# Patient Record
Sex: Male | Born: 1940
Health system: Southern US, Community
[De-identification: ages and names within clinical notes are randomized; demographics above are authoritative.]

## PROBLEM LIST (undated history)

## (undated) DIAGNOSIS — W19XXXA Unspecified fall, initial encounter: Secondary | ICD-10-CM

## (undated) DIAGNOSIS — S065X9A Traumatic subdural hemorrhage with loss of consciousness of unspecified duration, initial encounter: Secondary | ICD-10-CM

## (undated) DIAGNOSIS — I10 Essential (primary) hypertension: Secondary | ICD-10-CM

## (undated) DIAGNOSIS — I4729 Other ventricular tachycardia: Secondary | ICD-10-CM

## (undated) DIAGNOSIS — I5043 Acute on chronic combined systolic (congestive) and diastolic (congestive) heart failure: Secondary | ICD-10-CM

## (undated) DIAGNOSIS — B957 Other staphylococcus as the cause of diseases classified elsewhere: Secondary | ICD-10-CM

## (undated) DIAGNOSIS — U071 COVID-19: Secondary | ICD-10-CM

## (undated) DIAGNOSIS — E7800 Pure hypercholesterolemia, unspecified: Secondary | ICD-10-CM

## (undated) DIAGNOSIS — R7881 Bacteremia: Secondary | ICD-10-CM

## (undated) DIAGNOSIS — I472 Ventricular tachycardia: Secondary | ICD-10-CM

## (undated) DIAGNOSIS — N281 Cyst of kidney, acquired: Secondary | ICD-10-CM

## (undated) DIAGNOSIS — E119 Type 2 diabetes mellitus without complications: Secondary | ICD-10-CM

## (undated) DIAGNOSIS — G932 Benign intracranial hypertension: Secondary | ICD-10-CM

## (undated) DIAGNOSIS — I1311 Hypertensive heart and chronic kidney disease without heart failure, with stage 5 chronic kidney disease, or end stage renal disease: Secondary | ICD-10-CM

## (undated) HISTORY — DX: Other staphylococcus as the cause of diseases classified elsewhere: B95.7

## (undated) HISTORY — DX: Acute on chronic combined systolic (congestive) and diastolic (congestive) heart failure: I50.43

## (undated) HISTORY — DX: Chronic kidney disease, stage 5: I13.11

## (undated) HISTORY — DX: Other ventricular tachycardia: I47.29

## (undated) HISTORY — DX: Bacteremia: R78.81

## (undated) HISTORY — DX: Unspecified fall, initial encounter: W19.XXXA

## (undated) HISTORY — DX: COVID-19: U07.1

## (undated) HISTORY — DX: Ventricular tachycardia: I47.2

---

## 1998-05-02 ENCOUNTER — Emergency Department (HOSPITAL_COMMUNITY): Admission: EM | Admit: 1998-05-02 | Discharge: 1998-05-02 | Payer: Self-pay | Admitting: Emergency Medicine

## 2004-08-26 ENCOUNTER — Encounter: Admission: RE | Admit: 2004-08-26 | Discharge: 2004-08-26 | Payer: Self-pay | Admitting: Internal Medicine

## 2005-10-06 ENCOUNTER — Emergency Department (HOSPITAL_COMMUNITY): Admission: EM | Admit: 2005-10-06 | Discharge: 2005-10-06 | Payer: Self-pay | Admitting: Emergency Medicine

## 2006-09-27 ENCOUNTER — Emergency Department (HOSPITAL_COMMUNITY): Admission: EM | Admit: 2006-09-27 | Discharge: 2006-09-28 | Payer: Self-pay | Admitting: Emergency Medicine

## 2007-03-20 ENCOUNTER — Ambulatory Visit: Payer: Self-pay | Admitting: Cardiovascular Disease

## 2007-03-20 ENCOUNTER — Inpatient Hospital Stay (HOSPITAL_COMMUNITY): Admission: EM | Admit: 2007-03-20 | Discharge: 2007-03-22 | Payer: Self-pay | Admitting: Emergency Medicine

## 2007-03-21 ENCOUNTER — Ambulatory Visit: Payer: Self-pay | Admitting: Cardiology

## 2007-03-21 ENCOUNTER — Encounter: Payer: Self-pay | Admitting: Cardiovascular Disease

## 2007-03-22 ENCOUNTER — Other Ambulatory Visit: Payer: Self-pay | Admitting: Cardiology

## 2007-03-30 ENCOUNTER — Ambulatory Visit: Payer: Self-pay | Admitting: Cardiology

## 2007-03-30 ENCOUNTER — Observation Stay (HOSPITAL_COMMUNITY): Admission: EM | Admit: 2007-03-30 | Discharge: 2007-04-01 | Payer: Self-pay | Admitting: Emergency Medicine

## 2007-04-07 ENCOUNTER — Encounter (HOSPITAL_COMMUNITY): Admission: RE | Admit: 2007-04-07 | Discharge: 2007-05-13 | Payer: Self-pay | Admitting: Cardiology

## 2007-04-28 ENCOUNTER — Encounter: Admission: RE | Admit: 2007-04-28 | Discharge: 2007-04-28 | Payer: Self-pay | Admitting: Urology

## 2007-04-28 ENCOUNTER — Other Ambulatory Visit: Payer: Self-pay | Admitting: Urology

## 2007-04-29 ENCOUNTER — Encounter (INDEPENDENT_AMBULATORY_CARE_PROVIDER_SITE_OTHER): Payer: Self-pay | Admitting: Gastroenterology

## 2007-04-29 ENCOUNTER — Inpatient Hospital Stay (HOSPITAL_COMMUNITY): Admission: AD | Admit: 2007-04-29 | Discharge: 2007-05-01 | Payer: Self-pay | Admitting: Gastroenterology

## 2007-05-06 ENCOUNTER — Ambulatory Visit: Payer: Self-pay | Admitting: Internal Medicine

## 2010-11-03 ENCOUNTER — Encounter: Payer: Self-pay | Admitting: Urology

## 2011-02-24 NOTE — Cardiovascular Report (Signed)
NAMEKEANAN, BALAGOT             ACCOUNT NO.:  [id number]   MEDICAL RECORD NO.:  [id number]          PATIENT TYPE:  INP   LOCATION:  6526                         FACILITY:  [facility]   PHYSICIAN:  Ethelle Lyon, MD  DATE OF BIRTH:  September 03, 1941   DATE OF PROCEDURE:  03/30/2007  DATE OF DISCHARGE:                            CARDIAC CATHETERIZATION   PROCEDURE:  Coronary angiography.   INDICATIONS:  Mr. Buice is a 70 year old gentleman who underwent  placement of a drug-eluting stent in the right posterior left  ventricular branch on January 9.  He represented last evening with chest  pain.  Pain was, however, different than his prior presenting pain.  His  cardiac enzymes were normal as was his electrocardiogram.  He was  referred for diagnostic angiography to exclude threatened subacute stent  thrombosis.   PROCEDURAL TECHNIQUE:  Informed consent was obtained.  Underwent 1%  lidocaine local anesthesia, a 5-French sheath was placed in the left  common femoral artery using modified Seldinger technique.  Diagnostic  angiography was performed using JL-4 and JR-4 catheters.  The patient  tolerated the procedure well and was transferred to the holding room in  stable condition.  Sheath will be removed there.   COMPLICATIONS:  None.   FINDINGS:  1. Left main:  Long vessel with no stenosis.  2. LAD:  Moderate-sized vessel giving rise to two diagonals.  The      ostium has a 50% stenosis by angiography.  (by intravascular      ultrasound last week it was a 30% area stenosis with a residual      area of 6.6 square millimeters).  3. Ramus intermedius:  Moderate-sized vessel with an approximately 60%      stenosis proximally.  4. Circumflex:  Moderate-sized vessel giving rise to a single      marginal.  It is angiographically normal.  5. RCA:  Tortuous, dominant vessel.  Widely patent stent in the      posterior left ventricular branch.   IMPRESSION/PLAN:  No change in his anatomy  compared with a week ago.  Widely patent drug-eluting stent in the right posterior left ventricular  branch and no change in the moderate stenosis of the LAD and ramus  intermedius.  Will discharge him home today with no change in his  medical therapy.      Ethelle Lyon, MD  Electronically Signed     WED/MEDQ  D:  03/30/2007  T:  03/30/2007  Job:  [id number]

## 2011-02-24 NOTE — H&P (Signed)
William [name]             ACCOUNT NO.:  [id number]   MEDICAL RECORD NO.:  [id number]          [name] TYPE:  INP   LOCATION:  [id number]                         FACILITY:  [facility]   PHYSICIAN:  Crista Elliot, MD     DATE OF BIRTH:  1941/01/26   DATE OF PROCEDURE:  DATE OF DISCHARGE:                    STAT - MUST CHANGE TO CORRECT WORK TYPE   PRIMARY CARE PHYSICIAN:  Dr. Willey Blade.   CARDIOLOGIST:  Dr. Jenkins Rouge with [facility].   CHIEF COMPLAINT:  Lower chest and upper abdominal pain.   William [name] is a 70 year old African-American male with a history of  diabetes, hyperlipidemia, hypertension, and coronary artery disease  status post PTCI to William posterior LV branch on March 22, 2007.  Presents  with upper epigastric/lower sternal chest pain described as a dull  aching for William last 4 hours.  William [name] states that his symptoms are  identical to his symptoms 1 week ago when he had William positive cardiac  catheterization and underwent angioplasty.  William [name] denies any  exertional component as he has not been exerting himself since William cath.  He was lying down and reading when a 6 out of 10 dull aching started in  William upper epigastrium/lower sternal area.  He took 1 nitroglycerin with  no relief.  EMS was subsequently called, and a second nitroglycerin was  taken 30 minutes later with mild improvement down to a 2 out of 10.  William  [name] denied any metallic taste, burping sensation, left chest pain,  orthopnea, paroxysmal nocturnal dyspnea, or syncope.  He states that  upon exertion he has no symptoms that are reproducible.   PAST MEDICAL HISTORY:  1. Coronary artery disease.      a.     Presumed unstable angina on March 20, 2007.  Status post heart       catheterization with a 2.5 x 16 mm Taxus stent to William posterior LV       branch with a reduction of lesion from 70% to 0.      b.     Nonobstructive disease was also noted with a 50% lesion in       William ostial  LAD that underwent IVUS showing a 34% lesion with a 6.6       mm2 area.  A 60% proximal ramus was also noted and felt that it       could be medically managed.  2. Diabetes mellitus.  3. Hypertension.  4. Mild chronic renal insufficiency with a creatinine of 1.6.  5. Hyperlipidemia.   ALLERGIES:  No known drug allergies. Adverse reactions are to glucose  making William [name] sleepy.   MEDICATIONS:  1. Plavix 75 mg p.o. daily.  2. Aspirin 325 mg p.o. daily.  3. Toprol-XL 50 mg p.o. daily.  4. Crestor 10 mg p.o. daily.  5. Benicar 20 mg p.o. daily.  6. Actos 15 mg p.o. daily.  7. Nitroglycerin p.r.n.   SOCIAL HISTORY:  William [name] lives in [place] to be near his daughter  who is dependent on him.  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  He is widowed, with his wife expiring from a  massive heart attack.  He is a lifetime nonsmoker.   FAMILY HISTORY:  Notable for mother who died of old age at age 43, and  father was killed at a young age when William [name] was only 70 year old.   REVIEW OF SYSTEMS:  William 12 review of systems were reviewed and were  negative.   PHYSICAL EXAMINATION:  VITAL SIGNS:  Temperature 98.3, pulse 78,  respiratory rate 16, blood pressure 157/86, satting 98% on room air.  GENERAL:  William [name] is awake, alert, and oriented x3, in no acute  distress.  HEENT:  Normocephalic, atraumatic.  Pupils equal, round, and reactive to  light.  Extraocular movements are intact.  NECK:  Shows no JVD but does show bilateral carotid bruits.  CARDIOVASCULAR:  Regular rhythm, normal rate.  No murmurs, rubs, or  gallops.  LUNGS:  Clear to auscultation bilaterally.  ABDOMEN:  Positive bowel sounds.  Soft, nontender, nondistended.  No  epigastric tenderness with deep palpation.  No Murphy sign.  EXTREMITIES:  Show no clubbing, cyanosis, or edema.  Right femoral  catheter site appears  well healed with no bruits.  NEURO:  His cranial nerves, II through XII, are grossly intact.  No  focal __________  or sensory deficits.  MUSCULOSKELETAL:  Demonstrates no joint deformities or effusions.  SKIN:  Demonstrates no rashes or lesions.   Chest x-ray demonstrates no acute cardiopulmonary process.  EKG  demonstrates a normal sinus rhythm with first-degree AV block and a  heart rate of 60, with left axis deviation and nonspecific T wave  abnormalities.  White count is 6.9, hemoglobin is 13.9, hematocrit is  41.2, platelet count of 350,000.  Creatinine is 1.6.  Troponin is less  than 0.05, CK is 45, MB is 1.   ASSESSMENT AND PLAN:  This is a 70 year old African-American male with  multiple risk factors and a recent percutaneous transluminal coronary  intervention 1 week ago who presents with symptoms that are not typical  for angina.  1. Acute coronary syndrome.  At this time, we will continue to rule      out William [name] for myocardial infarction.  He has received Lovenox      in William emergency room.  We will review William cardiac cath films and      evaluate William ramus lesion as to William necessity for repeat      angioplasty versus medical management and evaluation for noncardiac      causes of William [name]'s symptoms.  2. Gastroesophageal reflux disease.  William [name]'s symptoms may be GI      related, therefore William [name] will be started on Protonix, and      amylase and lipase will also be obtained to rule out any pancreatic      or bile duct diseases.  3. Diabetes.  William [name] will be continued on his Actos.  Sliding      scale insulin will be initiated.     Crista Elliot, MD  Electronically Signed    RA/MEDQ  D:  03/30/2007  T:  03/30/2007  Job:  [id number]

## 2011-02-24 NOTE — Discharge Summary (Signed)
NAMESHANNEN, TOBEN             ACCOUNT NO.:  [id number]   MEDICAL RECORD NO.:  [id number]          PATIENT TYPE:  INP   LOCATION:  [id number]                         FACILITY:  [facility]   PHYSICIAN:  [name], MD    DATE OF BIRTH:  [birth year]   DATE OF ADMISSION:  04/29/2007  DATE OF DISCHARGE:  05/01/2007                               DISCHARGE SUMMARY   ADMISSION DIAGNOSES:  1. Epigastric pain.  2. Anemia.  3. Melena.  4. Coronary artery disease status post stent placement.  5. Hypertension.  6. Diabetes.  7. Hyperlipidemia.   DISCHARGE DIAGNOSES:  1. Duodenal ulcer.  2. Coronary artery disease.  3. Hyperlipidemia.  4. Diabetes.  5. Hypertension.   HISTORY AND PHYSICAL:  Please see the original H&P for full details.   HOSPITAL COURSE:  The patient underwent an emergent EGD upon admission  to the hospital and is noted to have a 1-cm duodenal ulcer. It was also  associated with gastritis. The gastritis was biopsies and the  presumption is that the patient has H. Pylori as it gave the gross  characteristic appearance of this type of infection. The duodenal ulcer  was noted to be clean based however, at the edges it was quite friable  with minor trauma.  The patient has been on both aspirin and Plavix. The  patient tolerated the procedure well and no complications were  encountered at this time. The patient was started on Protonix b.i.d. as  well as amoxicillin 1 gram p.o. b.i.d. and erythromycin 500 mg p.o.  b.i.d. The patient also consented to undergoing a blood transfusion and  his final hemoglobin value was noted to be at 10.9 upon discharge. The  patient tolerated the blood transfusions without any difficulty and his  pain was improved.  No further evidence of any melena.  The patient was  subsequently discharged in good condition and he is to follow-up in the  office in 2 weeks.      Tory Emerald Benson Norway, MD  Electronically Signed     PDH/MEDQ  D:  05/01/2007  T:   05/02/2007  Job:  [id number]

## 2011-02-24 NOTE — Discharge Summary (Signed)
NAMEELDIN, NEIDICH             ACCOUNT NO.:  [id number]   MEDICAL RECORD NO.:  [id number]          PATIENT TYPE:  INP   LOCATION:  [id number]                         FACILITY:  [facility]   PHYSICIAN:  Ethelle Lyon, MD  DATE OF BIRTH:  12/27/1940   DATE OF ADMISSION:  03/19/2007  DATE OF DISCHARGE:  03/22/2007                               DISCHARGE SUMMARY   PRIMARY CARDIOLOGIST:  He has noted Dr. Jenkins Rouge.   PRIMARY CARE PHYSICIAN:  Dr. Willey Blade.   CHIEF COMPLAINT:  Chest pain.   DISCHARGE DIAGNOSES:  1. Coronary artery disease.      a.     Admitted for unstable angina pectoris - myocardial       infarction ruled out.      b.     Status post Taxus drug-eluting stent placement to the       posterior left ventricular branch.      c.     Residual moderate nonobstructive disease - treated       medically.  2. Preserved LV function with EF of 60%.  3. Diabetes mellitus.  Actos initiated this admission.  4. Treated hyperlipidemia.  5. Hypertension.  6. Mild chronic renal insufficiency, creatinine at discharge 1.62.   PROCEDURE:  This admission, cardiac catheterization, percutaneous  coronary intervention by Dr. Albertine Patricia March 19, 2007.  Please see his  dictated note for complete details.  As noted above, the patient  underwent TAXUS drug-eluting stent placement to the posterior left  ventricular branch.  His residual CAD included 50% ostial LAD stenosis  which was 34% by IVUS, 60% proximal stenosis of the moderate size ramus  intermedius.   HISTORY OF PRESENT ILLNESS:  William [name] is a 70 year old male patient  with a history of newly diagnosed diabetes mellitus, hypertension,  hyperlipidemia who presented to [facility] on the day of  admission with complaints of chest pain off and on for 3 days.  He ruled  out for myocardial infarction and our service was asked to consult.  He  was initially transferred to our service.  His symptoms were concerning  for  unstable angina pectoris and he was set up for cardiac  catheterization.   HOSPITAL COURSE:  The patient was noted to have an elevated creatinine  on admission.  He was hydrated.  He was set up for cardiac  catheterization.  This was done on June 9 by Dr. Albertine Patricia.  As noted  above, he underwent Taxus drug eluding stent placement of the posterior  left ventricular branch.  He tolerated procedure well and without any  complications.  On the morning of March 22, 2007, he did have some groin  oozing.  This was treated with subcutaneous lidocaine with epinephrine.  This resolved the oozing.  He did not have a left ventriculogram at the  time of his catheterization due to his renal insufficiency.  A 2-D  echocardiogram revealed normal LV function with an EF of 60%.  LV wall  thickness was mildly increased.  The patient was doing well and was felt  that he was stable enough for  discharge to home.  He is seen by the  diabetes educator.  He was also set up for outpatient cardiac rehab.  The hospital service who initially admitted him placed him on Actos for  better control of his diabetes mellitus.  The patient had stopped his  Sulfonylurea asan outpatient secondary to hypoglycemia.  He was kept on  his Benicar and Crestor.  Toprol was added to his medical regimen.  Given his TAXUS drug-eluting stent placement, he needs aspirin  indefinitely and Plavix for greater than or equal to 1 year.   LABORATORY DATA:  White count 7500, hemoglobin 14.8, hematocrit 43.6,  platelet count 303,000, INR 0.9, sodium 137, potassium 4.2, glucose 94,  BUN 16, creatinine 1.2.  Cardiac markers negative x3.  TSH 1.230, total  cholesterol 151, triglycerides 106, HDL 32, LDL 98.  A 2-D  echocardiogram as noted above.  Chest X-Ray on March 19, 2007:  No acute  cardiopulmonary findings, stable appearance of chest 2005.  Of note,  there was a stable density noted near the anterior 1st rib of the left.   DISCHARGE  MEDICATIONS:  1. Plavix 75 mg daily.  2. Aspirin 325 mg daily.  3. Toprol XL 50 mg daily.  4. Crestor 10 mg daily.  5. Benicar 20 mg daily.  6. Actos 15 mg daily.  7. Nitroglycerin p.r.n. chest pain.   DISCHARGE INSTRUCTIONS:  1. Diet: Low-fat, low-sodium diabetic diet.  2. Wound care: The patient is to call our office for groin swelling,      bleeding, bruising or fever.   ACTIVITY:  The patient is to increase activity slowly.  He may walk up  steps.  May shower.  He is to do no lifting, driving or sexual activity  for 3 days.  He can return to work on or after March 29, 2007.   FOLLOW UP:  1. The patient has been set up for a follow-up with the physician      assistant for Dr. Johnsie Cancel on June 25 at 9:45 a.m..  2. He should contact Dr. [name] for follow-up.  He will need follow-      up on his diabetes with the initiation of Actos.  He also need      follow-up on his cholesterol.  His goal LDL is now less than 70.      Adjustments in his Crestor dose versus addition of another agent      can be managed by his primary care physician who manages his      cholesterol.  Total physician PA time greater than 30 minutes spent      on discharge.      Richardson Dopp, PA-C      Ethelle Lyon, MD  Electronically Signed    SW/MEDQ  D:  03/22/2007  T:  03/23/2007  Job:  [id number]   cc:   Royetta Crochet. [name], M.D.

## 2011-02-24 NOTE — H&P (Signed)
William [name], William [name]             ACCOUNT NO.:  [id number]   MEDICAL RECORD NO.:  [id number]          PATIENT TYPE:  INP   LOCATION:  73                         FACILITY:  [facility]   PHYSICIAN:  Jana Hakim, M.D. DATE OF BIRTH:  1940/12/16   DATE OF ADMISSION:  03/19/2007  DATE OF DISCHARGE:                              HISTORY & PHYSICAL   PRIMARY CARE PHYSICIAN:  Dr. Willey Blade   CHIEF COMPLAINT:  Chest pain.   HISTORY OF PRESENT ILLNESS:  This is a 70 year old male presenting to  the emergency department with complaints of unremitting chest pain,  substernal in location.  He reports having chest pain off and on for 3  days and the pain would resolve with resting.  He reports the pain began  again this evening and was unremitting until he arrived in the emergency  department.  He reports the pain felt like heaviness in the substernal  area, was without radiation and rated the intensity as being a 5/10 at  the worst.  He denies having any associated symptoms of nausea,  vomiting, shortness of breath, diaphoresis.  He denies having headache  or syncope or dizziness associated as well.   PAST MEDICAL HISTORY:  Hypertension, hyperlipidemia.   PAST SURGICAL HISTORY:  None.   MEDICATIONS:  Crestor  20 mg one p.o. daily, Benicar 20 mg one p.o.  daily   ALLERGIES:  NO KNOWN DRUG ALLERGIES.   SOCIAL HISTORY:  The patient is a high Education officer, museum.  He is a  nonsmoker, nondrinker.   FAMILY HISTORY:  Noncontributory.   PHYSICAL EXAMINATION FINDINGS:  A 70 year old obese male in no acute  distress.  Vital Signs: Temperature 97.8, blood pressure 146/97, heart  rate 80, respirations 16, O2 saturations 97%.  HEENT: Normocephalic, atraumatic.  Pupils equally round and reactive to  light.  Extraocular muscles are intact, funduscopic benign, oropharynx  is clear.  NECK:  Supple, full range of motion.  No thyromegaly, adenopathy,  jugular venous distension.  CARDIOVASCULAR:   Regular rate and rhythm.  No murmurs, gallops or rubs.  LUNGS: Clear to auscultation bilaterally.  No rales, rhonchi or wheezes  ABDOMEN:  Positive bowel sounds, soft, nontender, nondistended.  No  hepatosplenomegaly.  No rebound, no guarding.  EXTREMITIES: Without cyanosis, clubbing or edema.  NEUROLOGIC EXAMINATION:  Alert and oriented x3.  There are no focal  deficits.   LABORATORY STUDIES:  White blood cell count 8.4, hemoglobin 16.0,  hematocrit 47.2, MCV 95.8, platelets 337, neutrophils 75% lymphocytes  17%.  Sodium 139, potassium 4.2, chloride 104, CO2 27, BUN 23,  creatinine 1.7, glucose 215.  Cardiac enzymes, myoglobin 81.5, CK-MB  1.8, troponin less than 0.05  Chest x-ray:  No acute disease process.  EKG normal sinus rhythm without  acute ST-segment changes.   ASSESSMENT:  65. A 70 year old male being admitted with substernal chest pain.  2. Hypertension.  3. Hyperglycemia  4. Hyperlipidemia.   PLAN:  The patient will be admitted to the telemetry area for cardiac  monitoring and cardiac enzymes will be performed q.8 h x3.  The patient  will be placed on DVT  and GI prophylaxis for now.  The patient will also  be placed on sliding scale insulin coverage for elevated blood sugars.  He will continue on his regular medications and nutritional education  will be ordered.      Jana Hakim, M.D.  Electronically Signed     HJ/MEDQ  D:  03/20/2007  T:  03/20/2007  Job:  [id number]   cc:   Royetta Crochet. [name], M.D.  Fax: [phone]

## 2011-02-24 NOTE — Consult Note (Signed)
NAMECAIMEN, BLECHINGER             ACCOUNT NO.:  [id number]   MEDICAL RECORD NO.:  [id number]          PATIENT TYPE:  INP   LOCATION:  6526                         FACILITY:  [facility]   PHYSICIAN:  [name], MD    DATE OF BIRTH:  [birth year]   DATE OF CONSULTATION:  03/30/2007  DATE OF DISCHARGE:                                 CONSULTATION   PRIMARY CARE PHYSICIAN:  Dr. Willey Blade.   HISTORY OF PRESENT ILLNESS:  This is a 70 year old gentleman with a past  medical history of hyperlipidemia, hypertension, diabetes, and coronary  artery disease who is status post PTCA with stent placement on March 22, 2007 and subsequent repeat cardiac catheterization for complaints of  substernal chest pain/epigastric pain.  The patient states that prior to  the first cardiac catheterization, he experienced acute onset of  abdominal pain.  He denies having any of these prior episodes in the  past.  The pain was sharp, and it was located in the epigastric region.  When the pain is at its worst there is radiation to his back.  He  subsequently underwent a cardiac catheterization as it was felt that he  may be having  cardiac ischemia.  The catheterization did reveal a 74-  70% lesion in the right coronary artery, and subsequently a Taxus stent  was placed without difficulty.  The patient did report an improvement in  his pain for one day.  However, the pain did recur at that time.  He has  been discharged, and subsequently the pain was episodic. Unfortunately  the pain did increase back to the same severity as his prior initial  cardiac stent and because of these type of concerns, a repeat cardiac  catheterization was performed on March 30, 2007.  The findings were a  patent stent and no evidence of any thrombosis within his coronary  arteries.  An amylase and lipase were ordered on the patient, and his  amylase was noted to be elevated at 200 and his lipase at 60.  He denies  having any prior  history of alcohol use and no new medications.  He was  diagnosed with diabetes approximately one year ago, and he was started  on glipizide at that time.  His blood sugars had dropped significantly,  and therefore he was taken off of the medication and subsequently is  diet-controlled.  He denies any family history of diabetes.  The pain is  not brought about by any p.o. intake, and at points it can subside on  its own.   PAST MEDICAL HISTORY/PAST SURGICAL HISTORY:  As stated above.   FAMILY HISTORY:  Noncontributory.   ALLERGIES:  No known drug allergies.   MEDICATIONS:  1. Plavix 75 mg p.o. daily.  2. Aspirin 325 mg p.o. daily.  3. Toprol XL 50 mg p.o. daily.  4. Crestor 10 mg p.o. daily.  5. Benicar 20 mg p.o. daily.  6. Actos 50 mg p.o. daily.  7. Nitroglycerin p.r.n..   SOCIAL HISTORY:  The patient is a Conservation officer, nature at [employer].  No  alcohol, tobacco or illicit  drug use.   REVIEW OF SYSTEMS:  Negative per the 12-point Review of Systems.   PHYSICAL EXAMINATION:  VITAL SIGNS:  Blood pressure 165/81, heart rate  57, temperature 98.1, respirations 17, pulse ox 97% on room air.  GENERAL:  The patient is in no acute distress, alert and oriented.  HEENT:  Normocephalic, atraumatic.  Extraocular muscles intact.  NECK:  Supple.  No lymphadenopathy.  LUNGS:  Clear to auscultation bilaterally.  CARDIOVASCULAR:  Regular rate and rhythm.  ABDOMEN:  Flat, mildly obese.  Soft, nontender, nondistended.  No  rebound or rigidity.  Positive bowel sounds.  EXTREMITIES:  No clubbing,  cyanosis or edema.   LABORATORY VALUES:  White blood cell count 6.9, hemoglobin 14.3, MCV  96.5, platelets 350.  Sodium 137, potassium 2.8, chloride 107, CO2 26,  glucose 152, BUN 17, creatinine 1.4.  Amylase 209, lipase 60.   IMPRESSION:  1. Epigastric/substernal chest pain.  2. Coronary artery disease.  After evaluation of patient in      combination with his clinical presentation and the  elevation in his      amylase and lipase, it is consistent with pancreatitis.  I am      uncertain about the cause of the pancreatitis at this time.  The      most common causes are alcohol and gallstones.  There is no      evidence of any obstruction in his biliary tract, and the patient      does not have any history of alcohol use.  One concern is that      patients who present with occult pancreatitis and a recent history      of diabetes can present with a pancreatic cancer.  Further      examination with the CT scan is required at this time.   PLAN:  To perform a CT scan of the abdomen and pelvis, and the patient  can be advanced to a clear liquid diet.      Tory Emerald Benson Norway, MD  Electronically Signed     PDH/MEDQ  D:  03/30/2007  T:  03/31/2007  Job:  [id number]   cc:   Royetta Crochet. [name], M.D.  [name], MD, [facility]

## 2011-02-24 NOTE — H&P (Signed)
William [name], William [name]             ACCOUNT NO.:  [id number]   MEDICAL RECORD NO.:  [id number]          PATIENT TYPE:  INP   LOCATION:  [id number]                         FACILITY:  [facility]   PHYSICIAN:  [name], MD    DATE OF BIRTH:  Aug 10, 1941   DATE OF ADMISSION:  04/29/2007  DATE OF DISCHARGE:  05/01/2007                              HISTORY & PHYSICAL   REASON FOR ADMISSION:  Epigastric pain, heme-positive stool and anemia.   PRIMARY CARE Tieara Flitton:  Dr. Willey Blade.   CARDIOLOGIST:  Dr. Jenkins Rouge   HISTORY OF PRESENT ILLNESS:  This is a 70 year old gentleman with known  coronary artery disease status post stenting on March 20, 2007 with  recatheterization for the presumption of an acute occlusion of his Taxus  stent, diabetes, hypertension, hyperlipidemia who is being admitted to  the hospital with findings of an acute anemia. The patient was  previously known to have a hemoglobin in the 14 range and subsequently  over the subsequent weeks since his discharge, he has noted to have an  acute drop to 8.8.  The patient complains of persistent epigastric  abdominal pain and because of his symptoms he was admitted back to the  hospital for further evaluation and treatment. Previously a GI  consultation was requested in regards to his elevation in his amylase  and a CT scan which suggested that he may have a pancreatitis. An MRI  was performed and there are thoughts that an occult pancreatic  malignancy can result in a pancreatitis; however, the MRI was negative  for any masses or any evidence of pancreatitis.  The patient states that  his melena was recent onset as it started approximately 1 week prior to  this admission.   PAST MEDICAL HISTORY:  As stated above.   PAST SURGICAL HISTORY:  As stated above.   FAMILY HISTORY:  Noncontributory.   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   REVIEW  OF SYSTEMS:  As stated above in the history of present illness,  otherwise negative per the 12 point review of systems.   MEDICATIONS:  1. Plavix 75 mg p.o. daily.  2. Aspirin 325 mg p.o. daily.  3. Toprol XL 50 mg p.o. daily.  4. Crestor 10 mg p.o. daily.  5. Benicar 20 mg p.o. daily.  6. Actos 50 mg p.o. daily.  7. Sublingual nitroglycerin p.r.n.   PHYSICAL EXAMINATION:  VITAL SIGNS:  Blood pressure is 133/65, heart  rate is 62, temperature is 97.  GENERAL:  The patient is in no acute distress.  HEENT:  Normocephalic, atraumatic.  Extraocular muscles intact.  NECK:  Supple.  No lymphadenopathy.  LUNGS:  Clear to auscultation bilaterally.  CARDIOVASCULAR:  Regular rate and rhythm.  ABDOMEN:  Flat, soft, nontender, nondistended.  It is tender in the  epigastric region.  No rebound or rigidity.  EXTREMITIES:  No clubbing, cyanosis or edema.   LABORATORY VALUES:  White blood cell count is 8.3, hemoglobin 9.0,  platelets at 422, MCV is 98.0.  Sodium 139,  potassium 3.9, chloride is  110, CO2 23, glucose 87, BUN is 13, creatinine 1.4.   IMPRESSION:  1. Epigastric pain, question anemia and heme-positive stool.  2. Coronary artery disease status post Taxus stent placement.  3. Hyperlipidemia.  4. Diabetes.  5. Hypertension.  After evaluation of the patient, it appears that the      patient has a GI source for his abdominal pains most likely and      ulcers as he is noted to have a drop in his hemoglobin associated      with heme-positive stool. The plan will be to admit the patient,      transfuse with 2 units of packed red blood cells and then perform      an emergent EGD.      Tory Emerald Benson Norway, MD  Electronically Signed     PDH/MEDQ  D:  05/01/2007  T:  05/02/2007  Job:  [id number]   cc:   Royetta Crochet. [name], M.D.  [name], MD, [facility]

## 2011-02-24 NOTE — Cardiovascular Report (Signed)
William [name], William [name]             ACCOUNT NO.:  [id number]   MEDICAL RECORD NO.:  [id number]          PATIENT TYPE:  INP   LOCATION:  [id number]                         FACILITY:  [facility]   PHYSICIAN:  Ethelle Lyon, MD  DATE OF BIRTH:  1941/01/20   DATE OF PROCEDURE:  03/21/2007  DATE OF DISCHARGE:                            CARDIAC CATHETERIZATION   PROCEDURE:  Left heart catheterization, left ventriculography, coronary  angiography, intravascular ultrasound of the LAD, drug-eluting stent  placement in the right posterior left ventricular branch, StarClose  closure of the right common femoral arteriotomy site.   INDICATION:  William [name] is a 70 year old gentleman with hypertension and  hypercholesterolemia who presents with unstable angina.  He ruled out  for myocardial infarction by serial enzymes and electrocardiograms.  He  is referred for diagnostic angiography and possible percutaneous  coronary intervention.   PROCEDURAL TECHNIQUE:  Informed consent was obtained.  Under 1%  lidocaine local anesthesia, a 5-French sheath was placed in the right  common femoral artery using the modified Seldinger technique.  Diagnostic angiography and ventriculography were performed using JL-4,  JR-4, and pigtail catheters.  These images demonstrated at least  moderate stenosis of the ostium of the left anterior descending artery  as well as a 70% stenosis of the right posterior left ventricular  branch.  We decided to proceed to intravascular ultrasound interrogation  of the LAD and treatment of the RCA.   Anticoagulation was initiated with bivalirudin.  ACT was confirmed to be  greater than 225 seconds.  Plavix 600 mg was administered.  The patient  had been preloaded with aspirin.   The sheath was upsized over a wire to 6-French.  A 6-French Q guide was  advanced over a wire and engaged in the ostium of the left main.  A  Prowater wire was advanced to the distal LAD without difficulty.  I  then  performed intravascular ultrasound using the Atlantis probe device and  automated pullback.  These images demonstrated the area of stenosis to  only be 34% with a residual area of 6.6 sqmm.  We elected to manage it  conservatively.  Guide and wire were removed.   The attention was then turned to the RCA.  A JR-4 guide was advanced  over a wire and engaged in the ostium of the RCA.  A Prowater wire was  advanced to the distal PLV without difficulty.  I directly stented the  lesion using a 2.5 x 16-mm Taxus deployed at 14 atmospheres.  I  postdilated the stent using a 2.5 x 12-mm Quantum at 18 atmospheres for  two inflations.  Final angiography demonstrated no residual stenosis, no  dissection, and TIMI 3 flow to the distal vasculature.   The arteriotomy was then closed using a StarClose device.  Complete  hemostasis was obtained.  He was then transferred to the holding room in  stable condition having tolerated the procedure well.   COMPLICATIONS:  None.   FINDINGS:  1. LV:  143/6/16.  EF 65% without regional wall motion abnormality.  2. No aortic stenosis or mitral regurgitation.  3. Left main:  Long vessel which has some mild plaquing by      intravascular ultrasound but no significant stenosis.  4. LAD:  Moderate-sized vessel giving rise to two diagonals.  The      ostium has approximately 50% stenosis by angiography.  By      intravascular ultrasound there is a 34% area of stenosis with a      residual area of 6.6 sqmm.  5. Ramus intermedius:  Moderate-sized vessel with approximately 60%      stenosis proximally.  6. Circumflex:  Moderate-sized vessel giving rise to a single      marginal.  It is angiographically normal.  7. RCA:  Tortuous, dominant vessel.  The large posterior left      ventricular branch had a 70% stenosis stented to no residual.   IMPRESSION/PLAN:  Successful percutaneous intervention on the posterior  left ventricular branch using a drug-eluting  stent.  Would recommend  aspirin indefinitely.  Plavix should be continued for a minimum of 1  year with consideration to indefinite Plavix.      Ethelle Lyon, MD  Electronically Signed     WED/MEDQ  D:  03/21/2007  T:  03/22/2007  Job:  [id number]

## 2011-02-24 NOTE — Discharge Summary (Signed)
NAMERENAULT, William             ACCOUNT NO.:  [id number]   MEDICAL RECORD NO.:  [id number]          PATIENT TYPE:  INP   LOCATION:  6526                         FACILITY:  [facility]   PHYSICIAN:  [name], MD, FACCDATE OF BIRTH:  Apr 09, 1941   DATE OF ADMISSION:  03/30/2007  DATE OF DISCHARGE:  04/01/2007                               DISCHARGE SUMMARY   PROCEDURES PERFORMED DURING HOSPITALIZATION:  1. Cardiac catheterization performed on March 30, 2007 per Dr. Albertine Patricia.      a.     Left main long vessel, no stenosis.  Left anterior       descending moderate size vessel giving rise to 2 diagonals.  The       ostium has 50% stenosis, by angiography by intravascular       ultrasound last week it was 30% area stenosis with residual area       of 6.6 square millimeters.  Ramus intermedius, moderate size       vessel with approximately 60% stenosis proximally.  Circumflex,       moderate size vessel giving rise to a single marginal that is       angiographically normal.  Right coronary artery tortuous dominant       vessel widely patent stent in the posterior left ventricular       branch.  No change in anatomy compared to 1 week prior.  Widely       patent drug-eluting stent is in the right posterior left       ventricular branch with no change in moderate stenosis of the left       anterior descending and ramus intermediate.  2. MRI of the abdomen with results pending.   CONSULTING PHYSICIAN:  Dr. Carol Ada, GI.   PRIMARY CARE PHYSICIAN:  Dr. Willey Blade.   DISCHARGE DIAGNOSES:  1. Pancreatitis.  2. Coronary artery disease, status post TAXUS stent to the right      coronary artery.  3. Noncardiac chest pain.  4. Hyperlipidemia.  5. Hypertension.  6. Diabetes.   HOSPITAL COURSE:  This is a 70 year old male with a history as stated  above who presented to [facility] Emergency Room secondary to recurrent  chest pain, which he described as sharp, located in the  epigastric  region.  The pain was worse with radiation to the back.  The patient had  cardiac catheterization on March 22, 2007 with stent placed to the right  coronary artery.  The pain reoccurred after stent was placed with  increase in intensity and same severity as prior to cardiac  catheterization and stent placement.   Patient subsequently went to cardiac catheterization lab and had cardiac  catheterization as described above by Dr. Albertine Patricia with no change in  coronary anatomy.  It was also found that the patient's amylase and  lipase were elevated with diagnosis of pancreatitis.  Dr. Benson Norway was  consulted concerning this.  An MRI was completed on April 01, 2007 prior  to discharge.  The results of MRI are pending at time of this dictation.  Dr. Benson Norway was concerned about  occult malignancy at the head of the  pancreas.  An MRI was to help determine this.   On day of discharge, patient was seen and examined by Dr. Jenkins Rouge.  Patient was found to be pain free at present and was anxious to go home.  The patient will be seen by Dr. Benson Norway prior to discharge with further  recommendations after he views MRI results and will followup with Dr.  Benson Norway as an outpatient.  The patient will continue with his current  medication regimen prior to admission and followup with Dr. Johnsie Cancel in 2-  3 months.   DISCHARGE LABS:  Troponin negative x3.  Cholesterol 190, triglycerides  133, HDL 35, LDL 128.  Hemoglobin 14.0, hematocrit 41.6, white blood  cell 6.2, platelets 342.  Amylase elevated at 209.  Hemoglobin A1c  elevated at 8.3.  Sodium 137, potassium 4.0, chloride 105, BUN 19,  glucose 162.  Hematocrit 42.0, hemoglobin 14.3, platelets 350, white  blood cells 6.9.   Vital signs:  Blood pressure 134/52, heart rate 54, respirations 18,  temperature 98.1, O2 saturation 97% on room air.   DISCHARGE MEDICATIONS:  1. Plavix 75 mg daily.  2. Aspirin 325 daily.  3. Toprol 50 mg daily.  4. Crestor 10 mg  daily.  5. Actos 15 mg daily.  6. Protonix 40 mg daily.  7. Benicar 20 mg daily.   ALLERGIES:  NO KNOWN DRUG ALLERGIES.   FOLLOWUP PLANS AND APPOINTMENT:  1. The patient is to be followed by Dr. Johnsie Cancel on June 25 at 9:45 a.m.  2. The patient will followup with his primary care physician, Dr.      [name].  3. The patient will followup with Dr. Benson Norway, a GI specialist for      continued medical management of pancreatitis.  4. The patient has been given cardiac catheterization post procedure      instructions with particular emphasis on right groin site for      evidence of bleeding, hematoma or infection.   Time spent with the patient, to include physician time, 35 minutes.      [name], NP      [name], MD, [facility]  Electronically Signed    KML/MEDQ  D:  04/01/2007  T:  04/01/2007  Job:  [id number]   cc:   [name], Media. [name], M.D.

## 2011-02-24 NOTE — H&P (Signed)
NAMEJARYAN, MCKINZIE             ACCOUNT NO.:  [id number]   MEDICAL RECORD NO.:  [id number]          PATIENT TYPE:  INP   LOCATION:  [place]                         FACILITY:  [facility]   PHYSICIAN:  [name], MD, FACCDATE OF BIRTH:  [birth year]   DATE OF ADMISSION:  03/19/2007  DATE OF DISCHARGE:                              HISTORY & PHYSICAL   Mr. Goscinski is a 70 year old patient admitted by [facility] for chest pain.   The patient has no history of coronary artery disease.  He used to be a  Freight forwarder at [employer] and tells me that he had yearly exercise treadmills  which were fine.  He has not had one in over 4 years.  The patient has  coronary risk factors which include hypertension, hyperlipidemia and  possibly borderline diabetes.   He has been having pain for about 3 days, it has been off and on.  It is  relieved with rest and sleeping.  It is not necessarily exertional,  however, it returned today and was somewhat unrelenting, lasting over an  hour or two.  He rated it a 5/10.  There was no associated shortness of  breath or diaphoresis.  There was no syncope, nausea or vomiting.  The  patient has been under some stress recently.  Apparently there are some  law suits involved.  The pain is increasing in frequency and severity so  he came to the emergency room.   He is currently having just a little bit of substernal pain and the  nurse apparently just gave him some IV pain medication.   [place]:  HTN Hypercholesterolemia and possible DM   REVIEW OF SYSTEMS:  Otherwise remarkable for being a nonsmoker.  He has  not had any fevers.  He has not had any recent trauma.  There is no  history of arthritis or musculoskeletal problems.  Review of systems  otherwise negative.   He has been on blood pressure and cholesterol medicine for the last 4-5  years, he cannot tell me what they are.  He says that too much glucose  makes him sleepy and that is only intolerance.   FAMILY HISTORY:   Unremarkable, his mother died of old age at 18 and his  father was killed at a young age when he was only a year old and he does  not recall the event.   The patient is widowed.  His wife died of a massive heart attack.  Apparently she was admitted to the hospital and died later that day.  This makes Mr. Grotte extremely anxious about his pain and he  particularly wants to know for sure whether he has blockages or not.  He  does have an 94 year old daughter who depends on him.  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 and moved here.  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   He has not had any previous surgeries.   PRIMARY CARE PHYSICIAN:  Dr. Heath Gold.  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  I suspect he has been on and off ACE-inhibitor  with his hypertension.   ON EXAMINATION:  GENERAL: He is a healthy-appearing middle-aged black  male, in no distress.  He did just get some sedating pain medicine.  VITAL SIGNS: Remarkable for blood pressure of 122/78, pulse is 60 and  regular, respiratory rate is 20, sats are 100% on room air.  He is  afebrile.  Mood is appropriate.  HEENT: Normal.  Carotids normal without bruits.  There is no JVP  elevation, lymphadenopathy, no thyromegaly.  LUNGS: Clear without wheezing and normal diaphragmatic motion.  CARDIAC: S1/S2, normal heart sounds.  PMI is normal.  ABDOMEN: Benign.  Bowel sounds are positive.  There is no tenderness.  There is no hepatosplenomegaly, hepatojugular reflux.  There is no  organomegaly.  Femorals are +2 to 3 bilaterally without bruit.  PT's are  +2 bilaterally.  There is no lower extremity edema.  No lymphadenopathy  and no muscular weakness.   His EKG shows sinus rhythm with nonspecific ST-T wave changes.  His  chest x-ray shows no active disease.   His CPK  and troponin are negative x2.  Potassium is 4.2, BUN is 23,  creatinine is 1.7, hematocrit is 47.   IMPRESSION:  Recurring substernal chest pain, somewhat atypical.  However, the patient has multiple coronary risk factors including  hypertension, hyperlipidemia and appears to have some degree of  hyperglycemia.  He is extremely fearful of having heart problems since  his wife died suddenly of them.  I discussed the options with him  including diagnostic heart catheter versus stress testing.  Both myself  and the patient agreed that diagnostic heart cath would be the quickest  and most accurate way to definitely rule out coronary disease.  The  risks including stroke, need for emergency surgery, dye reaction and  bleeding were discussed.  He is willing to proceed.  We will hydrate him  from now until the morning to try to get his creatinine in an ideal  location.  He will have a 2D echocardiogram in the morning to assess LV  function.  This way he may be able to avoid doing an LV gram.   I suspect with hydration his creatinine will settle out at 1.5 and as  far as we know he has not been an active diabetic.   The primary service will try to get his medical records from Heath Gold  to get him back on his appropriate blood pressure and cholesterol  medicine.   The patient is currently pain-free and his enzymes are negative.  I do  not think we need to start aggressive anticoagulants or beta-blockers at  this time.   Further recommendations will be based on the results of his heart cath.      [name], MD, [facility]  Electronically Signed     PCN/MEDQ  D:  03/20/2007  T:  03/20/2007  Job:  [id number]

## 2011-07-27 LAB — CROSSMATCH
ABO/RH(D): A POS
Antibody Screen: NEGATIVE

## 2011-07-27 LAB — CBC
HCT: 26.3 — ABNORMAL LOW
HCT: 31.4 — ABNORMAL LOW
HCT: 31.8 — ABNORMAL LOW
HCT: 32.2 — ABNORMAL LOW
Hemoglobin: 10.9 — ABNORMAL LOW
Hemoglobin: 10.9 — ABNORMAL LOW
Hemoglobin: 11.1 — ABNORMAL LOW
Hemoglobin: 9 — ABNORMAL LOW
MCHC: 34
MCHC: 34.2
MCHC: 34.4
MCHC: 34.6
MCV: 96.2
MCV: 96.3
MCV: 96.3
MCV: 98
Platelets: 338
Platelets: 363
Platelets: 378
Platelets: 422 — ABNORMAL HIGH
RBC: 2.68 — ABNORMAL LOW
RBC: 3.26 — ABNORMAL LOW
RBC: 3.31 — ABNORMAL LOW
RBC: 3.34 — ABNORMAL LOW
RDW: 14.3 — ABNORMAL HIGH
RDW: 14.5 — ABNORMAL HIGH
RDW: 14.6 — ABNORMAL HIGH
RDW: 14.9 — ABNORMAL HIGH
WBC: 8.3
WBC: 8.5
WBC: 8.8
WBC: 9.8

## 2011-07-27 LAB — ABO/RH: ABO/RH(D): A POS

## 2011-07-27 LAB — CREATININE, SERUM
Creatinine, Ser: 1.76 — ABNORMAL HIGH
GFR calc Af Amer: 47 — ABNORMAL LOW
GFR calc non Af Amer: 39 — ABNORMAL LOW

## 2011-07-27 LAB — BASIC METABOLIC PANEL
BUN: 13
CO2: 23
Calcium: 8.4
Chloride: 110
Creatinine, Ser: 1.41
GFR calc Af Amer: >60
GFR calc non Af Amer: 50 — ABNORMAL LOW
Glucose, Bld: 87
Potassium: 3.9
Sodium: 139

## 2011-07-29 LAB — DIFFERENTIAL
Basophils Absolute: 0
Basophils Absolute: 0.1
Basophils Relative: 1
Basophils Relative: 1
Eosinophils Absolute: 0.2
Eosinophils Absolute: 0.3
Eosinophils Relative: 4
Eosinophils Relative: 4
Lymphocytes Relative: 24
Lymphocytes Relative: 25
Lymphs Abs: 1.5
Lymphs Abs: 1.6
Monocytes Absolute: 0.6
Monocytes Absolute: 0.6
Monocytes Relative: 9
Monocytes Relative: 9
Neutro Abs: 3.8
Neutro Abs: 4.3
Neutrophils Relative %: 61
Neutrophils Relative %: 63

## 2011-07-29 LAB — POCT CARDIAC MARKERS
CKMB, poc: 1
Myoglobin, poc: 45
Operator id: 189501
Troponin i, poc: <0.05

## 2011-07-29 LAB — I-STAT 8, (EC8 V) (CONVERTED LAB)
Acid-Base Excess: 1
BUN: 19
Bicarbonate: 26.3 — ABNORMAL HIGH
Chloride: 105
Glucose, Bld: 162 — ABNORMAL HIGH
HCT: 42
Hemoglobin: 14.3
Operator id: 189501
Potassium: 4
Sodium: 137
TCO2: 28
pCO2, Ven: 45.1
pH, Ven: 7.374 — ABNORMAL HIGH

## 2011-07-29 LAB — CBC
HCT: 41.2
HCT: 41.6
Hemoglobin: 13.9
Hemoglobin: 14
MCHC: 33.8
MCHC: 33.8
MCV: 96.5
MCV: 96.7
Platelets: 342
Platelets: 350
RBC: 4.27
RBC: 4.3
RDW: 13.2
RDW: 13.3
WBC: 6.2
WBC: 6.9

## 2011-07-29 LAB — LIPASE, BLOOD: Lipase: 60 — ABNORMAL HIGH

## 2011-07-29 LAB — COMPREHENSIVE METABOLIC PANEL
ALT: 14
AST: 18
Albumin: 3.1 — ABNORMAL LOW
Alkaline Phosphatase: 61
BUN: 17
CO2: 26
Calcium: 8.8
Chloride: 107
Creatinine, Ser: 1.48
GFR calc Af Amer: 58 — ABNORMAL LOW
GFR calc non Af Amer: 48 — ABNORMAL LOW
Glucose, Bld: 152 — ABNORMAL HIGH
Potassium: 3.8
Sodium: 137
Total Bilirubin: 0.4
Total Protein: 5.8 — ABNORMAL LOW

## 2011-07-29 LAB — HEMOGLOBIN A1C: Hgb A1c MFr Bld: 8.3 — ABNORMAL HIGH

## 2011-07-29 LAB — POCT I-STAT CREATININE
Creatinine, Ser: 1.6 — ABNORMAL HIGH
Operator id: 189501

## 2011-07-29 LAB — LIPID PANEL
Cholesterol: 190
HDL: 35 — ABNORMAL LOW
LDL Cholesterol: 128 — ABNORMAL HIGH
Total CHOL/HDL Ratio: 5.4
Triglycerides: 133
VLDL: 27

## 2011-07-29 LAB — AMYLASE: Amylase: 209 — ABNORMAL HIGH

## 2011-07-29 LAB — CK TOTAL AND CKMB (NOT AT ARMC)
CK, MB: 2
CK, MB: 2.1
CK, MB: 2.6
Relative Index: INVALID
Relative Index: INVALID
Relative Index: INVALID
Total CK: 92
Total CK: 96
Total CK: 98

## 2011-07-29 LAB — APTT: aPTT: 31

## 2011-07-29 LAB — TROPONIN I
Troponin I: 0.01
Troponin I: 0.01

## 2011-07-29 LAB — PROTIME-INR
INR: 1
Prothrombin Time: 13.3

## 2011-07-30 LAB — CARDIAC PANEL(CRET KIN+CKTOT+MB+TROPI)
CK, MB: 2.1
CK, MB: 2.2
CK, MB: 2.1
Relative Index: INVALID
Relative Index: INVALID
Relative Index: INVALID
Total CK: 95
Total CK: 96
Total CK: 82
Troponin I: 0.03
Troponin I: 0.04
Troponin I: 0.01

## 2011-07-30 LAB — DIFFERENTIAL
Basophils Absolute: 0
Basophils Relative: 0
Eosinophils Absolute: 0.2
Eosinophils Relative: 3
Lymphocytes Relative: 17
Lymphs Abs: 1.4
Monocytes Absolute: 0.5
Monocytes Relative: 5
Neutro Abs: 6.3
Neutrophils Relative %: 75

## 2011-07-30 LAB — POCT CARDIAC MARKERS
CKMB, poc: 1.1
CKMB, poc: 1.8
Myoglobin, poc: 81.5
Myoglobin, poc: 97.9
Operator id: 1192
Operator id: 4531
Troponin i, poc: <0.05
Troponin i, poc: <0.05

## 2011-07-30 LAB — CBC
HCT: 42.7
HCT: 47.2
HCT: 43.6
Hemoglobin: 14.4
Hemoglobin: 16
Hemoglobin: 14.8
MCHC: 33.8
MCHC: 34
MCHC: 34
MCV: 95.8
MCV: 96.4
MCV: 96.1
Platelets: 302
Platelets: 337
Platelets: 303
RBC: 4.43
RBC: 4.93
RBC: 4.54
RDW: 12.8
RDW: 12.9
RDW: 12.8
WBC: 8.4
WBC: 9.5
WBC: 7.5

## 2011-07-30 LAB — PROTIME-INR
INR: 0.9
Prothrombin Time: 12.7

## 2011-07-30 LAB — LIPID PANEL
Cholesterol: 151
HDL: 32 — ABNORMAL LOW
LDL Cholesterol: 98
Total CHOL/HDL Ratio: 4.7
Triglycerides: 106
VLDL: 21

## 2011-07-30 LAB — BASIC METABOLIC PANEL
BUN: 22
BUN: 23
BUN: 16
CO2: 27
CO2: 27
CO2: 25
Calcium: 8.9
Calcium: 9.7
Calcium: 8.9
Chloride: 101
Chloride: 104
Chloride: 106
Creatinine, Ser: 1.69 — ABNORMAL HIGH
Creatinine, Ser: 1.7 — ABNORMAL HIGH
Creatinine, Ser: 1.62 — ABNORMAL HIGH
GFR calc Af Amer: 49 — ABNORMAL LOW
GFR calc Af Amer: 50 — ABNORMAL LOW
GFR calc Af Amer: 52 — ABNORMAL LOW
GFR calc non Af Amer: 41 — ABNORMAL LOW
GFR calc non Af Amer: 41 — ABNORMAL LOW
GFR calc non Af Amer: 43 — ABNORMAL LOW
Glucose, Bld: 142 — ABNORMAL HIGH
Glucose, Bld: 215 — ABNORMAL HIGH
Glucose, Bld: 94
Potassium: 4
Potassium: 4.2
Potassium: 4.2
Sodium: 136
Sodium: 139
Sodium: 137

## 2011-07-30 LAB — APTT: aPTT: 24

## 2011-07-30 LAB — HEMOGLOBIN A1C
Hgb A1c MFr Bld: 8.5 — ABNORMAL HIGH
Hgb A1c MFr Bld: 8.9 — ABNORMAL HIGH

## 2011-07-30 LAB — CK TOTAL AND CKMB (NOT AT ARMC)
CK, MB: 2.4
Relative Index: 2.1
Total CK: 116

## 2011-07-30 LAB — TROPONIN I: Troponin I: 0.04

## 2011-07-30 LAB — TSH: TSH: 1.23

## 2012-10-14 DIAGNOSIS — I2510 Atherosclerotic heart disease of native coronary artery without angina pectoris: Secondary | ICD-10-CM | POA: Diagnosis not present

## 2012-10-14 DIAGNOSIS — R9431 Abnormal electrocardiogram [ECG] [EKG]: Secondary | ICD-10-CM | POA: Diagnosis not present

## 2012-10-26 DIAGNOSIS — I10 Essential (primary) hypertension: Secondary | ICD-10-CM | POA: Diagnosis not present

## 2012-10-26 DIAGNOSIS — E119 Type 2 diabetes mellitus without complications: Secondary | ICD-10-CM | POA: Diagnosis not present

## 2012-10-26 DIAGNOSIS — I441 Atrioventricular block, second degree: Secondary | ICD-10-CM | POA: Diagnosis not present

## 2012-10-26 DIAGNOSIS — I2510 Atherosclerotic heart disease of native coronary artery without angina pectoris: Secondary | ICD-10-CM | POA: Diagnosis not present

## 2013-10-11 ENCOUNTER — Encounter (HOSPITAL_BASED_OUTPATIENT_CLINIC_OR_DEPARTMENT_OTHER): Payer: Medicare PPO | Attending: General Surgery

## 2013-10-11 DIAGNOSIS — W19XXXA Unspecified fall, initial encounter: Secondary | ICD-10-CM | POA: Insufficient documentation

## 2013-10-11 DIAGNOSIS — E119 Type 2 diabetes mellitus without complications: Secondary | ICD-10-CM | POA: Insufficient documentation

## 2013-10-11 DIAGNOSIS — S8010XA Contusion of unspecified lower leg, initial encounter: Secondary | ICD-10-CM | POA: Insufficient documentation

## 2013-10-11 DIAGNOSIS — Z79899 Other long term (current) drug therapy: Secondary | ICD-10-CM | POA: Insufficient documentation

## 2013-10-11 LAB — GLUCOSE, CAPILLARY: Glucose-Capillary: 166 mg/dL — ABNORMAL HIGH (ref 70–99)

## 2013-10-12 NOTE — H&P (Signed)
NAME:  William [name], William [name] NO.:  [id number]  MEDICAL RECORD NO.:  [id number]  LOCATION:  FOOT                         FACILITY:  [facility]  PHYSICIAN:  Judene Companion, M.D.     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 When he came in here today, he has a big hematoma with necrotic skin and I evacuated the hematoma, and debrided the necrotic skin, and he has a defect about 4 cm long and 1 cm wide that goes right down to the periosteum of the tibia.  We scrubbed this out and I packed it with silver alginate and we are going to every day change his dressing and wash it out with soap and water, and put a silver alginate.  He will come back here in a week.  When he was here today, his blood pressure was 134/76, respirations 17, pulse 71, temperature 97.5.  His blood sugar was 166.  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  He is a diabetic and his treatment of that is Actos and Crestor.  He also takes amlodipine and he is on Keflex which we will continue him on, and he is also on carvedilol 12.5 mg a day, and he is on vitamins.  So, we are going to continue treating him with silver collagen after he has a week of silver alginate and will keep him on Keflex.  So, his diagnosis is traumatic wound to the right leg complicated by diabetes.     Judene Companion, M.D.     PP/MEDQ  D:  10/11/2013  T:  10/12/2013  Job:  [id number]

## 2013-10-18 ENCOUNTER — Encounter (HOSPITAL_BASED_OUTPATIENT_CLINIC_OR_DEPARTMENT_OTHER): Payer: BC Managed Care – PPO | Attending: General Surgery

## 2013-10-18 DIAGNOSIS — E1169 Type 2 diabetes mellitus with other specified complication: Secondary | ICD-10-CM | POA: Insufficient documentation

## 2013-10-18 DIAGNOSIS — L97809 Non-pressure chronic ulcer of other part of unspecified lower leg with unspecified severity: Secondary | ICD-10-CM | POA: Insufficient documentation

## 2013-11-15 ENCOUNTER — Encounter (HOSPITAL_BASED_OUTPATIENT_CLINIC_OR_DEPARTMENT_OTHER): Payer: BC Managed Care – PPO | Attending: General Surgery

## 2013-11-15 DIAGNOSIS — E1169 Type 2 diabetes mellitus with other specified complication: Secondary | ICD-10-CM | POA: Insufficient documentation

## 2013-11-15 DIAGNOSIS — L97809 Non-pressure chronic ulcer of other part of unspecified lower leg with unspecified severity: Secondary | ICD-10-CM | POA: Insufficient documentation

## 2013-12-13 ENCOUNTER — Encounter (HOSPITAL_BASED_OUTPATIENT_CLINIC_OR_DEPARTMENT_OTHER): Payer: BC Managed Care – PPO | Attending: General Surgery

## 2013-12-13 DIAGNOSIS — E1169 Type 2 diabetes mellitus with other specified complication: Secondary | ICD-10-CM | POA: Insufficient documentation

## 2013-12-13 DIAGNOSIS — L97809 Non-pressure chronic ulcer of other part of unspecified lower leg with unspecified severity: Secondary | ICD-10-CM | POA: Insufficient documentation

## 2014-03-19 DIAGNOSIS — I441 Atrioventricular block, second degree: Secondary | ICD-10-CM | POA: Insufficient documentation

## 2014-03-28 DIAGNOSIS — E785 Hyperlipidemia, unspecified: Secondary | ICD-10-CM | POA: Diagnosis not present

## 2014-03-28 DIAGNOSIS — E119 Type 2 diabetes mellitus without complications: Secondary | ICD-10-CM | POA: Diagnosis not present

## 2014-03-28 DIAGNOSIS — K219 Gastro-esophageal reflux disease without esophagitis: Secondary | ICD-10-CM | POA: Diagnosis not present

## 2014-03-28 DIAGNOSIS — Z0000 Encounter for general adult medical examination without abnormal findings: Secondary | ICD-10-CM | POA: Diagnosis not present

## 2014-04-26 DIAGNOSIS — I10 Essential (primary) hypertension: Secondary | ICD-10-CM | POA: Diagnosis not present

## 2014-04-26 DIAGNOSIS — E785 Hyperlipidemia, unspecified: Secondary | ICD-10-CM | POA: Diagnosis not present

## 2014-04-26 DIAGNOSIS — I2510 Atherosclerotic heart disease of native coronary artery without angina pectoris: Secondary | ICD-10-CM | POA: Diagnosis not present

## 2014-04-26 DIAGNOSIS — E119 Type 2 diabetes mellitus without complications: Secondary | ICD-10-CM | POA: Diagnosis not present

## 2014-05-09 DIAGNOSIS — I10 Essential (primary) hypertension: Secondary | ICD-10-CM | POA: Diagnosis not present

## 2014-05-09 DIAGNOSIS — E119 Type 2 diabetes mellitus without complications: Secondary | ICD-10-CM | POA: Diagnosis not present

## 2014-08-14 ENCOUNTER — Other Ambulatory Visit: Payer: Self-pay | Admitting: Nephrology

## 2014-08-14 DIAGNOSIS — N183 Chronic kidney disease, stage 3 unspecified: Secondary | ICD-10-CM

## 2014-08-23 ENCOUNTER — Ambulatory Visit
Admission: RE | Admit: 2014-08-23 | Discharge: 2014-08-23 | Disposition: A | Discharge status: Home / Self Care | Payer: Medicare Other | Source: Ambulatory Visit | Attending: Nephrology | Admitting: Nephrology

## 2014-08-23 DIAGNOSIS — N183 Chronic kidney disease, stage 3 unspecified: Secondary | ICD-10-CM

## 2014-10-12 DIAGNOSIS — S065X9A Traumatic subdural hemorrhage with loss of consciousness of unspecified duration, initial encounter: Secondary | ICD-10-CM

## 2014-10-12 DIAGNOSIS — S065XAA Traumatic subdural hemorrhage with loss of consciousness status unknown, initial encounter: Secondary | ICD-10-CM

## 2014-10-12 HISTORY — DX: Traumatic subdural hemorrhage with loss of consciousness of unspecified duration, initial encounter: S06.5X9A

## 2014-10-12 HISTORY — PX: CRANIOTOMY: SHX93

## 2014-10-12 HISTORY — DX: Traumatic subdural hemorrhage with loss of consciousness status unknown, initial encounter: S06.5XAA

## 2014-10-26 DIAGNOSIS — I25119 Atherosclerotic heart disease of native coronary artery with unspecified angina pectoris: Secondary | ICD-10-CM | POA: Diagnosis not present

## 2014-10-26 DIAGNOSIS — N183 Chronic kidney disease, stage 3 (moderate): Secondary | ICD-10-CM | POA: Diagnosis not present

## 2014-10-26 DIAGNOSIS — I2510 Atherosclerotic heart disease of native coronary artery without angina pectoris: Secondary | ICD-10-CM | POA: Insufficient documentation

## 2014-10-26 DIAGNOSIS — I10 Essential (primary) hypertension: Secondary | ICD-10-CM | POA: Diagnosis not present

## 2014-10-26 DIAGNOSIS — N189 Chronic kidney disease, unspecified: Secondary | ICD-10-CM | POA: Diagnosis not present

## 2014-10-26 DIAGNOSIS — E119 Type 2 diabetes mellitus without complications: Secondary | ICD-10-CM | POA: Diagnosis not present

## 2014-10-26 DIAGNOSIS — E785 Hyperlipidemia, unspecified: Secondary | ICD-10-CM | POA: Diagnosis not present

## 2014-10-26 DIAGNOSIS — I4430 Unspecified atrioventricular block: Secondary | ICD-10-CM | POA: Diagnosis not present

## 2014-11-07 DIAGNOSIS — E780 Pure hypercholesterolemia: Secondary | ICD-10-CM | POA: Diagnosis not present

## 2014-11-07 DIAGNOSIS — E119 Type 2 diabetes mellitus without complications: Secondary | ICD-10-CM | POA: Diagnosis not present

## 2015-01-25 DIAGNOSIS — E784 Other hyperlipidemia: Secondary | ICD-10-CM | POA: Diagnosis not present

## 2015-01-25 DIAGNOSIS — I10 Essential (primary) hypertension: Secondary | ICD-10-CM | POA: Diagnosis not present

## 2015-01-25 DIAGNOSIS — R9431 Abnormal electrocardiogram [ECG] [EKG]: Secondary | ICD-10-CM | POA: Diagnosis not present

## 2015-01-25 DIAGNOSIS — I4430 Unspecified atrioventricular block: Secondary | ICD-10-CM | POA: Diagnosis not present

## 2015-01-25 DIAGNOSIS — N183 Chronic kidney disease, stage 3 (moderate): Secondary | ICD-10-CM | POA: Diagnosis not present

## 2015-01-25 DIAGNOSIS — E119 Type 2 diabetes mellitus without complications: Secondary | ICD-10-CM | POA: Diagnosis not present

## 2015-01-25 DIAGNOSIS — I25119 Atherosclerotic heart disease of native coronary artery with unspecified angina pectoris: Secondary | ICD-10-CM | POA: Diagnosis not present

## 2015-01-26 DIAGNOSIS — I081 Rheumatic disorders of both mitral and tricuspid valves: Secondary | ICD-10-CM | POA: Diagnosis not present

## 2015-01-26 DIAGNOSIS — I129 Hypertensive chronic kidney disease with stage 1 through stage 4 chronic kidney disease, or unspecified chronic kidney disease: Secondary | ICD-10-CM | POA: Diagnosis not present

## 2015-01-26 DIAGNOSIS — I441 Atrioventricular block, second degree: Secondary | ICD-10-CM | POA: Diagnosis not present

## 2015-01-26 DIAGNOSIS — N183 Chronic kidney disease, stage 3 (moderate): Secondary | ICD-10-CM | POA: Diagnosis present

## 2015-01-26 DIAGNOSIS — E1122 Type 2 diabetes mellitus with diabetic chronic kidney disease: Secondary | ICD-10-CM | POA: Diagnosis present

## 2015-01-26 DIAGNOSIS — I2510 Atherosclerotic heart disease of native coronary artery without angina pectoris: Secondary | ICD-10-CM | POA: Diagnosis not present

## 2015-01-26 DIAGNOSIS — Z955 Presence of coronary angioplasty implant and graft: Secondary | ICD-10-CM | POA: Diagnosis not present

## 2015-01-26 DIAGNOSIS — I442 Atrioventricular block, complete: Secondary | ICD-10-CM | POA: Diagnosis not present

## 2015-01-26 DIAGNOSIS — I10 Essential (primary) hypertension: Secondary | ICD-10-CM | POA: Diagnosis not present

## 2015-01-26 DIAGNOSIS — E119 Type 2 diabetes mellitus without complications: Secondary | ICD-10-CM | POA: Diagnosis not present

## 2015-01-26 DIAGNOSIS — R001 Bradycardia, unspecified: Secondary | ICD-10-CM | POA: Diagnosis not present

## 2015-01-26 DIAGNOSIS — R9431 Abnormal electrocardiogram [ECG] [EKG]: Secondary | ICD-10-CM | POA: Diagnosis not present

## 2015-01-26 DIAGNOSIS — N189 Chronic kidney disease, unspecified: Secondary | ICD-10-CM | POA: Diagnosis not present

## 2015-01-26 DIAGNOSIS — E785 Hyperlipidemia, unspecified: Secondary | ICD-10-CM | POA: Diagnosis not present

## 2015-01-26 DIAGNOSIS — Z950 Presence of cardiac pacemaker: Secondary | ICD-10-CM | POA: Diagnosis not present

## 2015-01-26 DIAGNOSIS — I4430 Unspecified atrioventricular block: Secondary | ICD-10-CM | POA: Diagnosis not present

## 2015-02-01 DIAGNOSIS — E118 Type 2 diabetes mellitus with unspecified complications: Secondary | ICD-10-CM | POA: Diagnosis not present

## 2015-04-24 DIAGNOSIS — E1122 Type 2 diabetes mellitus with diabetic chronic kidney disease: Secondary | ICD-10-CM | POA: Diagnosis not present

## 2015-04-24 DIAGNOSIS — N189 Chronic kidney disease, unspecified: Secondary | ICD-10-CM | POA: Diagnosis not present

## 2015-04-24 DIAGNOSIS — Z950 Presence of cardiac pacemaker: Secondary | ICD-10-CM | POA: Diagnosis not present

## 2015-04-24 DIAGNOSIS — S40811A Abrasion of right upper arm, initial encounter: Secondary | ICD-10-CM | POA: Diagnosis not present

## 2015-04-24 DIAGNOSIS — I1310 Hypertensive heart and chronic kidney disease without heart failure, with stage 1 through stage 4 chronic kidney disease, or unspecified chronic kidney disease: Secondary | ICD-10-CM | POA: Diagnosis not present

## 2015-06-03 DIAGNOSIS — N183 Chronic kidney disease, stage 3 (moderate): Secondary | ICD-10-CM | POA: Diagnosis not present

## 2015-06-03 DIAGNOSIS — N189 Chronic kidney disease, unspecified: Secondary | ICD-10-CM | POA: Diagnosis not present

## 2015-06-03 DIAGNOSIS — N2581 Secondary hyperparathyroidism of renal origin: Secondary | ICD-10-CM | POA: Diagnosis not present

## 2015-06-05 DIAGNOSIS — K219 Gastro-esophageal reflux disease without esophagitis: Secondary | ICD-10-CM | POA: Diagnosis not present

## 2015-07-08 DIAGNOSIS — S065X9A Traumatic subdural hemorrhage with loss of consciousness of unspecified duration, initial encounter: Secondary | ICD-10-CM | POA: Insufficient documentation

## 2015-07-08 DIAGNOSIS — S065XAA Traumatic subdural hemorrhage with loss of consciousness status unknown, initial encounter: Secondary | ICD-10-CM | POA: Insufficient documentation

## 2015-07-09 DIAGNOSIS — Z955 Presence of coronary angioplasty implant and graft: Secondary | ICD-10-CM | POA: Diagnosis not present

## 2015-07-09 DIAGNOSIS — I70203 Unspecified atherosclerosis of native arteries of extremities, bilateral legs: Secondary | ICD-10-CM | POA: Diagnosis present

## 2015-07-09 DIAGNOSIS — S065X0A Traumatic subdural hemorrhage without loss of consciousness, initial encounter: Secondary | ICD-10-CM | POA: Diagnosis present

## 2015-07-09 DIAGNOSIS — N183 Chronic kidney disease, stage 3 (moderate): Secondary | ICD-10-CM | POA: Diagnosis not present

## 2015-07-09 DIAGNOSIS — Z87891 Personal history of nicotine dependence: Secondary | ICD-10-CM | POA: Diagnosis not present

## 2015-07-09 DIAGNOSIS — I129 Hypertensive chronic kidney disease with stage 1 through stage 4 chronic kidney disease, or unspecified chronic kidney disease: Secondary | ICD-10-CM | POA: Diagnosis present

## 2015-07-09 DIAGNOSIS — E559 Vitamin D deficiency, unspecified: Secondary | ICD-10-CM | POA: Diagnosis present

## 2015-07-09 DIAGNOSIS — E785 Hyperlipidemia, unspecified: Secondary | ICD-10-CM | POA: Diagnosis present

## 2015-07-09 DIAGNOSIS — Z8249 Family history of ischemic heart disease and other diseases of the circulatory system: Secondary | ICD-10-CM | POA: Diagnosis not present

## 2015-07-09 DIAGNOSIS — I2510 Atherosclerotic heart disease of native coronary artery without angina pectoris: Secondary | ICD-10-CM | POA: Diagnosis present

## 2015-07-09 DIAGNOSIS — Z950 Presence of cardiac pacemaker: Secondary | ICD-10-CM | POA: Diagnosis not present

## 2015-07-09 DIAGNOSIS — E1122 Type 2 diabetes mellitus with diabetic chronic kidney disease: Secondary | ICD-10-CM | POA: Diagnosis present

## 2015-07-09 DIAGNOSIS — N401 Enlarged prostate with lower urinary tract symptoms: Secondary | ICD-10-CM | POA: Diagnosis present

## 2015-07-09 DIAGNOSIS — R350 Frequency of micturition: Secondary | ICD-10-CM | POA: Diagnosis present

## 2015-07-09 DIAGNOSIS — J322 Chronic ethmoidal sinusitis: Secondary | ICD-10-CM | POA: Diagnosis present

## 2015-07-11 DIAGNOSIS — J322 Chronic ethmoidal sinusitis: Secondary | ICD-10-CM

## 2015-07-11 HISTORY — DX: Chronic ethmoidal sinusitis: J32.2

## 2015-07-15 DIAGNOSIS — R4189 Other symptoms and signs involving cognitive functions and awareness: Secondary | ICD-10-CM | POA: Diagnosis present

## 2015-07-15 DIAGNOSIS — R531 Weakness: Secondary | ICD-10-CM | POA: Diagnosis present

## 2015-07-15 DIAGNOSIS — R000 Tachycardia, unspecified: Secondary | ICD-10-CM | POA: Diagnosis not present

## 2015-07-15 DIAGNOSIS — E1122 Type 2 diabetes mellitus with diabetic chronic kidney disease: Secondary | ICD-10-CM | POA: Diagnosis present

## 2015-07-15 DIAGNOSIS — E785 Hyperlipidemia, unspecified: Secondary | ICD-10-CM | POA: Diagnosis present

## 2015-07-15 DIAGNOSIS — I2510 Atherosclerotic heart disease of native coronary artery without angina pectoris: Secondary | ICD-10-CM | POA: Diagnosis present

## 2015-07-15 DIAGNOSIS — Z955 Presence of coronary angioplasty implant and graft: Secondary | ICD-10-CM | POA: Diagnosis not present

## 2015-07-15 DIAGNOSIS — I129 Hypertensive chronic kidney disease with stage 1 through stage 4 chronic kidney disease, or unspecified chronic kidney disease: Secondary | ICD-10-CM | POA: Diagnosis present

## 2015-07-15 DIAGNOSIS — J329 Chronic sinusitis, unspecified: Secondary | ICD-10-CM | POA: Diagnosis present

## 2015-07-15 DIAGNOSIS — K219 Gastro-esophageal reflux disease without esophagitis: Secondary | ICD-10-CM | POA: Diagnosis present

## 2015-07-15 DIAGNOSIS — N189 Chronic kidney disease, unspecified: Secondary | ICD-10-CM | POA: Diagnosis present

## 2015-07-15 DIAGNOSIS — I442 Atrioventricular block, complete: Secondary | ICD-10-CM | POA: Diagnosis not present

## 2015-07-15 DIAGNOSIS — R269 Unspecified abnormalities of gait and mobility: Secondary | ICD-10-CM | POA: Diagnosis not present

## 2015-07-15 DIAGNOSIS — Z950 Presence of cardiac pacemaker: Secondary | ICD-10-CM | POA: Diagnosis not present

## 2015-07-15 DIAGNOSIS — R2689 Other abnormalities of gait and mobility: Secondary | ICD-10-CM | POA: Diagnosis present

## 2015-07-25 DIAGNOSIS — R000 Tachycardia, unspecified: Secondary | ICD-10-CM | POA: Diagnosis not present

## 2015-07-25 DIAGNOSIS — Z950 Presence of cardiac pacemaker: Secondary | ICD-10-CM | POA: Diagnosis not present

## 2015-07-25 DIAGNOSIS — I442 Atrioventricular block, complete: Secondary | ICD-10-CM | POA: Diagnosis not present

## 2015-07-30 DIAGNOSIS — R55 Syncope and collapse: Secondary | ICD-10-CM | POA: Diagnosis not present

## 2015-07-30 DIAGNOSIS — S0990XA Unspecified injury of head, initial encounter: Secondary | ICD-10-CM | POA: Diagnosis not present

## 2015-07-30 DIAGNOSIS — Z743 Need for continuous supervision: Secondary | ICD-10-CM | POA: Diagnosis not present

## 2015-07-30 DIAGNOSIS — N179 Acute kidney failure, unspecified: Secondary | ICD-10-CM | POA: Diagnosis not present

## 2015-07-30 DIAGNOSIS — E860 Dehydration: Secondary | ICD-10-CM | POA: Diagnosis not present

## 2015-07-30 DIAGNOSIS — I6200 Nontraumatic subdural hemorrhage, unspecified: Secondary | ICD-10-CM | POA: Diagnosis not present

## 2015-08-07 DIAGNOSIS — N183 Chronic kidney disease, stage 3 (moderate): Secondary | ICD-10-CM | POA: Diagnosis not present

## 2015-08-07 DIAGNOSIS — I25119 Atherosclerotic heart disease of native coronary artery with unspecified angina pectoris: Secondary | ICD-10-CM | POA: Diagnosis not present

## 2015-08-07 DIAGNOSIS — I6200 Nontraumatic subdural hemorrhage, unspecified: Secondary | ICD-10-CM | POA: Diagnosis not present

## 2015-08-07 DIAGNOSIS — E119 Type 2 diabetes mellitus without complications: Secondary | ICD-10-CM | POA: Diagnosis not present

## 2015-08-07 DIAGNOSIS — I129 Hypertensive chronic kidney disease with stage 1 through stage 4 chronic kidney disease, or unspecified chronic kidney disease: Secondary | ICD-10-CM | POA: Diagnosis not present

## 2015-08-13 DIAGNOSIS — I6200 Nontraumatic subdural hemorrhage, unspecified: Secondary | ICD-10-CM | POA: Diagnosis not present

## 2015-08-16 DIAGNOSIS — E119 Type 2 diabetes mellitus without complications: Secondary | ICD-10-CM | POA: Diagnosis not present

## 2015-08-16 DIAGNOSIS — N183 Chronic kidney disease, stage 3 (moderate): Secondary | ICD-10-CM | POA: Diagnosis not present

## 2015-08-16 DIAGNOSIS — I6200 Nontraumatic subdural hemorrhage, unspecified: Secondary | ICD-10-CM | POA: Diagnosis not present

## 2015-08-19 DIAGNOSIS — Z950 Presence of cardiac pacemaker: Secondary | ICD-10-CM | POA: Diagnosis not present

## 2015-08-19 DIAGNOSIS — I4891 Unspecified atrial fibrillation: Secondary | ICD-10-CM | POA: Diagnosis not present

## 2015-08-19 DIAGNOSIS — I10 Essential (primary) hypertension: Secondary | ICD-10-CM | POA: Diagnosis not present

## 2015-08-19 DIAGNOSIS — I442 Atrioventricular block, complete: Secondary | ICD-10-CM | POA: Diagnosis not present

## 2015-08-19 DIAGNOSIS — E785 Hyperlipidemia, unspecified: Secondary | ICD-10-CM | POA: Diagnosis not present

## 2015-08-19 DIAGNOSIS — Z8679 Personal history of other diseases of the circulatory system: Secondary | ICD-10-CM | POA: Diagnosis not present

## 2015-09-03 DIAGNOSIS — F068 Other specified mental disorders due to known physiological condition: Secondary | ICD-10-CM | POA: Diagnosis not present

## 2015-09-03 DIAGNOSIS — S069X9A Unspecified intracranial injury with loss of consciousness of unspecified duration, initial encounter: Secondary | ICD-10-CM | POA: Diagnosis not present

## 2015-09-11 DIAGNOSIS — E785 Hyperlipidemia, unspecified: Secondary | ICD-10-CM | POA: Diagnosis not present

## 2015-09-11 DIAGNOSIS — E119 Type 2 diabetes mellitus without complications: Secondary | ICD-10-CM | POA: Diagnosis not present

## 2015-09-11 DIAGNOSIS — I129 Hypertensive chronic kidney disease with stage 1 through stage 4 chronic kidney disease, or unspecified chronic kidney disease: Secondary | ICD-10-CM | POA: Diagnosis not present

## 2015-09-11 DIAGNOSIS — N183 Chronic kidney disease, stage 3 (moderate): Secondary | ICD-10-CM | POA: Diagnosis not present

## 2015-10-25 DIAGNOSIS — I6200 Nontraumatic subdural hemorrhage, unspecified: Secondary | ICD-10-CM | POA: Diagnosis not present

## 2015-11-05 DIAGNOSIS — Z48811 Encounter for surgical aftercare following surgery on the nervous system: Secondary | ICD-10-CM | POA: Diagnosis not present

## 2015-11-08 DIAGNOSIS — I441 Atrioventricular block, second degree: Secondary | ICD-10-CM | POA: Diagnosis not present

## 2015-11-08 DIAGNOSIS — Z950 Presence of cardiac pacemaker: Secondary | ICD-10-CM | POA: Diagnosis not present

## 2015-11-08 DIAGNOSIS — I10 Essential (primary) hypertension: Secondary | ICD-10-CM | POA: Diagnosis not present

## 2015-11-08 DIAGNOSIS — I2510 Atherosclerotic heart disease of native coronary artery without angina pectoris: Secondary | ICD-10-CM | POA: Diagnosis not present

## 2015-12-19 DIAGNOSIS — N2581 Secondary hyperparathyroidism of renal origin: Secondary | ICD-10-CM | POA: Diagnosis not present

## 2015-12-19 DIAGNOSIS — N183 Chronic kidney disease, stage 3 (moderate): Secondary | ICD-10-CM | POA: Diagnosis not present

## 2015-12-19 DIAGNOSIS — D631 Anemia in chronic kidney disease: Secondary | ICD-10-CM | POA: Diagnosis not present

## 2015-12-19 DIAGNOSIS — I129 Hypertensive chronic kidney disease with stage 1 through stage 4 chronic kidney disease, or unspecified chronic kidney disease: Secondary | ICD-10-CM | POA: Diagnosis not present

## 2016-01-14 DIAGNOSIS — I10 Essential (primary) hypertension: Secondary | ICD-10-CM | POA: Diagnosis not present

## 2016-01-14 DIAGNOSIS — I25119 Atherosclerotic heart disease of native coronary artery with unspecified angina pectoris: Secondary | ICD-10-CM | POA: Diagnosis not present

## 2016-01-14 DIAGNOSIS — E119 Type 2 diabetes mellitus without complications: Secondary | ICD-10-CM | POA: Diagnosis not present

## 2016-01-14 DIAGNOSIS — N183 Chronic kidney disease, stage 3 (moderate): Secondary | ICD-10-CM | POA: Diagnosis not present

## 2016-01-15 DIAGNOSIS — E785 Hyperlipidemia, unspecified: Secondary | ICD-10-CM | POA: Diagnosis not present

## 2016-01-15 DIAGNOSIS — E119 Type 2 diabetes mellitus without complications: Secondary | ICD-10-CM | POA: Diagnosis not present

## 2016-01-16 DIAGNOSIS — I10 Essential (primary) hypertension: Secondary | ICD-10-CM | POA: Diagnosis not present

## 2016-01-29 DIAGNOSIS — Z950 Presence of cardiac pacemaker: Secondary | ICD-10-CM | POA: Diagnosis not present

## 2017-10-18 ENCOUNTER — Emergency Department (HOSPITAL_COMMUNITY): Payer: Medicare Other

## 2017-10-18 ENCOUNTER — Encounter (HOSPITAL_COMMUNITY): Payer: Self-pay | Admitting: Emergency Medicine

## 2017-10-18 ENCOUNTER — Emergency Department (HOSPITAL_COMMUNITY)
Admission: EM | Admit: 2017-10-18 | Discharge: 2017-10-18 | Disposition: A | Discharge status: Home / Self Care | Payer: Medicare Other | Attending: Emergency Medicine | Admitting: Emergency Medicine

## 2017-10-18 DIAGNOSIS — R05 Cough: Secondary | ICD-10-CM | POA: Insufficient documentation

## 2017-10-18 DIAGNOSIS — Z5321 Procedure and treatment not carried out due to patient leaving prior to being seen by health care provider: Secondary | ICD-10-CM | POA: Insufficient documentation

## 2017-10-18 HISTORY — DX: Benign intracranial hypertension: G93.2

## 2017-10-18 HISTORY — DX: Essential (primary) hypertension: I10

## 2017-10-18 NOTE — ED Triage Notes (Signed)
Pt comes in with complaints of a constant recurring cough that started about 8 days. States he did not have a cold, allergies, or asthma to flare up the cough and it just came out of no where.  Endorses it as a strong dry cough. Afebrile on assessment. Hypertensive on arrival and patient states he took his blood pressure medication today. Pt also needs a referral to a cardiologist here in the mean time before he moves to [place].

## 2017-10-18 NOTE — ED Notes (Signed)
Dylan, NT called patient to update vital signs with no answer.

## 2017-10-18 NOTE — ED Notes (Signed)
Pt called from the lobby with no response x3 

## 2017-10-19 ENCOUNTER — Emergency Department (HOSPITAL_BASED_OUTPATIENT_CLINIC_OR_DEPARTMENT_OTHER)
Admission: EM | Admit: 2017-10-19 | Discharge: 2017-10-19 | Disposition: A | Discharge status: Home / Self Care | Payer: Medicare Other | Attending: Emergency Medicine | Admitting: Emergency Medicine

## 2017-10-19 ENCOUNTER — Other Ambulatory Visit: Payer: Self-pay

## 2017-10-19 ENCOUNTER — Encounter (HOSPITAL_BASED_OUTPATIENT_CLINIC_OR_DEPARTMENT_OTHER): Payer: Self-pay | Admitting: Emergency Medicine

## 2017-10-19 DIAGNOSIS — J189 Pneumonia, unspecified organism: Secondary | ICD-10-CM

## 2017-10-19 DIAGNOSIS — R05 Cough: Secondary | ICD-10-CM | POA: Diagnosis present

## 2017-10-19 DIAGNOSIS — Z7984 Long term (current) use of oral hypoglycemic drugs: Secondary | ICD-10-CM | POA: Diagnosis not present

## 2017-10-19 DIAGNOSIS — Z79899 Other long term (current) drug therapy: Secondary | ICD-10-CM | POA: Diagnosis not present

## 2017-10-19 DIAGNOSIS — E119 Type 2 diabetes mellitus without complications: Secondary | ICD-10-CM | POA: Insufficient documentation

## 2017-10-19 DIAGNOSIS — I10 Essential (primary) hypertension: Secondary | ICD-10-CM | POA: Insufficient documentation

## 2017-10-19 LAB — CBC WITH DIFFERENTIAL/PLATELET
Basophils Absolute: 0.1 10*3/uL (ref 0.0–0.1)
Basophils Relative: 1 %
Eosinophils Absolute: 0.6 10*3/uL (ref 0.0–0.7)
Eosinophils Relative: 7 %
HCT: 38.9 % — ABNORMAL LOW (ref 39.0–52.0)
Hemoglobin: 13.1 g/dL (ref 13.0–17.0)
Lymphocytes Relative: 20 %
Lymphs Abs: 1.7 10*3/uL (ref 0.7–4.0)
MCH: 31.9 pg (ref 26.0–34.0)
MCHC: 33.7 g/dL (ref 30.0–36.0)
MCV: 94.6 fL (ref 78.0–100.0)
Monocytes Absolute: 0.8 10*3/uL (ref 0.1–1.0)
Monocytes Relative: 10 %
Neutro Abs: 5 10*3/uL (ref 1.7–7.7)
Neutrophils Relative %: 62 %
Platelets: 331 10*3/uL (ref 150–400)
RBC: 4.11 MIL/uL — ABNORMAL LOW (ref 4.22–5.81)
RDW: 12.6 % (ref 11.5–15.5)
WBC: 8.2 10*3/uL (ref 4.0–10.5)

## 2017-10-19 LAB — COMPREHENSIVE METABOLIC PANEL
ALT: 17 U/L (ref 17–63)
AST: 18 U/L (ref 15–41)
Albumin: 3.5 g/dL (ref 3.5–5.0)
Alkaline Phosphatase: 91 U/L (ref 38–126)
Anion gap: 7 (ref 5–15)
BUN: 28 mg/dL — ABNORMAL HIGH (ref 6–20)
CO2: 25 mmol/L (ref 22–32)
Calcium: 9.1 mg/dL (ref 8.9–10.3)
Chloride: 105 mmol/L (ref 101–111)
Creatinine, Ser: 2.37 mg/dL — ABNORMAL HIGH (ref 0.61–1.24)
GFR calc Af Amer: 29 mL/min — ABNORMAL LOW (ref >60)
GFR calc non Af Amer: 25 mL/min — ABNORMAL LOW (ref >60)
Glucose, Bld: 99 mg/dL (ref 65–99)
Potassium: 4.9 mmol/L (ref 3.5–5.1)
Sodium: 137 mmol/L (ref 135–145)
Total Bilirubin: 0.6 mg/dL (ref 0.3–1.2)
Total Protein: 6.8 g/dL (ref 6.5–8.1)

## 2017-10-19 LAB — URINALYSIS, ROUTINE W REFLEX MICROSCOPIC
Bilirubin Urine: NEGATIVE
Glucose, UA: NEGATIVE mg/dL
Ketones, ur: NEGATIVE mg/dL
Leukocytes, UA: NEGATIVE
Nitrite: NEGATIVE
Protein, ur: 100 mg/dL — AB
Specific Gravity, Urine: 1.015 (ref 1.005–1.030)
pH: 6 (ref 5.0–8.0)

## 2017-10-19 LAB — URINALYSIS, MICROSCOPIC (REFLEX)

## 2017-10-19 LAB — TROPONIN I: Troponin I: <0.03 ng/mL (ref <0.03)

## 2017-10-19 MED ORDER — BENZONATATE 100 MG PO CAPS: 100.0000 mg | ORAL_CAPSULE | Freq: Three times a day (TID) | ORAL | 0 refills | Status: DC

## 2017-10-19 MED ORDER — DOXYCYCLINE HYCLATE 100 MG PO CAPS: 100.0000 mg | ORAL_CAPSULE | Freq: Two times a day (BID) | ORAL | 0 refills | Status: DC

## 2017-10-19 MED FILL — BENZONATATE 100 MG CAPSULE: 100 | 7 days supply | Qty: 21 | Fill #0

## 2017-10-19 MED FILL — DOXYCYCLINE HYC 100 MG CAP: 100 | 10 days supply | Qty: 20 | Fill #0

## 2017-10-19 NOTE — ED Triage Notes (Signed)
Pt having issues with his bp.  Went to [facility] for same yesterday but waiting a long time and never got seen.  Pt here today for dry cough and bp being high.  See triage note from yesterday.

## 2017-10-19 NOTE — Discharge Instructions (Signed)
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
## 2017-10-26 ENCOUNTER — Other Ambulatory Visit: Payer: Self-pay

## 2017-10-26 ENCOUNTER — Emergency Department (HOSPITAL_COMMUNITY)
Admission: EM | Admit: 2017-10-26 | Discharge: 2017-10-26 | Disposition: A | Discharge status: Home / Self Care | Payer: Medicare Other | Attending: Emergency Medicine | Admitting: Emergency Medicine

## 2017-10-26 ENCOUNTER — Encounter (HOSPITAL_COMMUNITY): Payer: Self-pay | Admitting: Emergency Medicine

## 2017-10-26 DIAGNOSIS — N189 Chronic kidney disease, unspecified: Secondary | ICD-10-CM | POA: Diagnosis not present

## 2017-10-26 DIAGNOSIS — I10 Essential (primary) hypertension: Secondary | ICD-10-CM

## 2017-10-26 DIAGNOSIS — E119 Type 2 diabetes mellitus without complications: Secondary | ICD-10-CM | POA: Diagnosis not present

## 2017-10-26 DIAGNOSIS — Z7984 Long term (current) use of oral hypoglycemic drugs: Secondary | ICD-10-CM | POA: Insufficient documentation

## 2017-10-26 DIAGNOSIS — I129 Hypertensive chronic kidney disease with stage 1 through stage 4 chronic kidney disease, or unspecified chronic kidney disease: Secondary | ICD-10-CM | POA: Diagnosis present

## 2017-10-26 LAB — BASIC METABOLIC PANEL
Anion gap: 12 (ref 5–15)
BUN: 26 mg/dL — ABNORMAL HIGH (ref 6–20)
CO2: 23 mmol/L (ref 22–32)
Calcium: 8.9 mg/dL (ref 8.9–10.3)
Chloride: 101 mmol/L (ref 101–111)
Creatinine, Ser: 2.76 mg/dL — ABNORMAL HIGH (ref 0.61–1.24)
GFR calc Af Amer: 24 mL/min — ABNORMAL LOW (ref >60)
GFR calc non Af Amer: 21 mL/min — ABNORMAL LOW (ref >60)
Glucose, Bld: 160 mg/dL — ABNORMAL HIGH (ref 65–99)
Potassium: 3.9 mmol/L (ref 3.5–5.1)
Sodium: 136 mmol/L (ref 135–145)

## 2017-10-26 LAB — CBC
HCT: 39.4 % (ref 39.0–52.0)
Hemoglobin: 13.6 g/dL (ref 13.0–17.0)
MCH: 32.7 pg (ref 26.0–34.0)
MCHC: 34.5 g/dL (ref 30.0–36.0)
MCV: 94.7 fL (ref 78.0–100.0)
Platelets: 352 10*3/uL (ref 150–400)
RBC: 4.16 MIL/uL — ABNORMAL LOW (ref 4.22–5.81)
RDW: 12.5 % (ref 11.5–15.5)
WBC: 8 10*3/uL (ref 4.0–10.5)

## 2017-10-26 MED ORDER — AMLODIPINE BESYLATE 5 MG PO TABS: 10.0000 mg | ORAL_TABLET | Freq: Every day | ORAL | 0 refills | Status: DC
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
## 2017-10-26 NOTE — ED Triage Notes (Signed)
Pt reports continued issues with hypertension, states for two BP has been over 174 systolic. Called PCP with no answer. Denies pain.

## 2017-10-26 NOTE — Discharge Instructions (Signed)
Continue your carvedilol at 12.5 and increase your amlodipine to 10mg  daily.  Follow up with your doctor if you do not notice an improvement in your blood pressure.

## 2017-10-29 ENCOUNTER — Ambulatory Visit: Payer: Medicare Other | Admitting: Family Medicine

## 2017-10-29 ENCOUNTER — Other Ambulatory Visit: Payer: Self-pay

## 2017-10-29 ENCOUNTER — Encounter: Payer: Self-pay | Admitting: Family Medicine

## 2017-10-29 VITALS — BP 152/70 | HR 78 | Temp 98.6°F | Ht 67.0 in | Wt 174.0 lb

## 2017-10-29 DIAGNOSIS — I10 Essential (primary) hypertension: Secondary | ICD-10-CM | POA: Diagnosis not present

## 2017-10-29 DIAGNOSIS — E119 Type 2 diabetes mellitus without complications: Secondary | ICD-10-CM | POA: Diagnosis not present

## 2017-10-29 DIAGNOSIS — E785 Hyperlipidemia, unspecified: Secondary | ICD-10-CM | POA: Diagnosis not present

## 2017-10-29 LAB — POCT GLYCOSYLATED HEMOGLOBIN (HGB A1C): Hemoglobin A1C: 6.8

## 2017-10-29 MED ORDER — ROSUVASTATIN CALCIUM 20 MG PO TABS: 20.0000 mg | ORAL_TABLET | Freq: Every day | ORAL | 2 refills | Status: DC

## 2017-10-29 MED ORDER — LOSARTAN POTASSIUM 100 MG PO TABS
100.0000 mg | ORAL_TABLET | Freq: Every day | ORAL | 3 refills | Status: DC
Start: 2017-10-29 — End: 2018-10-21

## 2017-10-29 MED ORDER — HYDROCHLOROTHIAZIDE 25 MG PO TABS: 25.0000 mg | ORAL_TABLET | Freq: Every day | ORAL | 3 refills | Status: DC

## 2017-10-29 MED ORDER — SITAGLIPTIN PHOSPHATE 25 MG PO TABS: 25.0000 mg | ORAL_TABLET | Freq: Every day | ORAL | 2 refills | Status: DC

## 2017-10-29 MED ORDER — CARVEDILOL 6.25 MG PO TABS: 6.2500 mg | ORAL_TABLET | Freq: Two times a day (BID) | ORAL | 2 refills | Status: DC

## 2017-10-29 NOTE — Patient Instructions (Addendum)
It was great to meet you today! Thank you for letting me participate in your care!  Today, we discussed your current medical issues and medication changes.  I have changed and added a few new medications.   Please take Amlodipine 10mg  once per day. Please take Januvia 25mg  once per day. Please take Losartaan 100mg  once per day Please take HCTZ 25mg  once per day. Please take Carvedilol 6.125mg  once per day.  Please continue to monitor your BP in the morning before activity and keep a diary. We will reivew it once you return for your follow up visit.  Please keep a record of your blood sugar levels and check it daily so we can review it at your follow up appointment.  Be well, Harolyn Rutherford, DO PGY-1, [facility]

## 2017-10-29 NOTE — Progress Notes (Signed)
Subjective: No chief complaint on file.    HPI: William [name] is a 77 y.o. presenting to clinic today to discuss the following:  1 Establish care 2 Review medical conditions 3 Follow up for ED visit due to refractory HTN and HTN urgency with reported BP of 200/100 at home with headache. 4 DM management  William [name] is a 77y/o male with PMH of HTN, DM, and CKD stage IV. He was recently seen in the ED and treated for HTN urgency which improved with adding amlodipine 79m to his medication regimen. He has been taking his BP at home with readings ranging from 1536Rto 1443Xsystolic and 954M-086Pdiastolic. He has had some confusion about which BP medications he should be taking at home as well but has been taking Losartaan 1020m Amlodipine 56m48mand Carvedilol 12.56mg656me states carvedilol makes his scrotum "feel like sandpaper" and would like to discontinue using it.  He checks his blood glucose level in the mornings and before meals and states generally they are at the low 100s in the morning and he has not been checking them lately in the evening. We will be getting a HgbA1c today which was at 6.8. He has not taken Janumet for about 1 month.  Patient was recently hospitalized due to PNA for which he was treated and he states he has improved greatly. Still has lingering cough but no fevers, no difficulty breathing, cough is now non-productive. He has been taking doxycycline and will finish this tomorrow.  Review of Systems  Constitutional: Negative for chills, fever and weight loss.  HENT: Positive for congestion. Negative for ear pain, hearing loss, sinus pain, sore throat and tinnitus.   Eyes: Negative for blurred vision and double vision.  Respiratory: Positive for cough. Negative for sputum production, shortness of breath and wheezing.   Cardiovascular: Negative for chest pain and palpitations.  Gastrointestinal: Positive for heartburn. Negative for abdominal pain,  constipation, diarrhea, nausea and vomiting.  Genitourinary: Negative for dysuria and urgency.  Musculoskeletal: Negative for myalgias.  Skin: Negative for itching and rash.  Neurological: Positive for headaches. Negative for dizziness, focal weakness and loss of consciousness.   Health Maintenance: Influenza vaccine, PCV 13 1 of 2 today, Tetanus booster patient states he has gotten these shots. We will get him to sign a release form and follow up on results.     ROS noted in HPI.   Past Medical, Surgical, Social, and Family History Reviewed & Updated per EMR.   Pertinent Historical Findings include:   Social History   Tobacco Use  Smoking Status Never Smoker  Smokeless Tobacco Never Used      Objective: BP (!) 152/70   Pulse 78   Temp 98.6 F (37 C) (Oral)   Ht 5' 7"  (1.702 m)   Wt 174 lb (78.9 kg)   SpO2 98%   BMI 27.25 kg/m  Vitals and nursing notes reviewed  Physical Exam  Constitutional: He is oriented to person, place, and time and well-developed, well-nourished, and in no distress. No distress.  HENT:  Head: Normocephalic and atraumatic.  Right Ear: External ear normal.  Left Ear: External ear normal.  Mouth/Throat: Oropharynx is clear and moist. No oropharyngeal exudate.  Eyes: Conjunctivae and EOM are normal. Pupils are equal, round, and reactive to light. No scleral icterus.  Neck: Normal range of motion. Neck supple. No tracheal deviation present. No thyromegaly present.  Cardiovascular: Normal rate, regular rhythm, normal heart sounds and intact  distal pulses.  No murmur heard. Pulmonary/Chest: Effort normal and breath sounds normal. He has no wheezes. He has no rales.  Abdominal: Soft. Bowel sounds are normal. There is no tenderness. There is no rebound and no guarding.  Musculoskeletal: Normal range of motion. He exhibits no edema or tenderness.  Lymphadenopathy:    He has no cervical adenopathy.  Neurological: He is alert and oriented to person,  place, and time.  Skin: Skin is warm and dry. No rash noted. No erythema.  Psychiatric: Mood normal.    Results for orders placed or performed in visit on 10/29/17 (from the past 72 hour(s))  POCT glycosylated hemoglobin (Hb A1C)     Status: None   Collection Time: 10/29/17  2:10 PM  Result Value Ref Range   Hemoglobin A1C 6.8   Comprehensive metabolic panel     Status: Abnormal   Collection Time: 10/29/17  3:58 PM  Result Value Ref Range   Glucose 84 65 - 99 mg/dL   BUN 18 8 - 27 mg/dL   Creatinine, Ser 2.25 (H) 0.76 - 1.27 mg/dL   GFR calc non Af Amer 27 (L) >59 mL/min/1.73   GFR calc Af Amer 32 (L) >59 mL/min/1.73   BUN/Creatinine Ratio 8 (L) 10 - 24   Sodium 145 (H) 134 - 144 mmol/L   Potassium 4.6 3.5 - 5.2 mmol/L   Chloride 109 (H) 96 - 106 mmol/L   CO2 19 (L) 20 - 29 mmol/L   Calcium 9.5 8.6 - 10.2 mg/dL   Total Protein 6.4 6.0 - 8.5 g/dL   Albumin 3.9 3.5 - 4.8 g/dL   Globulin, Total 2.5 1.5 - 4.5 g/dL   Albumin/Globulin Ratio 1.6 1.2 - 2.2   Bilirubin Total 0.2 0.0 - 1.2 mg/dL   Alkaline Phosphatase 91 39 - 117 IU/L   AST 18 0 - 40 IU/L   ALT 17 0 - 44 IU/L    Assessment/Plan:  Type 2 diabetes mellitus without complication, without long-term current use of insulin (HCC) Educated patient on need take DM medications to avoid long term complications. Thankfully, his HgbA1c is under goal today at 6.8% however I discontinued Janumet due to its being contraindicated.   I started him on Januvia 74m due to his CKDIV and an eGFR of below 30.  Will follow up in one month to see how his BG has been doing and ensure patient is taking medication without complication.  Benign essential HTN Reduced Carvedilol from 12.569mto 6.2556mnce daily due to patient having possible side effect although I do not think its his Carvedilol causing the issue. Continue Losartaan 100m68mily. Continue Amlodipine 10mg22mly. Added HCTZ 25mg 83mdaily due to concern for consistent uncontrolled  HTN.   Obtain CMET to see if Creatinine and eGFR has improved since recent hospitalization for PNA.  Hyperlipidemia Refilled Rosuvastatin 20mg f76matient.     PATIENT EDUCATION PROVIDED: See AVS    Diagnosis and plan along with any newly prescribed medication(s) were discussed in detail with this patient today. The patient verbalized understanding and agreed with the plan. Patient advised if symptoms worsen return to clinic or ER.   Health Maintainance:   Orders Placed This Encounter  Procedures  . Comprehensive metabolic panel  . POCT glycosylated hemoglobin (Hb A1C)    Meds ordered this encounter  Medications  . DISCONTD: hydrochlorothiazide (HYDRODIURIL) 25 MG tablet    Sig: Take 1 tablet (25 mg total) by mouth daily.    Dispense:  90 tablet    Refill:  3  . rosuvastatin (CRESTOR) 20 MG tablet    Sig: Take 1 tablet (20 mg total) by mouth daily.    Dispense:  30 tablet    Refill:  2  . losartan (COZAAR) 100 MG tablet    Sig: Take 1 tablet (100 mg total) by mouth daily.    Dispense:  90 tablet    Refill:  3  . hydrochlorothiazide (HYDRODIURIL) 25 MG tablet    Sig: Take 1 tablet (25 mg total) by mouth daily.    Dispense:  90 tablet    Refill:  3  . sitaGLIPtin (JANUVIA) 25 MG tablet    Sig: Take 1 tablet (25 mg total) by mouth daily.    Dispense:  30 tablet    Refill:  2  . carvedilol (COREG) 6.25 MG tablet    Sig: Take 1 tablet (6.25 mg total) by mouth 2 (two) times daily with a meal.    Dispense:  30 tablet    Refill:  [place], DO 10/30/2017, 3:05 PM PGY-1, [place]

## 2017-10-30 DIAGNOSIS — E119 Type 2 diabetes mellitus without complications: Secondary | ICD-10-CM | POA: Insufficient documentation

## 2017-10-30 DIAGNOSIS — E1159 Type 2 diabetes mellitus with other circulatory complications: Secondary | ICD-10-CM | POA: Insufficient documentation

## 2017-10-30 DIAGNOSIS — E785 Hyperlipidemia, unspecified: Secondary | ICD-10-CM | POA: Insufficient documentation

## 2017-10-30 DIAGNOSIS — I152 Hypertension secondary to endocrine disorders: Secondary | ICD-10-CM

## 2017-10-30 DIAGNOSIS — E1169 Type 2 diabetes mellitus with other specified complication: Secondary | ICD-10-CM

## 2017-10-30 DIAGNOSIS — I10 Essential (primary) hypertension: Secondary | ICD-10-CM | POA: Insufficient documentation

## 2017-10-30 HISTORY — DX: Type 2 diabetes mellitus with other circulatory complications: E11.59

## 2017-10-30 HISTORY — DX: Type 2 diabetes mellitus with other specified complication: E11.69

## 2017-10-30 HISTORY — DX: Hypertension secondary to endocrine disorders: I15.2

## 2017-10-30 HISTORY — DX: Hyperlipidemia, unspecified: E78.5

## 2017-10-30 HISTORY — DX: Type 2 diabetes mellitus without complications: E11.9

## 2017-10-30 LAB — COMPREHENSIVE METABOLIC PANEL
ALT: 17 IU/L (ref 0–44)
AST: 18 IU/L (ref 0–40)
Albumin/Globulin Ratio: 1.6 (ref 1.2–2.2)
Albumin: 3.9 g/dL (ref 3.5–4.8)
Alkaline Phosphatase: 91 IU/L (ref 39–117)
BUN/Creatinine Ratio: 8 — ABNORMAL LOW (ref 10–24)
BUN: 18 mg/dL (ref 8–27)
Bilirubin Total: 0.2 mg/dL (ref 0.0–1.2)
CO2: 19 mmol/L — ABNORMAL LOW (ref 20–29)
Calcium: 9.5 mg/dL (ref 8.6–10.2)
Chloride: 109 mmol/L — ABNORMAL HIGH (ref 96–106)
Creatinine, Ser: 2.25 mg/dL — ABNORMAL HIGH (ref 0.76–1.27)
GFR calc Af Amer: 32 mL/min/{1.73_m2} — ABNORMAL LOW (ref >59)
GFR calc non Af Amer: 27 mL/min/{1.73_m2} — ABNORMAL LOW (ref >59)
Globulin, Total: 2.5 g/dL (ref 1.5–4.5)
Glucose: 84 mg/dL (ref 65–99)
Potassium: 4.6 mmol/L (ref 3.5–5.2)
Sodium: 145 mmol/L — ABNORMAL HIGH (ref 134–144)
Total Protein: 6.4 g/dL (ref 6.0–8.5)

## 2017-10-30 NOTE — Assessment & Plan Note (Signed)
Educated patient on need take DM medications to avoid long term complications. Thankfully, his HgbA1c is under goal today at 6.8% however I discontinued Janumet due to its being contraindicated.   I started him on Januvia 76m due to his CKDIV and an eGFR of below 30.  Will follow up in one month to see how his BG has been doing and ensure patient is taking medication without complication.

## 2017-10-30 NOTE — Assessment & Plan Note (Signed)
Refilled Rosuvastatin 20mg  for patient.

## 2017-10-30 NOTE — Assessment & Plan Note (Addendum)
Reduced Carvedilol from 12.70m to 6.245monce daily due to patient having possible side effect although I do not think its his Carvedilol causing the issue. Continue Losartaan 10026maily. Continue Amlodipine 3m30mily. Added HCTZ 25mg79m daily due to concern for consistent uncontrolled HTN.   Obtain CMET to see if Creatinine and eGFR has improved since recent hospitalization for PNA.

## 2017-11-01 ENCOUNTER — Telehealth: Payer: Self-pay | Admitting: *Deleted

## 2017-11-01 NOTE — Telephone Encounter (Signed)
Patient called back, states he had surgery to remove subdural hematoma  2 years ago in [place]. Now with 2 days of dizziness and is concerned hematoma may have returned. States a head x-ray was done on the 10/29/2017 along with CXR. No order or result in Epic for head imaging. Offered to schedule appt for dizziness. Pt declined at this time. States he will call surgeon in [place] to see what he advises. Hubbard Hartshorn, RN, BSN

## 2017-11-01 NOTE — Telephone Encounter (Signed)
Patient left message on nurse line requesting return call from PCP to discuss x-ray. Hubbard Hartshorn, RN, BSN

## 2017-11-03 ENCOUNTER — Telehealth: Payer: Self-pay | Admitting: Family Medicine

## 2017-11-03 NOTE — Telephone Encounter (Signed)
Pt would like to talk to dr Garlan Fillers.  He is having dizzy spells.  He is concerned it might be a related to a subdural hematoma he had years ago.  Please advise

## 2017-11-04 NOTE — Telephone Encounter (Signed)
Patient left message on nurse line requesting return call from PCP to discuss below. States this is his third call and would really like to speak with PCP about his concerns. Hubbard Hartshorn, RN, BSN

## 2017-11-05 NOTE — Telephone Encounter (Signed)
Tried calling patient to check on his status and set up an appointment if needed. There was no answer nor was there a voice mail.William [name], [place]

## 2017-11-09 NOTE — Telephone Encounter (Signed)
Called and left message for patient that if he was still having problems with dizziness, he could call and make an appointment to see Dr Garlan Fillers.Ozella Almond, CMA

## 2017-11-10 NOTE — Telephone Encounter (Signed)
Patient left message on nurse line requesting PCP order head CT so results can be sent to surgeon in [place] who performed surgery for subdural hematoma 2 years ago. Returned patient's call. He is concerned with length of time that has passed with potential subdural hematoma and does not want to wait any longer to schedule appt with PCP  to have head CT ordered. Discussed going to ED to be evaluated as he would be able to have testing as soon as possible. Patient is in agreement with this. Hubbard Hartshorn, RN, BSN

## 2017-11-15 NOTE — Telephone Encounter (Signed)
Appt 12/01/17. Fleeger, Salome Spotted, CMA

## 2017-12-01 ENCOUNTER — Ambulatory Visit: Payer: Medicare Other | Admitting: Family Medicine

## 2017-12-08 ENCOUNTER — Other Ambulatory Visit: Payer: Self-pay | Admitting: Family Medicine

## 2017-12-08 MED ORDER — ROSUVASTATIN CALCIUM 20 MG PO TABS: 20.0000 mg | ORAL_TABLET | Freq: Every day | ORAL | 0 refills | Status: DC

## 2017-12-08 NOTE — Progress Notes (Signed)
Received a fax that he needed a 90 day supply of Rosuvastatin 20mg . Updated his prescription and sent to pharmacy for 90 day supply.

## 2018-01-06 DIAGNOSIS — R2681 Unsteadiness on feet: Secondary | ICD-10-CM

## 2018-01-06 HISTORY — DX: Unsteadiness on feet: R26.81

## 2018-02-02 ENCOUNTER — Other Ambulatory Visit: Payer: Self-pay | Admitting: *Deleted

## 2018-02-04 ENCOUNTER — Telehealth: Payer: Self-pay | Admitting: *Deleted

## 2018-02-04 NOTE — Telephone Encounter (Signed)
LMOVM for pt to call us back. Deseree Blount, CMA  

## 2018-02-04 NOTE — Telephone Encounter (Signed)
-----   Message from [name], DO sent at 02/02/2018  2:16 PM EDT ----- Can we please call this patient and confirm he still plans on coming to our office to be seen? At our last appointment he informed me he was planning on moving back to [place].   I just don't want to prescribe him medications if he is moving and will transitioning his care. If he plans on staying here I will refill but he needs an appointment to be seen. Thanks!  Tim

## 2018-02-08 MED ORDER — SITAGLIPTIN PHOSPHATE 25 MG PO TABS: 25.0000 mg | ORAL_TABLET | Freq: Every day | ORAL | 2 refills | Status: DC

## 2018-02-24 ENCOUNTER — Other Ambulatory Visit: Payer: Self-pay

## 2018-02-24 MED ORDER — AMLODIPINE BESYLATE 5 MG PO TABS: 10.0000 mg | ORAL_TABLET | Freq: Every day | ORAL | 2 refills | Status: DC

## 2018-10-04 ENCOUNTER — Encounter (HOSPITAL_COMMUNITY): Payer: Self-pay

## 2018-10-04 ENCOUNTER — Emergency Department (HOSPITAL_COMMUNITY): Payer: Medicare Other

## 2018-10-04 ENCOUNTER — Other Ambulatory Visit: Payer: Self-pay

## 2018-10-04 ENCOUNTER — Inpatient Hospital Stay (HOSPITAL_COMMUNITY): Payer: Medicare Other

## 2018-10-04 ENCOUNTER — Inpatient Hospital Stay (HOSPITAL_COMMUNITY)
Admission: EM | Admit: 2018-10-04 | Discharge: 2018-10-21 | DRG: 674 | Disposition: A | Discharge status: Home Health | Payer: Medicare Other | Attending: Family Medicine | Admitting: Family Medicine

## 2018-10-04 DIAGNOSIS — Z7982 Long term (current) use of aspirin: Secondary | ICD-10-CM

## 2018-10-04 DIAGNOSIS — N179 Acute kidney failure, unspecified: Principal | ICD-10-CM | POA: Diagnosis present

## 2018-10-04 DIAGNOSIS — N281 Cyst of kidney, acquired: Secondary | ICD-10-CM | POA: Diagnosis not present

## 2018-10-04 DIAGNOSIS — E785 Hyperlipidemia, unspecified: Secondary | ICD-10-CM | POA: Diagnosis present

## 2018-10-04 DIAGNOSIS — I2583 Coronary atherosclerosis due to lipid rich plaque: Secondary | ICD-10-CM | POA: Diagnosis not present

## 2018-10-04 DIAGNOSIS — E875 Hyperkalemia: Secondary | ICD-10-CM | POA: Diagnosis present

## 2018-10-04 DIAGNOSIS — R197 Diarrhea, unspecified: Secondary | ICD-10-CM | POA: Diagnosis not present

## 2018-10-04 DIAGNOSIS — I1311 Hypertensive heart and chronic kidney disease without heart failure, with stage 5 chronic kidney disease, or end stage renal disease: Secondary | ICD-10-CM | POA: Diagnosis present

## 2018-10-04 DIAGNOSIS — Z789 Other specified health status: Secondary | ICD-10-CM

## 2018-10-04 DIAGNOSIS — E876 Hypokalemia: Secondary | ICD-10-CM | POA: Diagnosis present

## 2018-10-04 DIAGNOSIS — Z79899 Other long term (current) drug therapy: Secondary | ICD-10-CM

## 2018-10-04 DIAGNOSIS — M6282 Rhabdomyolysis: Secondary | ICD-10-CM | POA: Diagnosis not present

## 2018-10-04 DIAGNOSIS — I160 Hypertensive urgency: Secondary | ICD-10-CM

## 2018-10-04 DIAGNOSIS — I120 Hypertensive chronic kidney disease with stage 5 chronic kidney disease or end stage renal disease: Secondary | ICD-10-CM | POA: Diagnosis not present

## 2018-10-04 DIAGNOSIS — N184 Chronic kidney disease, stage 4 (severe): Secondary | ICD-10-CM

## 2018-10-04 DIAGNOSIS — A084 Viral intestinal infection, unspecified: Secondary | ICD-10-CM | POA: Diagnosis not present

## 2018-10-04 DIAGNOSIS — Z01810 Encounter for preprocedural cardiovascular examination: Secondary | ICD-10-CM | POA: Diagnosis not present

## 2018-10-04 DIAGNOSIS — F0390 Unspecified dementia without behavioral disturbance: Secondary | ICD-10-CM | POA: Diagnosis present

## 2018-10-04 DIAGNOSIS — E872 Acidosis: Secondary | ICD-10-CM | POA: Diagnosis present

## 2018-10-04 DIAGNOSIS — D631 Anemia in chronic kidney disease: Secondary | ICD-10-CM | POA: Diagnosis present

## 2018-10-04 DIAGNOSIS — K5900 Constipation, unspecified: Secondary | ICD-10-CM | POA: Diagnosis present

## 2018-10-04 DIAGNOSIS — I472 Ventricular tachycardia: Secondary | ICD-10-CM | POA: Diagnosis not present

## 2018-10-04 DIAGNOSIS — E1122 Type 2 diabetes mellitus with diabetic chronic kidney disease: Secondary | ICD-10-CM | POA: Diagnosis present

## 2018-10-04 DIAGNOSIS — R410 Disorientation, unspecified: Secondary | ICD-10-CM | POA: Diagnosis not present

## 2018-10-04 DIAGNOSIS — I471 Supraventricular tachycardia: Secondary | ICD-10-CM | POA: Diagnosis present

## 2018-10-04 DIAGNOSIS — I517 Cardiomegaly: Secondary | ICD-10-CM | POA: Diagnosis present

## 2018-10-04 DIAGNOSIS — I129 Hypertensive chronic kidney disease with stage 1 through stage 4 chronic kidney disease, or unspecified chronic kidney disease: Secondary | ICD-10-CM | POA: Diagnosis not present

## 2018-10-04 DIAGNOSIS — I442 Atrioventricular block, complete: Secondary | ICD-10-CM | POA: Diagnosis not present

## 2018-10-04 DIAGNOSIS — Y92003 Bedroom of unspecified non-institutional (private) residence as the place of occurrence of the external cause: Secondary | ICD-10-CM

## 2018-10-04 DIAGNOSIS — W06XXXA Fall from bed, initial encounter: Secondary | ICD-10-CM | POA: Diagnosis present

## 2018-10-04 DIAGNOSIS — E11649 Type 2 diabetes mellitus with hypoglycemia without coma: Secondary | ICD-10-CM | POA: Diagnosis present

## 2018-10-04 DIAGNOSIS — Z888 Allergy status to other drugs, medicaments and biological substances status: Secondary | ICD-10-CM

## 2018-10-04 DIAGNOSIS — N185 Chronic kidney disease, stage 5: Secondary | ICD-10-CM | POA: Diagnosis not present

## 2018-10-04 DIAGNOSIS — R69 Illness, unspecified: Secondary | ICD-10-CM | POA: Diagnosis not present

## 2018-10-04 DIAGNOSIS — R4182 Altered mental status, unspecified: Secondary | ICD-10-CM | POA: Diagnosis not present

## 2018-10-04 DIAGNOSIS — Z794 Long term (current) use of insulin: Secondary | ICD-10-CM

## 2018-10-04 DIAGNOSIS — I161 Hypertensive emergency: Secondary | ICD-10-CM | POA: Diagnosis present

## 2018-10-04 DIAGNOSIS — Z955 Presence of coronary angioplasty implant and graft: Secondary | ICD-10-CM

## 2018-10-04 DIAGNOSIS — I351 Nonrheumatic aortic (valve) insufficiency: Secondary | ICD-10-CM | POA: Diagnosis not present

## 2018-10-04 DIAGNOSIS — E860 Dehydration: Secondary | ICD-10-CM | POA: Diagnosis present

## 2018-10-04 DIAGNOSIS — E119 Type 2 diabetes mellitus without complications: Secondary | ICD-10-CM | POA: Diagnosis not present

## 2018-10-04 DIAGNOSIS — Z7189 Other specified counseling: Secondary | ICD-10-CM | POA: Diagnosis not present

## 2018-10-04 DIAGNOSIS — I10 Essential (primary) hypertension: Secondary | ICD-10-CM | POA: Diagnosis not present

## 2018-10-04 DIAGNOSIS — I4729 Other ventricular tachycardia: Secondary | ICD-10-CM

## 2018-10-04 DIAGNOSIS — Z66 Do not resuscitate: Secondary | ICD-10-CM | POA: Diagnosis present

## 2018-10-04 DIAGNOSIS — K219 Gastro-esophageal reflux disease without esophagitis: Secondary | ICD-10-CM | POA: Diagnosis present

## 2018-10-04 DIAGNOSIS — E871 Hypo-osmolality and hyponatremia: Secondary | ICD-10-CM | POA: Diagnosis not present

## 2018-10-04 DIAGNOSIS — Z515 Encounter for palliative care: Secondary | ICD-10-CM

## 2018-10-04 DIAGNOSIS — Z950 Presence of cardiac pacemaker: Secondary | ICD-10-CM | POA: Diagnosis not present

## 2018-10-04 DIAGNOSIS — I2510 Atherosclerotic heart disease of native coronary artery without angina pectoris: Secondary | ICD-10-CM | POA: Diagnosis not present

## 2018-10-04 DIAGNOSIS — N186 End stage renal disease: Secondary | ICD-10-CM | POA: Diagnosis not present

## 2018-10-04 DIAGNOSIS — R7989 Other specified abnormal findings of blood chemistry: Secondary | ICD-10-CM | POA: Diagnosis not present

## 2018-10-04 DIAGNOSIS — L89151 Pressure ulcer of sacral region, stage 1: Secondary | ICD-10-CM | POA: Diagnosis present

## 2018-10-04 DIAGNOSIS — R0789 Other chest pain: Secondary | ICD-10-CM | POA: Diagnosis not present

## 2018-10-04 HISTORY — DX: Cyst of kidney, acquired: N28.1

## 2018-10-04 HISTORY — DX: Traumatic subdural hemorrhage with loss of consciousness of unspecified duration, initial encounter: S06.5X9A

## 2018-10-04 HISTORY — DX: Pure hypercholesterolemia, unspecified: E78.00

## 2018-10-04 HISTORY — DX: Type 2 diabetes mellitus without complications: E11.9

## 2018-10-04 LAB — COMPREHENSIVE METABOLIC PANEL
ALT: 15 U/L (ref 0–44)
AST: 30 U/L (ref 15–41)
Albumin: 3.6 g/dL (ref 3.5–5.0)
Alkaline Phosphatase: 80 U/L (ref 38–126)
Anion gap: 16 — ABNORMAL HIGH (ref 5–15)
BUN: 62 mg/dL — ABNORMAL HIGH (ref 8–23)
CO2: 16 mmol/L — ABNORMAL LOW (ref 22–32)
Calcium: 8.9 mg/dL (ref 8.9–10.3)
Chloride: 106 mmol/L (ref 98–111)
Creatinine, Ser: 5.26 mg/dL — ABNORMAL HIGH (ref 0.61–1.24)
GFR calc Af Amer: 11 mL/min — ABNORMAL LOW (ref >60)
GFR calc non Af Amer: 10 mL/min — ABNORMAL LOW (ref >60)
Glucose, Bld: 156 mg/dL — ABNORMAL HIGH (ref 70–99)
Potassium: 5.1 mmol/L (ref 3.5–5.1)
Sodium: 138 mmol/L (ref 135–145)
Total Bilirubin: 1.3 mg/dL — ABNORMAL HIGH (ref 0.3–1.2)
Total Protein: 7.3 g/dL (ref 6.5–8.1)

## 2018-10-04 LAB — LIPASE, BLOOD: Lipase: 46 U/L (ref 11–51)

## 2018-10-04 LAB — CBC WITH DIFFERENTIAL/PLATELET
Abs Immature Granulocytes: 0.07 10*3/uL (ref 0.00–0.07)
Basophils Absolute: 0 10*3/uL (ref 0.0–0.1)
Basophils Relative: 0 %
Eosinophils Absolute: 0 10*3/uL (ref 0.0–0.5)
Eosinophils Relative: 0 %
HCT: 42.9 % (ref 39.0–52.0)
Hemoglobin: 14.3 g/dL (ref 13.0–17.0)
Immature Granulocytes: 0 %
Lymphocytes Relative: 7 %
Lymphs Abs: 1.1 10*3/uL (ref 0.7–4.0)
MCH: 32.3 pg (ref 26.0–34.0)
MCHC: 33.3 g/dL (ref 30.0–36.0)
MCV: 96.8 fL (ref 80.0–100.0)
Monocytes Absolute: 1.2 10*3/uL — ABNORMAL HIGH (ref 0.1–1.0)
Monocytes Relative: 7 %
Neutro Abs: 13.7 10*3/uL — ABNORMAL HIGH (ref 1.7–7.7)
Neutrophils Relative %: 86 %
Platelets: 326 10*3/uL (ref 150–400)
RBC: 4.43 MIL/uL (ref 4.22–5.81)
RDW: 12.7 % (ref 11.5–15.5)
WBC: 16.1 10*3/uL — ABNORMAL HIGH (ref 4.0–10.5)
nRBC: 0 % (ref 0.0–0.2)

## 2018-10-04 LAB — I-STAT TROPONIN, ED: Troponin i, poc: 0.03 ng/mL (ref 0.00–0.08)

## 2018-10-04 LAB — CBG MONITORING, ED
Glucose-Capillary: 129 mg/dL — ABNORMAL HIGH (ref 70–99)
Glucose-Capillary: 119 mg/dL — ABNORMAL HIGH (ref 70–99)

## 2018-10-04 LAB — I-STAT CG4 LACTIC ACID, ED
Lactic Acid, Venous: 2.62 mmol/L (ref 0.5–1.9)
Lactic Acid, Venous: 1.69 mmol/L (ref 0.5–1.9)

## 2018-10-04 LAB — AMMONIA: Ammonia: 16 umol/L (ref 9–35)

## 2018-10-04 LAB — TSH: TSH: 0.789 u[IU]/mL (ref 0.350–4.500)

## 2018-10-04 LAB — CK: Total CK: 500 U/L — ABNORMAL HIGH (ref 49–397)

## 2018-10-04 MED ORDER — SODIUM CHLORIDE 0.9 % IV SOLN
INTRAVENOUS | Status: DC
  Administered 2018-10-04 – 2018-10-06 (×5): via INTRAVENOUS

## 2018-10-04 MED ORDER — CARVEDILOL 12.5 MG PO TABS: 6.2500 mg | ORAL_TABLET | Freq: Two times a day (BID) | ORAL | Status: DC

## 2018-10-04 MED ORDER — SODIUM CHLORIDE 0.9 % IV BOLUS
1000.0000 mL | Freq: Once | INTRAVENOUS | Status: AC
  Administered 2018-10-04: 1000 mL via INTRAVENOUS

## 2018-10-04 MED ORDER — ACETAMINOPHEN 650 MG RE SUPP: 650.0000 mg | Freq: Four times a day (QID) | RECTAL | Status: DC | PRN

## 2018-10-04 MED ORDER — ENOXAPARIN SODIUM 30 MG/0.3ML ~~LOC~~ SOLN
30.0000 mg | SUBCUTANEOUS | Status: DC
  Administered 2018-10-05 – 2018-10-12 (×8): 30 mg via SUBCUTANEOUS
  Filled 2018-10-04 (×9): qty 0.3

## 2018-10-04 MED ORDER — ACETAMINOPHEN 325 MG PO TABS
650.0000 mg | ORAL_TABLET | Freq: Four times a day (QID) | ORAL | Status: DC | PRN
  Administered 2018-10-07 – 2018-10-21 (×2): 650 mg via ORAL
  Filled 2018-10-04 (×3): qty 2

## 2018-10-04 MED ORDER — INSULIN ASPART 100 UNIT/ML ~~LOC~~ SOLN
0.0000 [IU] | Freq: Three times a day (TID) | SUBCUTANEOUS | Status: DC
  Administered 2018-10-08 – 2018-10-09 (×4): 1 [IU] via SUBCUTANEOUS
  Administered 2018-10-10 – 2018-10-11 (×3): 2 [IU] via SUBCUTANEOUS
  Administered 2018-10-13: 1 [IU] via SUBCUTANEOUS
  Administered 2018-10-13 – 2018-10-14 (×2): 3 [IU] via SUBCUTANEOUS
  Administered 2018-10-15 – 2018-10-16 (×2): 2 [IU] via SUBCUTANEOUS
  Administered 2018-10-16 (×2): 1 [IU] via SUBCUTANEOUS
  Administered 2018-10-17: 2 [IU] via SUBCUTANEOUS
  Administered 2018-10-18: 1 [IU] via SUBCUTANEOUS

## 2018-10-04 MED ORDER — CARVEDILOL 6.25 MG PO TABS
6.2500 mg | ORAL_TABLET | Freq: Two times a day (BID) | ORAL | Status: DC
  Administered 2018-10-04 – 2018-10-05 (×3): 6.25 mg via ORAL
  Filled 2018-10-04 (×3): qty 1

## 2018-10-04 MED ORDER — AMLODIPINE BESYLATE 10 MG PO TABS
10.0000 mg | ORAL_TABLET | Freq: Every day | ORAL | Status: DC
  Administered 2018-10-05 – 2018-10-21 (×17): 10 mg via ORAL
  Filled 2018-10-04 (×17): qty 1

## 2018-10-04 MED ORDER — HYDRALAZINE HCL 20 MG/ML IJ SOLN
10.0000 mg | INTRAMUSCULAR | Status: DC | PRN
  Administered 2018-10-05 – 2018-10-06 (×4): 10 mg via INTRAVENOUS
  Filled 2018-10-04 (×4): qty 1

## 2018-10-04 MED ORDER — ROSUVASTATIN CALCIUM 5 MG PO TABS
10.0000 mg | ORAL_TABLET | Freq: Every day | ORAL | Status: DC
  Administered 2018-10-04 – 2018-10-07 (×4): 10 mg via ORAL
  Filled 2018-10-04 (×4): qty 2

## 2018-10-04 MED ORDER — HYDRALAZINE HCL 20 MG/ML IJ SOLN
5.0000 mg | INTRAMUSCULAR | Status: DC | PRN
  Administered 2018-10-04: 5 mg via INTRAVENOUS
  Filled 2018-10-04: qty 1

## 2018-10-04 NOTE — Progress Notes (Signed)
Received report from ED RN.

## 2018-10-04 NOTE — ED Notes (Signed)
NT checked with pt about being able to urinate. Pt states he does not have the need to urinate at this time.

## 2018-10-04 NOTE — ED Triage Notes (Signed)
Pt from home w/ daughter w/ a c/o AMS, feeling sick, and loss of appetite. Pt reports he has not been feeling well since last Friday. Daughter states she believes he has had "the runs" and is possibly dehydrated, and has been this way for the past three days. Pt has had difficulty standing today and was shuffling his feet. Family states that the pt has had a decline in his memory over the past year. The pt will at times present with a blank stare and have difficulty with directions and following commands.

## 2018-10-04 NOTE — ED Notes (Signed)
DO Beard contacted and informed of the pt's BP.

## 2018-10-04 NOTE — ED Notes (Signed)
[employer] stated that the pt has had both atrial and ventricular arrhythmias in the recent past but nothing today.

## 2018-10-04 NOTE — ED Notes (Signed)
Patient transported to X-ray and then to CT.  
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
## 2018-10-05 ENCOUNTER — Inpatient Hospital Stay (HOSPITAL_COMMUNITY): Payer: Medicare Other

## 2018-10-05 DIAGNOSIS — I351 Nonrheumatic aortic (valve) insufficiency: Secondary | ICD-10-CM

## 2018-10-05 DIAGNOSIS — R4182 Altered mental status, unspecified: Secondary | ICD-10-CM

## 2018-10-05 DIAGNOSIS — N179 Acute kidney failure, unspecified: Principal | ICD-10-CM

## 2018-10-05 LAB — CBC
HCT: 27.5 % — ABNORMAL LOW (ref 39.0–52.0)
HCT: 37.9 % — ABNORMAL LOW (ref 39.0–52.0)
Hemoglobin: 9 g/dL — ABNORMAL LOW (ref 13.0–17.0)
Hemoglobin: 12.6 g/dL — ABNORMAL LOW (ref 13.0–17.0)
MCH: 30.3 pg (ref 26.0–34.0)
MCH: 31.3 pg (ref 26.0–34.0)
MCHC: 32.7 g/dL (ref 30.0–36.0)
MCHC: 33.2 g/dL (ref 30.0–36.0)
MCV: 92.6 fL (ref 80.0–100.0)
MCV: 94.3 fL (ref 80.0–100.0)
Platelets: 246 10*3/uL (ref 150–400)
Platelets: 287 10*3/uL (ref 150–400)
RBC: 2.97 MIL/uL — ABNORMAL LOW (ref 4.22–5.81)
RBC: 4.02 MIL/uL — ABNORMAL LOW (ref 4.22–5.81)
RDW: 15.3 % (ref 11.5–15.5)
RDW: 12.7 % (ref 11.5–15.5)
WBC: 5 10*3/uL (ref 4.0–10.5)
WBC: 10.4 10*3/uL (ref 4.0–10.5)
nRBC: 0 % (ref 0.0–0.2)
nRBC: 0 % (ref 0.0–0.2)

## 2018-10-05 LAB — BASIC METABOLIC PANEL WITH GFR
Anion gap: 12 (ref 5–15)
BUN: 55 mg/dL — ABNORMAL HIGH (ref 8–23)
CO2: 16 mmol/L — ABNORMAL LOW (ref 22–32)
Calcium: 8.4 mg/dL — ABNORMAL LOW (ref 8.9–10.3)
Chloride: 109 mmol/L (ref 98–111)
Creatinine, Ser: 4.4 mg/dL — ABNORMAL HIGH (ref 0.61–1.24)
GFR calc Af Amer: 14 mL/min — ABNORMAL LOW
GFR calc non Af Amer: 12 mL/min — ABNORMAL LOW
Glucose, Bld: 95 mg/dL (ref 70–99)
Potassium: 4 mmol/L (ref 3.5–5.1)
Sodium: 137 mmol/L (ref 135–145)

## 2018-10-05 LAB — PHOSPHORUS: Phosphorus: 3 mg/dL (ref 2.5–4.6)

## 2018-10-05 LAB — RAPID URINE DRUG SCREEN, HOSP PERFORMED
Amphetamines: NOT DETECTED
Barbiturates: NOT DETECTED
Benzodiazepines: NOT DETECTED
Cocaine: NOT DETECTED
Opiates: NOT DETECTED
Tetrahydrocannabinol: NOT DETECTED

## 2018-10-05 LAB — ECHOCARDIOGRAM COMPLETE
Height: 67 in
Weight: 2880 oz

## 2018-10-05 LAB — URINALYSIS, ROUTINE W REFLEX MICROSCOPIC
Bilirubin Urine: NEGATIVE
Glucose, UA: 50 mg/dL — AB
Ketones, ur: NEGATIVE mg/dL
Leukocytes, UA: NEGATIVE
Nitrite: NEGATIVE
Protein, ur: 100 mg/dL — AB
Specific Gravity, Urine: 1.014 (ref 1.005–1.030)
pH: 5 (ref 5.0–8.0)

## 2018-10-05 LAB — GLUCOSE, CAPILLARY
Glucose-Capillary: 82 mg/dL (ref 70–99)
Glucose-Capillary: 122 mg/dL — ABNORMAL HIGH (ref 70–99)
Glucose-Capillary: 106 mg/dL — ABNORMAL HIGH (ref 70–99)
Glucose-Capillary: 103 mg/dL — ABNORMAL HIGH (ref 70–99)

## 2018-10-05 LAB — SODIUM, URINE, RANDOM: Sodium, Ur: 63 mmol/L

## 2018-10-05 LAB — CREATININE, URINE, RANDOM: Creatinine, Urine: 124.93 mg/dL

## 2018-10-05 LAB — MAGNESIUM: Magnesium: 1.9 mg/dL (ref 1.7–2.4)

## 2018-10-05 MED ORDER — PROCHLORPERAZINE EDISYLATE 10 MG/2ML IJ SOLN
10.0000 mg | Freq: Four times a day (QID) | INTRAMUSCULAR | Status: DC | PRN
  Administered 2018-10-05 – 2018-10-07 (×2): 10 mg via INTRAVENOUS
  Filled 2018-10-05 (×2): qty 2

## 2018-10-05 MED ORDER — PANTOPRAZOLE SODIUM 20 MG PO TBEC
20.0000 mg | DELAYED_RELEASE_TABLET | Freq: Every day | ORAL | Status: DC
  Administered 2018-10-05 – 2018-10-21 (×17): 20 mg via ORAL
  Filled 2018-10-05 (×17): qty 1

## 2018-10-05 NOTE — Progress Notes (Signed)
New Admission Note:  Arrival Method: Via stretcher from ED Mental Orientation: Alert and Oriented x2-3 Telemetry: CCMD verifed Assessment: Completed Skin: Refer to flowsheet IV: Right Hand and Left AC Pain: 0/10 Safety Measures: Safety Fall Prevention Plan discussed with patient. Admission: Completed [facility] [place] Orientation: Patient has been orientated to the room, unit and the staff.  Orders have been reviewed and are being implemented. Will continue to monitor the patient. Call light has been placed within reach and bed alarm has been activated.   Vassie Moselle, RN  Phone Number: [phone]

## 2018-10-05 NOTE — Discharge Summary (Signed)
[facility] Discharge Summary  Patient name: William [name] Medical record number: [id number] Date of birth: Feb 14, 1941 Age: 77 y.o. Gender: male Date of Admission: 10/04/2018  Date of Discharge: 10/21/2018 Admitting Physician: Kathrene Alu, MD  Primary Care Provider: Patient, No Pcp Per Consultants: cardiology, nephrology, palliative  Indication for Hospitalization: AMS, AKI, hypertensive urgency  Discharge Diagnoses/Problem List:  Altered mental status AKI Hypertensive urgency CKD 3 BUN Type 2 diabetes Hypertension CAD s/p PCI Hyperlipidemia Complete heart block with pacemaker  Disposition: Home with Home Health  Discharge Condition: Stable  Discharge Exam:  Physical Exam: General: 77 y.o. male in NAD Cardio: RRR no m/r/g Lungs: CTAB, no wheezing, no rhonchi, no crackles Abdomen: Soft, non-tender to palpation, positive bowel sounds Skin: warm and dry Extremities: No edema   Brief Hospital Course:  William [name] is a 77 year old male admitted with AKI, altered mental status, and hypertensive urgency in the setting of diarrhea.  In the emergency department he was hemodynamically stable except for blood pressure 223/142. CT head was performed and showed no acute intracranial findings and chest x-ray ruled out cardiopulmonary processes. Notably creatinine was elevated to 5.26, patient's previous creatinine had been about 2.25 1-year prior.  Hypertensive urgency: Blood pressure on arrival was 223/142.  He was started on home Coreg and amlodipine as well as hydralazine 5 mg every 4.  Additionally labetalol 400 mg twice daily was added and his blood pressures continue to improve. During hospitalization, the patient had an episode of chest pain on 10/05/2018, which he described as "pressure".  Shortly after the pain started he vomited and immediately felt better.  He was started on Compazine for nausea and Protonix.  EKG performed at the time was  unchanged from previous EKG.  Echo showed ejection fraction 65 to 70% with grade 2 diastolic dysfunction .  Further adjustments were made to his hypertension medications in conjunction with cardiology.  At the time of discharge BP was well-controlled and patient was discharged on regimen of amlodipine 10 mg daily, hydralazine 100 mg 3 times daily, Lopressor 150 mg twice daily, and clonidine 0.1mg  BID.  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  Baseline creatinine around 2.2.  Admission creatinine about 5.  After multiple days of hydration creatinine was barely better than 4.  Protein creatinine ratio 5440 mg/g. At that time nephrology began to consider vascular access for dialysis.  Working diagnosis is likely progression of CKD secondary to diabetes and hypertension.  SPEP and UPEP are pending.  Palliative discussion with patient and family to consider vascular access for dialysis was not definitive.  Patient later changed his mind and stated that he was amiable to HD.  Nephrology noted that patient did not emergency need dialysis at this time, but since patient remained inpatient planned for vein mapping.  Patient had graft placed in left arm on 1/9.  Per vascular, can access graft in 4 weeks.  Altered mental status: Family had noted progressive dementia going on for some time although patient appeared to be acutely worsened on admission.  CVA, infection and medications have been ruled out as possible causes of acutely worsened AMS.  Throughout most of the hospitalization, patient was alert and oriented to self and year though rarely oriented to place (did not understand he was at hospital to receive medical care).  [place] showed a score of 11/30.  Despite his lack of orientation, he remained remarkably interactive and conversational.  It would  be easy to miss the severity of his dementia without specific probing.  His altered mental status is likely due to progression  of his dementia.  At this point, reversible causes are not suspected.  Left ventricular hypertrophy, ventricular tachycardia: He has a history of heart block with a pacemaker.  During his hospitalization he has had multiple short episodes of V. tach (3-7 beats) which resolved spontaneously.  Cardiology had been following and initially recommended only metoprolol for rate control which was later broadened to include amiodarone.  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  Technetium 99 (PYP) imaging was performed to assess for amyloid, results were inconclusive.  Given that the patient was resistant to hemodialysis, cardiology opted to treat medically as he is therefore not a candidate for cardiac cath.  Recommended continuation of amiodarone 400 mg daily and follow-up with Dr. Radford Pax or extender in 1 to 2 weeks in office.  At the time of discharge, patient had stable vitals without chest pain.  Issues for Follow Up:  1. Consider contrast-enhanced CT or MRI to further evaluate cystic lesion of left kidney on an elective, nonemergent basis. 2. Nephro will continue to follow and HD will likely be initiated as outpatient.  Ensure that patient follows up with nephro. 3. May need hospice involvement as outpatient. 4. Will need to follow up with Dr. Radford Pax or extender in office in 1-2 weeks post-discharge. 5. Monitor BP.  Patient's hypertension medications were adjusted given kidney disease.  He was started on clonidine 0.1mg  BID prior to DC as pressures remained elevated and HR would likely not tolerate BB adjustment (was in 60s). 6. Patient's A1c was 6.3 and he experienced hypoglycemia during admission on SSI.  He was not discharged home on diabetic medication as he was well-controlled without diabetic intervention at the time of discharge and with A1c, lifestyle modification is also appropriate.  Consider starting Tradjenta as outpatient, as is not renally  excreted.  Significant Procedures:  Echo 12/25 -LVEF 65 to 70%, G1DD, mild aortic stenosis Renal ultrasound 10/05/2018 - renal parenchymal echogenicity with concern for medical renal disease.  Bilateral renal cysts.  Somewhat complex 3.2 cm cystic lesion of the lower pole the left kidney with septation and question of a peripheral soft tissue component.  T-99 scan indeterminate for amyloidosis   HD Graft Placement 1/10  Significant Labs and Imaging:  Recent Labs  Lab 10/19/18 0944 10/20/18 0749 10/21/18 0749  WBC 8.4 6.9 9.3  HGB 12.8* 11.0* 10.4*  HCT 37.6* 32.7* 31.8*  PLT PLATELET CLUMPS NOTED ON SMEAR, UNABLE TO ESTIMATE 332 301   Recent Labs  Lab 10/15/18 0714 10/17/18 0715 10/18/18 0713 10/19/18 0944 10/20/18 0749 10/21/18 0749  NA 139 137 137 134* 137 135  K 4.1 4.5 3.6 5.5* 4.2 4.4  CL 108 108 103 102 107 106  CO2 24 22 22 23 23  20*  GLUCOSE 109* 121* 138* 112* 122* 86  BUN 45* 46* 43* 48* 49* 47*  CREATININE 4.86* 5.02* 5.19* 5.39* 5.48* 5.64*  CALCIUM 8.9 8.8* 8.5* 8.8* 8.5* 8.1*  PHOS 3.0  --   --   --   --   --   ALBUMIN 2.6*  --   --   --   --   --    Urinalysis    Component Value Date/Time   COLORURINE YELLOW 10/05/2018 [place] 10/05/2018 1102   LABSPEC 1.014 10/05/2018 1102   PHURINE 5.0 10/05/2018 1102   GLUCOSEU  50 (A) 10/05/2018 1102   HGBUR SMALL (A) 10/05/2018 1102   BILIRUBINUR NEGATIVE 10/05/2018 1102   KETONESUR NEGATIVE 10/05/2018 1102   PROTEINUR 100 (A) 10/05/2018 1102   NITRITE NEGATIVE 10/05/2018 1102   LEUKOCYTESUR NEGATIVE 10/05/2018 1102   FENa 1.6% Magnesium 1.9 Phosphorus 3.0 Lactic acid 2.62 > 1.69 TSH 0.789 Ammonia 16 Lipase 46 CK 500  Results/Tests Pending at Time of Discharge: None  Discharge Medications:  Allergies as of 10/21/2018      Reactions   Carvedilol Other (See Comments)   Makes him feel like his pelvis is "grinding"      Medication List    STOP taking these medications    carvedilol 6.25 MG tablet Commonly known as:  COREG   dexlansoprazole 60 MG capsule Commonly known as:  DEXILANT   hydrochlorothiazide 25 MG tablet Commonly known as:  HYDRODIURIL   losartan 100 MG tablet Commonly known as:  COZAAR   sitaGLIPtin-metformin 50-500 MG tablet Commonly known as:  JANUMET     TAKE these medications   amiodarone 400 MG tablet Commonly known as:  PACERONE Take 1 tablet (400 mg total) by mouth daily. Start taking on:  October 22, 2018   amLODipine 5 MG tablet Commonly known as:  NORVASC Take 2 tablets (10 mg total) by mouth daily.   aspirin EC 81 MG tablet Take 81 mg by mouth daily.   cloNIDine 0.1 MG tablet Commonly known as:  CATAPRES Take 1 tablet (0.1 mg total) by mouth 2 (two) times daily.   feeding supplement (ENSURE ENLIVE) Liqd Take 237 mLs by mouth 2 (two) times daily between meals. Start taking on:  October 22, 2018   hydrALAZINE 100 MG tablet Commonly known as:  APRESOLINE Take 1 tablet (100 mg total) by mouth 3 (three) times daily. What changed:    medication strength  how much to take   Metoprolol Tartrate 75 MG Tabs Take 150 mg by mouth 2 (two) times daily.   pantoprazole 20 MG tablet Commonly known as:  PROTONIX Take 1 tablet (20 mg total) by mouth daily. Start taking on:  October 22, 2018   polyethylene glycol packet Commonly known as:  MIRALAX / GLYCOLAX Take 17 g by mouth daily. Start taking on:  October 22, 2018   rosuvastatin 10 MG tablet Commonly known as:  CRESTOR Take 1 tablet (10 mg total) by mouth daily at 6 PM. What changed:    medication strength  how much to take  when to take this   sodium bicarbonate 650 MG tablet Take 1 tablet (650 mg total) by mouth 2 (two) times daily.            Durable Medical Equipment  (From admission, onward)         Start     Ordered   10/21/18 1509  For home use only DME Walker rolling  Once    Question:  Patient needs a walker to treat with the  following condition  Answer:  Weakness   10/21/18 1509   10/21/18 0947  For home use only DME 3 n 1  Once     10/21/18 0946          Discharge Instructions: Please refer to Patient Instructions section of EMR for full details.  Patient was counseled important signs and symptoms that should prompt return to medical care, changes in medications, dietary instructions, activity restrictions, and follow up appointments.   Follow-Up Appointments:  Contact information for follow-up providers  Burtis Junes, NP Follow up on 10/31/2018.   Specialties:  Nurse Practitioner, Interventional Cardiology, Cardiology, Radiology Why:  Please arrive 15 minutes early for your post-hospital cardiology follow-up appointment Contact information: [place]. [address] [place] [address] [phone]        [name], DO. Go on 11/01/2018.   Specialty:  Family Medicine Why:  @1 :50PM (Please arrive 15 min early) Contact information: [address] [address] [place] Follow up.   Why:  Rolling walker Contact information: [address] [place] [address] [phone]        [facility], [facility] Follow up.   Specialty:  [place] Why:  physical therapy and occupational therapy Contact information: [address] [place] [address] [phone]            Contact information for after-discharge care    Destination    [facility] .   Service:  Skilled Nursing Contact information: [place] Julian, [place], DO 10/21/2018, 4:01 PM PGY-1, [place]

## 2018-10-05 NOTE — Progress Notes (Signed)
  Echocardiogram 2D Echocardiogram has been performed.  William [name] 10/05/2018, 3:09 PM

## 2018-10-05 NOTE — Progress Notes (Signed)
Family Medicine Teaching Service Daily Progress Note Intern Pager: [phone]  Patient name: MASAICHI KRACHT Medical record number: [id number] Date of birth: 07-22-1941 Age: 77 y.o. Gender: male  Primary Care Provider: Patient, No Pcp Per Consultants: None Code Status: DNR  Pt Overview and Major Events to Date:  10/04/2018-patient admitted for headache, fall  Assessment and Plan: Dareld Mcauliffe is a 77 year old male presenting after a fall with a headache, found to have altered mental status, AKI, and hypertensive urgency.  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  AKI on CKD stage 3B: Cr 5.26 on admit improved to 4.4 on 12/25. Cr 2.25 in 10/2017, with a previous baseline prior to this around 1.6-1.7. Likely in the setting of dehydration with diarrhea as below, however quite a substantial increase for dehydration alone.  Renal ultrasound also suggesting medical kidney disease. Uncontrolled hypertension contributing as well. Patient denies history of urinary retention). UA showing small hgb, 100 protein, 50 glucose, rare bacteria, otherwise wnl. FENa 1.6% suggesting intrinsic cause of AKI. -Monitor BMP -mIVF 125 ml/hr NS  -Avoid nephrotoxic medications as possible, holding home losartan and HCTZ  -Obtain FENa labs - results still pending -Strict intake and output: Patient has only produced 350 cc urine since admission per recorded I&O's -Consider nephrology consult if not improving  ?AMS in setting of recent fall, Acute: patient is alert and oriented x3,  but family states he has been more confused recently and showing mental decline over the last year, often getting lost and misplacing objects. CT head without acute intracranial findings.  Ammonia 16, TSH 0.78.  Electrolytes, glucose WNL. BUN 62, appeared dehydrated on arrival.  Phosphorus 3, mag 1.9. UDS negative. UA showing small  hgb, 100 protein, 50 glucose, rare bacteria, otherwise wnl.  -mIVF 125 ml/hr  -Monitor BMP, CBC -Urine culture - pending  Diarrhea, Acute, resolved: Patient reported 7-day history of nonbloody diarrhea but has had no episodes since admission. Afebrile, leukocytosis to 16.1 on amission but very low at 5 on recheck., with neutrophilic left shift.  LA 2.6, trended to 1.6.  -mIVF 125 ml/hr NS  -Consider stool cultures and GI panel if diarrhea recurs -Monitor fever curve -Monitor CBC with differential -Strict I's and O's -Vitals per routine  Hypertensive Urgency, in setting of Chronic Hypertension: Acute.  BP 223/142 on arrival improved to 184/96 mmHg 12/25. Takes amlodipine 10 mg, carvedilol 6.25 twice daily, HCTZ 25 mg, and losartan 100 mg daily for control. -Monitor BP -Obtain echocardiogram, no echo on file -Start home Coreg 6.25 twice daily and amlodipine 10 mg -Hydralazine 10 mg every 4 PRN SBP >180, DBP >100  -Holding home losartan and HCTZ in the setting of AKI -Monitor BMP  Type 2 Diabetes Mellitus: Chronic, stable.  A1c 6.8 in 10/2016. CBG 156 on admit, down to 82. Chart states that patient takes Janumet and Januvia at home for glucose control, but Janumet was discontinued by his PCP in January 2019. -Obtain A1c -Sensitive SSI -CBGs before meals and nightly -Holding home Janumet and Januvia while inpatient -Clarify patient's medication regimen  Previous complete heart block with pacemaker in place: Stable.  EKG paced rhythm, with new T wave inversion in aVL only.  POC troponin 0.03.  Low concern for ACS as T wave inversion only present in 1-lead and patient without any chest pain, sob.  -Interrogate pacer -Monitor vitals -EKG in the a.m.  Pressure-Associated Skin Breakdown: early signs  of sacral decubitus skin breakdown found on physical exam. No signs of active bleed. I clean and without signs of infection. -Continue to monitor  CAD s/p PCI, Chronic, stable: No patient  had one episode of chest pain on 10/05/2018 but then vomited and symptoms resolved. EKG taken at the time was unchanged from previous.  - Ordered compazine and protonix for N/V/acid reflux. -Continue home rosuvastatin 10 mg daily -Continue home Coreg 6.25 mg twice daily -Consider aspirin 81 mg for secondary prevention  FEN/GI: Carb modified diet, 125 mL/hour NS Prophylaxis: Lovenox for decreased creatinine clearance  Disposition: Admit to [facility], attending Dr. Erin Hearing  Subjective:  Patient seen this morning resting in bed.  He is alert but remains somnolent and confused.  When asked to stand up and walk about the room he is unable to move himself alone to the edge of his bed. Family states he is still confused beyond his baseline.  Objective: Temp:  [97.7 F (36.5 C)-99.7 F (37.6 C)] 98.5 F (36.9 C) (12/25 0712) Pulse Rate:  [75-90] 75 (12/25 0712) Resp:  [16-23] 18 (12/25 0712) BP: (174-230)/(92-142) 196/92 (12/25 0834) SpO2:  [97 %-100 %] 100 % (12/25 0712) Weight:  [81.6 kg] 81.6 kg (12/24 1703)  Physical Exam Cardiovascular:     Rate and Rhythm: Normal rate and regular rhythm.     Pulses: Normal pulses.     Heart sounds: Normal heart sounds.  Pulmonary:     Effort: Pulmonary effort is normal.     Breath sounds: Normal breath sounds.  Abdominal:     General: Bowel sounds are normal.  Musculoskeletal: Normal range of motion.        General: No deformity.     Right lower leg: No edema.     Left lower leg: No edema.  Skin:    General: Skin is dry.  Neurological:     Mental Status: He is alert and oriented to person, place, and time.   Laboratory: Recent Labs  Lab 10/04/18 1700  WBC 16.1*  HGB 14.3  HCT 42.9  PLT 326   Recent Labs  Lab 10/04/18 1700 10/05/18 0625  NA 138 137  K 5.1 4.0  CL 106 109  CO2 16* 16*  BUN 62* 55*  CREATININE 5.26* 4.40*  CALCIUM 8.9 8.4*  PROT 7.3  --   BILITOT 1.3*  --   ALKPHOS 80  --   ALT 15  --   AST 30  --    GLUCOSE 156* 95   Imaging/Diagnostic Tests: Dg Chest 2 View  Result Date: 10/04/2018 CLINICAL DATA:  Confusion, fatigue, rule out occult infection. Pt only c/o diarrhea to xray staff, stating he's had diarrhea for weeks. Hx of HTN, DM.confusion, fatigue, rule out occult infection EXAM: CHEST - 2 VIEW COMPARISON:  10/18/2017 FINDINGS: LEFT-sided pacemaker overlies normal cardiac silhouette. No effusion, infiltrate, pneumothorax. Degenerative osteophytosis of the spine. IMPRESSION: No acute cardiopulmonary process. Electronically Signed   By: Suzy Bouchard M.D.   On: 10/04/2018 18:31   Ct Head Wo Contrast  Result Date: 10/04/2018 CLINICAL DATA:  fall, AMS, hx of brain surgeryAltered level of consciousness (LOC), unexplained fall, AMS, hx of brain surgery EXAM: CT HEAD WITHOUT CONTRAST TECHNIQUE: Contiguous axial images were obtained from the base of the skull through the vertex without intravenous contrast. COMPARISON:  None. FINDINGS: Brain: No acute intracranial hemorrhage. No focal mass lesion. No CT evidence of acute infarction. No midline shift or mass effect. No hydrocephalus. Basilar cisterns are patent. There  are periventricular and subcortical white matter hypodensities. Generalized cortical atrophy. Vascular: No hyperdense vessel or unexpected calcification. Skull: Prior RIGHT craniotomy. Sinuses/Orbits: Orbits normal. Coastal thickening in the ethmoid sinuses Other: None IMPRESSION: 1. No acute intracranial findings. 2. Atrophy and white matter microvascular disease. Electronically Signed   By: Suzy Bouchard M.D.   On: 10/04/2018 19:56   US Renal  Result Date: 10/05/2018 CLINICAL DATA:  Acute onset of renal insufficiency. EXAM: RENAL / URINARY TRACT ULTRASOUND COMPLETE COMPARISON:  None. FINDINGS: Right Kidney: Renal measurements: 10.6 x 6.0 x 6.0 cm = volume: 202.9 mL. Increased parenchymal echogenicity is noted. A 1.5 cm cyst is noted at the interpole region of the right kidney. No  hydronephrosis visualized. Left Kidney: Renal measurements: 9.6 x 4.9 x 5.2 cm = volume: 127.3 mL. Increased parenchymal echogenicity is noted. Cysts are noted at the lower pole and upper pole of the left kidney, measuring 3.2 cm and 1.5 cm in size. The larger cyst has a septation and question of a peripheral soft tissue component. No hydronephrosis visualized. Bladder: Appears normal for degree of bladder distention. IMPRESSION: 1. No evidence of hydronephrosis. 2. Increased renal parenchymal echogenicity raises concern for medical renal disease. 3. Bilateral renal cysts. A somewhat complex 3.2 cm cystic lesion at the lower pole of the left kidney has a septation and question of a peripheral soft tissue component. Contrast-enhanced CT or MRI would be helpful for further evaluation, on an elective nonemergent basis. Electronically Signed   By: Garald Balding M.D.   On: 10/05/2018 00:40   Daisy Floro, DO 10/05/2018, 9:51 AM PGY-1, [place] Intern pager: [phone], text pages welcome

## 2018-10-06 DIAGNOSIS — I25119 Atherosclerotic heart disease of native coronary artery with unspecified angina pectoris: Secondary | ICD-10-CM

## 2018-10-06 DIAGNOSIS — I472 Ventricular tachycardia: Secondary | ICD-10-CM

## 2018-10-06 DIAGNOSIS — R7989 Other specified abnormal findings of blood chemistry: Secondary | ICD-10-CM

## 2018-10-06 DIAGNOSIS — R0789 Other chest pain: Secondary | ICD-10-CM

## 2018-10-06 LAB — BASIC METABOLIC PANEL
Anion gap: 12 (ref 5–15)
BUN: 48 mg/dL — ABNORMAL HIGH (ref 8–23)
CO2: 13 mmol/L — ABNORMAL LOW (ref 22–32)
Calcium: 8.1 mg/dL — ABNORMAL LOW (ref 8.9–10.3)
Chloride: 112 mmol/L — ABNORMAL HIGH (ref 98–111)
Creatinine, Ser: 4.08 mg/dL — ABNORMAL HIGH (ref 0.61–1.24)
GFR calc Af Amer: 15 mL/min — ABNORMAL LOW (ref >60)
GFR calc non Af Amer: 13 mL/min — ABNORMAL LOW (ref >60)
Glucose, Bld: 124 mg/dL — ABNORMAL HIGH (ref 70–99)
Potassium: 3.3 mmol/L — ABNORMAL LOW (ref 3.5–5.1)
Sodium: 137 mmol/L (ref 135–145)

## 2018-10-06 LAB — HEMOGLOBIN A1C
Hgb A1c MFr Bld: 6.5 % — ABNORMAL HIGH (ref 4.8–5.6)
Mean Plasma Glucose: 140 mg/dL

## 2018-10-06 LAB — LIPID PANEL
Cholesterol: 237 mg/dL — ABNORMAL HIGH (ref 0–200)
HDL: 35 mg/dL — ABNORMAL LOW (ref >40)
LDL Cholesterol: 181 mg/dL — ABNORMAL HIGH (ref 0–99)
Total CHOL/HDL Ratio: 6.8 RATIO
Triglycerides: 104 mg/dL (ref <150)
VLDL: 21 mg/dL (ref 0–40)

## 2018-10-06 LAB — CBC WITH DIFFERENTIAL/PLATELET
Abs Immature Granulocytes: 0.09 10*3/uL — ABNORMAL HIGH (ref 0.00–0.07)
Basophils Absolute: 0.1 10*3/uL (ref 0.0–0.1)
Basophils Relative: 1 %
Eosinophils Absolute: 0 10*3/uL (ref 0.0–0.5)
Eosinophils Relative: 0 %
HCT: 35.2 % — ABNORMAL LOW (ref 39.0–52.0)
Hemoglobin: 12.2 g/dL — ABNORMAL LOW (ref 13.0–17.0)
Immature Granulocytes: 1 %
Lymphocytes Relative: 13 %
Lymphs Abs: 1.5 10*3/uL (ref 0.7–4.0)
MCH: 32.7 pg (ref 26.0–34.0)
MCHC: 34.7 g/dL (ref 30.0–36.0)
MCV: 94.4 fL (ref 80.0–100.0)
Monocytes Absolute: 1.1 10*3/uL — ABNORMAL HIGH (ref 0.1–1.0)
Monocytes Relative: 9 %
Neutro Abs: 8.8 10*3/uL — ABNORMAL HIGH (ref 1.7–7.7)
Neutrophils Relative %: 76 %
Platelets: 253 10*3/uL (ref 150–400)
RBC: 3.73 MIL/uL — ABNORMAL LOW (ref 4.22–5.81)
RDW: 12.8 % (ref 11.5–15.5)
WBC: 11.5 10*3/uL — ABNORMAL HIGH (ref 4.0–10.5)
nRBC: 0 % (ref 0.0–0.2)

## 2018-10-06 LAB — TROPONIN I
Troponin I: 0.1 ng/mL (ref <0.03)
Troponin I: 0.05 ng/mL (ref <0.03)
Troponin I: 0.06 ng/mL (ref <0.03)

## 2018-10-06 LAB — GLUCOSE, CAPILLARY
Glucose-Capillary: 76 mg/dL (ref 70–99)
Glucose-Capillary: 105 mg/dL — ABNORMAL HIGH (ref 70–99)
Glucose-Capillary: 86 mg/dL (ref 70–99)
Glucose-Capillary: 87 mg/dL (ref 70–99)
Glucose-Capillary: 164 mg/dL — ABNORMAL HIGH (ref 70–99)

## 2018-10-06 LAB — MAGNESIUM: Magnesium: 2 mg/dL (ref 1.7–2.4)

## 2018-10-06 LAB — URINE CULTURE: Culture: >=10000 — AB

## 2018-10-06 MED ORDER — CALCIUM CARBONATE ANTACID 500 MG PO CHEW
1.0000 | CHEWABLE_TABLET | Freq: Once | ORAL | Status: DC
  Filled 2018-10-06: qty 1

## 2018-10-06 MED ORDER — NITROGLYCERIN 0.4 MG SL SUBL
0.4000 mg | SUBLINGUAL_TABLET | SUBLINGUAL | Status: AC | PRN
  Administered 2018-10-06 (×3): 0.4 mg via SUBLINGUAL
  Filled 2018-10-06: qty 1

## 2018-10-06 MED ORDER — MORPHINE BOLUS VIA INFUSION: 2.0000 mg | INTRAVENOUS | Status: DC | PRN

## 2018-10-06 MED ORDER — SUCRALFATE 1 GM/10ML PO SUSP
1.0000 g | Freq: Once | ORAL | Status: AC
  Administered 2018-10-06: 1 g via ORAL
  Filled 2018-10-06: qty 10

## 2018-10-06 MED ORDER — POTASSIUM CHLORIDE CRYS ER 20 MEQ PO TBCR
40.0000 meq | EXTENDED_RELEASE_TABLET | Freq: Two times a day (BID) | ORAL | Status: AC
  Administered 2018-10-06 (×2): 40 meq via ORAL
  Filled 2018-10-06 (×2): qty 2

## 2018-10-06 MED ORDER — POTASSIUM CHLORIDE CRYS ER 20 MEQ PO TBCR: 20.0000 meq | EXTENDED_RELEASE_TABLET | Freq: Two times a day (BID) | ORAL | Status: DC

## 2018-10-06 MED ORDER — METOPROLOL TARTRATE 50 MG PO TABS: 50.0000 mg | ORAL_TABLET | Freq: Two times a day (BID) | ORAL | Status: DC

## 2018-10-06 MED ORDER — ASPIRIN EC 81 MG PO TBEC
81.0000 mg | DELAYED_RELEASE_TABLET | Freq: Every day | ORAL | Status: DC
  Administered 2018-10-06 – 2018-10-21 (×16): 81 mg via ORAL
  Filled 2018-10-06 (×17): qty 1

## 2018-10-06 MED ORDER — LABETALOL HCL 5 MG/ML IV SOLN: 5.0000 mg | INTRAVENOUS | Status: DC | PRN

## 2018-10-06 MED ORDER — METOPROLOL TARTRATE 100 MG PO TABS
100.0000 mg | ORAL_TABLET | Freq: Two times a day (BID) | ORAL | Status: DC
  Administered 2018-10-06 (×2): 100 mg via ORAL
  Filled 2018-10-06 (×2): qty 1

## 2018-10-06 MED ORDER — ALUM & MAG HYDROXIDE-SIMETH 200-200-20 MG/5ML PO SUSP
30.0000 mL | Freq: Once | ORAL | Status: AC
  Administered 2018-10-06: 30 mL via ORAL
  Filled 2018-10-06: qty 30

## 2018-10-06 MED ORDER — ENSURE ENLIVE PO LIQD
237.0000 mL | Freq: Two times a day (BID) | ORAL | Status: DC
  Administered 2018-10-06 – 2018-10-21 (×26): 237 mL via ORAL

## 2018-10-06 MED ORDER — MORPHINE SULFATE (PF) 2 MG/ML IV SOLN
2.0000 mg | INTRAVENOUS | Status: DC | PRN
  Filled 2018-10-06: qty 1

## 2018-10-06 MED ORDER — SUCRALFATE 1 GM/10ML PO SUSP: 1.0000 g | Freq: Three times a day (TID) | ORAL | Status: DC

## 2018-10-06 MED ORDER — NITROGLYCERIN 2 % TD OINT
1.0000 [in_us] | TOPICAL_OINTMENT | Freq: Four times a day (QID) | TRANSDERMAL | Status: AC
  Administered 2018-10-06: 1 [in_us] via TOPICAL
  Filled 2018-10-06: qty 30

## 2018-10-06 MED ORDER — POTASSIUM CHLORIDE CRYS ER 20 MEQ PO TBCR: 40.0000 meq | EXTENDED_RELEASE_TABLET | Freq: Two times a day (BID) | ORAL | Status: DC

## 2018-10-06 NOTE — Progress Notes (Signed)
CRITICAL VALUE ALERT  Critical Value:  Troponin 0.10  Date & Time Notied:  10/06/18 03:21  Provider Notified: Text paged Anderson,MD 10/06/18 @ 03:29  Orders Received/Actions taken: None,Cardiologist came to see patient.

## 2018-10-06 NOTE — Progress Notes (Signed)
Patient still c/o chest pain with no change after two SL nitroglycerin. BP 149/79 HR 94 O2 sat 100% on room air RR 17. MD on call notified. Montravious Weigelt, Wonda Cheng, Therapist, sports

## 2018-10-06 NOTE — Progress Notes (Signed)
Patient asked this RN " Who put the DNR on my arm,isn't that Do not rescuscitate? This RN told patient yes and asked patient if that wasn't his wish, patient replied,No. MD on call made aware of patient's wish to rescind DNR status.

## 2018-10-06 NOTE — Evaluation (Signed)
Physical Therapy Evaluation Patient Details Name: William [name] MRN: [id number] DOB: July 30, 1941 Today's Date: 10/06/2018   History of Present Illness  William [name] is a 77 y.o. male presenting after a fall with headache, found to have altered mental status, AKI, and hypertensive urgency. 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  Clinical Impression  Patient presents with decreased balance, impaired cognition, impaired problem solving, awareness and decreased mobility s/p above. Tolerated transfers and gait training with Min A for balance/safety as well as Max verbal/tactile cues for RW management and navigation. Pt running into everything on left side. Demonstrates shuffling like gait similar to Parkinsonism gait. Also noted to have decreased initiation and awareness of deficits. Pt independent PTA, driving and performing ADLs per pt and daughter. Did not report falls but daughter did. Not safe to be home alone. Recommend cognitive evaluation from SLP. Would benefit from SNF to maximize independence and mobility prior to return home. Will follow acutely.    Follow Up Recommendations SNF;Supervision for mobility/OOB    Equipment Recommendations  Other (comment)(TBA)    Recommendations for Other Services Speech consult     Precautions / Restrictions Precautions Precautions: Fall Restrictions Weight Bearing Restrictions: No      Mobility  Bed Mobility Overal bed mobility: Needs Assistance Bed Mobility: Rolling;Sidelying to Sit Rolling: Min guard Sidelying to sit: HOB elevated;Min assist       General bed mobility comments: Step by step cues for technique, increased time/effort. difficulty with initiation.  Transfers Overall transfer level: Needs assistance Equipment used: Rolling walker (2 wheeled) Transfers: Sit to/from Stand Sit to Stand: Min assist         General transfer  comment: Min A to power up to standing with cues for hand placement as pt trying to pull up RW, transferred to chair post ambulation.  Ambulation/Gait Ambulation/Gait assistance: Min assist Gait Distance (Feet): 75 Feet Assistive device: Rolling walker (2 wheeled) Gait Pattern/deviations: Shuffle;Trunk flexed;Step-to pattern;Step-through pattern;Decreased step length - left;Decreased step length - right;Staggering left Gait velocity: decreased   General Gait Details: Forward flexed trunk with RW too far anterior; running into everything on left side needing max verbal/tactile cues to navigate environment and for RW proximity. Short shuffling like gait.   Stairs            Wheelchair Mobility    Modified Rankin (Stroke Patients Only)       Balance Overall balance assessment: Needs assistance;History of Falls Sitting-balance support: Feet supported;No upper extremity supported Sitting balance-Leahy Scale: Fair     Standing balance support: During functional activity;Bilateral upper extremity supported Standing balance-Leahy Scale: Poor Standing balance comment: Reliant on BUes for support in standing.                              Pertinent Vitals/Pain Pain Assessment: No/denies pain    Home Living Family/patient expects to be discharged to:: Private residence Living Arrangements: Alone Available Help at Discharge: Family;Available PRN/intermittently Type of Home: House Home Access: Stairs to enter   Entrance Stairs-Number of Steps: 2 vs 5 Home Layout: Laundry or work area in basement;Two level Home Equipment: Shower seat      Prior Function Level of Independence: Independent         Comments: Drives. Cooks, cleans. Daugter reports some falls.     Hand Dominance        Extremity/Trunk  Assessment   Upper Extremity Assessment Upper Extremity Assessment: Defer to OT evaluation    Lower Extremity Assessment Lower Extremity Assessment:  Overall WFL for tasks assessed    Cervical / Trunk Assessment Cervical / Trunk Assessment: Kyphotic  Communication   Communication: No difficulties  Cognition Arousal/Alertness: Awake/alert Behavior During Therapy: Flat affect;WFL for tasks assessed/performed Overall Cognitive Status: Impaired/Different from baseline Area of Impairment: Memory;Following commands;Problem solving;Safety/judgement;Orientation                     Memory: Decreased short-term memory Following Commands: Follows one step commands with increased time;Follows multi-step commands inconsistently Safety/Judgement: Decreased awareness of deficits   Problem Solving: Slow processing;Difficulty sequencing;Requires verbal cues;Requires tactile cues General Comments: Not sure how oriented pt is as he is using cues in room to answer questions- "William [name]" (reading board), reading date on wall etc. Knows vaguely why he is here. Requires repetition to perform tasks. Reports walking seems back to normal however pt using RW at this time and previously independent. Running into things on left side- not aware.       General Comments General comments (skin integrity, edema, etc.): Daughter present for part of session. Reports her dad is close to baseline??    Exercises     Assessment/Plan    PT Assessment Patient needs continued PT services  PT Problem List Decreased mobility;Decreased safety awareness;Decreased cognition;Decreased balance;Decreased knowledge of use of DME;Cardiopulmonary status limiting activity       PT Treatment Interventions Functional mobility training;Balance training;Patient/family education;Therapeutic activities;Stair training;Therapeutic exercise;Gait training;Neuromuscular re-education;Cognitive remediation;DME instruction    PT Goals (Current goals can be found in the Care Plan section)  Acute Rehab PT Goals Patient Stated Goal: to go home PT Goal Formulation: With patient Time  For Goal Achievement: 10/20/18 Potential to Achieve Goals: Good    Frequency Min 3X/week   Barriers to discharge Decreased caregiver support;Inaccessible home environment lives alone; stairs to enter    Co-evaluation               AM-PAC PT "6 Clicks" Mobility  Outcome Measure Help needed turning from your back to your side while in a flat bed without using bedrails?: A Little Help needed moving from lying on your back to sitting on the side of a flat bed without using bedrails?: A Little Help needed moving to and from a bed to a chair (including a wheelchair)?: A Little Help needed standing up from a chair using your arms (e.g., wheelchair or bedside chair)?: A Little Help needed to walk in hospital room?: A Little Help needed climbing 3-5 steps with a railing? : A Lot 6 Click Score: 17    End of Session Equipment Utilized During Treatment: Gait belt Activity Tolerance: Patient tolerated treatment well Patient left: in chair;with call bell/phone within reach;with chair alarm set;with family/visitor present Nurse Communication: Mobility status PT Visit Diagnosis: Unsteadiness on feet (R26.81);Difficulty in walking, not elsewhere classified (R26.2)    Time: 4034-7425 PT Time Calculation (min) (ACUTE ONLY): 31 min   Charges:   PT Evaluation $PT Eval Low Complexity: 1 Low PT Treatments $Gait Training: 8-22 mins        Wray Kearns, PT, DPT Acute Rehabilitation Services Pager [phone] Office [place] 10/06/2018, 10:42 AM

## 2018-10-06 NOTE — Progress Notes (Signed)
Patient refused tums,states "it doesn't work". Asked if he had taken it before,pt states "years ago". Lab in to draw blood work. Will continue to monitor.

## 2018-10-06 NOTE — Evaluation (Signed)
Occupational Therapy Evaluation Patient Details Name: William [name] MRN: [id number] DOB: 1941/08/18 Today's Date: 10/06/2018    History of Present Illness ROBBI SCURLOCK is a 77 y.o. male presenting after a fall with headache, found to have altered mental status, AKI, and hypertensive urgency. 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   Clinical Impression   PTA Pt independent in ADL and mobility (although PT note references falls per daughter). Today the Pt was overall min A with mod cues for safety and use of RW. Pt was also given then Mini-Mental State Examination (MMSE) and scored a 21/30 indicating mild cognitive deficits and increased risk for dementia. (please see cognitive section for further details). Pt also with noted left inattention throughout functional part of session during sink level grooming activities. Pt will benefit from skilled OT in the acute setting as well as afterwards at the SNF level to maximize safety and independence in ADL and functional transfers.     Follow Up Recommendations  SNF;Supervision/Assistance - 24 hour    Equipment Recommendations  Other (comment)(defer to next venue of care)    Recommendations for Other Services Speech consult(for further cognitive assessment)     Precautions / Restrictions Precautions Precautions: Fall Restrictions Weight Bearing Restrictions: No      Mobility Bed Mobility Overal bed mobility: Needs Assistance Bed Mobility: Rolling;Sidelying to Sit Rolling: Min guard Sidelying to sit: HOB elevated;Min assist       General bed mobility comments: Pt OOB in recliner at beginning and end of session  Transfers Overall transfer level: Needs assistance Equipment used: Rolling walker (2 wheeled) Transfers: Sit to/from Stand Sit to Stand: Min assist         General transfer comment: Min A to power up to standing, vc for safe  hand placement with up and down    Balance Overall balance assessment: Needs assistance;History of Falls Sitting-balance support: Feet supported;No upper extremity supported Sitting balance-Leahy Scale: Fair     Standing balance support: During functional activity;Bilateral upper extremity supported Standing balance-Leahy Scale: Poor Standing balance comment: Reliant on BUes for support in standing.                            ADL either performed or assessed with clinical judgement   ADL Overall ADL's : Needs assistance/impaired Eating/Feeding: Set up;Sitting   Grooming: Minimal assistance;Standing;Wash/dry hands;Wash/dry face Grooming Details (indicate cue type and reason): cues for sequencing, assist for balance (static standing at sink) Upper Body Bathing: Minimal assistance;Sitting   Lower Body Bathing: Minimal assistance;Sitting/lateral leans   Upper Body Dressing : Set up;Sitting   Lower Body Dressing: Minimal assistance;Sit to/from stand   Toilet Transfer: Minimal assistance;Ambulation;RW;Grab bars Toilet Transfer Details (indicate cue type and reason): vc for safety and management of RW Toileting- Clothing Manipulation and Hygiene: Minimal assistance;Sit to/from stand       Functional mobility during ADLs: Minimal assistance;Cueing for safety;Cueing for sequencing;Rolling walker General ADL Comments: Pt requires cues for problem solving, safety with RW (dependent due to balance deficits)     Vision Baseline Vision/History: Wears glasses Wears Glasses: At all times Vision Assessment?: Vision impaired- to be further tested in functional context     Perception Perception Perception Tested?: Yes Perception Deficits: Inattention/neglect Inattention/Neglect: Impaired- to be further tested in functional context Comments: Pt will attend to left side with cues - not formally assessed, just with  function he required cues to find objects on left   Praxis       Pertinent Vitals/Pain Pain Assessment: Faces Pain Score: 10-Worst pain ever Faces Pain Scale: Hurts whole lot Pain Location: chest - radiating and sharp Pain Descriptors / Indicators: Grimacing;Radiating;Stabbing;Sharp Pain Intervention(s): Monitored during session;Other (comment)(RN notified)     Hand Dominance Right   Extremity/Trunk Assessment Upper Extremity Assessment Upper Extremity Assessment: Overall WFL for tasks assessed   Lower Extremity Assessment Lower Extremity Assessment: Defer to PT evaluation   Cervical / Trunk Assessment Cervical / Trunk Assessment: Kyphotic   Communication Communication Communication: No difficulties(soft spoken)   Cognition Arousal/Alertness: Awake/alert Behavior During Therapy: Flat affect;WFL for tasks assessed/performed Overall Cognitive Status: Impaired/Different from baseline Area of Impairment: Memory;Following commands;Problem solving;Safety/judgement;Orientation                 Orientation Level: Situation;Disoriented to   Memory: Decreased short-term memory Following Commands: Follows one step commands with increased time;Follows multi-step commands inconsistently Safety/Judgement: Decreased awareness of deficits   Problem Solving: Slow processing;Difficulty sequencing;Requires verbal cues;Requires tactile cues General Comments: Pt was given the MMSE and scored a 21/30 idicating increased odds of dementia and a mild cognitive impairment. Pt's biggest area of deficits were short term memory, problem solving, and visuospatial. Pt was mildly disoriented and used context clues in the room to answer some questions.   It should also be noted that after the assessment when having a conversation with Pt - he started talking about a crooked lawyer who was trying to steal his house from him (under foreclosure) and without family present I did not know if this is a legitimate concern, or if it was something he was fabricating.     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 Cooks, cleans. Daugter reports some falls.        OT Problem List: Impaired balance (sitting and/or standing);Impaired vision/perception;Decreased cognition;Decreased safety awareness;Decreased knowledge of use of DME or AE;Pain      OT Treatment/Interventions: Self-care/ADL training;DME and/or AE instruction;Therapeutic activities;Cognitive remediation/compensation;Visual/perceptual remediation/compensation;Patient/family education;Balance training    OT Goals(Current goals can be found in the care plan section) Acute Rehab OT Goals Patient Stated Goal: to go home OT Goal Formulation: With patient Time For Goal Achievement: 10/20/18 Potential to Achieve Goals: Good ADL Goals Pt Will Perform Grooming: with supervision;standing Pt Will Transfer to Toilet: with supervision;ambulating Pt Will Perform Toileting - Clothing Manipulation and hygiene: with modified independence;sit to/from stand Additional ADL Goal #1: Pt will perform bed mobility at supervision level prior to engaging in ADL activity  OT Frequency: Min 2X/week   Barriers to D/C:  Decreased caregiver support  Pt typically lives alone       Co-evaluation  AM-PAC OT "6 Clicks" Daily Activity     Outcome Measure Help from another person eating meals?: A Little Help from another person taking care of personal grooming?: A Little Help from another person toileting, which includes using toliet, bedpan, or urinal?: A Little Help from another person bathing (including washing, rinsing, drying)?: A Little Help from another person to put on and taking off regular upper body clothing?: A Little Help from another person to put on and taking off regular lower body clothing?: A Little 6 Click Score: 18   End of Session Equipment Utilized During Treatment: Gait belt;Rolling walker Nurse Communication: Mobility status;Precautions;Other (comment)(chest pain, seemed rhythmic)  Activity Tolerance: Patient tolerated treatment well Patient left: in chair;with call bell/phone within reach;with chair alarm set  OT Visit Diagnosis: Unsteadiness on feet (R26.81);Other abnormalities of gait and mobility (R26.89);Repeated falls (R29.6);History of falling (Z91.81);Other symptoms and signs involving the nervous system (R29.898);Other symptoms and signs involving cognitive function                Time: 4142-3953 OT Time Calculation (min): 33 min Charges:  OT General Charges $OT Visit: 1 Visit OT Evaluation $OT Eval Moderate Complexity: 1 Mod OT Treatments $Self Care/Home Management : 8-22 mins  Hulda Humphrey OTR/L Acute Rehabilitation Services Pager: [phone] Office: [place] 10/06/2018, 1:36 PM

## 2018-10-06 NOTE — Progress Notes (Signed)
Patient with complaints of indigestion, which caused hiccups. Page to Dr. Higinio Plan for notification. Dorthey Sawyer, RN

## 2018-10-06 NOTE — Progress Notes (Signed)
Family Medicine Teaching Service Daily Progress Note Intern Pager: [phone]  Patient name: CHERRY WITTWER Medical record number: [id number] Date of birth: 10-19-1940 Age: 77 y.o. Gender: male  Primary Care Provider: Patient, No Pcp Per Consultants: None Code Status: DNR  Pt Overview and Major Events to Date:  10/04/2018-patient admitted for headache, fall  Assessment and Plan: Arrow Tomko is a 77 year old male presenting after a fall with a headache, found to have altered mental status, AKI, and hypertensive urgency.  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  ?AMS likely in setting of underlying dementia: Improved.  Pt has been alert and oriented X3 for duration of admission, but continues to be more confused about his past/life events and forgetting objects etc.  Patient not entirely clear on why he is at the hospital, question his capacity for medical decision-making. Family states they have noticed a steady decline over the past year.  Believe of his presentation now is more likely in the setting of progressing dementia, however may have had a slight change in mental status on admission due to dehydration and elevated blood pressures.  AMS work-up thus far has been negative, however will obtain a B12 and folate. - Set up family meeting, have discussion on goals of care - Follow-up urine culture - Obtain B12/folate - Monitor BMP, CBC -Consider [place]  AKI on CKD stage 3B:  Improving. CR 4.08, from 4.4 yesterday.  5.26 on admit, apparent baseline appears to be around 1.6-1.7, however his new baseline is likely higher than this as last creatinine was from 10/2017. No signs of obstruction via renal U/S, suggesting medical renal disease.  In addition, FeNa 1.6 present also suggesting intrinsic etiology.  Likely multifactorial, in the setting of dehydration, chronic uncontrolled  hypertension, T2 DM. - Monitor BMP -d/c mIVF, encourage p.o. intake - Avoid nephrotoxic medications as possible, holding home losartan and HCTZ - Follow-up urine culture - Strict I&O - Continue to consider nephrology consult, however continue to trend down at this point  Chest pain: Acute. Pt reported chest pain for the first time yesterday, however states is been present for 3 days. Not relieved with nitro X3, but somewhat relieved with GI cocktail.  EKG similar to previous, no evidence of STEMI, however with paced rhythm and LBBB difficult to further assess signs of ischemia.  Troponin elevated at 0.1, from POC troponin 0.03 on 12/24.  May be demand secondary to hypertension, however will continue to trend troponins. -Cardiology on board, recommend trending troponins and additional recommendations as below - Lipid panel pending - Maintain K>4, Mag>2  - Transition Coreg to Lopressor 100 mg twice daily -Start aspirin 81 mg, continue rosuvastatin 10 mg   Hypokalemia: Acute. K 3.3, from 4 yesterday.  May be in the setting of diarrhea. -Monitor BMP -Replete with K-Dur 40 X2  Diarrhea, Acute: Improved. First episode of watery bowel movements since admission this morning.  No further documented episodes.  Leukocytosis 11.5 from 10.4.  -Encourage p.o. intake -Consider stool cultures and GI panel if diarrhea worsens -Monitor fever curve, CBC -Strict I's and O's  Hypertensive Urgency, in setting of Chronic Hypertension: Acute.  Last BP 179/94.  SBP ranging 140-190 overnight.  Echo 12/25 EF 65-70%, G1 DD, and pattern of severe LVH.  Will monitor with switch of beta-blocker today. -Transitioning to Lopressor 100 mg twice daily, DC Coreg -Continue home amlodipine 10 mg -Hydralazine 10 mg every 4  PRN SBP >180, DBP >100  -Holding home losartan 100mg  and HCTZ 25mg  in the setting of AKI -Monitor BP  Type 2 Diabetes Mellitus: Chronic, stable.  A1c 6.5 on 12/24.  CBG average low 100s.  Has not  required any SSI. -Sensitive SSI -CBGs before meals and nightly -Holding home Janumet and Januvia while inpatient (however per PCP docs, Janumet was D/C in 10/2017) -Clarify patient's medication regimen  Previous complete heart block with pacemaker in place: Stable.  See chest pain assessment above.  EKG paced rhythm.  -Cardiology on board, plan to interrogate pacer -Monitor vitals  Sacral decubitus ulcer, stage I: Stable.  Early signs of sacral decubitus skin breakdown found on physical exam on 12/25.  Noted to be clean without signs of infection. -Continue to monitor   CAD s/p PCI:  Chronic, stable See chest pain assessment as above. -Continue home rosuvastatin 10 mg daily -Starting Lopressor 100 mg twice daily per cardiology, DC home Coreg -Start aspirin 81 mg for secondary prevention  FEN/GI: Carb modified diet, DC fluids Prophylaxis: Lovenox for decreased creatinine clearance  Disposition:  Continue evaluation of chest pain and AKI, but ultimate decision moving forward will be dependent on goals of care discussion with family given likely underlying dementia.  Subjective:  Had substernal chest pain overnight, stated that it actually been present for 3 days.  Slightly improved with GI cocktail, no improvement with nitro.  Cardiology was consulted.   This morning, patient is doing well without any chest pain.  He states he had one episode of watery bowel movement this morning, but otherwise had not had any since admission.  Objective: Temp:  [98.5 F (36.9 C)-99.3 F (37.4 C)] 98.5 F (36.9 C) (12/26 0504) Pulse Rate:  [87-95] 91 (12/26 0504) Resp:  [17-20] 20 (12/26 0504) BP: (149-196)/(79-108) 179/94 (12/26 0504) SpO2:  [98 %-100 %] 98 % (12/26 0504)  Physical exam:  General: Alert, NAD HEENT: NCAT, MMM, oropharynx nonerythematous  Cardiac: RRR no m/g/r Lungs: Clear bilaterally, no increased WOB  Abdomen: soft, non-tender, non-distended, normoactive BS Msk: Moves  all extremities spontaneously  Ext: Warm, dry, 2+ distal pulses, no edema   Laboratory: Recent Labs  Lab 10/04/18 1700 10/05/18 0943 10/05/18 1505  WBC 16.1* 5.0 10.4  HGB 14.3 9.0* 12.6*  HCT 42.9 27.5* 37.9*  PLT 326 246 287   Recent Labs  Lab 10/04/18 1700 10/05/18 0625 10/06/18 0219  NA 138 137 137  K 5.1 4.0 3.3*  CL 106 109 112*  CO2 16* 16* 13*  BUN 62* 55* 48*  CREATININE 5.26* 4.40* 4.08*  CALCIUM 8.9 8.4* 8.1*  PROT 7.3  --   --   BILITOT 1.3*  --   --   ALKPHOS 80  --   --   ALT 15  --   --   AST 30  --   --   GLUCOSE 156* 95 124*   Imaging/Diagnostic Tests: Dg Chest 2 View  Result Date: 10/04/2018 CLINICAL DATA:  Confusion, fatigue, rule out occult infection. Pt only c/o diarrhea to xray staff, stating he's had diarrhea for weeks. Hx of HTN, DM.confusion, fatigue, rule out occult infection EXAM: CHEST - 2 VIEW COMPARISON:  10/18/2017 FINDINGS: LEFT-sided pacemaker overlies normal cardiac silhouette. No effusion, infiltrate, pneumothorax. Degenerative osteophytosis of the spine. IMPRESSION: No acute cardiopulmonary process. Electronically Signed   By: Suzy Bouchard M.D.   On: 10/04/2018 18:31   Ct Head Wo Contrast  Result Date: 10/04/2018 CLINICAL DATA:  fall, AMS, hx of  brain surgeryAltered level of consciousness (LOC), unexplained fall, AMS, hx of brain surgery EXAM: CT HEAD WITHOUT CONTRAST TECHNIQUE: Contiguous axial images were obtained from the base of the skull through the vertex without intravenous contrast. COMPARISON:  None. FINDINGS: Brain: No acute intracranial hemorrhage. No focal mass lesion. No CT evidence of acute infarction. No midline shift or mass effect. No hydrocephalus. Basilar cisterns are patent. There are periventricular and subcortical white matter hypodensities. Generalized cortical atrophy. Vascular: No hyperdense vessel or unexpected calcification. Skull: Prior RIGHT craniotomy. Sinuses/Orbits: Orbits normal. Coastal thickening in  the ethmoid sinuses Other: None IMPRESSION: 1. No acute intracranial findings. 2. Atrophy and white matter microvascular disease. Electronically Signed   By: Suzy Bouchard M.D.   On: 10/04/2018 19:56   US Renal  Result Date: 10/05/2018 CLINICAL DATA:  Acute onset of renal insufficiency. EXAM: RENAL / URINARY TRACT ULTRASOUND COMPLETE COMPARISON:  None. FINDINGS: Right Kidney: Renal measurements: 10.6 x 6.0 x 6.0 cm = volume: 202.9 mL. Increased parenchymal echogenicity is noted. A 1.5 cm cyst is noted at the interpole region of the right kidney. No hydronephrosis visualized. Left Kidney: Renal measurements: 9.6 x 4.9 x 5.2 cm = volume: 127.3 mL. Increased parenchymal echogenicity is noted. Cysts are noted at the lower pole and upper pole of the left kidney, measuring 3.2 cm and 1.5 cm in size. The larger cyst has a septation and question of a peripheral soft tissue component. No hydronephrosis visualized. Bladder: Appears normal for degree of bladder distention. IMPRESSION: 1. No evidence of hydronephrosis. 2. Increased renal parenchymal echogenicity raises concern for medical renal disease. 3. Bilateral renal cysts. A somewhat complex 3.2 cm cystic lesion at the lower pole of the left kidney has a septation and question of a peripheral soft tissue component. Contrast-enhanced CT or MRI would be helpful for further evaluation, on an elective nonemergent basis. Electronically Signed   By: Garald Balding M.D.   On: 10/05/2018 00:40   Patriciaann Clan, DO 10/06/2018, 8:03 AM PGY-1, [facility] Intern pager: [phone], text pages welcome

## 2018-10-06 NOTE — Progress Notes (Signed)
Progress Note  Patient Name: William [name] Date of Encounter: 10/06/2018  Primary Cardiologist: New [facility]   Subjective   77 year old gentleman with a history of coronary artery disease-status post PCI, hyperlipidemia, history of heart block with pacemaker implantation. He presented to the hospital yesterday with mental status changes and having fallen. Troponin levels were minimally elevated.  He has acute renal failure. Echocardiogram from October 05, 2018 reveals vigorous left ventricular systolic function with an ejection fraction of 65 to 70%.  He has grade 1 diastolic dysfunction.  He has mild aortic stenosis.  He presented with some atypical chest pain.  Chest pain was not relieved with sublingual nitroglycerin.  It was more likely due to gastroesophageal reflux.   Inpatient Medications    Scheduled Meds: . amLODipine  10 mg Oral Daily  . aspirin EC  81 mg Oral Daily  . calcium carbonate  1 tablet Oral Once  . enoxaparin (LOVENOX) injection  30 mg Subcutaneous Q24H  . insulin aspart  0-9 Units Subcutaneous TID WC  . metoprolol tartrate  100 mg Oral BID  . pantoprazole  20 mg Oral Daily  . potassium chloride  40 mEq Oral BID  . rosuvastatin  10 mg Oral QHS   Continuous Infusions: . sodium chloride 100 mL/hr at 10/06/18 0901   PRN Meds: acetaminophen **OR** acetaminophen, hydrALAZINE, labetalol, morphine injection, prochlorperazine   Vital Signs    Vitals:   10/06/18 0136 10/06/18 0234 10/06/18 0504 10/06/18 0858  BP: (!) 149/79 (!) 159/86 (!) 179/94 (!) 194/94  Pulse: 94 89 91 84  Resp: 17 18 20 19   Temp:  98.9 F (37.2 C) 98.5 F (36.9 C) 98.7 F (37.1 C)  TempSrc:  Oral Oral Oral  SpO2: 99% 100% 98% 100%  Weight:      Height:        Intake/Output Summary (Last 24 hours) at 10/06/2018 0916 Last data filed at 10/06/2018 0900 Gross per 24 hour  Intake 660 ml  Output 2750 ml  Net -2090 ml   Filed Weights   10/04/18 1703  Weight: 81.6 kg     Telemetry  V pacing ,  Had a 30+ beat run of NSVT  - Personally Reviewed  ECG      - Personally Reviewed  Physical Exam    GEN: No acute distress.   Neck: No JVD Cardiac: RRR, no murmurs, rubs, or gallops.  Respiratory: Clear to auscultation bilaterally. GI: Soft, nontender, non-distended  MS: No edema; No deformity. Neuro:  Nonfocal  Psych: Normal affect   Labs    Chemistry Recent Labs  Lab 10/04/18 1700 10/05/18 0625 10/06/18 0219  NA 138 137 137  K 5.1 4.0 3.3*  CL 106 109 112*  CO2 16* 16* 13*  GLUCOSE 156* 95 124*  BUN 62* 55* 48*  CREATININE 5.26* 4.40* 4.08*  CALCIUM 8.9 8.4* 8.1*  PROT 7.3  --   --   ALBUMIN 3.6  --   --   AST 30  --   --   ALT 15  --   --   ALKPHOS 80  --   --   BILITOT 1.3*  --   --   GFRNONAA 10* 12* 13*  GFRAA 11* 14* 15*  ANIONGAP 16* 12 12     Hematology Recent Labs  Lab 10/04/18 1700 10/05/18 0943 10/05/18 1505  WBC 16.1* 5.0 10.4  RBC 4.43 2.97* 4.02*  HGB 14.3 9.0* 12.6*  HCT 42.9 27.5* 37.9*  MCV  96.8 92.6 94.3  MCH 32.3 30.3 31.3  MCHC 33.3 32.7 33.2  RDW 12.7 15.3 12.7  PLT 326 246 287    Cardiac Enzymes Recent Labs  Lab 10/06/18 0219  TROPONINI 0.10*    Recent Labs  Lab 10/04/18 1725  TROPIPOC 0.03     BNPNo results for input(s): BNP, PROBNP in the last 168 hours.   DDimer No results for input(s): DDIMER in the last 168 hours.   Radiology    Dg Chest 2 View  Result Date: 10/04/2018 CLINICAL DATA:  Confusion, fatigue, rule out occult infection. Pt only c/o diarrhea to xray staff, stating he's had diarrhea for weeks. Hx of HTN, DM.confusion, fatigue, rule out occult infection EXAM: CHEST - 2 VIEW COMPARISON:  10/18/2017 FINDINGS: LEFT-sided pacemaker overlies normal cardiac silhouette. No effusion, infiltrate, pneumothorax. Degenerative osteophytosis of the spine. IMPRESSION: No acute cardiopulmonary process. Electronically Signed   By: Suzy Bouchard M.D.   On: 10/04/2018 18:31   Ct Head  Wo Contrast  Result Date: 10/04/2018 CLINICAL DATA:  fall, AMS, hx of brain surgeryAltered level of consciousness (LOC), unexplained fall, AMS, hx of brain surgery EXAM: CT HEAD WITHOUT CONTRAST TECHNIQUE: Contiguous axial images were obtained from the base of the skull through the vertex without intravenous contrast. COMPARISON:  None. FINDINGS: Brain: No acute intracranial hemorrhage. No focal mass lesion. No CT evidence of acute infarction. No midline shift or mass effect. No hydrocephalus. Basilar cisterns are patent. There are periventricular and subcortical white matter hypodensities. Generalized cortical atrophy. Vascular: No hyperdense vessel or unexpected calcification. Skull: Prior RIGHT craniotomy. Sinuses/Orbits: Orbits normal. Coastal thickening in the ethmoid sinuses Other: None IMPRESSION: 1. No acute intracranial findings. 2. Atrophy and white matter microvascular disease. Electronically Signed   By: Suzy Bouchard M.D.   On: 10/04/2018 19:56   US Renal  Result Date: 10/05/2018 CLINICAL DATA:  Acute onset of renal insufficiency. EXAM: RENAL / URINARY TRACT ULTRASOUND COMPLETE COMPARISON:  None. FINDINGS: Right Kidney: Renal measurements: 10.6 x 6.0 x 6.0 cm = volume: 202.9 mL. Increased parenchymal echogenicity is noted. A 1.5 cm cyst is noted at the interpole region of the right kidney. No hydronephrosis visualized. Left Kidney: Renal measurements: 9.6 x 4.9 x 5.2 cm = volume: 127.3 mL. Increased parenchymal echogenicity is noted. Cysts are noted at the lower pole and upper pole of the left kidney, measuring 3.2 cm and 1.5 cm in size. The larger cyst has a septation and question of a peripheral soft tissue component. No hydronephrosis visualized. Bladder: Appears normal for degree of bladder distention. IMPRESSION: 1. No evidence of hydronephrosis. 2. Increased renal parenchymal echogenicity raises concern for medical renal disease. 3. Bilateral renal cysts. A somewhat complex 3.2 cm  cystic lesion at the lower pole of the left kidney has a septation and question of a peripheral soft tissue component. Contrast-enhanced CT or MRI would be helpful for further evaluation, on an elective nonemergent basis. Electronically Signed   By: Garald Balding M.D.   On: 10/05/2018 00:40    Cardiac Studies      Patient Profile     77 y.o. male with a history of coronary artery disease.  He presents with acute renal failure, mental status changes and weakness.  He was found to have a minimally elevated troponin level.  Assessment & Plan    1.  Elevated troponin level: His elevated troponin level is most likely related to his acute renal insufficiency. There is no evidence of any acute coronary syndrome.  The pain was not relieved with nitroglycerin. He has normal left ventricular systolic function. At this time I do not think any further work-up is indicated.  We can perform outpatient Myoview studies in the office if he has additional episodes of chest pain.  2.  Acute renal failure: He is being seen by the family medicine team He will probably also need to be seen by nephrology consultation.  2.  Nonsustained ventricular tachycardia: He had a 30+ beat run of nonsustained VT.   In the setting of hypokalemia with a potassium of 3.3. Place potassium and keep the potassium level above 4.  Level is 2.0 which is good.   For questions or updates, please contact [facility] Please consult [email or web address] for contact info under        Signed, Mertie Moores, MD  10/06/2018, 9:16 AM
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
## 2018-10-06 NOTE — Progress Notes (Signed)
Patient c/o chest pain,described as aching 9/10. VS 194/108 HR 89 T 98.9 100% on room air. MD on call notified. Order received. Will continue to monitor. Kate Larock, Wonda Cheng, Therapist, sports

## 2018-10-06 NOTE — Progress Notes (Signed)
FPTS Interim Progress Note  S:paged overnight for patient complaint of chest pain and hypertension 194/108. Patient was given 10mg  PRN hydral which improved BP.  Patient stated the pain was constant and stabbing for 3 days with no radiation, no diaphoresis, no N/V, no SOB.  O: BP (!) 159/86 (BP Location: Left Arm)   Pulse 89   Temp 98.9 F (37.2 C) (Oral)   Resp 18   Ht 5\' 7"  (1.702 m)   Wt 81.6 kg   SpO2 100%   BMI 28.19 kg/m   Heart: fast rate, no arrhythmia appreciated. Sternum/epigatric area tender to palpation  A/P: 77yo male with atypical ACS presentation but cardiac h/o CAD with stent placement and pacemaker for heart block  Ordered EKG, trended troponins, SL nitro x3, GI cocktail, topical nitro No improvement in pain with tx Troponin resulted 0.10 Consulted cardiology: Better BP, HR control Recommended labetalol PRN (patient is paced) Metoprolol  Morphine PRN No heparin drip currently Trend troponins and cath if significantly elevated  Richarda Osmond, DO 10/06/2018, 4:43 AM PGY-1, [facility] pager [phone]

## 2018-10-07 DIAGNOSIS — R410 Disorientation, unspecified: Secondary | ICD-10-CM

## 2018-10-07 DIAGNOSIS — E860 Dehydration: Secondary | ICD-10-CM

## 2018-10-07 DIAGNOSIS — F0390 Unspecified dementia without behavioral disturbance: Secondary | ICD-10-CM

## 2018-10-07 DIAGNOSIS — I160 Hypertensive urgency: Secondary | ICD-10-CM

## 2018-10-07 DIAGNOSIS — Z950 Presence of cardiac pacemaker: Secondary | ICD-10-CM

## 2018-10-07 LAB — CBC WITH DIFFERENTIAL/PLATELET
Abs Immature Granulocytes: 0.03 10*3/uL (ref 0.00–0.07)
Basophils Absolute: 0.1 10*3/uL (ref 0.0–0.1)
Basophils Relative: 1 %
Eosinophils Absolute: 0.3 10*3/uL (ref 0.0–0.5)
Eosinophils Relative: 3 %
HCT: 36.4 % — ABNORMAL LOW (ref 39.0–52.0)
Hemoglobin: 12 g/dL — ABNORMAL LOW (ref 13.0–17.0)
Immature Granulocytes: 0 %
Lymphocytes Relative: 16 %
Lymphs Abs: 1.4 10*3/uL (ref 0.7–4.0)
MCH: 31.7 pg (ref 26.0–34.0)
MCHC: 33 g/dL (ref 30.0–36.0)
MCV: 96.3 fL (ref 80.0–100.0)
Monocytes Absolute: 1 10*3/uL (ref 0.1–1.0)
Monocytes Relative: 12 %
Neutro Abs: 5.8 10*3/uL (ref 1.7–7.7)
Neutrophils Relative %: 68 %
Platelets: 275 10*3/uL (ref 150–400)
RBC: 3.78 MIL/uL — ABNORMAL LOW (ref 4.22–5.81)
RDW: 12.9 % (ref 11.5–15.5)
WBC: 8.7 10*3/uL (ref 4.0–10.5)
nRBC: 0 % (ref 0.0–0.2)

## 2018-10-07 LAB — GLUCOSE, CAPILLARY
Glucose-Capillary: 97 mg/dL (ref 70–99)
Glucose-Capillary: 99 mg/dL (ref 70–99)
Glucose-Capillary: 149 mg/dL — ABNORMAL HIGH (ref 70–99)
Glucose-Capillary: 145 mg/dL — ABNORMAL HIGH (ref 70–99)
Glucose-Capillary: 193 mg/dL — ABNORMAL HIGH (ref 70–99)

## 2018-10-07 LAB — BASIC METABOLIC PANEL
Anion gap: 8 (ref 5–15)
BUN: 48 mg/dL — ABNORMAL HIGH (ref 8–23)
CO2: 19 mmol/L — ABNORMAL LOW (ref 22–32)
Calcium: 8.5 mg/dL — ABNORMAL LOW (ref 8.9–10.3)
Chloride: 111 mmol/L (ref 98–111)
Creatinine, Ser: 4.08 mg/dL — ABNORMAL HIGH (ref 0.61–1.24)
GFR calc Af Amer: 15 mL/min — ABNORMAL LOW (ref >60)
GFR calc non Af Amer: 13 mL/min — ABNORMAL LOW (ref >60)
Glucose, Bld: 108 mg/dL — ABNORMAL HIGH (ref 70–99)
Potassium: 4.7 mmol/L (ref 3.5–5.1)
Sodium: 138 mmol/L (ref 135–145)

## 2018-10-07 LAB — VITAMIN B12: Vitamin B-12: 722 pg/mL (ref 180–914)

## 2018-10-07 LAB — HEMOGLOBIN A1C
Hgb A1c MFr Bld: 6.3 % — ABNORMAL HIGH (ref 4.8–5.6)
Mean Plasma Glucose: 134 mg/dL

## 2018-10-07 LAB — MAGNESIUM: Magnesium: 2 mg/dL (ref 1.7–2.4)

## 2018-10-07 MED ORDER — METOPROLOL TARTRATE 50 MG PO TABS
150.0000 mg | ORAL_TABLET | Freq: Two times a day (BID) | ORAL | Status: DC
  Administered 2018-10-07 – 2018-10-21 (×29): 150 mg via ORAL
  Filled 2018-10-07 (×29): qty 1

## 2018-10-07 MED ORDER — METOPROLOL TARTRATE 50 MG PO TABS: 150.0000 mg | ORAL_TABLET | Freq: Two times a day (BID) | ORAL | Status: DC

## 2018-10-07 MED ORDER — SODIUM CHLORIDE 0.9 % IV SOLN
INTRAVENOUS | Status: DC
  Administered 2018-10-08 – 2018-10-12 (×6): via INTRAVENOUS

## 2018-10-07 NOTE — Care Management Important Message (Signed)
Important Message  Patient Details  Name: William [name] MRN: [id number] Date of Birth: Oct 05, 1941   Medicare Important Message Given:  Yes    Keagen Heinlen P Julienne Vogler 10/07/2018, 4:01 PM

## 2018-10-07 NOTE — Progress Notes (Addendum)
Progress Note  Patient Name: William [name] Date of Encounter: 10/07/2018  Primary Cardiologist: New to [facility]   Subjective   77 year old gentleman with a history of coronary artery disease-status post PCI, hyperlipidemia, history of heart block with pacemaker implantation. He presented to the hospital yesterday with mental status changes and having fallen. Troponin levels were minimally elevated.  He has acute renal failure. Echocardiogram from October 05, 2018 reveals vigorous left ventricular systolic function with an ejection fraction of 65 to 70%.  He has grade 1 diastolic dysfunction.  He has mild aortic stenosis.  He presented with some atypical chest pain.  Chest pain was not relieved with sublingual nitroglycerin.  It was more likely due to gastroesophageal reflux.   Has had several episodes of nonsustained ventricular tachycardia.  Been gradually increasing his metoprolol. Echocardiogram reveals normal left ventricular systolic function.   Inpatient Medications    Scheduled Meds: . amLODipine  10 mg Oral Daily  . aspirin EC  81 mg Oral Daily  . calcium carbonate  1 tablet Oral Once  . enoxaparin (LOVENOX) injection  30 mg Subcutaneous Q24H  . feeding supplement (ENSURE ENLIVE)  237 mL Oral BID BM  . insulin aspart  0-9 Units Subcutaneous TID WC  . metoprolol tartrate  150 mg Oral BID  . pantoprazole  20 mg Oral Daily  . rosuvastatin  10 mg Oral QHS   Continuous Infusions:  PRN Meds: acetaminophen **OR** acetaminophen, hydrALAZINE, labetalol, prochlorperazine   Vital Signs    Vitals:   10/06/18 2012 10/07/18 0336 10/07/18 0422 10/07/18 0913  BP: (!) 155/83 (!) 170/102 (!) 163/81 (!) 156/86  Pulse: 81 67 65 76  Resp: 20 18 20 18   Temp: 98.3 F (36.8 C) 98.3 F (36.8 C) 98.2 F (36.8 C) 98.1 F (36.7 C)  TempSrc: Oral Oral Oral Oral  SpO2: 100% 100% 100% 100%  Weight:   81.3 kg   Height:        Intake/Output Summary (Last 24 hours) at 10/07/2018  1049 Last data filed at 10/07/2018 0600 Gross per 24 hour  Intake 775 ml  Output 500 ml  Net 275 ml   Filed Weights   10/04/18 1703 10/07/18 0422  Weight: 81.6 kg 81.3 kg    Telemetry  V pacing ,  Had a 30+ beat run of NSVT  - Personally Reviewed  ECG      - Personally Reviewed  Physical Exam    GEN: No acute distress.   Neck: No JVD Cardiac: RRR, no murmurs, rubs, or gallops.  Respiratory: Clear to auscultation bilaterally. GI: Soft, nontender, non-distended  MS: No edema; No deformity. Neuro:  Nonfocal  Psych: Normal affect   Labs    Chemistry Recent Labs  Lab 10/04/18 1700 10/05/18 0625 10/06/18 0219 10/07/18 0357  NA 138 137 137 138  K 5.1 4.0 3.3* 4.7  CL 106 109 112* 111  CO2 16* 16* 13* 19*  GLUCOSE 156* 95 124* 108*  BUN 62* 55* 48* 48*  CREATININE 5.26* 4.40* 4.08* 4.08*  CALCIUM 8.9 8.4* 8.1* 8.5*  PROT 7.3  --   --   --   ALBUMIN 3.6  --   --   --   AST 30  --   --   --   ALT 15  --   --   --   ALKPHOS 80  --   --   --   BILITOT 1.3*  --   --   --  GFRNONAA 10* 12* 13* 13*  GFRAA 11* 14* 15* 15*  ANIONGAP 16* 12 12 8      Hematology Recent Labs  Lab 10/05/18 1505 10/06/18 0933 10/07/18 0357  WBC 10.4 11.5* 8.7  RBC 4.02* 3.73* 3.78*  HGB 12.6* 12.2* 12.0*  HCT 37.9* 35.2* 36.4*  MCV 94.3 94.4 96.3  MCH 31.3 32.7 31.7  MCHC 33.2 34.7 33.0  RDW 12.7 12.8 12.9  PLT 287 253 275    Cardiac Enzymes Recent Labs  Lab 10/06/18 0219 10/06/18 0706 10/06/18 1836  TROPONINI 0.10* 0.05* 0.06*    Recent Labs  Lab 10/04/18 1725  TROPIPOC 0.03     BNPNo results for input(s): BNP, PROBNP in the last 168 hours.   DDimer No results for input(s): DDIMER in the last 168 hours.   Radiology    No results found.  Cardiac Studies      Patient Profile     77 y.o. male with a history of coronary artery disease.  He presents with acute renal failure, mental status changes and weakness.  He was found to have a minimally elevated  troponin level.  Assessment & Plan    1.  Elevated troponin level: His elevated troponin level is most likely related to his acute renal insufficiency. No evidence of angina.  2.  Acute renal failure:   3.  Nonsustained ventricular tachycardia: Several episodes of nonsustained ventricular tachycardia.  The first 1 occurred when his potassium level was low. The second occurred last night when his potassium level has been replaced.  Magnesium level is 2.0.  We have increased his metoprolol to 150 mg twice a day. He has normal left ventricular systolic function.  He does have some left ventricular hypertrophy.  4.  Pacemaker: His daughters have said that he will need to establish pacer care here in [place]. He has been going to [place].  Will send a message to our EP scheduler    Bliss will sign off.   Medication Recommendations:  Continue current meds  Other recommendations (labs, testing, etc):   Follow up as an outpatient:  Sab[facility] Piechocki and With EP     For questions or updates, please contact [facility] Please consult [email or web address] for contact info under        Signed, Mertie Moores, MD  10/07/2018, 10:49 AM

## 2018-10-07 NOTE — NC FL2 (Signed)
[place] LEVEL OF CARE SCREENING TOOL     IDENTIFICATION  Patient Name: William [name] Birthdate: 05/12/1941 Sex: male Admission Date (Current Location): 10/04/2018  [facility] and [place] Number:  Herbalist and Address:  [facility], [place] [address] [place], [place] [address]      Provider Number: [id number]  Attending Physician Name and Address:  Lind Covert, MD  Relative Name and Phone Number:  Kaan Tosh, Daughter, [phone]; Carnie Bruemmer, Daughter, [phone]; Rameen Quinney, Daughter, [phone]    Current Level of Care: Hospital Recommended Level of Care: [place] Prior Approval Number:    Date Approved/Denied: 10/07/18 PASRR Number: [id number]  Discharge Plan: SNF    Current Diagnoses: Patient Active Problem List   Diagnosis Date Noted  . Dehydration   . Dementia without behavioral disturbance ([place])   . Hypertensive urgency   . Altered mental status   . AKI (acute kidney injury) ([place]) 10/04/2018  . Type 2 diabetes mellitus without complication, without long-term current use of insulin ([place]) 10/30/2017  . Benign essential HTN 10/30/2017  . Hyperlipidemia 10/30/2017    Orientation RESPIRATION BLADDER Height & Weight     Self, Place  Normal Incontinent(Has external catheter) Weight: 179 lb 3.7 oz (81.3 kg) Height:  5\' 7"  (170.2 cm)  BEHAVIORAL SYMPTOMS/MOOD NEUROLOGICAL BOWEL NUTRITION STATUS      Continent Diet(Renal diet, thin liquids, liquid restriciton to 1273ml)  AMBULATORY STATUS COMMUNICATION OF NEEDS Skin   Limited Assist Verbally Normal(Has MASD on sacrum and is treated with foam and dressing)                       Personal Care Assistance Level of Assistance  Bathing, Feeding, Dressing, Total care Bathing Assistance: Limited assistance Feeding assistance: Independent Dressing Assistance: Limited assistance Total Care Assistance: Limited assistance    Functional Limitations Info  Sight, Hearing, Speech Sight Info: Adequate Hearing Info: Adequate Speech Info: Adequate    SPECIAL CARE FACTORS FREQUENCY  PT (By licensed PT), OT (By licensed OT)     PT Frequency: 5x/wk OT Frequency: 5x/wk            Contractures Contractures Info: Not present    Additional Factors Info  Code Status, Insulin Sliding Scale, Allergies Code Status Info: Full Code Allergies Info: Carvedilol   Insulin Sliding Scale Info: Insulin aspart novolog injection 0-9 unites, 3x daily with meals       Current Medications (10/07/2018):  This is the current hospital active medication list Current Facility-Administered Medications  Medication Dose Route Frequency Provider Last Rate Last Dose  . acetaminophen (TYLENOL) tablet 650 mg  650 mg Oral Q6H PRN Kathrene Alu, MD       Or  . acetaminophen (TYLENOL) suppository 650 mg  650 mg Rectal Q6H PRN Kathrene Alu, MD      . amLODipine (NORVASC) tablet 10 mg  10 mg Oral Daily Kathrene Alu, MD   10 mg at 10/07/18 1057  . aspirin EC tablet 81 mg  81 mg Oral Daily Beard, Samantha N, DO   81 mg at 10/07/18 1057  . calcium carbonate (TUMS - dosed in mg elemental calcium) chewable tablet 200 mg of elemental calcium  1 tablet Oral Once Anderson, Chelsey L, DO      . enoxaparin (LOVENOX) injection 30 mg  30 mg Subcutaneous Q24H Winfrey, Amanda C, MD   30 mg at 10/07/18 1058  . feeding supplement (  ENSURE ENLIVE) (ENSURE ENLIVE) liquid 237 mL  237 mL Oral BID BM Beard, Samantha N, DO   237 mL at 10/07/18 1057  . hydrALAZINE (APRESOLINE) injection 10 mg  10 mg Intravenous Q4H PRN Kathrene Alu, MD   10 mg at 10/06/18 0106  . insulin aspart (novoLOG) injection 0-9 Units  0-9 Units Subcutaneous TID WC Winfrey, Amanda C, MD      . labetalol (NORMODYNE,TRANDATE) injection 5 mg  5 mg Intravenous Q2H PRN Anderson, Chelsey L, DO      . metoprolol tartrate (LOPRESSOR) tablet 150 mg  150 mg Oral BID Lind Covert, MD   150 mg at 10/07/18 1144  . pantoprazole (PROTONIX) EC tablet 20 mg  20 mg Oral Daily Milus Banister C, DO   20 mg at 10/07/18 1057  . prochlorperazine (COMPAZINE) injection 10 mg  10 mg Intravenous Q6H PRN Milus Banister C, DO   10 mg at 10/05/18 1535  . rosuvastatin (CRESTOR) tablet 10 mg  10 mg Oral QHS Kathrene Alu, MD   10 mg at 10/06/18 2217     Discharge Medications: Please see discharge summary for a list of discharge medications.  Relevant Imaging Results:  Relevant Lab Results:   Additional Information SSN: [id number]  Philippa Chester Tennile Styles, LCSWA

## 2018-10-07 NOTE — Consult Note (Addendum)
[place] Nurse wound consult note Reason for Consult: Consult requested for buttocks/sacrum.  Pt has generalized moisture associated skin damage (MASD) related to moisture and also possibly candidiasis to bilat buttocks/sacrum/inner gluteal fold.  Pt c/o itching and burning. Wound type: MASD affected area is approx 3X3cm, red and macerated to outer buttocks Pressure Injury POA: This is NOT a pressure injury Measurement: 1.5X.2X.1cm partial thickness fissure related to moisture to inner gluteal fold is red and moist Dressing procedure/placement/frequency: Leave dressing off of the affected area and apply antifungal powder with each turning and cleaning episode. Discussed plan of care with patient. Please re-consult if further assistance is needed.  Thank-you,  Julien Girt MSN, [place], [place], [place], [place]

## 2018-10-07 NOTE — Progress Notes (Signed)
Physical Therapy Treatment Patient Details Name: William [name] MRN: [id number] DOB: [birth year] Today's Date: 10/07/2018    History of Present Illness William [name] is a 77 y.o. male presenting after a fall with headache, found to have altered mental status, AKI, and hypertensive urgency. 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    PT Comments    Pt received in bed, agreeable to participation in therapy. Pt's confusion persists. Oriented to self and time. He required min assist bed mobility, min assist transfers, and min assist ambulation 150 feet with RW. Pt positioned in recliner with feet elevated at end of session. Current POC remains appropriate.    Follow Up Recommendations  SNF;Supervision for mobility/OOB     Equipment Recommendations  Other (comment)(defer to next venue)    Recommendations for Other Services       Precautions / Restrictions Precautions Precautions: Fall Restrictions Weight Bearing Restrictions: No    Mobility  Bed Mobility Overal bed mobility: Needs Assistance Bed Mobility: Supine to Sit     Supine to sit: HOB elevated;Min assist     General bed mobility comments: assist to elevate trunk  Transfers Overall transfer level: Needs assistance Equipment used: Rolling walker (2 wheeled) Transfers: Sit to/from Stand Sit to Stand: Min assist         General transfer comment: cues for hand placement. Assist to power up  Ambulation/Gait Ambulation/Gait assistance: Min assist Gait Distance (Feet): 150 Feet Assistive device: Rolling walker (2 wheeled) Gait Pattern/deviations: Shuffle;Step-through pattern;Decreased stride length;Drifts right/left Gait velocity: decreased Gait velocity interpretation: <1.31 ft/sec, indicative of household ambulator General Gait Details: decreased foot clearance bilat. Assist with RW management as pt drifting  R/L.   Stairs             Wheelchair Mobility    Modified Rankin (Stroke Patients Only)       Balance Overall balance assessment: Needs assistance;History of Falls Sitting-balance support: Feet supported;No upper extremity supported Sitting balance-Leahy Scale: Fair     Standing balance support: During functional activity;Bilateral upper extremity supported Standing balance-Leahy Scale: Poor Standing balance comment: reliant on BUE during amb                            Cognition Arousal/Alertness: Awake/alert Behavior During Therapy: WFL for tasks assessed/performed Overall Cognitive Status: Impaired/Different from baseline Area of Impairment: Orientation;Memory;Following commands;Safety/judgement;Problem solving                 Orientation Level: Disoriented to;Situation;Place   Memory: Decreased short-term memory Following Commands: Follows one step commands with increased time;Follows multi-step commands inconsistently Safety/Judgement: Decreased awareness of safety   Problem Solving: Slow processing;Difficulty sequencing;Requires verbal cues;Requires tactile cues General Comments: Pt reports feeling confused, thinking he was at home. Pt verbalizing concerns about someone putting his house on [employer] for rent. Stating people were coming to his house to look at it at all hours of the night. Unsure of the validity of his concerns. Cues needed to stay on task.       Exercises      General Comments        Pertinent Vitals/Pain Pain Assessment: No/denies pain    Home Living                      Prior Function            PT  Goals (current goals can now be found in the care plan section) Acute Rehab PT Goals Patient Stated Goal: to go home PT Goal Formulation: With patient Time For Goal Achievement: 10/20/18 Potential to Achieve Goals: Good Progress towards PT goals: Progressing toward goals    Frequency    Min  3X/week      PT Plan Current plan remains appropriate    Co-evaluation              AM-PAC PT "6 Clicks" Mobility   Outcome Measure  Help needed turning from your back to your side while in a flat bed without using bedrails?: A Little Help needed moving from lying on your back to sitting on the side of a flat bed without using bedrails?: A Little Help needed moving to and from a bed to a chair (including a wheelchair)?: A Little Help needed standing up from a chair using your arms (e.g., wheelchair or bedside chair)?: A Little Help needed to walk in hospital room?: A Little Help needed climbing 3-5 steps with a railing? : A Lot 6 Click Score: 17    End of Session Equipment Utilized During Treatment: Gait belt Activity Tolerance: Patient tolerated treatment well Patient left: in chair;with chair alarm set;with call bell/phone within reach Nurse Communication: Mobility status PT Visit Diagnosis: Unsteadiness on feet (R26.81);Difficulty in walking, not elsewhere classified (R26.2)     Time: 8177-1165 PT Time Calculation (min) (ACUTE ONLY): 15 min  Charges:  $Gait Training: 8-22 mins                     Lorrin Goodell, PT  Office # [phone] Pager [phone]    William [name] 10/07/2018, 10:19 AM

## 2018-10-07 NOTE — Clinical Social Work Note (Signed)
Clinical Social Work Assessment  Patient Details  Name: William [name] MRN: [id number] Date of Birth: 1941-02-16  Date of referral:  10/07/18               Reason for consult:  Facility Placement, Discharge Planning                Permission sought to share information with:  Family Supports Permission granted to share information::  Yes, Verbal Permission Granted  Name::     William [name]  Agency::     Relationship::  Daughter  Contact Information:  [phone]  Housing/Transportation Living arrangements for the past 2 months:  Single Family Home Source of Information:  Adult Children(Talked with daughter William [name] in patient's room) Patient Interpreter Needed:  None Criminal Activity/Legal Involvement Pertinent to Current Situation/Hospitalization:  No - Comment as needed Significant Relationships:  Adult Children Lives with:  Self Do you feel safe going back to the place where you live?  No Need for family participation in patient care:  Yes (Comment)  Care giving concerns: Daughter reported that patient lives alone. She indicated that after rehab her dad will probably come to live with her.  Social Worker assessment / plan: CSW talked with patient and daughter at the bedside regarding discharge disposition and recommendation of ST rehab. Mr. Tuckerman was sitting in a chair and was alert, pleasant and participated in the conversation, however his comments generally did not coincide with what CSW and daughter were talking about.   Daughter reported that she has 3 siblings: [place] and [place] who live in [place] and [place] who lives in [place]. Daughter reported that her dad has never been to rehab before, so the facility search process and short-term rehab explained. Daughter expressed agreement with ST rehab for her dad and was provided with a SNF list. When asked, daughter explained that prior to hospitalization her dad could take care of himself and generally ate out.  Employment  status:  Retired Research officer, political party) PT Recommendations:  [place] / Referral to community resources:  Skilled Nursing Facility(Daughter provided with SNF list)  Patient/Family's Response to care:  Daughter expressed no concerns regarding her dad's care during hospitalization.  Patient/Family's Understanding of and Emotional Response to Diagnosis, Current Treatment, and Prognosis:  Daughter expressed understanding and agreement with ST rehab for her dad.  Emotional Assessment Appearance:  Appears stated age Attitude/Demeanor/Rapport:  Other(Appropriate) Affect (typically observed):  Pleasant Orientation:  Oriented to Self, Oriented to Place Alcohol / Substance use:  Never Used Psych involvement (Current and /or in the community):  Yes (Comment)  Discharge Needs  Concerns to be addressed:  Discharge Planning Concerns Readmission within the last 30 days:  No Current discharge risk:  None Barriers to Discharge:  Continued Medical Work up   [employer] Mila Homer, [place] 10/07/2018, 6:22 PM

## 2018-10-07 NOTE — Progress Notes (Signed)
Family Medicine Teaching Service Daily Progress Note Intern Pager: [phone]  Patient name: NICHOLAS TROMPETER Medical record number: [id number] Date of birth: [birth year] Age: 77 y.o. Gender: male  Primary Care Provider: Patient, No Pcp Per Consultants: None Code Status: DNR  Pt Overview and Major Events to Date:  10/04/2018-patient admitted for headache, fall  Assessment and Plan: Sequoia Mincey is a 77 year old male presenting after a fall with a headache, found to have altered mental status, AKI, and hypertensive urgency.  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  ?AMS likely in setting of underlying dementia: Improved.  Pt has been alert and oriented X3 for duration of admission, but continues to be more confused about his past/life events and forgetting objects etc. Family has noticed a steady decline over the past year.  Current presentation is more likely in the setting of progressing dementia, however initial presentation may have been related to dehydration and elevated blood pressures.  B12 normal, folate pending.  PT and OT recommending SNF. - Consult palliative care, further goals of care discussion with family especially in the setting of worsening renal function and dementia  - Follow-up folate - Monitor BMP, CBC -Consider [place] -Follow-up with social work on SNF placement   AKI on CKD stage 3B:  Unchanged CR 4.08, same as yesterday. 5.26 on admit, apparent baseline appears to be around 1.6-1.7, however his new baseline is likely higher than this as last creatinine was from 10/2017. No signs of obstruction via renal U/S, suggesting medical renal disease.  In addition, FeNa 1.6 present also suggesting intrinsic etiology. Likely multifactorial, in the setting of dehydration, chronic uncontrolled hypertension, T2 DM.  -Consult nephrology for further  recommendations/interventions - Monitor BMP -Encourage p.o. intake - Avoid nephrotoxic medications as possible, holding home losartan and HCTZ - Follow-up urine culture - Strict I&O  Non-sustained ventricular tachycardia: Recurrent. Previously had a 30+ beat run of nonsustained VT on 12/25, had a 15 beat run overnight.  Asymptomatic both times.  Thought to be likely secondary to hypokalemia previously with a K of 3.3, however overnight his potassium was WNL. Mag wnl.  -Monitor on telemetry - Cardiology increase his metoprolol to 150 twice daily  Hypertensive Urgency, in setting of Chronic Hypertension: Acute, improving.  BP ranging 150-170 overnight.  Echo 12/25 EF 65-70%, G1 DD, and pattern of severe LVH.  -Cardiology increase Lopressor to 150 twice daily -Continue home amlodipine 10 mg -Hydralazine 10 mg every 4 PRN SBP >180, DBP >100  -Holding home losartan 100mg  and HCTZ 25mg  in the setting of AKI -Monitor BP   Chest pain: Resolved. Patient without any further chest pain this morning.  Troponins 0.1>0.05>0.06, likely demand secondary to his hypertension vs kidney disease.  Chest pain more likely related to reflux, as has improved with GI relief medications.  LDL 181, question compliance to home rosuvastatin. -Cardiology on board, appreciate additional recommendations -Continue aspirin 81 mg -Continue rosuvastatin 10 mg   Hypokalemia: Resolved. K4.7, from 3.3 yesterday.  -Monitor BMP  Diarrhea, Acute: Improved. No further episodes since yesterday morning.  Leukocytosis resolved, 8.7 from 11.5 yesterday. -Encourage p.o. intake -Monitor fever curve, CBC -Strict I's and O's  Type 2 Diabetes Mellitus: Chronic, stable.  A1c 6.3.  CBG average low 100s.  Has not required any SSI. -Sensitive SSI -CBGs before meals and nightly -Holding home Janumet and Januvia while inpatient (however per PCP docs, Janumet was D/C in 10/2017)  Previous complete heart block with pacemaker in  place: Stable.  See chest pain assessment above.  EKG paced rhythm.  -Cardiology on board, sent message to EP to establish care -Monitor vitals  Sacral decubitus ulcer, stage I: Stable.  Early signs of sacral decubitus skin breakdown found on physical exam on 12/25 per nursing.  Noted to be draining by RN this morning, without further signs of infection. -Continue to monitor  -Wound care consult, appreciate recommendations  CAD s/p PCI:  Chronic, stable See chest pain assessment as above. -Continue home rosuvastatin 10 mg daily -Start Lopressor 150 mg twice daily, from 100 twice daily -Cont aspirin 81 mg for secondary prevention  FEN/GI: Carb modified diet Prophylaxis: Lovenox for decreased creatinine clearance  Disposition:  Continue medical management, will need to meet with palliative care to discuss further goals of care considering progressing dementia with severe renal disease.  Subjective:  Doing well this morning, denies any chest pain, palpitations, shortness of breath, or reflux symptoms.  Ate all of his breakfast this morning, previously was not eating much yesterday.  No further BM recorded.    Objective: Temp:  [98.1 F (36.7 C)-98.6 F (37 C)] 98.1 F (36.7 C) (12/27 0913) Pulse Rate:  [65-81] 76 (12/27 0913) Resp:  [18-20] 18 (12/27 0913) BP: (155-170)/(81-102) 156/86 (12/27 0913) SpO2:  [100 %] 100 % (12/27 0913) Weight:  [81.3 kg] 81.3 kg (12/27 0422)  Physical exam:  General: Alert, NAD HEENT: NCAT, MMM, oropharynx nonerythematous  Cardiac: RRR no m/g/r Lungs: Clear bilaterally, no increased WOB   Abdomen: soft, non-tender, non-distended, normoactive BS Msk: Moves all extremities spontaneously  Ext: Warm, dry, 2+ distal pulses, no edema  Psych: Alert and oriented, however does tend to be confused about basic tasks.  Laboratory: Recent Labs  Lab 10/05/18 1505 10/06/18 0933 10/07/18 0357  WBC 10.4 11.5* 8.7  HGB 12.6* 12.2* 12.0*  HCT 37.9* 35.2*  36.4*  PLT 287 253 275   Recent Labs  Lab 10/04/18 1700 10/05/18 0625 10/06/18 0219 10/07/18 0357  NA 138 137 137 138  K 5.1 4.0 3.3* 4.7  CL 106 109 112* 111  CO2 16* 16* 13* 19*  BUN 62* 55* 48* 48*  CREATININE 5.26* 4.40* 4.08* 4.08*  CALCIUM 8.9 8.4* 8.1* 8.5*  PROT 7.3  --   --   --   BILITOT 1.3*  --   --   --   ALKPHOS 80  --   --   --   ALT 15  --   --   --   AST 30  --   --   --   GLUCOSE 156* 95 124* 108*   Imaging/Diagnostic Tests: Dg Chest 2 View  Result Date: 10/04/2018 CLINICAL DATA:  Confusion, fatigue, rule out occult infection. Pt only c/o diarrhea to xray staff, stating he's had diarrhea for weeks. Hx of HTN, DM.confusion, fatigue, rule out occult infection EXAM: CHEST - 2 VIEW COMPARISON:  10/18/2017 FINDINGS: LEFT-sided pacemaker overlies normal cardiac silhouette. No effusion, infiltrate, pneumothorax. Degenerative osteophytosis of the spine. IMPRESSION: No acute cardiopulmonary process. Electronically Signed   By: Suzy Bouchard M.D.   On: 10/04/2018 18:31   Ct Head Wo Contrast  Result Date: 10/04/2018 CLINICAL DATA:  fall, AMS, hx of brain surgeryAltered level of consciousness (LOC), unexplained fall, AMS, hx of brain surgery EXAM: CT HEAD WITHOUT CONTRAST TECHNIQUE: Contiguous axial images were obtained from the base of the skull through the vertex without intravenous contrast. COMPARISON:  None. FINDINGS: Brain:  No acute intracranial hemorrhage. No focal mass lesion. No CT evidence of acute infarction. No midline shift or mass effect. No hydrocephalus. Basilar cisterns are patent. There are periventricular and subcortical white matter hypodensities. Generalized cortical atrophy. Vascular: No hyperdense vessel or unexpected calcification. Skull: Prior RIGHT craniotomy. Sinuses/Orbits: Orbits normal. Coastal thickening in the ethmoid sinuses Other: None IMPRESSION: 1. No acute intracranial findings. 2. Atrophy and white matter microvascular disease.  Electronically Signed   By: Suzy Bouchard M.D.   On: 10/04/2018 19:56   US Renal  Result Date: 10/05/2018 CLINICAL DATA:  Acute onset of renal insufficiency. EXAM: RENAL / URINARY TRACT ULTRASOUND COMPLETE COMPARISON:  None. FINDINGS: Right Kidney: Renal measurements: 10.6 x 6.0 x 6.0 cm = volume: 202.9 mL. Increased parenchymal echogenicity is noted. A 1.5 cm cyst is noted at the interpole region of the right kidney. No hydronephrosis visualized. Left Kidney: Renal measurements: 9.6 x 4.9 x 5.2 cm = volume: 127.3 mL. Increased parenchymal echogenicity is noted. Cysts are noted at the lower pole and upper pole of the left kidney, measuring 3.2 cm and 1.5 cm in size. The larger cyst has a septation and question of a peripheral soft tissue component. No hydronephrosis visualized. Bladder: Appears normal for degree of bladder distention. IMPRESSION: 1. No evidence of hydronephrosis. 2. Increased renal parenchymal echogenicity raises concern for medical renal disease. 3. Bilateral renal cysts. A somewhat complex 3.2 cm cystic lesion at the lower pole of the left kidney has a septation and question of a peripheral soft tissue component. Contrast-enhanced CT or MRI would be helpful for further evaluation, on an elective nonemergent basis. Electronically Signed   By: Garald Balding M.D.   On: 10/05/2018 00:40   Patriciaann Clan, DO 10/07/2018, 9:18 AM PGY-1, [place] Intern pager: [phone], text pages welcome
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
## 2018-10-08 DIAGNOSIS — Z515 Encounter for palliative care: Secondary | ICD-10-CM

## 2018-10-08 DIAGNOSIS — Z7189 Other specified counseling: Secondary | ICD-10-CM

## 2018-10-08 DIAGNOSIS — R197 Diarrhea, unspecified: Secondary | ICD-10-CM

## 2018-10-08 LAB — CBC WITH DIFFERENTIAL/PLATELET
Abs Immature Granulocytes: 0.02 10*3/uL (ref 0.00–0.07)
Basophils Absolute: 0.1 10*3/uL (ref 0.0–0.1)
Basophils Relative: 1 %
Eosinophils Absolute: 0.3 10*3/uL (ref 0.0–0.5)
Eosinophils Relative: 4 %
HCT: 33.2 % — ABNORMAL LOW (ref 39.0–52.0)
Hemoglobin: 10.9 g/dL — ABNORMAL LOW (ref 13.0–17.0)
Immature Granulocytes: 0 %
Lymphocytes Relative: 18 %
Lymphs Abs: 1.1 10*3/uL (ref 0.7–4.0)
MCH: 31.8 pg (ref 26.0–34.0)
MCHC: 32.8 g/dL (ref 30.0–36.0)
MCV: 96.8 fL (ref 80.0–100.0)
Monocytes Absolute: 0.6 10*3/uL (ref 0.1–1.0)
Monocytes Relative: 9 %
Neutro Abs: 4 10*3/uL (ref 1.7–7.7)
Neutrophils Relative %: 68 %
Platelets: 243 10*3/uL (ref 150–400)
RBC: 3.43 MIL/uL — ABNORMAL LOW (ref 4.22–5.81)
RDW: 12.7 % (ref 11.5–15.5)
WBC: 6 10*3/uL (ref 4.0–10.5)
nRBC: 0 % (ref 0.0–0.2)

## 2018-10-08 LAB — BASIC METABOLIC PANEL
Anion gap: 6 (ref 5–15)
BUN: 40 mg/dL — ABNORMAL HIGH (ref 8–23)
CO2: 14 mmol/L — ABNORMAL LOW (ref 22–32)
Calcium: 7 mg/dL — ABNORMAL LOW (ref 8.9–10.3)
Chloride: 120 mmol/L — ABNORMAL HIGH (ref 98–111)
Creatinine, Ser: 3.13 mg/dL — ABNORMAL HIGH (ref 0.61–1.24)
GFR calc Af Amer: 21 mL/min — ABNORMAL LOW (ref >60)
GFR calc non Af Amer: 18 mL/min — ABNORMAL LOW (ref >60)
Glucose, Bld: 96 mg/dL (ref 70–99)
Potassium: 3.2 mmol/L — ABNORMAL LOW (ref 3.5–5.1)
Sodium: 140 mmol/L (ref 135–145)

## 2018-10-08 LAB — PROTEIN / CREATININE RATIO, URINE
Creatinine, Urine: 39.34 mg/dL
Protein Creatinine Ratio: 5.44 mg/mg{Cre} — ABNORMAL HIGH (ref 0.00–0.15)
Total Protein, Urine: 214 mg/dL

## 2018-10-08 LAB — GLUCOSE, CAPILLARY
Glucose-Capillary: 108 mg/dL — ABNORMAL HIGH (ref 70–99)
Glucose-Capillary: 102 mg/dL — ABNORMAL HIGH (ref 70–99)
Glucose-Capillary: 134 mg/dL — ABNORMAL HIGH (ref 70–99)
Glucose-Capillary: 117 mg/dL — ABNORMAL HIGH (ref 70–99)

## 2018-10-08 MED ORDER — POTASSIUM CHLORIDE CRYS ER 20 MEQ PO TBCR
40.0000 meq | EXTENDED_RELEASE_TABLET | Freq: Once | ORAL | Status: AC
  Administered 2018-10-08: 40 meq via ORAL
  Filled 2018-10-08: qty 2

## 2018-10-08 MED ORDER — SODIUM BICARBONATE 650 MG PO TABS
650.0000 mg | ORAL_TABLET | Freq: Two times a day (BID) | ORAL | Status: DC
  Administered 2018-10-08 – 2018-10-12 (×9): 650 mg via ORAL
  Filled 2018-10-08 (×10): qty 1

## 2018-10-08 NOTE — Progress Notes (Signed)
Pt up all night trying to get out of bed,pt pulled is condom cath off and trying to pull his IV out,will not follow simple commands.Bil mittens place for safety will continue to monitor

## 2018-10-08 NOTE — Progress Notes (Signed)
Patient is very confused.Keep on pulling out his telemetry box several times early this morning,but this time patient is very resistive and putting it back to him.

## 2018-10-08 NOTE — Progress Notes (Signed)
Family Medicine Teaching Service Daily Progress Note Intern Pager: [phone]  Patient name: William [name] Medical record number: [id number] Date of birth: [birth year] Age: 77 y.o. Gender: male  Primary Care Provider: Patient, No Pcp Per Consultants: None Code Status: DNR  Pt Overview and Major Events to Date:  10/04/2018-patient admitted for headache, fall  Assessment and Plan: William [name] is a 77 year old male presenting after a fall with a headache, found to have altered mental status, AKI, and hypertensive urgency.  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  AMS likely in setting of underlying dementia: Improved.  Alert and oriented x1 this am, he was acting restless and confused overnight per nursing note and needed soft restraints.  He was pleasant and conversational this morning though disoriented.  This is my first experience with the patient difficult to discern if this was a new altered status or his baseline.  He does seem to have a chronic slow mental decline consistent with dementia.  I would like to follow-up with some basic labs (especially BUN) though this is difficult because patient is refusing blood draws.  PT and OT recommending SNF.  Based off of my morning exam, this patient lacks capacity. - Consult palliative care, further goals of care discussion with family especially in the setting of worsening renal function and dementia  - Follow-up folate -Consider [facility] -Follow-up with social work on SNF placement  - lunch time labs (potentially best mental status at lunch if he is sundowning)  AKI on CKD stage IV:   5.26 on admit, apparent baseline appears to be around 1.6-1.7, nephrology suspects dehydration in the setting of diarrhea and continued use of HCTZ and losartan.  Will follow nephrology Rex of holding losartan and HCTZ in addition to light hydration.   Urine culture with under 10,000 CFU's. -Nephrology following -Hold losartan and HCTZ -Light hydration with NS at 75 mLs/hr - Follow-up protein/creatinine ratio - Avoid nephrotoxic medications as possible, holding home losartan and HCTZ - Strict I&O  Non-sustained ventricular tachycardia: Recurrent. No new events since 12/25.  Thought to be due to electrolyte imbalance. -Monitor on telemetry -Continue metoprolol 150 mg twice daily  Hypertensive Urgency, in setting of Chronic Hypertension: Acute, improving.  SBP 157-170, DBP 87-102 in the past 24 hours (12/28).  Echo 12/25 EF 65-70%, G1 DD, and pattern of severe LVH.  -Continue metoprolol 150 mg twice daily -Continue amlodipine 10 mg -Hydralazine 10 mg every 4 PRN SBP >180, DBP >100  -Holding home losartan 100mg  and HCTZ 25mg  in the setting of AKI -Monitor BP   Chest pain: Resolved. Cardiology noted that his elevated troponins initially are likely related to his renal insufficiency.  There is minimal concern for his nonsustained V. tach.  Cardiology she has signed off and plan to have him follow-up outpatient for continued pacer care. -Cardiology signed off -Continue aspirin 81 mg -Continue rosuvastatin 10 mg   Hypokalemia: Resolved. -Monitor BMP  Diarrhea, Acute: Improved. No further episodes since yesterday morning.   -Strict I's and O's  Type 2 Diabetes Mellitus: Chronic, stable.  A1c 6.3.  CBG average low 100s.  Has not required any SSI. -Sensitive SSI -CBGs before meals and nightly -Holding home Janumet and Januvia while inpatient (however per PCP docs, Janumet was D/C in 10/2017)  Previous complete heart block with pacemaker in place: Stable.  See chest pain assessment above.  EKG paced rhythm.  -Cardiology on  board, sent message to EP to establish care -Monitor vitals  Sacral ulcer, likely secondary to fungal infection Per wound care, leave dressings off of the affected area and apply antifungal powder every time  patient is turned.  These instructions have been discussed with the patient. -Continue to monitor   CAD s/p PCI:  Chronic, stable See chest pain assessment as above. -Continue home rosuvastatin 10 mg daily -Start Lopressor 150 mg twice daily, from 100 twice daily -Cont aspirin 81 mg for secondary prevention  FEN/GI: Carb modified diet Prophylaxis: Lovenox for decreased creatinine clearance  Disposition:  Continue medical management, will need to meet with palliative care to discuss further goals of care considering progressing dementia with severe renal disease.  Subjective:  William [name] was oriented only to his name this morning.  He did not know that he was in the hospital nor did he know the year.  He was pleasant and conversational with clear errors in his knowledge (he thought that he had visited [employer] during the evening).  Difficult for me to tell if this is a new altered mental status or simply his baseline.  I will defer to my attending physician who is seen him previously.  He had no complaints of pain this morning.  He specifically denied chest pain, stomach pain, headache.   Objective: Temp:  [98.1 F (36.7 C)-98.9 F (37.2 C)] 98.3 F (36.8 C) (12/27 2047) Pulse Rate:  [70-76] 74 (12/27 2047) Resp:  [18-19] 18 (12/27 2047) BP: (156-159)/(86-88) 159/88 (12/27 2047) SpO2:  [100 %] 100 % (12/27 2047) Weight:  [81.3 kg] 81.3 kg (12/27 2047)  Physical exam:  General: Alert and cooperative and appears to be in no acute distress. Oriented only to self. Cardio: S4 present on auscultation. Rhythm is regular.  nontender to palpation. Pulm: Clear to auscultation bilaterally, no crackles, wheezing, or diminished breath sounds. Normal respiratory effort Abdomen: Bowel sounds normal. Abdomen soft and non-tender.  Extremities: No peripheral edema. Warm/ well perfused.  Strong radial pulse. Neuro: Cranial nerves grossly intact, pt able to move all extremities and follow basic  commands (roll to the side).  Laboratory: Recent Labs  Lab 10/05/18 1505 10/06/18 0933 10/07/18 0357  WBC 10.4 11.5* 8.7  HGB 12.6* 12.2* 12.0*  HCT 37.9* 35.2* 36.4*  PLT 287 253 275   Recent Labs  Lab 10/04/18 1700 10/05/18 0625 10/06/18 0219 10/07/18 0357  NA 138 137 137 138  K 5.1 4.0 3.3* 4.7  CL 106 109 112* 111  CO2 16* 16* 13* 19*  BUN 62* 55* 48* 48*  CREATININE 5.26* 4.40* 4.08* 4.08*  CALCIUM 8.9 8.4* 8.1* 8.5*  PROT 7.3  --   --   --   BILITOT 1.3*  --   --   --   ALKPHOS 80  --   --   --   ALT 15  --   --   --   AST 30  --   --   --   GLUCOSE 156* 95 124* 108*   Imaging/Diagnostic Tests: Dg Chest 2 View  Result Date: 10/04/2018 CLINICAL DATA:  Confusion, fatigue, rule out occult infection. Pt only c/o diarrhea to xray staff, stating he's had diarrhea for weeks. Hx of HTN, DM.confusion, fatigue, rule out occult infection EXAM: CHEST - 2 VIEW COMPARISON:  10/18/2017 FINDINGS: LEFT-sided pacemaker overlies normal cardiac silhouette. No effusion, infiltrate, pneumothorax. Degenerative osteophytosis of the spine. IMPRESSION: No acute cardiopulmonary process. Electronically Signed   By: Helane Gunther.D.  On: 10/04/2018 18:31   Ct Head Wo Contrast  Result Date: 10/04/2018 CLINICAL DATA:  fall, AMS, hx of brain surgeryAltered level of consciousness (LOC), unexplained fall, AMS, hx of brain surgery EXAM: CT HEAD WITHOUT CONTRAST TECHNIQUE: Contiguous axial images were obtained from the base of the skull through the vertex without intravenous contrast. COMPARISON:  None. FINDINGS: Brain: No acute intracranial hemorrhage. No focal mass lesion. No CT evidence of acute infarction. No midline shift or mass effect. No hydrocephalus. Basilar cisterns are patent. There are periventricular and subcortical white matter hypodensities. Generalized cortical atrophy. Vascular: No hyperdense vessel or unexpected calcification. Skull: Prior RIGHT craniotomy. Sinuses/Orbits: Orbits  normal. Coastal thickening in the ethmoid sinuses Other: None IMPRESSION: 1. No acute intracranial findings. 2. Atrophy and white matter microvascular disease. Electronically Signed   By: Suzy Bouchard M.D.   On: 10/04/2018 19:56   US Renal  Result Date: 10/05/2018 CLINICAL DATA:  Acute onset of renal insufficiency. EXAM: RENAL / URINARY TRACT ULTRASOUND COMPLETE COMPARISON:  None. FINDINGS: Right Kidney: Renal measurements: 10.6 x 6.0 x 6.0 cm = volume: 202.9 mL. Increased parenchymal echogenicity is noted. A 1.5 cm cyst is noted at the interpole region of the right kidney. No hydronephrosis visualized. Left Kidney: Renal measurements: 9.6 x 4.9 x 5.2 cm = volume: 127.3 mL. Increased parenchymal echogenicity is noted. Cysts are noted at the lower pole and upper pole of the left kidney, measuring 3.2 cm and 1.5 cm in size. The larger cyst has a septation and question of a peripheral soft tissue component. No hydronephrosis visualized. Bladder: Appears normal for degree of bladder distention. IMPRESSION: 1. No evidence of hydronephrosis. 2. Increased renal parenchymal echogenicity raises concern for medical renal disease. 3. Bilateral renal cysts. A somewhat complex 3.2 cm cystic lesion at the lower pole of the left kidney has a septation and question of a peripheral soft tissue component. Contrast-enhanced CT or MRI would be helpful for further evaluation, on an elective nonemergent basis. Electronically Signed   By: Garald Balding M.D.   On: 10/05/2018 00:40   Matilde Haymaker, MD 10/08/2018, 4:47 AM PGY-1, [place] Intern pager: [phone], text pages welcome
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
## 2018-10-08 NOTE — Progress Notes (Signed)
[facility] Progress Note  Name: William [name] MRN: [id number] DOB: 27-Sep-1941  Chief Complaint:  ams and diarrhea   Subjective:  Patient has had 1.7 liters UOP charted thus far 12/28.  He has had improvement in renal function with hydration.  Spoke with daughter Rise Paganini via phone.  Per nurse he has been confused and pulling off telemetry.  She states that he had confusion during a hospitalization about 8 months ago in [place] and she feels that she was told that his statin made him more confused.    Review of systems:  Denies shortness of breath or cough  No n/v No chest pain   Intake/Output Summary (Last 24 hours) at 10/08/2018 1541 Last data filed at 10/08/2018 1410 Gross per 24 hour  Intake 395.67 ml  Output 2150 ml  Net -1754.33 ml    Vitals:  Vitals:   10/07/18 1621 10/07/18 2047 10/08/18 0500 10/08/18 0712  BP: (!) 157/87 (!) 159/88 (!) 148/80 (!) 145/79  Pulse: 70 74 75 75  Resp: 19 18 18 19   Temp: 98.9 F (37.2 C) 98.3 F (36.8 C) 98.6 F (37 C) 98 F (36.7 C)  TempSrc: Oral Oral Oral   SpO2: 100% 100% 100% 100%  Weight:  81.3 kg    Height:         Physical Exam:  General adult male in bed in no acute distress HEENT normocephalic atraumatic extraocular movements intact sclera anicteric Neck supple trachea midline Lungs clear to auscultation bilaterally normal work of breathing at rest  Heart regular rate and rhythm no rubs or gallops appreciated Abdomen soft nontender nondistended Extremities no edema  Psych normal mood and affect Neuro - alert and oriented to year and person but not location.  conversant   Medications reviewed   Labs:  BMP Latest Ref Rng & Units 10/08/2018 10/07/2018 10/06/2018  Glucose 70 - 99 mg/dL 96 108(H) 124(H)  BUN 8 - 23 mg/dL 40(H) 48(H) 48(H)  Creatinine 0.61 - 1.24 mg/dL 3.13(H) 4.08(H) 4.08(H)  BUN/Creat Ratio 10 - 24 - - -  Sodium 135 - 145 mmol/L 140 138 137  Potassium 3.5 - 5.1 mmol/L 3.2(L)  4.7 3.3(L)  Chloride 98 - 111 mmol/L 120(H) 111 112(H)  CO2 22 - 32 mmol/L 14(L) 19(L) 13(L)  Calcium 8.9 - 10.3 mg/dL 7.0(L) 8.5(L) 8.1(L)     Assessment/Plan:  # AKI  - Pre-renal component setting of losartan/HCTZ as well as diarrhea; improving with hydration (which was d/c'd 12/26).  UA with 100 mg/dL protein and 0-5 RBC's - Nonoliguric and improving with hydration  - Continue normal saline  - Would hold home losartan and HCTZ  - Send up/cr ratio (ordered)  # CKD stage IV  - Secondary in part to DM and HTN - baseline Cr may be near 2.3 - 2.8 per 10/2017 data  - [facility] records - requesting hospital records as office will be closed over the weekend (not yet received)  # HTN with CKD  - Acceptable  - Holding losartan and HCTZ as above  - Continue current regimen for now   # Diarrhea  - resolving - Continue supportive care   # Metabolic acidosis - 2/2 AoCKD as well as diarrhea; hydration - Continue supportive care - Will add supplemental bicarbonate for now   # Renal cyst - complex  - Non-emergent follow-up imaging needed   # AMS  - Note daughter is pursuing POA status as he has had a decline in his functional status  and 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

## 2018-10-08 NOTE — Progress Notes (Signed)
10/08/18:  CSW provided bed offers to patient's daughter [name]. Patient has been accepted to [facility], [facility], [facility], [facility], and [facility].   Patient's daughter will do research and make a decision by Sunday.   CSW will continue to follow and get a facility placement once the family has had time to make a decision.   Domenic Schwab, MSW, [place]

## 2018-10-09 DIAGNOSIS — N185 Chronic kidney disease, stage 5: Secondary | ICD-10-CM

## 2018-10-09 DIAGNOSIS — I10 Essential (primary) hypertension: Secondary | ICD-10-CM

## 2018-10-09 DIAGNOSIS — N184 Chronic kidney disease, stage 4 (severe): Secondary | ICD-10-CM

## 2018-10-09 DIAGNOSIS — I1311 Hypertensive heart and chronic kidney disease without heart failure, with stage 5 chronic kidney disease, or end stage renal disease: Secondary | ICD-10-CM | POA: Diagnosis present

## 2018-10-09 LAB — CBC
HCT: 39.5 % (ref 39.0–52.0)
Hemoglobin: 13.4 g/dL (ref 13.0–17.0)
MCH: 32.6 pg (ref 26.0–34.0)
MCHC: 33.9 g/dL (ref 30.0–36.0)
MCV: 96.1 fL (ref 80.0–100.0)
Platelets: 315 10*3/uL (ref 150–400)
RBC: 4.11 MIL/uL — ABNORMAL LOW (ref 4.22–5.81)
RDW: 12.9 % (ref 11.5–15.5)
WBC: 9.1 10*3/uL (ref 4.0–10.5)
nRBC: 0 % (ref 0.0–0.2)

## 2018-10-09 LAB — BASIC METABOLIC PANEL
Anion gap: 10 (ref 5–15)
BUN: 40 mg/dL — ABNORMAL HIGH (ref 8–23)
CO2: 19 mmol/L — ABNORMAL LOW (ref 22–32)
Calcium: 8.9 mg/dL (ref 8.9–10.3)
Chloride: 110 mmol/L (ref 98–111)
Creatinine, Ser: 3.98 mg/dL — ABNORMAL HIGH (ref 0.61–1.24)
GFR calc Af Amer: 16 mL/min — ABNORMAL LOW (ref >60)
GFR calc non Af Amer: 14 mL/min — ABNORMAL LOW (ref >60)
Glucose, Bld: 105 mg/dL — ABNORMAL HIGH (ref 70–99)
Potassium: 4.5 mmol/L (ref 3.5–5.1)
Sodium: 139 mmol/L (ref 135–145)

## 2018-10-09 LAB — FOLATE RBC
Folate, Hemolysate: 421.3 ng/mL
Folate, RBC: 1190 ng/mL (ref >498)
Hematocrit: 35.4 % — ABNORMAL LOW (ref 37.5–51.0)

## 2018-10-09 LAB — GLUCOSE, CAPILLARY
Glucose-Capillary: 121 mg/dL — ABNORMAL HIGH (ref 70–99)
Glucose-Capillary: 134 mg/dL — ABNORMAL HIGH (ref 70–99)
Glucose-Capillary: 127 mg/dL — ABNORMAL HIGH (ref 70–99)

## 2018-10-09 LAB — MAGNESIUM: Magnesium: 1.8 mg/dL (ref 1.7–2.4)

## 2018-10-09 MED ORDER — HYDRALAZINE HCL 10 MG PO TABS: 10.0000 mg | ORAL_TABLET | Freq: Three times a day (TID) | ORAL | Status: DC

## 2018-10-09 MED ORDER — HYDRALAZINE HCL 25 MG PO TABS
25.0000 mg | ORAL_TABLET | Freq: Three times a day (TID) | ORAL | Status: DC
  Administered 2018-10-09 – 2018-10-10 (×3): 25 mg via ORAL
  Filled 2018-10-09 (×3): qty 1

## 2018-10-09 NOTE — Progress Notes (Signed)
Bladder scan done,109 ml of residual urine detected.He has condom catheter connected to foley drain bag with 400 ml urine.

## 2018-10-09 NOTE — Progress Notes (Signed)
Patient had a 16 beat run of Vtach. Patient denies any chest pain. Text paged MD on call.

## 2018-10-09 NOTE — Progress Notes (Signed)
At 8:30 PM a page was received about William [name] attempting to leave AMA.  The family medicine team went to his room to discuss why he wanted to leave.  At that time, he was alert and oriented to self and to year but he did not know the month or that he was in the hospital.  He stated that he wanted to leave because it was his right.  He was not able to explain why he was in the hospital or the risks associated with leaving.  He agreed to wait for his daughters to come to speak about leaving the hospital.  At 9:20 PM, two of William [name] daughters arrived, William [name] and William [name].  Both of the daughters understood the importance of William [name] staying in the hospital and the risks associated with him leaving.  William [name] talked with William [name] and helped him understand why he should stay in the hospital.  William [name] agreed to stay in the hospital under because no more disturbance overnight.  The daughters also agreed that his mental status at that time was significantly worsened than he had been at home.  They acknowledged that he did have some baseline dementia but it seems to be acutely worsened now. Both of the daughters left their contact information.  Since the daughters left, William [name] has not attempted to leave although he has continued to pull at his lines, pull off his condom catheter, pull off his cardiac monitor.  He is now wearing mittens to help prevent him pulling at medical equipment.  No restraints physical or chemical were used during the night.

## 2018-10-09 NOTE — Progress Notes (Signed)
CSW called and spoke with the patients daughter. She has not had a chance to pick a skilled nursing facility. Would like to be given to at least Monday afternoon to make a decision.   CSW will continue to follow.   Domenic Schwab, MSW, [place]

## 2018-10-09 NOTE — Progress Notes (Signed)
Family Medicine Teaching Service Daily Progress Note Intern Pager: [phone]  Patient name: William [name] Medical record number: [id number] Date of birth: [birth year] Age: 77 y.o. Gender: male  Primary Care Provider: Patient, No Pcp Per Consultants: None Code Status: DNR  Pt Overview and Major Events to Date:  10/04/2018-patient admitted for headache, fall  Assessment and Plan: William [name] is a 77 year old male presenting after a fall with a headache, found to have altered mental status, AKI, and hypertensive urgency.  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  AMS likely in setting of underlying dementia:   This is my first day interacting with the patient and report from overnight and yesterday afternoon was that he was very agitated and refusing medications as well as pulling off monitors and IVs.  This morning, the patient was pleasant, was alert and oriented to person,place, time (although he admitted that he read it off the calendar on the wall).  Daughter states she has noticed subtle signs of decline over the past few months.   - MOCA -Follow-up with social work on SNF placement  - lunch time labs (potentially best mental status at lunch if he is sundowning)  AKI on CKD stage IV:   5.26 on admit, apparent baseline appears to be around 2.2, nephrology suspects dehydration in the setting of diarrhea and continued use of HCTZ and losartan.  Cr increased to 3.98 today. Patient has been pulling out IV lines which is likely contributing to dehydration. Protein:creatinine ratio is elevated at 5.44. 1100 input/2350 output yesterday. His IV line was in at 1ml/hr.   -Nephrology following -Hold losartan and HCTZ -Light hydration with NS at 75 mLs/hr,  - Avoid nephrotoxic medications as possible, holding home losartan and HCTZ - Strict I&O  Non-sustained ventricular tachycardia:  Recurrent. No new events since 12/25.  Thought to be due to electrolyte imbalance. -Monitor on telemetry -Continue metoprolol 150 mg twice daily  Hypertensive Urgency, in setting of Chronic Hypertension: Acute, improving.  SBP 175-193, DBP 93-102 in the past 24 hours. Patient has not been getting his hydralazine/labetalol over the past two days, partly due to refusing medications.  .  Echo 12/25 EF 65-70%, G1 DD, and pattern of severe LVH.  -Continue metoprolol 150 mg twice daily -Continue amlodipine 10 mg -Hydralazine 10 mg every 4 PRN SBP >180, DBP >100  -Holding home losartan 100mg  and HCTZ 25mg  in the setting of AKI -Monitor BP   Chest pain: Resolved. Cardiology noted that his elevated troponins initially are likely related to his renal insufficiency.  There is minimal concern for his nonsustained V. tach.  Cardiology she has signed off and plan to have him follow-up outpatient for continued pacer care. -Cardiology signed off -Continue aspirin 81 mg -Continue rosuvastatin 10 mg   Hypokalemia: Resolved. -Monitor BMP  Diarrhea, Acute: Improved. No further episodes since yesterday morning.   -Strict I's and O's  Type 2 Diabetes Mellitus: Chronic, stable.  A1c 6.3.  CBG average low 100s.  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  See chest pain assessment above.  EKG paced rhythm.  -Cardiology on board, sent message to EP to establish care -Monitor vitals  Sacral ulcer, likely secondary to fungal infection Per wound care, leave dressings  off of the affected area and apply antifungal powder every time patient is turned.  These instructions have been discussed with the patient. -Continue to monitor   CAD s/p PCI:  Chronic, stable See chest pain assessment as above. -Continue home  rosuvastatin 10 mg daily -Start Lopressor 150 mg twice daily, from 100 twice daily -Cont aspirin 81 mg for secondary prevention  FEN/GI: Carb modified diet Prophylaxis: Lovenox for decreased creatinine clearance  Disposition:  Continue medical management, will need to meet with palliative care to discuss further goals of care considering progressing dementia with severe renal disease.  Subjective:  William [name] stated he is feeling great and had a good night last night.  He does not remember pulling off any IV lines or monitors.  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  He states it's December.    Per his daughter he has not been himself since coming to the hospital but that looking back there were signs that his mental status was declining such as getting lost while driving, forgettting what he was talking about mid sentence, and texting people at 2 AM.    Objective: Temp:  [98 F (36.7 C)-98.4 F (36.9 C)] 98.4 F (36.9 C) (12/29 0354) Pulse Rate:  [68-81] 76 (12/29 0733) Resp:  [18-21] 18 (12/29 0733) BP: (155-193)/(80-102) 188/93 (12/29 0733) SpO2:  [98 %-100 %] 98 % (12/29 0733) Weight:  [82.1 kg] 82.1 kg (12/28 2115)  Physical exam:  General: Alert and cooperative and appears to be in no acute distress. Oriented to person, place, time.  Cardio:normal rate. [name] Rhythm is regular.  nontender to palpation. Pulm: Clear to auscultation bilaterally, no crackles, wheezing, or diminished breath sounds. Normal respiratory effort Abdomen: Bowel sounds normal. Abdomen soft and non-tender.  Extremities: No peripheral edema. Warm/ well perfused.   Neuro: Cranial nerves grossly intact,   Laboratory: Recent Labs  Lab 10/07/18 0357 10/08/18 1101 10/09/18 0604  WBC 8.7 6.0 9.1  HGB 12.0* 10.9* 13.4  HCT 36.4* 33.2* 39.5  PLT 275 243 315   Recent Labs  Lab 10/04/18 1700  10/07/18 0357 10/08/18 1101  10/09/18 0604  NA 138   < > 138 140 139  K 5.1   < > 4.7 3.2* 4.5  CL 106   < > 111 120* 110  CO2 16*   < > 19* 14* 19*  BUN 62*   < > 48* 40* 40*  CREATININE 5.26*   < > 4.08* 3.13* 3.98*  CALCIUM 8.9   < > 8.5* 7.0* 8.9  PROT 7.3  --   --   --   --   BILITOT 1.3*  --   --   --   --   ALKPHOS 80  --   --   --   --   ALT 15  --   --   --   --   AST 30  --   --   --   --   GLUCOSE 156*   < > 108* 96 105*   < > = values in this interval not displayed.   Imaging/Diagnostic Tests: Dg Chest 2 View  Result Date: 10/04/2018 CLINICAL DATA:  Confusion, fatigue, rule out occult infection. Pt only c/o diarrhea to xray staff, stating he's had diarrhea for weeks. Hx of HTN, DM.confusion, fatigue, rule out occult infection EXAM: CHEST - 2 VIEW COMPARISON:  10/18/2017 FINDINGS:  LEFT-sided pacemaker overlies normal cardiac silhouette. No effusion, infiltrate, pneumothorax. Degenerative osteophytosis of the spine. IMPRESSION: No acute cardiopulmonary process. Electronically Signed   By: Suzy Bouchard M.D.   On: 10/04/2018 18:31   Ct Head Wo Contrast  Result Date: 10/04/2018 CLINICAL DATA:  fall, AMS, hx of brain surgeryAltered level of consciousness (LOC), unexplained fall, AMS, hx of brain surgery EXAM: CT HEAD WITHOUT CONTRAST TECHNIQUE: Contiguous axial images were obtained from the base of the skull through the vertex without intravenous contrast. COMPARISON:  None. FINDINGS: Brain: No acute intracranial hemorrhage. No focal mass lesion. No CT evidence of acute infarction. No midline shift or mass effect. No hydrocephalus. Basilar cisterns are patent. There are periventricular and subcortical white matter hypodensities. Generalized cortical atrophy. Vascular: No hyperdense vessel or unexpected calcification. Skull: Prior RIGHT craniotomy. Sinuses/Orbits: Orbits normal. Coastal thickening in the ethmoid sinuses Other: None IMPRESSION: 1. No acute intracranial findings. 2. Atrophy and white matter  microvascular disease. Electronically Signed   By: Suzy Bouchard M.D.   On: 10/04/2018 19:56   US Renal  Result Date: 10/05/2018 CLINICAL DATA:  Acute onset of renal insufficiency. EXAM: RENAL / URINARY TRACT ULTRASOUND COMPLETE COMPARISON:  None. FINDINGS: Right Kidney: Renal measurements: 10.6 x 6.0 x 6.0 cm = volume: 202.9 mL. Increased parenchymal echogenicity is noted. A 1.5 cm cyst is noted at the interpole region of the right kidney. No hydronephrosis visualized. Left Kidney: Renal measurements: 9.6 x 4.9 x 5.2 cm = volume: 127.3 mL. Increased parenchymal echogenicity is noted. Cysts are noted at the lower pole and upper pole of the left kidney, measuring 3.2 cm and 1.5 cm in size. The larger cyst has a septation and question of a peripheral soft tissue component. No hydronephrosis visualized. Bladder: Appears normal for degree of bladder distention. IMPRESSION: 1. No evidence of hydronephrosis. 2. Increased renal parenchymal echogenicity raises concern for medical renal disease. 3. Bilateral renal cysts. A somewhat complex 3.2 cm cystic lesion at the lower pole of the left kidney has a septation and question of a peripheral soft tissue component. Contrast-enhanced CT or MRI would be helpful for further evaluation, on an elective nonemergent basis. Electronically Signed   By: Garald Balding M.D.   On: 10/05/2018 00:40   Benay Pike, MD 10/09/2018, 8:18 AM PGY-1, [place] Intern pager: [phone], text pages welcome

## 2018-10-09 NOTE — Progress Notes (Signed)
Progress Note  Patient Name: William [name] Date of Encounter: 10/09/2018  Primary Cardiologist: New to [place]   Subjective   77 year old gentleman with a history of coronary artery disease-status post PCI, hyperlipidemia, history of heart block with pacemaker implantation. He presented to the hospital yesterday with mental status changes and having fallen. Troponin levels were minimally elevated.  He has acute renal failure. Echocardiogram from October 05, 2018 reveals vigorous left ventricular systolic function with an ejection fraction of 65 to 70%.  He has grade 1 diastolic dysfunction.  He has mild aortic stenosis.  He presented with some atypical chest pain.  Chest pain was not relieved with sublingual nitroglycerin.  It was more likely due to gastroesophageal reflux.   Inpatient Medications    Scheduled Meds: . amLODipine  10 mg Oral Daily  . aspirin EC  81 mg Oral Daily  . calcium carbonate  1 tablet Oral Once  . enoxaparin (LOVENOX) injection  30 mg Subcutaneous Q24H  . feeding supplement (ENSURE ENLIVE)  237 mL Oral BID BM  . insulin aspart  0-9 Units Subcutaneous TID WC  . metoprolol tartrate  150 mg Oral BID  . pantoprazole  20 mg Oral Daily  . sodium bicarbonate  650 mg Oral BID   Continuous Infusions: . sodium chloride 75 mL/hr at 10/08/18 0850   PRN Meds: acetaminophen **OR** acetaminophen, hydrALAZINE, labetalol, prochlorperazine   Vital Signs    Vitals:   10/08/18 1555 10/08/18 2115 10/09/18 0354 10/09/18 0733  BP: (!) 155/80 (!) 175/102 (!) 193/99 (!) 188/93  Pulse: 81 71 68 76  Resp: (!) 21 18 (!) 21 18  Temp: 98.2 F (36.8 C) 98 F (36.7 C) 98.4 F (36.9 C) 98.5 F (36.9 C)  TempSrc: Oral Oral Oral Oral  SpO2: 100% 99% 99% 98%  Weight:  82.1 kg    Height:        Intake/Output Summary (Last 24 hours) at 10/09/2018 1015 Last data filed at 10/09/2018 0600 Gross per 24 hour  Intake 1120 ml  Output 1450 ml  Net -330 ml   Filed  Weights   10/07/18 0422 10/07/18 2047 10/08/18 2115  Weight: 81.3 kg 81.3 kg 82.1 kg    Telemetry  V pacing ,  Had a 30+ beat run of NSVT  - Personally Reviewed  ECG      - Personally Reviewed  Physical Exam    GEN: No acute distress.   Neck: JVP is normal   Cardiac: RRR, no murmurs, rubs, or gallops.  Respiratory: Clear to auscultation bilaterally. GI: Soft, nontender, non-distended  MS: No edema; No deformity.   Labs    Chemistry Recent Labs  Lab 10/04/18 1700  10/07/18 0357 10/08/18 1101 10/09/18 0604  NA 138   < > 138 140 139  K 5.1   < > 4.7 3.2* 4.5  CL 106   < > 111 120* 110  CO2 16*   < > 19* 14* 19*  GLUCOSE 156*   < > 108* 96 105*  BUN 62*   < > 48* 40* 40*  CREATININE 5.26*   < > 4.08* 3.13* 3.98*  CALCIUM 8.9   < > 8.5* 7.0* 8.9  PROT 7.3  --   --   --   --   ALBUMIN 3.6  --   --   --   --   AST 30  --   --   --   --   ALT 15  --   --   --   --  ALKPHOS 80  --   --   --   --   BILITOT 1.3*  --   --   --   --   GFRNONAA 10*   < > 13* 18* 14*  GFRAA 11*   < > 15* 21* 16*  ANIONGAP 16*   < > 8 6 10    < > = values in this interval not displayed.     Hematology Recent Labs  Lab 10/07/18 0357 10/08/18 1101 10/09/18 0604  WBC 8.7 6.0 9.1  RBC 3.78* 3.43* 4.11*  HGB 12.0* 10.9* 13.4  HCT 36.4* 33.2* 39.5  MCV 96.3 96.8 96.1  MCH 31.7 31.8 32.6  MCHC 33.0 32.8 33.9  RDW 12.9 12.7 12.9  PLT 275 243 315    Cardiac Enzymes Recent Labs  Lab 10/06/18 0219 10/06/18 0706 10/06/18 1836  TROPONINI 0.10* 0.05* 0.06*    Recent Labs  Lab 10/04/18 1725  TROPIPOC 0.03     BNPNo results for input(s): BNP, PROBNP in the last 168 hours.   DDimer No results for input(s): DDIMER in the last 168 hours.   Radiology    No results found.  Cardiac Studies      Patient Profile     77 y.o. male with a history of coronary artery disease.  He presents with acute renal failure, mental status changes and weakness.  He was found to have a minimally  elevated troponin level.  Assessment & Plan    1.  Elevated troponin level: His elevated troponin level is most likely related to his acute renal insufficiency. There is no evidence of any acute coronary syndrome No further work up planned for now    2  HTN  BP has been very high today   Will add hydralazine 10 tid   Follow   COntinue other meds     3.  Renal   Cr 3.98  Getting IV fluids now     4.  Nonsustained ventricular tachycardia SHort salvos of NSVT   Normal LVEF  Continue b blocker    For questions or updates, please contact [place] Please consult [email or web address] for contact info under        Signed, Dorris Carnes, MD  10/09/2018, 10:15 AM

## 2018-10-09 NOTE — Progress Notes (Addendum)
[facility] Progress Note  Name: William [name] MRN: [id number] DOB: [birth year]  Chief Complaint:  ams and diarrhea   Subjective:  Patient had 2.4 liters UOP over 12/28.  He has been on NS at 75/hr per nursing (about to change the bag).  Spoke with RN and daughter.  Patient was agitated yesterday and pulled out IV and took off condom cath.  His daughter asks about natural medicine for him such as apple cider vinegar and we discussed that I would not recommend starting that.  Review of systems:   Denies shortness of breath or cough  No n/v No chest pain   Intake/Output Summary (Last 24 hours) at 10/09/2018 1237 Last data filed at 10/09/2018 1035 Gross per 24 hour  Intake 1240 ml  Output 1350 ml  Net -110 ml    Vitals:  Vitals:   10/08/18 1555 10/08/18 2115 10/09/18 0354 10/09/18 0733  BP: (!) 155/80 (!) 175/102 (!) 193/99 (!) 188/93  Pulse: 81 71 68 76  Resp: (!) 21 18 (!) 21 18  Temp: 98.2 F (36.8 C) 98 F (36.7 C) 98.4 F (36.9 C) 98.5 F (36.9 C)  TempSrc: Oral Oral Oral Oral  SpO2: 100% 99% 99% 98%  Weight:  82.1 kg    Height:         Physical Exam:  General adult male in bed in no acute distress HEENT normocephalic atraumatic extraocular movements intact sclera anicteric Neck supple trachea midline Lungs clear to auscultation bilaterally normal work of breathing at rest  Heart regular rate and rhythm no rubs or gallops appreciated Abdomen soft nontender nondistended Extremities no edema  Psych normal mood and affect    Medications reviewed   Labs:  BMP Latest Ref Rng & Units 10/09/2018 10/08/2018 10/07/2018  Glucose 70 - 99 mg/dL 105(H) 96 108(H)  BUN 8 - 23 mg/dL 40(H) 40(H) 48(H)  Creatinine 0.61 - 1.24 mg/dL 3.98(H) 3.13(H) 4.08(H)  BUN/Creat Ratio 10 - 24 - - -  Sodium 135 - 145 mmol/L 139 140 138  Potassium 3.5 - 5.1 mmol/L 4.5 3.2(L) 4.7  Chloride 98 - 111 mmol/L 110 120(H) 111  CO2 22 - 32 mmol/L 19(L) 14(L) 19(L)   Calcium 8.9 - 10.3 mg/dL 8.9 7.0(L) 8.5(L)     Assessment/Plan:  # AKI  - Pre-renal component setting of losartan/HCTZ as well as diarrhea; improving with hydration (which was d/c'd 12/26).  UA with 100 mg/dL protein and 0-5 RBC's.  Note up/cr ratio with 5440 mg/g protein  - Nonoliguric - Continue normal saline for now - Would hold home losartan and HCTZ  - Noting proteinuria below will send SPEP, UPEP  - Check post-void residual bladder scan to rule out any component of retention  # CKD stage IV  - Secondary in part to DM and HTN - baseline Cr may be near 2.3 - 2.8 per 10/2017 data  - Obtain [place] records - requesting hospital records as office will be closed over the weekend (not yet received)   # HTN with CKD  - Uncontrolled  - Holding losartan and HCTZ with AKI  - Start hydralazine - 25 mg TID and reassess response  # Proteinuria  - Send SPEP, UPEP. 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

## 2018-10-10 DIAGNOSIS — I160 Hypertensive urgency: Secondary | ICD-10-CM

## 2018-10-10 LAB — CBC WITH DIFFERENTIAL/PLATELET
Abs Immature Granulocytes: 0.04 10*3/uL (ref 0.00–0.07)
Basophils Absolute: 0.1 10*3/uL (ref 0.0–0.1)
Basophils Relative: 1 %
Eosinophils Absolute: 0.3 10*3/uL (ref 0.0–0.5)
Eosinophils Relative: 4 %
HCT: 36.8 % — ABNORMAL LOW (ref 39.0–52.0)
Hemoglobin: 12.9 g/dL — ABNORMAL LOW (ref 13.0–17.0)
Immature Granulocytes: 1 %
Lymphocytes Relative: 17 %
Lymphs Abs: 1.2 10*3/uL (ref 0.7–4.0)
MCH: 32.5 pg (ref 26.0–34.0)
MCHC: 35.1 g/dL (ref 30.0–36.0)
MCV: 92.7 fL (ref 80.0–100.0)
Monocytes Absolute: 0.7 10*3/uL (ref 0.1–1.0)
Monocytes Relative: 10 %
Neutro Abs: 5.1 10*3/uL (ref 1.7–7.7)
Neutrophils Relative %: 67 %
Platelets: ADEQUATE 10*3/uL (ref 150–400)
RBC: 3.97 MIL/uL — ABNORMAL LOW (ref 4.22–5.81)
RDW: 12.8 % (ref 11.5–15.5)
WBC: 7.4 10*3/uL (ref 4.0–10.5)
nRBC: 0 % (ref 0.0–0.2)

## 2018-10-10 LAB — GLUCOSE, CAPILLARY
Glucose-Capillary: 85 mg/dL (ref 70–99)
Glucose-Capillary: 78 mg/dL (ref 70–99)
Glucose-Capillary: 164 mg/dL — ABNORMAL HIGH (ref 70–99)
Glucose-Capillary: 103 mg/dL — ABNORMAL HIGH (ref 70–99)
Glucose-Capillary: 256 mg/dL — ABNORMAL HIGH (ref 70–99)

## 2018-10-10 LAB — BASIC METABOLIC PANEL
Anion gap: 8 (ref 5–15)
BUN: 38 mg/dL — ABNORMAL HIGH (ref 8–23)
CO2: 17 mmol/L — ABNORMAL LOW (ref 22–32)
Calcium: 8.4 mg/dL — ABNORMAL LOW (ref 8.9–10.3)
Chloride: 112 mmol/L — ABNORMAL HIGH (ref 98–111)
Creatinine, Ser: 3.94 mg/dL — ABNORMAL HIGH (ref 0.61–1.24)
GFR calc Af Amer: 16 mL/min — ABNORMAL LOW (ref >60)
GFR calc non Af Amer: 14 mL/min — ABNORMAL LOW (ref >60)
Glucose, Bld: 213 mg/dL — ABNORMAL HIGH (ref 70–99)
Potassium: 4.4 mmol/L (ref 3.5–5.1)
Sodium: 137 mmol/L (ref 135–145)

## 2018-10-10 MED ORDER — ROSUVASTATIN CALCIUM 20 MG PO TABS
40.0000 mg | ORAL_TABLET | Freq: Every day | ORAL | Status: DC
  Administered 2018-10-10 – 2018-10-12 (×3): 40 mg via ORAL
  Filled 2018-10-10 (×3): qty 2

## 2018-10-10 MED ORDER — HYDRALAZINE HCL 50 MG PO TABS
50.0000 mg | ORAL_TABLET | Freq: Three times a day (TID) | ORAL | Status: DC
  Administered 2018-10-10 – 2018-10-11 (×3): 50 mg via ORAL
  Filled 2018-10-10 (×3): qty 1

## 2018-10-10 NOTE — Progress Notes (Signed)
Physical Therapy Plan of Care Update   10/10/18 1300  PT Visit Information  Last PT Received On 10/10/18   PT - Assessment/Plan  PT Plan Frequency needs to be updated  PT Frequency (ACUTE ONLY) Min 2X/week  Follow Up Recommendations SNF;Supervision for mobility/OOB   Note that plan is for continued rehab at the SNF level. Acute PT frequency adjusted to reflect this in plan of care. Will continue to follow.   Rolinda Roan, PT, DPT Acute Rehabilitation Services Pager: [phone] Office: [phone]

## 2018-10-10 NOTE — Progress Notes (Signed)
[facility] Progress Note  Name: William [name] MRN: [id number] DOB: December 15, 1940  Chief Complaint:  ams and diarrhea   Subjective:  No labs are available for review and see per charting these were moved to lunch time per team possibly given agitation.  He had 2.9 L UOP over 12/29 and has been back on IV fluids.  Review of systems: Denies shortness of breath or cough  No n/v No chest pain   Intake/Output Summary (Last 24 hours) at 10/10/2018 0745 Last data filed at 10/10/2018 0600 Gross per 24 hour  Intake 1244.95 ml  Output 2900 ml  Net -1655.05 ml    Vitals:  Vitals:   10/09/18 0733 10/09/18 1613 10/09/18 2135 10/10/18 0406  BP: (!) 188/93 (!) 157/130 (!) 198/92 (!) 175/80  Pulse: 76 84 77 67  Resp: 18 17 19 19   Temp: 98.5 F (36.9 C) 98.4 F (36.9 C) 98.4 F (36.9 C) 98.2 F (36.8 C)  TempSrc: Oral Oral Oral Oral  SpO2: 98% 100% 100% 100%  Weight:   82.1 kg   Height:         Physical Exam:  General adult male in bed in no acute distress HEENT normocephalic atraumatic extraocular movements intact sclera anicteric Neck supple trachea midline Lungs clear to auscultation bilaterally normal work of breathing at rest  Heart regular rate and rhythm no rubs or gallops appreciated Abdomen soft nontender nondistended Extremities no edema  Psych normal mood and affect   Medications reviewed   Labs:  BMP Latest Ref Rng & Units 10/09/2018 10/08/2018 10/07/2018  Glucose 70 - 99 mg/dL 105(H) 96 108(H)  BUN 8 - 23 mg/dL 40(H) 40(H) 48(H)  Creatinine 0.61 - 1.24 mg/dL 3.98(H) 3.13(H) 4.08(H)  BUN/Creat Ratio 10 - 24 - - -  Sodium 135 - 145 mmol/L 139 140 138  Potassium 3.5 - 5.1 mmol/L 4.5 3.2(L) 4.7  Chloride 98 - 111 mmol/L 110 120(H) 111  CO2 22 - 32 mmol/L 19(L) 14(L) 19(L)  Calcium 8.9 - 10.3 mg/dL 8.9 7.0(L) 8.5(L)     Assessment/Plan:  # AKI  - Pre-renal component setting of losartan/HCTZ as well as diarrhea; improving with hydration  (which was d/c'd 12/26).  UA with 100 mg/dL protein and 0-5 RBC's.  Note up/cr ratio with 5440 mg/g protein.  Post void residual is acceptable  - Nonoliguric - BMP is ordered  - Continue normal saline for now - Would hold home losartan and HCTZ  - Noting proteinuria below - sent SPEP; UPEP is still outstanding/hasn't been collected  # CKD stage IV  - Secondary in part to DM and HTN - baseline Cr may be near 2.3 - 2.8 per 10/2017 data  - [facility] records - re-submitted request for records from nephrologist there  # HTN with CKD  - Uncontrolled  - Holding losartan and HCTZ with AKI  - On hydralazine (titrate) and metoprolol   # Proteinuria  - SPEP and UPEP ordered. HbA1c 6.5 - Likely 2/2 diabetes   # Diarrhea  - resolving - Continue supportive care   # Metabolic acidosis - 2/2 AoCKD as well as diarrhea; hydration - improving - Continue supportive care - Continue supplemental bicarbonate   # Renal cyst - complex  - Non-emergent follow-up imaging needed   # AMS  - Note daughter is pursuing POA status as he has had a decline in his functional status and higher level skills such as bill paying - Multifactorial  - Discontinued statin per family request  Claudia Desanctis, MD 10/10/2018 7:45 AM

## 2018-10-10 NOTE — Care Management Important Message (Signed)
Important Message  Patient Details  Name: William [name] MRN: [id number] Date of Birth: Nov 15, 1940   Medicare Important Message Given:  Yes    [name] 10/10/2018, 4:17 PM

## 2018-10-10 NOTE — Progress Notes (Signed)
Family Medicine Teaching Service Daily Progress Note Intern Pager: [phone]  Patient name: William [name] Medical record number: [id number] Date of birth: [birth year] Age: 77 y.o. Gender: male  Primary Care Provider: Patient, No Pcp Per Consultants: None Code Status: DNR  Pt Overview and Major Events to Date:  10/04/2018-patient admitted for headache, fall  Assessment and Plan: Demaurion Dicioccio is a 77 year old male presenting after a fall with a headache, found to have altered mental status, AKI, and hypertensive urgency.  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  AMS likely 2/2 progressing dementia:   This is my first day interacting with the patient and report from overnight and yesterday afternoon was that he was very agitated and refusing medications as well as pulling off monitors and IVs.  This morning, the patient was pleasant, was alert and oriented to person,place, time (although he admitted that he read it off the calendar on the wall).  Daughter states she has noticed subtle signs of decline over the past few months.   -MOCA -Follow-up with social work on SNF placement  - lunch time labs (potentially best mental status at lunch if he is sundowning)  AKI on CKD stage IV:   5.26 on admit, apparent baseline appears to be around 2.2. Nephrology suspects dehydration in the setting of diarrhea and continued use of HCTZ and losartan.  Protein:creatinine ratio is elevated at 5.44. Cr: Nephrology ordered serum and urine protein electrophoresis.  5.26>4.4>4.08>4.08>3.13>3.98(12/29).  -Nephrology following -Hold losartan and HCTZ -Light hydration with NS at 75 mLs/hr,  - Avoid nephrotoxic medications as possible, holding home losartan and HCTZ - Strict I&O  Non-sustained ventricular tachycardia: Recurrent. Recurrent nonsustained runs of V. tach.  Cardiology recommending  beta-blocker, no further intervention. -Monitor on telemetry -Continue metoprolol 150 mg twice daily  Hypertensive Urgency, in setting of Chronic Hypertension SBP 1 57-198, DBP 80-130 in the past 24 hours (12/30) -Continue metoprolol 150 mg twice daily -Continue amlodipine 10 mg -Hydralazine 10 mg every 4 PRN SBP >200, DBP >110  -Holding home losartan 100mg  and HCTZ 25mg  in the setting of AKI  Chest pain: Resolved. Cardiology noted that his elevated troponins initially are likely related to his renal insufficiency.  There is minimal concern for his nonsustained V. tach.  Cardiology she has signed off and plan to have him follow-up outpatient for continued pacer care. -Continue aspirin 81 mg -Continue rosuvastatin 10 mg   Hypokalemia: Resolved. -Monitor BMP  Diarrhea, Acute: Improved. No further episodes since yesterday morning.   -Strict I's and O's  Type 2 Diabetes Mellitus: Chronic, stable.  A1c 6.3.  CBG average low 100s.  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  See chest pain assessment above.  EKG paced rhythm.  -Cardiology on board, sent message to EP to establish care -Monitor vitals  Sacral ulcer, likely secondary to fungal infection Per wound care, leave dressings off of the affected area and apply antifungal powder every time patient is turned.  These instructions have been discussed with the patient. -Continue to monitor   CAD s/p PCI:  Chronic, stable See chest pain assessment as above. -Continue home rosuvastatin 10 mg daily -Start Lopressor 150 mg twice daily, from 100 twice daily -Cont aspirin 81 mg for secondary  prevention  FEN/GI: Carb modified diet Prophylaxis: Lovenox for decreased creatinine clearance  Disposition:  Continue medical management, will need to  meet with palliative care to discuss further goals of care considering progressing dementia with severe renal disease.  Subjective:  He is alert and oriented to self and year this morning.  He was still unaware that he was in the hospital and imply that he thought he was on the [facility].  He had no new complaints this morning.  He denied chest pain, stomach pain, nausea.   Objective: Temp:  [98.2 F (36.8 C)-98.5 F (36.9 C)] 98.2 F (36.8 C) (12/30 0406) Pulse Rate:  [67-84] 67 (12/30 0406) Resp:  [17-19] 19 (12/30 0406) BP: (157-198)/(80-130) 175/80 (12/30 0406) SpO2:  [98 %-100 %] 100 % (12/30 0406) Weight:  [82.1 kg] 82.1 kg (12/29 2135)  Physical exam:  General: Pleasant conversational.  Alert and oriented to self and year.  Clear gaps existed in his knowledge during our conversation about his current situation. Cardio: S4 present, 2/6 systolic murmur. Rhythm is regular.  Pulm: Clear to auscultation bilaterally, no crackles, wheezing, or diminished breath sounds. Normal respiratory effort Abdomen: Bowel sounds normal. Abdomen soft and non-tender.  Extremities: No peripheral edema. Warm/ well perfused.  Strong radial pulses. Neuro: Cranial nerves grossly intact  Laboratory: Recent Labs  Lab 10/07/18 0357 10/08/18 1101 10/09/18 0604  WBC 8.7 6.0 9.1  HGB 12.0* 10.9* 13.4  HCT 36.4*  35.4* 33.2* 39.5  PLT 275 243 315   Recent Labs  Lab 10/04/18 1700  10/07/18 0357 10/08/18 1101 10/09/18 0604  NA 138   < > 138 140 139  K 5.1   < > 4.7 3.2* 4.5  CL 106   < > 111 120* 110  CO2 16*   < > 19* 14* 19*  BUN 62*   < > 48* 40* 40*  CREATININE 5.26*   < > 4.08* 3.13* 3.98*  CALCIUM 8.9   < > 8.5* 7.0* 8.9  PROT 7.3  --   --   --   --   BILITOT 1.3*  --   --   --   --   ALKPHOS 80  --   --   --   --   ALT 15  --   --   --   --   AST 30  --   --   --   --   GLUCOSE 156*   < > 108* 96 105*   < > = values in this interval not displayed.   Imaging/Diagnostic Tests: Dg  Chest 2 View  Result Date: 10/04/2018 CLINICAL DATA:  Confusion, fatigue, rule out occult infection. Pt only c/o diarrhea to xray staff, stating he's had diarrhea for weeks. Hx of HTN, DM.confusion, fatigue, rule out occult infection EXAM: CHEST - 2 VIEW COMPARISON:  10/18/2017 FINDINGS: LEFT-sided pacemaker overlies normal cardiac silhouette. No effusion, infiltrate, pneumothorax. Degenerative osteophytosis of the spine. IMPRESSION: No acute cardiopulmonary process. Electronically Signed   By: Suzy Bouchard M.D.   On: 10/04/2018 18:31   Ct Head Wo Contrast  Result Date: 10/04/2018 CLINICAL DATA:  fall, AMS, hx of brain surgeryAltered level of consciousness (LOC), unexplained fall, AMS, hx of brain surgery EXAM: CT HEAD WITHOUT CONTRAST TECHNIQUE: Contiguous axial images were obtained from the base of the skull through the vertex without intravenous contrast. COMPARISON:  None. FINDINGS: Brain: No acute intracranial hemorrhage. No focal mass lesion. No CT evidence of acute infarction. No midline shift  or mass effect. No hydrocephalus. Basilar cisterns are patent. There are periventricular and subcortical white matter hypodensities. Generalized cortical atrophy. Vascular: No hyperdense vessel or unexpected calcification. Skull: Prior RIGHT craniotomy. Sinuses/Orbits: Orbits normal. Coastal thickening in the ethmoid sinuses Other: None IMPRESSION: 1. No acute intracranial findings. 2. Atrophy and white matter microvascular disease. Electronically Signed   By: Suzy Bouchard M.D.   On: 10/04/2018 19:56   US Renal  Result Date: 10/05/2018 CLINICAL DATA:  Acute onset of renal insufficiency. EXAM: RENAL / URINARY TRACT ULTRASOUND COMPLETE COMPARISON:  None. FINDINGS: Right Kidney: Renal measurements: 10.6 x 6.0 x 6.0 cm = volume: 202.9 mL. Increased parenchymal echogenicity is noted. A 1.5 cm cyst is noted at the interpole region of the right kidney. No hydronephrosis visualized. Left Kidney: Renal  measurements: 9.6 x 4.9 x 5.2 cm = volume: 127.3 mL. Increased parenchymal echogenicity is noted. Cysts are noted at the lower pole and upper pole of the left kidney, measuring 3.2 cm and 1.5 cm in size. The larger cyst has a septation and question of a peripheral soft tissue component. No hydronephrosis visualized. Bladder: Appears normal for degree of bladder distention. IMPRESSION: 1. No evidence of hydronephrosis. 2. Increased renal parenchymal echogenicity raises concern for medical renal disease. 3. Bilateral renal cysts. A somewhat complex 3.2 cm cystic lesion at the lower pole of the left kidney has a septation and question of a peripheral soft tissue component. Contrast-enhanced CT or MRI would be helpful for further evaluation, on an elective nonemergent basis. Electronically Signed   By: Garald Balding M.D.   On: 10/05/2018 00:40   Matilde Haymaker, MD 10/10/2018, 6:14 AM PGY-1, [place] Intern pager: [phone], text pages welcome

## 2018-10-10 NOTE — Progress Notes (Signed)
Progress Note  Patient Name: William [name] Date of Encounter: 10/10/2018  Primary Cardiologist: New- [name]  Subjective   Pt doing well today. Does not appear confused this morning.  Denies chest pain.  Inpatient Medications    Scheduled Meds: . amLODipine  10 mg Oral Daily  . aspirin EC  81 mg Oral Daily  . calcium carbonate  1 tablet Oral Once  . enoxaparin (LOVENOX) injection  30 mg Subcutaneous Q24H  . feeding supplement (ENSURE ENLIVE)  237 mL Oral BID BM  . hydrALAZINE  50 mg Oral Q8H  . insulin aspart  0-9 Units Subcutaneous TID WC  . metoprolol tartrate  150 mg Oral BID  . pantoprazole  20 mg Oral Daily  . sodium bicarbonate  650 mg Oral BID   Continuous Infusions: . sodium chloride 75 mL/hr at 10/10/18 0200   PRN Meds: acetaminophen **OR** acetaminophen, labetalol, prochlorperazine   Vital Signs    Vitals:   10/09/18 1613 10/09/18 2135 10/10/18 0406 10/10/18 0850  BP: (!) 157/130 (!) 198/92 (!) 175/80 (!) 165/76  Pulse: 84 77 67 70  Resp: 17 19 19 16   Temp: 98.4 F (36.9 C) 98.4 F (36.9 C) 98.2 F (36.8 C) 98.7 F (37.1 C)  TempSrc: Oral Oral Oral Oral  SpO2: 100% 100% 100% 100%  Weight:  82.1 kg    Height:        Intake/Output Summary (Last 24 hours) at 10/10/2018 0903 Last data filed at 10/10/2018 0855 Gross per 24 hour  Intake 1344.95 ml  Output 2900 ml  Net -1555.05 ml   Filed Weights   10/07/18 2047 10/08/18 2115 10/09/18 2135  Weight: 81.3 kg 82.1 kg 82.1 kg    Physical Exam   General: Elderly, NAD Skin: Warm, dry, intact  Head: Normocephalic, atraumatic, clear, moist mucus membranes. Neck: Negative for carotid bruits. No JVD Lungs:Clear to ausculation bilaterally. No wheezes, rales, or rhonchi. Breathing is unlabored. Cardiovascular: RRR with S1 S2. No murmurs, rubs, gallops, or LV heave appreciated. Extremities: No edema. No clubbing or cyanosis. DP/PT pulses 2+ bilaterally Neuro: Alert and oriented. No focal  deficits. No facial asymmetry. MAE spontaneously. Psych: Responds to questions appropriately with normal affect.    Labs    Chemistry Recent Labs  Lab 10/04/18 1700  10/07/18 0357 10/08/18 1101 10/09/18 0604  NA 138   < > 138 140 139  K 5.1   < > 4.7 3.2* 4.5  CL 106   < > 111 120* 110  CO2 16*   < > 19* 14* 19*  GLUCOSE 156*   < > 108* 96 105*  BUN 62*   < > 48* 40* 40*  CREATININE 5.26*   < > 4.08* 3.13* 3.98*  CALCIUM 8.9   < > 8.5* 7.0* 8.9  PROT 7.3  --   --   --   --   ALBUMIN 3.6  --   --   --   --   AST 30  --   --   --   --   ALT 15  --   --   --   --   ALKPHOS 80  --   --   --   --   BILITOT 1.3*  --   --   --   --   GFRNONAA 10*   < > 13* 18* 14*  GFRAA 11*   < > 15* 21* 16*  ANIONGAP 16*   < > 8  6 10   < > = values in this interval not displayed.     Hematology Recent Labs  Lab 10/07/18 0357 10/08/18 1101 10/09/18 0604  WBC 8.7 6.0 9.1  RBC 3.78* 3.43* 4.11*  HGB 12.0* 10.9* 13.4  HCT 36.4*  35.4* 33.2* 39.5  MCV 96.3 96.8 96.1  MCH 31.7 31.8 32.6  MCHC 33.0 32.8 33.9  RDW 12.9 12.7 12.9  PLT 275 243 315    Cardiac Enzymes Recent Labs  Lab 10/06/18 0219 10/06/18 0706 10/06/18 1836  TROPONINI 0.10* 0.05* 0.06*    Recent Labs  Lab 10/04/18 1725  TROPIPOC 0.03     BNPNo results for input(s): BNP, PROBNP in the last 168 hours.   DDimer No results for input(s): DDIMER in the last 168 hours.   Radiology    No results found.  Telemetry    10/10/2017 NSR- Personally Reviewed  ECG    No new tracings as of 10/10/2017- Personally Reviewed  Cardiac Studies   Echocardiogram 10/05/2018:  Study Conclusions  - Left ventricle: The cavity size was normal. Wall thickness was   increased in a pattern of severe LVH. Systolic function was   vigorous. The estimated ejection fraction was in the range of 65%   to 70%. Doppler parameters are consistent with abnormal left   ventricular relaxation (grade 1 diastolic dysfunction). 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 Valve area (VTI): 1.26   cm^2. Valve area (Vmax): 1.28 cm^2. Valve area (Vmean): 1.12   cm^2. - Mitral valve: Valve area by continuity equation (using LVOT   flow): 1.37 cm^2.  Patient Profile     77 y.o. male with a history of CAD s/p PCI, hyperlipidemia, history of heart block with pacemaker implantation who presented to the hospital  with mental status changes s/p fall with a minimally elevated troponin level.  Assessment & Plan    1.  Elevated troponin level with atypical chest pain: -Troponin, 0.10, 0.05, 0.06  -Elevated troponin likely in the setting of acute renal insufficiency -No evidence of ACS including chest pain or EKG changes today -Reports chest pain on presentation was reproducible with palpation, has not had recurrent episodes -Echocardiogram completed 10/05/2018 with normal LV function and no wall motion abnormalities -No further ischemic work-up for now  2.  HTN: -Uncontrolled but improved, 165/76, 175/80, 198/92 -Continue holding nephrotoxic medications including losartan and HCTZ in the setting of AKI -Hydralazine 50 mg 3 times daily started, amlodipine 10 -Consider further medication adjustments with BB given acute on chronic CKD normal LV function  3.  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

## 2018-10-10 NOTE — Clinical Social Work Placement (Signed)
   CLINICAL SOCIAL WORK PLACEMENT  NOTE 10/10/18 - DAUGHTER BEVERLY Batts PROVIDED WITH FACILITY RESPONSES.  Date:  10/10/2018  Patient Details  Name: William [name] MRN: [id number] Date of Birth: [birth year]  Clinical Social Work is seeking post-discharge placement for this patient at the [place] level of care (*CSW will initial, date and re-position this form in  chart as items are completed):  Yes   Patient/family provided with [place] Work Department's list of facilities offering this level of care within the geographic area requested by the patient (or if unable, by the patient's family).  Yes   Patient/family informed of their freedom to choose among providers that offer the needed level of care, that participate in Medicare, Medicaid or managed care program needed by the patient, have an available bed and are willing to accept the patient.  Yes   Patient/family informed of [facility]'s ownership interest in [facility] and [facility], as well as of the fact that they are under no obligation to receive care at these facilities.  PASRR submitted to EDS on       PASRR number received on       Existing PASRR number confirmed on 10/07/18     FL2 transmitted to all facilities in geographic area requested by pt/family on 10/07/18     FL2 transmitted to all facilities within larger geographic area on       Patient informed that his/her managed care company has contracts with or will negotiate with certain facilities, including the following:        Yes   Patient/family informed of bed offers received.  Patient chooses bed at       Physician recommends and patient chooses bed at      Patient to be transferred to   on  .  Patient to be transferred to facility by       Patient family notified on   of transfer.  Name of family member notified:        PHYSICIAN       Additional Comment:     _______________________________________________ Sable Feil, LCSW 10/10/2018, 6:34 PM

## 2018-10-11 LAB — CBC
HCT: 36.7 % — ABNORMAL LOW (ref 39.0–52.0)
Hemoglobin: 12.5 g/dL — ABNORMAL LOW (ref 13.0–17.0)
MCH: 32.8 pg (ref 26.0–34.0)
MCHC: 34.1 g/dL (ref 30.0–36.0)
MCV: 96.3 fL (ref 80.0–100.0)
Platelets: 346 10*3/uL (ref 150–400)
RBC: 3.81 MIL/uL — ABNORMAL LOW (ref 4.22–5.81)
RDW: 12.9 % (ref 11.5–15.5)
WBC: 8.2 10*3/uL (ref 4.0–10.5)
nRBC: 0 % (ref 0.0–0.2)

## 2018-10-11 LAB — GLUCOSE, CAPILLARY
Glucose-Capillary: 106 mg/dL — ABNORMAL HIGH (ref 70–99)
Glucose-Capillary: 164 mg/dL — ABNORMAL HIGH (ref 70–99)
Glucose-Capillary: 156 mg/dL — ABNORMAL HIGH (ref 70–99)
Glucose-Capillary: 102 mg/dL — ABNORMAL HIGH (ref 70–99)

## 2018-10-11 LAB — PROTEIN ELECTROPHORESIS, SERUM
A/G Ratio: 1 (ref 0.7–1.7)
Albumin ELP: 2.7 g/dL — ABNORMAL LOW (ref 2.9–4.4)
Alpha-1-Globulin: 0.2 g/dL (ref 0.0–0.4)
Alpha-2-Globulin: 0.9 g/dL (ref 0.4–1.0)
Beta Globulin: 0.9 g/dL (ref 0.7–1.3)
Gamma Globulin: 0.6 g/dL (ref 0.4–1.8)
Globulin, Total: 2.6 g/dL (ref 2.2–3.9)
Total Protein ELP: 5.3 g/dL — ABNORMAL LOW (ref 6.0–8.5)

## 2018-10-11 LAB — BASIC METABOLIC PANEL
Anion gap: 10 (ref 5–15)
BUN: 46 mg/dL — ABNORMAL HIGH (ref 8–23)
CO2: 16 mmol/L — ABNORMAL LOW (ref 22–32)
Calcium: 8.3 mg/dL — ABNORMAL LOW (ref 8.9–10.3)
Chloride: 112 mmol/L — ABNORMAL HIGH (ref 98–111)
Creatinine, Ser: 4.01 mg/dL — ABNORMAL HIGH (ref 0.61–1.24)
GFR calc Af Amer: 16 mL/min — ABNORMAL LOW (ref >60)
GFR calc non Af Amer: 13 mL/min — ABNORMAL LOW (ref >60)
Glucose, Bld: 151 mg/dL — ABNORMAL HIGH (ref 70–99)
Potassium: 5.1 mmol/L (ref 3.5–5.1)
Sodium: 138 mmol/L (ref 135–145)

## 2018-10-11 MED ORDER — HYDRALAZINE HCL 50 MG PO TABS
50.0000 mg | ORAL_TABLET | Freq: Three times a day (TID) | ORAL | Status: DC
  Administered 2018-10-11 (×2): 50 mg via ORAL
  Filled 2018-10-11 (×2): qty 1

## 2018-10-11 MED ORDER — HYDRALAZINE HCL 10 MG PO TABS
5.0000 mg | ORAL_TABLET | ORAL | Status: DC | PRN
  Filled 2018-10-11: qty 1

## 2018-10-11 MED ORDER — HYDRALAZINE HCL 50 MG PO TABS: 100.0000 mg | ORAL_TABLET | Freq: Three times a day (TID) | ORAL | Status: DC

## 2018-10-11 NOTE — Progress Notes (Signed)
SLP Cancellation Note  Patient Details Name: William [name] MRN: [id number] DOB: May 11, 1941   Cancelled treatment:        Attempted to see for speech-language-cognitive assessment, however lab present to take blood. Will continue attempts.    Houston Siren 10/11/2018, 1:13 PM   [name] Colvin Caroli.Ed Risk analyst [phone] Office [phone]

## 2018-10-11 NOTE — Progress Notes (Addendum)
[facility] Progress Note  Name: William [name] MRN: [id number] DOB: 02-13-1941  Chief Complaint:  ams and diarrhea   Subjective:  Pt with 500 mL UOP charted over 12/30 as well as uncharted void(s). Spoke with patient's team and daughter.  His daughter is working through issues with his insurance - it is Hydrologist and she has been in touch with staff about this.  Spoke with the patient and his daughter, who makes his decisions.  They would want dialysis if indicated.  She is ok with going ahead and consulting vascular surgery for a fistula while here waiting on placement.  Review of systems: Denies shortness of breath or cough  No n/v No chest pain   Intake/Output Summary (Last 24 hours) at 10/11/2018 1352 Last data filed at 10/11/2018 1034 Gross per 24 hour  Intake 1731.01 ml  Output 1151 ml  Net 580.01 ml    Vitals:  Vitals:   10/10/18 2120 10/11/18 0425 10/11/18 0735 10/11/18 1255  BP: (!) 150/71 (!) 182/87 (!) 184/88 (!) 169/84  Pulse: 75 76 72 66  Resp: 20 20 19    Temp: 98.9 F (37.2 C) 98.4 F (36.9 C) 98.4 F (36.9 C)   TempSrc: Oral Oral Oral   SpO2: 99% 100% 90%   Weight:      Height:         Physical Exam:  General adult male in bed in no acute distress HEENT normocephalic atraumatic extraocular movements intact sclera anicteric Neck supple trachea midline Lungs clear and unlabored at rest Heart regular rate and rhythm no rubs or gallops appreciated Abdomen soft nontender nondistended Extremities no lower extremity edema Psych normal mood and affect   Medications reviewed   Labs:  BMP Latest Ref Rng & Units 10/11/2018 10/10/2018 10/09/2018  Glucose 70 - 99 mg/dL 151(H) 213(H) 105(H)  BUN 8 - 23 mg/dL 46(H) 38(H) 40(H)  Creatinine 0.61 - 1.24 mg/dL 4.01(H) 3.94(H) 3.98(H)  BUN/Creat Ratio 10 - 24 - - -  Sodium 135 - 145 mmol/L 138 137 139  Potassium 3.5 - 5.1 mmol/L 5.1 4.4 4.5  Chloride 98 - 111 mmol/L 112(H) 112(H) 110   CO2 22 - 32 mmol/L 16(L) 17(L) 19(L)  Calcium 8.9 - 10.3 mg/dL 8.3(L) 8.4(L) 8.9     Assessment/Plan:  # AKI  - Pre-renal component setting of losartan/HCTZ as well as diarrhea; improving with hydration (which was d/c'd 12/26).  UA with 100 mg/dL protein and 0-5 RBC's.  Note up/cr ratio with 5440 mg/g protein.  Post void residual is acceptable.  Cr 3 in 12/2017 at [facility] - He appears to be at his baseline  - He has no acute need for dialysis but does have significantly impaired renal function - Would not resume home HCTZ  - Noting proteinuria below - sent SPEP; UPEP is still outstanding/hasn't been collected - Stop normal saline - consult vascular surgery for AVF placement if pt is still inpatient on 1/2  # CKD stage IV  - Secondary in part to DM and HTN - baseline Cr was 3 in 12/2017  # HTN with CKD  - Uncontrolled  - Had been holding losartan and HCTZ.  Would remain off of HCTZ  - Titrated hydralazine to 50 mg TID yesterday afternoon; patient missed this AM's dose.  Would leave at 50 mg TID and reassess   # Proteinuria  - SPEP and UPEP ordered. 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

## 2018-10-11 NOTE — Progress Notes (Addendum)
Progress Note  Patient Name: William [name] Date of Encounter: 10/11/2018  Primary Cardiologist: New- [name]  Subjective   Denies any chest pain or SOB.   Inpatient Medications    Scheduled Meds: . amLODipine  10 mg Oral Daily  . aspirin EC  81 mg Oral Daily  . calcium carbonate  1 tablet Oral Once  . enoxaparin (LOVENOX) injection  30 mg Subcutaneous Q24H  . feeding supplement (ENSURE ENLIVE)  237 mL Oral BID BM  . hydrALAZINE  50 mg Oral Q8H  . insulin aspart  0-9 Units Subcutaneous TID WC  . metoprolol tartrate  150 mg Oral BID  . pantoprazole  20 mg Oral Daily  . rosuvastatin  40 mg Oral q1800  . sodium bicarbonate  650 mg Oral BID   Continuous Infusions: . sodium chloride 75 mL/hr at 10/10/18 1557   PRN Meds: acetaminophen **OR** acetaminophen, hydrALAZINE, labetalol   Vital Signs    Vitals:   10/10/18 1557 10/10/18 2120 10/11/18 0425 10/11/18 0735  BP: (!) 154/88 (!) 150/71 (!) 182/87 (!) 184/88  Pulse: 64 75 76 72  Resp: 19 20 20 19   Temp: 98 F (36.7 C) 98.9 F (37.2 C) 98.4 F (36.9 C) 98.4 F (36.9 C)  TempSrc: Oral Oral Oral Oral  SpO2: 100% 99% 100% 90%  Weight:      Height:        Intake/Output Summary (Last 24 hours) at 10/11/2018 1123 Last data filed at 10/11/2018 1034 Gross per 24 hour  Intake 1831.01 ml  Output 1151 ml  Net 680.01 ml   Filed Weights   10/07/18 2047 10/08/18 2115 10/09/18 2135  Weight: 81.3 kg 82.1 kg 82.1 kg    Physical Exam   GEN: Well nourished, well developed in no acute distress HEENT: Normal NECK: No JVD; No carotid bruits LYMPHATICS: No lymphadenopathy CARDIAC:RRR, no murmurs, rubs, gallops RESPIRATORY:  Clear to auscultation without rales, wheezing or rhonchi  ABDOMEN: Soft, non-tender, non-distended MUSCULOSKELETAL:  No edema; No deformity  SKIN: Warm and dry NEUROLOGIC:  Alert and oriented x 3 PSYCHIATRIC:  Normal affect    Labs    Chemistry Recent Labs  Lab 10/04/18 1700   10/08/18 1101 10/09/18 0604 10/10/18 1024  NA 138   < > 140 139 137  K 5.1   < > 3.2* 4.5 4.4  CL 106   < > 120* 110 112*  CO2 16*   < > 14* 19* 17*  GLUCOSE 156*   < > 96 105* 213*  BUN 62*   < > 40* 40* 38*  CREATININE 5.26*   < > 3.13* 3.98* 3.94*  CALCIUM 8.9   < > 7.0* 8.9 8.4*  PROT 7.3  --   --   --   --   ALBUMIN 3.6  --   --   --   --   AST 30  --   --   --   --   ALT 15  --   --   --   --   ALKPHOS 80  --   --   --   --   BILITOT 1.3*  --   --   --   --   GFRNONAA 10*   < > 18* 14* 14*  GFRAA 11*   < > 21* 16* 16*  ANIONGAP 16*   < > 6 10 8    < > = values in this interval not displayed.  Hematology Recent Labs  Lab 10/08/18 1101 10/09/18 0604 10/10/18 1024  WBC 6.0 9.1 7.4  RBC 3.43* 4.11* 3.97*  HGB 10.9* 13.4 12.9*  HCT 33.2* 39.5 36.8*  MCV 96.8 96.1 92.7  MCH 31.8 32.6 32.5  MCHC 32.8 33.9 35.1  RDW 12.7 12.9 12.8  PLT 243 315 PLATELET CLUMPS NOTED ON SMEAR, COUNT APPEARS ADEQUATE    Cardiac Enzymes Recent Labs  Lab 10/06/18 0219 10/06/18 0706 10/06/18 1836  TROPONINI 0.10* 0.05* 0.06*    Recent Labs  Lab 10/04/18 1725  TROPIPOC 0.03     BNPNo results for input(s): BNP, PROBNP in the last 168 hours.   DDimer No results for input(s): DDIMER in the last 168 hours.   Radiology    No results found.  Telemetry    NSR- Personally Reviewed  ECG    No new tracings as of 10/10/2017- Personally Reviewed  Cardiac Studies   Echocardiogram 10/05/2018:  Study Conclusions  - Left ventricle: The cavity size was normal. Wall thickness was   increased in a pattern of severe LVH. Systolic function was   vigorous. The estimated ejection fraction was in the range of 65%   to 70%. Doppler parameters are consistent with abnormal left   ventricular relaxation (grade 1 diastolic dysfunction). 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 Valve area (VTI): 1.26   cm^2. Valve area (Vmax): 1.28 cm^2. Valve area (Vmean): 1.12   cm^2. - Mitral  valve: Valve area by continuity equation (using LVOT   flow): 1.37 cm^2.  Patient Profile     77 y.o. male with a history of CAD s/p PCI, hyperlipidemia, history of heart block with pacemaker implantation who presented to the hospital  with mental status changes s/p fall with a minimally elevated troponin level.  Assessment & Plan    1.  Elevated troponin level with atypical chest pain -Troponin, 0.10, 0.05, 0.06  -Elevated troponin with flat trend likely in the setting of acute renal insufficiency -Not consistent with ACS.  EKG paced. -Reports chest pain constant x 3 days on presentation with minimal trop bump and echo 10/05/2018 with normal LV function and no wall motion abnormalities -No further ischemic work-up for now as he is not a candidate for cath given progression of CKD   2.  HTN -BP remains poorly controlled 184/13mmHg today -Continue holding nephrotoxic medications including losartan and HCTZ in the setting of AKI -continue amlodipine 10mg  daily, Lopressor 150mg  BID -increase Hydralazine to 75mg  TID  3.  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  Wide complex tachycardia -verified as VT on pacer check -normal LVF on echo.   -given worsening renal function would not be a cath candidate at present as would likely result in need for HD.  His CP he had this admit is not like what he had with his CAD and is more sharp. -continue BB therapy.  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 AL which portends a worse prognosis) -Will get PYP scan tomorrow  -start Amiodarone 400mg  BID -need to establish goals of care going forward given worsening of renal function  5.  Severe LVH -noted on echo - likely related to poorly controlled HTN but need to consider  amyloidosis given underlying renal dz as well. Get PYP scan tomorrow -SPEP/UPEP pending  I have spent a total of 35 minutes with patient reviewing 2D echo , telemetry, EKGs, labs and examining patient as well as establishing an assessment and plan that was discussed with the patient.  > 50% of time was spent in direct patient care.    Signed, Fransico Him, MD [facility] Pager: [phone] 10/11/2018, 11:23 AM     For questions or updates, please contact   Please consult [email or web address] for contact info under Cardiology/STEMI.

## 2018-10-11 NOTE — Social Work (Addendum)
1:45pm- Spoke with pt daughter [name], she made CSW aware that pt plan will change to [facility] tomorrow. Updated offers with pt daughter with SNFs that offer placement and accept new insurance. She will continue to look at SNFs.  11:11am- Reached out to pt daughter Rise Paganini via telephone, she and her sisters will go to SNF today to visit and have a decision to start insurance authorization today.  They are aware we need decision today to start insurance approval prior to holiday.  Westley Hummer, MSW, [facility] [phone]

## 2018-10-11 NOTE — Progress Notes (Signed)
Family Medicine Teaching Service Daily Progress Note Intern Pager: [phone]  Patient name: William [name] Medical record number: [id number] Date of birth: [birth year] Age: 77 y.o. Gender: male  Primary Care Provider: Patient, No Pcp Per Consultants: None Code Status: DNR  Pt Overview and Major Events to Date:  10/04/2018-patient admitted for headache, fall  Assessment and Plan: William [name] is a 77 year old male presenting after a fall with a headache, found to have altered mental status, AKI, and hypertensive urgency.  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  AMS likely 2/2 progressing dementia:   Moca done on 12/29 was 11/30.  CVA, infection, medications have been ruled out as possible causes to acutely worsen AMS.  Symptoms likely progression of dementia. 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 Nephrology suspects dehydration in the setting of diarrhea and continued use of HCTZ and losartan.  Protein:creatinine ratio is elevated at 5.44.  Protein/creatinine ratio 5440 mg/g. nephrology ordered serum and urine protein electrophoresis.  Cr: 5.26>4.4>4.08>4.08>3.13>3.98(12/29)>3.98(12/30).  -Nephrology following -Hold losartan and HCTZ -Light hydration with NS at 75 mLs/hr,  - Avoid nephrotoxic medications as possible, holding home losartan and HCTZ - Strict I&O  Non-sustained ventricular tachycardia: Recurrent. Recurrent nonsustained runs of V. tach.  Cardiology recommending beta-blocker, no further intervention. -Monitor on telemetry -Continue metoprolol 150 mg twice daily  Hypertensive Urgency, in setting of Chronic Hypertension SBPs 150s to 180s, DBPs 71-88 in the past 24  hours. -Continue metoprolol 150 mg twice daily -Continue amlodipine 10 mg -Hydralazine 50 mg every TID  - labetalol 5 mg PRN -Holding home losartan 100mg  and HCTZ 25mg  in the setting of AKI  Chest pain: Resolved. Cardiology noted that his elevated troponins initially are likely related to his renal insufficiency.  There is minimal concern for his nonsustained V. tach.  Cardiology she has signed off and plan to have him follow-up outpatient for continued pacer care. -Continue aspirin 81 mg -Continue rosuvastatin 10 mg   Hypokalemia: Resolved. -Monitor BMP  Diarrhea, Acute: Improved. No further episodes since yesterday morning.   -Strict I's and O's  Type 2 Diabetes Mellitus: Chronic, stable.  A1c 6.3.  CBG average low 100s.  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  See chest pain assessment above.  EKG paced rhythm.  -Cardiology on board, sent message to EP to establish care -Monitor vitals  Sacral ulcer, likely secondary to fungal infection Per wound care, leave dressings off of the affected area and apply antifungal powder every time patient is turned.  These instructions have been discussed with the patient. -Continue to monitor   CAD s/p PCI:  Chronic, stable See chest pain assessment as above. -Continue home rosuvastatin 10 mg daily -Start Lopressor 150 mg twice daily, from 100 twice daily -Cont aspirin 81 mg for secondary prevention  FEN/GI: Carb modified diet Prophylaxis: Lovenox for decreased creatinine clearance  Disposition:  Discharge to SNF pending renal work-up  Subjective:  William [name] was awake alert and oriented to person, place and year this morning.  This is more oriented than  I have seen him for the past 3 days.  He reported no new complaints/symptoms this a.m.   He denied chest pain, stomach pain, headache.  He is still prone to nonspecific ramblings still has gaps in his knowledge though his mental status may be mildly improved from yesterday.   Objective: Temp:  [98 F (36.7 C)-98.9 F (37.2 C)] 98.4 F (36.9 C) (12/31 0425) Pulse Rate:  [64-76] 76 (12/31 0425) Resp:  [16-20] 20 (12/31 0425) BP: (150-182)/(71-88) 182/87 (12/31 0425) SpO2:  [99 %-100 %] 100 % (12/31 0425)  Physical exam:  General: Alert and cooperative and appears to be in no acute distress HEENT: Neck non-tender without lymphadenopathy, masses or thyromegaly Cardio: Normal A1 and S2, S4 present. Rhythm is regular. 1/6 systolic murmur   Pulm: Clear to auscultation bilaterally, no crackles, wheezing, or diminished breath sounds. Normal respiratory effort Abdomen: Bowel sounds normal. Abdomen soft and non-tender.  Extremities: No peripheral edema. Warm/ well perfused.  Strong radial pulse. Neuro: Cranial nerves grossly intact  Laboratory: Recent Labs  Lab 10/08/18 1101 10/09/18 0604 10/10/18 1024  WBC 6.0 9.1 7.4  HGB 10.9* 13.4 12.9*  HCT 33.2* 39.5 36.8*  PLT 243 315 PLATELET CLUMPS NOTED ON SMEAR, COUNT APPEARS ADEQUATE   Recent Labs  Lab 10/04/18 1700  10/08/18 1101 10/09/18 0604 10/10/18 1024  NA 138   < > 140 139 137  K 5.1   < > 3.2* 4.5 4.4  CL 106   < > 120* 110 112*  CO2 16*   < > 14* 19* 17*  BUN 62*   < > 40* 40* 38*  CREATININE 5.26*   < > 3.13* 3.98* 3.94*  CALCIUM 8.9   < > 7.0* 8.9 8.4*  PROT 7.3  --   --   --   --   BILITOT 1.3*  --   --   --   --   ALKPHOS 80  --   --   --   --   ALT 15  --   --   --   --   AST 30  --   --   --   --   GLUCOSE 156*   < > 96 105* 213*   < > = values in this interval not displayed.   Imaging/Diagnostic Tests: Dg Chest 2 View  Result Date: 10/04/2018 CLINICAL DATA:  Confusion, fatigue, rule out occult infection. Pt only c/o diarrhea to xray staff, stating he's had diarrhea for weeks. Hx of HTN,  DM.confusion, fatigue, rule out occult infection EXAM: CHEST - 2 VIEW COMPARISON:  10/18/2017 FINDINGS: LEFT-sided pacemaker overlies normal cardiac silhouette. No effusion, infiltrate, pneumothorax. Degenerative osteophytosis of the spine. IMPRESSION: No acute cardiopulmonary process. Electronically Signed   By: Suzy Bouchard M.D.   On: 10/04/2018 18:31   Ct Head Wo Contrast  Result Date: 10/04/2018 CLINICAL DATA:  fall, AMS, hx of brain surgeryAltered level of consciousness (LOC), unexplained fall, AMS, hx of brain surgery EXAM: CT HEAD WITHOUT CONTRAST TECHNIQUE: Contiguous axial images were obtained from the base of the skull through the vertex without intravenous contrast. COMPARISON:  None. FINDINGS: Brain: No acute intracranial hemorrhage. No focal mass lesion. No CT evidence of acute infarction. No midline shift or mass effect. No hydrocephalus. Basilar cisterns are patent. There are periventricular and subcortical white matter hypodensities. Generalized cortical atrophy. Vascular: No hyperdense vessel or unexpected calcification. Skull: Prior RIGHT craniotomy. Sinuses/Orbits: Orbits normal. Coastal thickening in the ethmoid sinuses Other: None IMPRESSION: 1.  No acute intracranial findings. 2. Atrophy and white matter microvascular disease. Electronically Signed   By: Suzy Bouchard M.D.   On: 10/04/2018 19:56   US Renal  Result Date: 10/05/2018 CLINICAL DATA:  Acute onset of renal insufficiency. EXAM: RENAL / URINARY TRACT ULTRASOUND COMPLETE COMPARISON:  None. FINDINGS: Right Kidney: Renal measurements: 10.6 x 6.0 x 6.0 cm = volume: 202.9 mL. Increased parenchymal echogenicity is noted. A 1.5 cm cyst is noted at the interpole region of the right kidney. No hydronephrosis visualized. Left Kidney: Renal measurements: 9.6 x 4.9 x 5.2 cm = volume: 127.3 mL. Increased parenchymal echogenicity is noted. Cysts are noted at the lower pole and upper pole of the left kidney, measuring 3.2 cm and 1.5  cm in size. The larger cyst has a septation and question of a peripheral soft tissue component. No hydronephrosis visualized. Bladder: Appears normal for degree of bladder distention. IMPRESSION: 1. No evidence of hydronephrosis. 2. Increased renal parenchymal echogenicity raises concern for medical renal disease. 3. Bilateral renal cysts. A somewhat complex 3.2 cm cystic lesion at the lower pole of the left kidney has a septation and question of a peripheral soft tissue component. Contrast-enhanced CT or MRI would be helpful for further evaluation, on an elective nonemergent basis. Electronically Signed   By: Garald Balding M.D.   On: 10/05/2018 00:40   Matilde Haymaker, MD 10/11/2018, 5:58 AM PGY-1, [place] Intern pager: [phone], text pages welcome

## 2018-10-12 DIAGNOSIS — I2583 Coronary atherosclerosis due to lipid rich plaque: Secondary | ICD-10-CM

## 2018-10-12 DIAGNOSIS — I2510 Atherosclerotic heart disease of native coronary artery without angina pectoris: Secondary | ICD-10-CM

## 2018-10-12 DIAGNOSIS — I517 Cardiomegaly: Secondary | ICD-10-CM

## 2018-10-12 LAB — GLUCOSE, CAPILLARY
Glucose-Capillary: 88 mg/dL (ref 70–99)
Glucose-Capillary: 101 mg/dL — ABNORMAL HIGH (ref 70–99)
Glucose-Capillary: 98 mg/dL (ref 70–99)
Glucose-Capillary: 116 mg/dL — ABNORMAL HIGH (ref 70–99)

## 2018-10-12 LAB — BASIC METABOLIC PANEL
Anion gap: 10 (ref 5–15)
Anion gap: 10 (ref 5–15)
BUN: 49 mg/dL — ABNORMAL HIGH (ref 8–23)
BUN: 52 mg/dL — ABNORMAL HIGH (ref 8–23)
CO2: 17 mmol/L — ABNORMAL LOW (ref 22–32)
CO2: 17 mmol/L — ABNORMAL LOW (ref 22–32)
Calcium: 8.8 mg/dL — ABNORMAL LOW (ref 8.9–10.3)
Calcium: 8.8 mg/dL — ABNORMAL LOW (ref 8.9–10.3)
Chloride: 112 mmol/L — ABNORMAL HIGH (ref 98–111)
Chloride: 111 mmol/L (ref 98–111)
Creatinine, Ser: 4.03 mg/dL — ABNORMAL HIGH (ref 0.61–1.24)
Creatinine, Ser: 3.92 mg/dL — ABNORMAL HIGH (ref 0.61–1.24)
GFR calc Af Amer: 16 mL/min — ABNORMAL LOW (ref >60)
GFR calc Af Amer: 16 mL/min — ABNORMAL LOW (ref >60)
GFR calc non Af Amer: 13 mL/min — ABNORMAL LOW (ref >60)
GFR calc non Af Amer: 14 mL/min — ABNORMAL LOW (ref >60)
Glucose, Bld: 145 mg/dL — ABNORMAL HIGH (ref 70–99)
Glucose, Bld: 110 mg/dL — ABNORMAL HIGH (ref 70–99)
Potassium: 4.2 mmol/L (ref 3.5–5.1)
Potassium: 4.4 mmol/L (ref 3.5–5.1)
Sodium: 139 mmol/L (ref 135–145)
Sodium: 138 mmol/L (ref 135–145)

## 2018-10-12 LAB — CBC
HCT: 40.4 % (ref 39.0–52.0)
Hemoglobin: 13.7 g/dL (ref 13.0–17.0)
MCH: 32.9 pg (ref 26.0–34.0)
MCHC: 33.9 g/dL (ref 30.0–36.0)
MCV: 96.9 fL (ref 80.0–100.0)
Platelets: 316 10*3/uL (ref 150–400)
RBC: 4.17 MIL/uL — ABNORMAL LOW (ref 4.22–5.81)
RDW: 13.2 % (ref 11.5–15.5)
WBC: 9.8 10*3/uL (ref 4.0–10.5)
nRBC: 0 % (ref 0.0–0.2)

## 2018-10-12 MED ORDER — HEPARIN SODIUM (PORCINE) 5000 UNIT/ML IJ SOLN
5000.0000 [IU] | Freq: Three times a day (TID) | INTRAMUSCULAR | Status: DC
  Administered 2018-10-13 – 2018-10-21 (×14): 5000 [IU] via SUBCUTANEOUS
  Filled 2018-10-12 (×14): qty 1

## 2018-10-12 MED ORDER — AMIODARONE HCL 200 MG PO TABS
200.0000 mg | ORAL_TABLET | Freq: Two times a day (BID) | ORAL | Status: DC
  Administered 2018-10-12 – 2018-10-13 (×3): 200 mg via ORAL
  Filled 2018-10-12 (×3): qty 1

## 2018-10-12 MED ORDER — HYDRALAZINE HCL 50 MG PO TABS
100.0000 mg | ORAL_TABLET | Freq: Three times a day (TID) | ORAL | Status: DC
  Administered 2018-10-12 – 2018-10-21 (×27): 100 mg via ORAL
  Filled 2018-10-12 (×28): qty 2

## 2018-10-12 MED ORDER — SODIUM BICARBONATE 650 MG PO TABS
1300.0000 mg | ORAL_TABLET | Freq: Two times a day (BID) | ORAL | Status: DC
  Administered 2018-10-12 – 2018-10-16 (×8): 1300 mg via ORAL
  Filled 2018-10-12 (×8): qty 2

## 2018-10-12 NOTE — Progress Notes (Addendum)
Progress Note  Patient Name: William [name] Date of Encounter: 10/12/2018  Primary Cardiologist: New- [name]  Subjective   Denies any chest pain or SOB.  Had SVT on tele this am  Inpatient Medications    Scheduled Meds: . amLODipine  10 mg Oral Daily  . aspirin EC  81 mg Oral Daily  . calcium carbonate  1 tablet Oral Once  . enoxaparin (LOVENOX) injection  30 mg Subcutaneous Q24H  . feeding supplement (ENSURE ENLIVE)  237 mL Oral BID BM  . hydrALAZINE  100 mg Oral TID  . insulin aspart  0-9 Units Subcutaneous TID WC  . metoprolol tartrate  150 mg Oral BID  . pantoprazole  20 mg Oral Daily  . rosuvastatin  40 mg Oral q1800  . sodium bicarbonate  650 mg Oral BID   Continuous Infusions: . sodium chloride 75 mL/hr at 10/12/18 0655   PRN Meds: acetaminophen **OR** acetaminophen, hydrALAZINE, labetalol   Vital Signs    Vitals:   10/11/18 2114 10/12/18 0351 10/12/18 0858 10/12/18 0944  BP: (!) 163/72 (!) 174/79 (!) 165/134 (!) 142/76  Pulse: 76 68 75   Resp: 18 18 18    Temp: 98.6 F (37 C) 98.3 F (36.8 C) 97.9 F (36.6 C)   TempSrc: Oral Oral Oral   SpO2: 99% 98% 100%   Weight: 81.5 kg     Height:        Intake/Output Summary (Last 24 hours) at 10/12/2018 0957 Last data filed at 10/12/2018 0500 Gross per 24 hour  Intake 1944.95 ml  Output 1225 ml  Net 719.95 ml   Filed Weights   10/08/18 2115 10/09/18 2135 10/11/18 2114  Weight: 82.1 kg 82.1 kg 81.5 kg    Physical Exam   GEN: Well nourished, well developed in no acute distress HEENT: Normal NECK: No JVD; No carotid bruits LYMPHATICS: No lymphadenopathy CARDIAC:RRR, no murmurs, rubs, gallops RESPIRATORY:  Clear to auscultation without rales, wheezing or rhonchi  ABDOMEN: Soft, non-tender, non-distended MUSCULOSKELETAL:  No edema; No deformity  SKIN: Warm and dry NEUROLOGIC:  Alert and oriented x 3 PSYCHIATRIC:  Normal affect    Labs    Chemistry Recent Labs  Lab 10/09/18 0604  10/10/18 1024 10/11/18 1147  NA 139 137 138  K 4.5 4.4 5.1  CL 110 112* 112*  CO2 19* 17* 16*  GLUCOSE 105* 213* 151*  BUN 40* 38* 46*  CREATININE 3.98* 3.94* 4.01*  CALCIUM 8.9 8.4* 8.3*  GFRNONAA 14* 14* 13*  GFRAA 16* 16* 16*  ANIONGAP 10 8 10      Hematology Recent Labs  Lab 10/09/18 0604 10/10/18 1024 10/11/18 1322  WBC 9.1 7.4 8.2  RBC 4.11* 3.97* 3.81*  HGB 13.4 12.9* 12.5*  HCT 39.5 36.8* 36.7*  MCV 96.1 92.7 96.3  MCH 32.6 32.5 32.8  MCHC 33.9 35.1 34.1  RDW 12.9 12.8 12.9  PLT 315 PLATELET CLUMPS NOTED ON SMEAR, COUNT APPEARS ADEQUATE 346    Cardiac Enzymes Recent Labs  Lab 10/06/18 0219 10/06/18 0706 10/06/18 1836  TROPONINI 0.10* 0.05* 0.06*    No results for input(s): TROPIPOC in the last 168 hours.   BNPNo results for input(s): BNP, PROBNP in the last 168 hours.   DDimer No results for input(s): DDIMER in the last 168 hours.   Radiology    No results found.  Telemetry    NSR- Personally Reviewed  ECG    No new EKG to review- Personally Reviewed  Cardiac Studies  Echocardiogram 10/05/2018:  Study Conclusions  - Left ventricle: The cavity size was normal. Wall thickness was   increased in a pattern of severe LVH. Systolic function was   vigorous. The estimated ejection fraction was in the range of 65%   to 70%. Doppler parameters are consistent with abnormal left   ventricular relaxation (grade 1 diastolic dysfunction). 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 Valve area (VTI): 1.26   cm^2. Valve area (Vmax): 1.28 cm^2. Valve area (Vmean): 1.12   cm^2. - Mitral valve: Valve area by continuity equation (using LVOT   flow): 1.37 cm^2.  Patient Profile     78 y.o. male with a history of CAD s/p PCI, hyperlipidemia, history of heart block with pacemaker implantation who presented to the hospital  with mental status changes s/p fall with a minimally elevated troponin level.  Assessment & Plan    1.  Elevated troponin level  with atypical chest pain -Troponin, 0.10, 0.05, 0.06  -Elevated troponin with flat trend likely in the setting of acute renal insufficiency -Not consistent with ACS.  EKG paced. -Reports chest pain constant x 3 days on presentation with minimal trop bump and echo 10/05/2018 with normal LV function and no wall motion abnormalities -No further ischemic work-up for now as he is not a candidate for cath given progression of CKD   2.  HTN -BP remains poorly controlled 165/134 this am and later 142/38mmHg -Continue holding nephrotoxic medications including losartan and HCTZ in the setting of AKI -continue amlodipine 10mg  daily, Lopressor 150mg  BID -increase Hydralazine to 100mg  TID  3.  Acute on chronic CKD stage IV -Per nephrology note, prerenal component in the setting of DM, HTN exacerbated by dehydration -Continue holding losartan and HCTZ -creatinine continues to trend upward and now up to 4.01 yesterday -Per nephrology  4.  Wide complex tachycardia -verified as VT on pacer check -normal LVF on echo.   -given worsening renal function would not be a cath candidate at present as would likely result in need for HD.  His CP he had this admit is not like what he had with his CAD and is more sharp. -continue BB therapy.  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 AL which portends a worse prognosis) -PYP scan ordered for today -start Amiodarone 200mg  BID load -check daily EKG for QTc -need to establish goals of care going forward given worsening of renal function  5.  Severe LVH -noted on echo - likely related to poorly controlled HTN but need to consider amyloidosis given underlying renal dz as well.  -SPEP/UPEP pending -PYP scan planned for today  Signed, Fransico Him, MD [facility] Pager: [phone] 10/12/2018, 9:57 AM     For questions or updates, please contact   Please consult [email or web address]  for contact info under Cardiology/STEMI.
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
## 2018-10-12 NOTE — Progress Notes (Signed)
[facility] Progress Note  Name: William [name] MRN: [id number] DOB: 09/22/1941  Subjective:  Pt with 1425 mL UOP charted over 12/31. He wants a 'straight answer' about his kidneys and was satisfied with my explanation.  Cardiology pursuing imaging for amyloid.   Review of systems: Denies shortness of breath or cough  No n/v No chest pain   Intake/Output Summary (Last 24 hours) at 10/12/2018 1233 Last data filed at 10/12/2018 0900 Gross per 24 hour  Intake 2064.95 ml  Output 775 ml  Net 1289.95 ml    Vitals:  Vitals:   10/11/18 2114 10/12/18 0351 10/12/18 0858 10/12/18 0944  BP: (!) 163/72 (!) 174/79 (!) 165/134 (!) 142/76  Pulse: 76 68 75   Resp: 18 18 18    Temp: 98.6 F (37 C) 98.3 F (36.8 C) 97.9 F (36.6 C)   TempSrc: Oral Oral Oral   SpO2: 99% 98% 100%   Weight: 81.5 kg     Height:         Physical Exam:  General adult male standing beside bed in no acute distress HEENT normocephalic atraumatic extraocular movements intact sclera anicteric Neck supple trachea midline Lungs clear and unlabored at rest Heart regular rate and rhythm no rubs or gallops appreciated Abdomen soft nontender nondistended Extremities no lower extremity edema Psych normal mood and affect   Medications reviewed   Labs:  BMP Latest Ref Rng & Units 10/11/2018 10/10/2018 10/09/2018  Glucose 70 - 99 mg/dL 151(H) 213(H) 105(H)  BUN 8 - 23 mg/dL 46(H) 38(H) 40(H)  Creatinine 0.61 - 1.24 mg/dL 4.01(H) 3.94(H) 3.98(H)  BUN/Creat Ratio 10 - 24 - - -  Sodium 135 - 145 mmol/L 138 137 139  Potassium 3.5 - 5.1 mmol/L 5.1 4.4 4.5  Chloride 98 - 111 mmol/L 112(H) 112(H) 110  CO2 22 - 32 mmol/L 16(L) 17(L) 19(L)  Calcium 8.9 - 10.3 mg/dL 8.3(L) 8.4(L) 8.9     Assessment/Plan:  # AKI  - Pre-renal component setting of losartan/HCTZ as well as diarrhea; improving with hydration (which was d/c'd 12/26).  UA with 100 mg/dL protein and 0-5 RBC's.  Note up/cr ratio with 5440 mg/g  protein.  Post void residual is acceptable.  Cr 3 in 12/2017 at [place] - He has no acute need for dialysis but does have significantly impaired renal function - Would not resume home HCTZ  - Noting proteinuria below - sent SPEP; UPEP is still outstanding/hasn't been collected - Stop normal saline - consider consult vascular surgery for AVF placement if pt is still inpatient on 1/2  # CKD stage IV  - Secondary in part to DM and HTN - baseline Cr was 3 in 12/2017  # HTN with CKD  - Uncontrolled  - Holding losartan and HCTZ.  Would remain off of HCTZ  - Remains hypertensive, hydralazine has been increased to 100 TID.   # Proteinuria  - SPEP and UPEP ordered. HbA1c 6.5 - Likely 2/2 diabetes   # Diarrhea  - resolving - Continue supportive care   # Metabolic acidosis - 2/2 AoCKD as well as diarrhea; hydration - Increase supplemental bicarbonate to 1300 BID  # Renal cyst - complex  - Non-emergent follow-up imaging needed (CT vs MRI recommended, but with advanced CKD neither is good option)   # AMS  - Note daughter is pursuing POA status as he has had a decline in his functional status and higher level skills such as bill paying - Multifactorial  - statin  d/cd per family request   Justin Mend, MD 10/12/2018 12:33 PM

## 2018-10-12 NOTE — Progress Notes (Addendum)
Family Medicine Teaching Service Daily Progress Note Intern Pager: [phone]  Patient name: William [name] Medical record number: [id number] Date of birth: 1941-04-28 Age: 78 y.o. Gender: male  Primary Care Provider: Patient, No Pcp Per Consultants: None Code Status: DNR  Pt Overview and Major Events to Date:  10/04/2018-patient admitted for headache, fall  Assessment and Plan: William [name] is a 78 year old male presenting after a fall with a headache, found to have altered mental status, AKI, and hypertensive urgency.  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  This would be consistent with his renal picture as well. 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 Nephrology suspects dehydration in the setting of diarrhea and continued use of HCTZ and losartan.  Protein:creatinine ratio is elevated at 5.44.  Protein/creatinine ratio 5440 mg/g. nephrology ordered serum and urine protein electrophoresis.  Cr: 5.26>4.4>4.08>4.08>3.13>3.98(12/29)>3.98(12/30)>4.01(12/31).  Creatinine close to 4 and a GFR around 15 appears to be William [name] new baseline.  Nephrology is now pursuing access/work-up for dialysis.  Per nephrology note, this was discussed with the patient and daughter on 12/31 who are inclined to pursue access. We would like to await the results of the cardiac imaging(see above) to move forward with vascular access as a diagnosis of amyloid would significantly change the prognosis. It would be helpful to have another William [name] discussion with family and palliative following the cardiac imaging before taking further steps toward vascular access. -Nephrology following - Avoid nephrotoxic  medications as possible, holding home losartan and HCTZ - Strict I&O  AMS likely 2/2 progressing dementia:   Moca done on 12/29 was 11/30.  CVA, infection, medications have been ruled out as possible causes to acutely worsen AMS.  Symptoms likely progression of dementia. -Follow-up with social work on SNF placement  - lunch time labs (potentially best mental status at lunch if he is sundowning)  Non-sustained ventricular tachycardia: Recurrent. Recurrent nonsustained runs of V. tach.  Cardiology recommending beta-blocker, no further intervention. -Monitor on telemetry -Continue metoprolol 150 mg twice daily --Amiodarone 200 mg BID load  Hypertensive, in setting of Chronic Hypertension, poorly controlled SB 155-184, DBP 84-90 in the past 24 hours (1/1). -Continue metoprolol 150 mg twice daily -Continue amlodipine 10 mg -Hydralazine 100 mg every TID  - labetalol 5 mg PRN  Chest pain: Resolved. Cardiology noted that his elevated troponins initially are likely related to his renal insufficiency.  There is minimal concern for his nonsustained V. tach.  Cardiology she has signed off and plan to have him follow-up outpatient for continued pacer care. -Continue aspirin 81 mg -Continue rosuvastatin 10 mg   Hypokalemia: Resolved. -Monitor BMP  Diarrhea, Acute: Improved. No further episodes since yesterday morning.   -Strict I's and O's  Type 2 Diabetes Mellitus: Chronic, stable.  A1c 6.3.  CBG average low 100s.  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  See chest pain assessment above.  EKG paced rhythm.  -Cardiology on board, sent message to EP to establish care -Monitor vitals  Sacral ulcer, likely secondary to fungal infection Per wound care, leave dressings  off of the affected area and  apply antifungal powder every time patient is turned.  These instructions have been discussed with the patient. -Continue to monitor   CAD s/p PCI:  Chronic, stable See chest pain assessment as above. -Continue home rosuvastatin 10 mg daily -Start Lopressor 150 mg twice daily, from 100 twice daily -Cont aspirin 81 mg for secondary prevention  FEN/GI: Carb modified diet Prophylaxis: Lovenox for decreased creatinine clearance  Disposition:  Discharge to SNF pending renal work-up  Subjective:  William [name] was alert and oriented to person and year this morning he was not aware that he was in the hospital receiving medical treatment.  In the course of our conversation he was able to remember his medical history.  Though he lacks capacity this morning he was stating that during previous conversations about dialysis he was not interested in dialysis.  He had no new complaints this morning and specifically denied headaches, chest pain, shortness of breath, stomach pain.   Objective: Temp:  [98.2 F (36.8 C)-98.6 F (37 C)] 98.3 F (36.8 C) (01/01 0351) Pulse Rate:  [66-76] 68 (01/01 0351) Resp:  [18-19] 18 (01/01 0351) BP: (155-184)/(72-90) 174/79 (01/01 0351) SpO2:  [90 %-100 %] 98 % (01/01 0351) Weight:  [81.5 kg] 81.5 kg (12/31 2114)  Physical exam:  General: Alert and cooperative and appears to be in no acute distress HEENT: Neck non-tender without lymphadenopathy, masses or thyromegaly Cardio: Normal A1 and S2, S4 present. No murmurs or rubs.   Pulm: Clear to auscultation bilaterally, no crackles, wheezing, or diminished breath sounds. Normal respiratory effort Abdomen: Bowel sounds normal. Abdomen soft and non-tender.  Extremities: No peripheral edema. Warm/ well perfused.  Strong radial pulses. Neuro: Cranial nerves grossly intact  Laboratory: Recent Labs  Lab 10/09/18 0604 10/10/18 1024 10/11/18 1322  WBC 9.1 7.4 8.2  HGB 13.4 12.9* 12.5*  HCT 39.5 36.8* 36.7*  PLT 315  PLATELET CLUMPS NOTED ON SMEAR, COUNT APPEARS ADEQUATE 346   Recent Labs  Lab 10/09/18 0604 10/10/18 1024 10/11/18 1147  NA 139 137 138  K 4.5 4.4 5.1  CL 110 112* 112*  CO2 19* 17* 16*  BUN 40* 38* 46*  CREATININE 3.98* 3.94* 4.01*  CALCIUM 8.9 8.4* 8.3*  GLUCOSE 105* 213* 151*   Imaging/Diagnostic Tests: Dg Chest 2 View  Result Date: 10/04/2018 CLINICAL DATA:  Confusion, fatigue, rule out occult infection. Pt only c/o diarrhea to xray staff, stating he's had diarrhea for weeks. Hx of HTN, DM.confusion, fatigue, rule out occult infection EXAM: CHEST - 2 VIEW COMPARISON:  10/18/2017 FINDINGS: LEFT-sided pacemaker overlies normal cardiac silhouette. No effusion, infiltrate, pneumothorax. Degenerative osteophytosis of the spine. IMPRESSION: No acute cardiopulmonary process. Electronically Signed   By: Suzy Bouchard M.D.   On: 10/04/2018 18:31   Ct Head Wo Contrast  Result Date: 10/04/2018 CLINICAL DATA:  fall, AMS, hx of brain surgeryAltered level of consciousness (LOC), unexplained fall, AMS, hx of brain surgery EXAM: CT HEAD WITHOUT CONTRAST TECHNIQUE: Contiguous axial images were obtained from the base of the skull through the vertex without intravenous contrast. COMPARISON:  None. FINDINGS: Brain: No acute intracranial hemorrhage. No focal mass lesion. No CT evidence of acute infarction. No midline shift or mass effect. No hydrocephalus. Basilar cisterns are patent. There are periventricular and subcortical white matter hypodensities. Generalized cortical atrophy. Vascular: No hyperdense vessel or unexpected calcification. Skull: Prior RIGHT craniotomy. Sinuses/Orbits: Orbits normal. Coastal thickening in the ethmoid sinuses Other: None IMPRESSION: 1. No acute intracranial findings. 2.  Atrophy and white matter microvascular disease. Electronically Signed   By: Suzy Bouchard M.D.   On: 10/04/2018 19:56   US Renal  Result Date: 10/05/2018 CLINICAL DATA:  Acute onset of renal  insufficiency. EXAM: RENAL / URINARY TRACT ULTRASOUND COMPLETE COMPARISON:  None. FINDINGS: Right Kidney: Renal measurements: 10.6 x 6.0 x 6.0 cm = volume: 202.9 mL. Increased parenchymal echogenicity is noted. A 1.5 cm cyst is noted at the interpole region of the right kidney. No hydronephrosis visualized. Left Kidney: Renal measurements: 9.6 x 4.9 x 5.2 cm = volume: 127.3 mL. Increased parenchymal echogenicity is noted. Cysts are noted at the lower pole and upper pole of the left kidney, measuring 3.2 cm and 1.5 cm in size. The larger cyst has a septation and question of a peripheral soft tissue component. No hydronephrosis visualized. Bladder: Appears normal for degree of bladder distention. IMPRESSION: 1. No evidence of hydronephrosis. 2. Increased renal parenchymal echogenicity raises concern for medical renal disease. 3. Bilateral renal cysts. A somewhat complex 3.2 cm cystic lesion at the lower pole of the left kidney has a septation and question of a peripheral soft tissue component. Contrast-enhanced CT or MRI would be helpful for further evaluation, on an elective nonemergent basis. Electronically Signed   By: Garald Balding M.D.   On: 10/05/2018 00:40   Matilde Haymaker, MD 10/12/2018, 6:12 AM PGY-1, [facility] Intern pager: [phone], text pages welcome

## 2018-10-12 NOTE — Progress Notes (Signed)
SLP Cancellation Note  Patient Details Name: [name] MRN: [id number] DOB: [birth year]   Cancelled treatment:       Reason Eval/Treat Not Completed: Other (comment)(Pt on bedside commode; will check back for completion of SLE    Ilya Ess E Calyse Murcia MA, CCC-SLP  10/12/2018, 11:12 AM

## 2018-10-13 ENCOUNTER — Inpatient Hospital Stay (HOSPITAL_COMMUNITY): Payer: Medicare Other

## 2018-10-13 LAB — CBC
HCT: 32.6 % — ABNORMAL LOW (ref 39.0–52.0)
Hemoglobin: 11.2 g/dL — ABNORMAL LOW (ref 13.0–17.0)
MCH: 32.7 pg (ref 26.0–34.0)
MCHC: 34.4 g/dL (ref 30.0–36.0)
MCV: 95 fL (ref 80.0–100.0)
Platelets: UNDETERMINED 10*3/uL (ref 150–400)
RBC: 3.43 MIL/uL — ABNORMAL LOW (ref 4.22–5.81)
RDW: 12.9 % (ref 11.5–15.5)
WBC: 8.8 10*3/uL (ref 4.0–10.5)
nRBC: 0 % (ref 0.0–0.2)

## 2018-10-13 LAB — GLUCOSE, CAPILLARY
Glucose-Capillary: 105 mg/dL — ABNORMAL HIGH (ref 70–99)
Glucose-Capillary: 214 mg/dL — ABNORMAL HIGH (ref 70–99)
Glucose-Capillary: 141 mg/dL — ABNORMAL HIGH (ref 70–99)
Glucose-Capillary: 107 mg/dL — ABNORMAL HIGH (ref 70–99)

## 2018-10-13 LAB — BASIC METABOLIC PANEL
Anion gap: 11 (ref 5–15)
BUN: 47 mg/dL — ABNORMAL HIGH (ref 8–23)
CO2: 19 mmol/L — ABNORMAL LOW (ref 22–32)
Calcium: 8.3 mg/dL — ABNORMAL LOW (ref 8.9–10.3)
Chloride: 108 mmol/L (ref 98–111)
Creatinine, Ser: 4.52 mg/dL — ABNORMAL HIGH (ref 0.61–1.24)
GFR calc Af Amer: 14 mL/min — ABNORMAL LOW (ref >60)
GFR calc non Af Amer: 12 mL/min — ABNORMAL LOW (ref >60)
Glucose, Bld: 204 mg/dL — ABNORMAL HIGH (ref 70–99)
Potassium: 4.2 mmol/L (ref 3.5–5.1)
Sodium: 138 mmol/L (ref 135–145)

## 2018-10-13 MED ORDER — AMIODARONE HCL 200 MG PO TABS
400.0000 mg | ORAL_TABLET | Freq: Every day | ORAL | Status: DC
  Administered 2018-10-14 – 2018-10-21 (×7): 400 mg via ORAL
  Filled 2018-10-13 (×8): qty 2

## 2018-10-13 MED ORDER — ROSUVASTATIN CALCIUM 5 MG PO TABS
10.0000 mg | ORAL_TABLET | Freq: Every day | ORAL | Status: DC
  Administered 2018-10-13 – 2018-10-21 (×9): 10 mg via ORAL
  Filled 2018-10-13 (×10): qty 2

## 2018-10-13 MED ORDER — TECHNETIUM TC 99M PYROPHOSPHATE
19.2000 | Freq: Once | INTRAVENOUS | Status: AC
  Administered 2018-10-13: 19.2 via INTRAVENOUS

## 2018-10-13 NOTE — Progress Notes (Signed)
Discussed case with Dr.Meccariello this afternoon.  Patient has developed worsening renal function and his creatinine is now up to 4.52.  She states that she had a very long conversation with the patient and his family today.  The patient is adamant that he does not want dialysis.  His PYP scan came back as indeterminate for amyloidosis.  Unfortunately given his worsening renal function he is not a candidate for an MRI with contrast.  Etiology of ventricular tachycardia is still unclear.  His LV function is normal at 65 to 70% EF.  He has not had any episodes of chest pain but still cannot rule out underlying coronary ischemia as etiology of his VT.  He does not have any evidence of scar would not proceed with nuclear stress testing as he is not a candidate for cardiac cath which would likely result in end-stage renal disease and he is adamant he does not want to be placed on hemodialysis.  At this time would treat medically.  [facility] will sign off.   Medication Recommendations: Amiodarone 400 mg daily, amlodipine 10 mg daily, aspirin 81 mg daily, hydralazine 100 mg 3 times daily, Lopressor 150 mg twice daily, Crestor 10 mg daily Other recommendations (labs, testing, etc): None Follow up as an outpatient: Follow-up with Dr. Radford Pax or extender in 1 to 2 weeks in the office

## 2018-10-13 NOTE — Progress Notes (Signed)
Family Medicine Teaching Service Daily Progress Note Intern Pager: [phone]  Patient name: William [name] Medical record number: [id number] Date of birth: 06/02/1941 Age: 78 y.o. Gender: male  Primary Care Provider: Patient, No Pcp Per Consultants: None Code Status: DNR  Pt Overview and Major Events to Date:  10/04/2018-patient admitted for headache, fall  Assessment and Plan: William [name] is a 78 year old male presenting after a fall with a headache, found to have altered mental status, AKI, and hypertensive urgency.  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  This would be consistent with his renal picture as well. 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 Nephrology suspects dehydration in the setting of diarrhea and continued use of HCTZ and losartan.  Protein:creatinine ratio is elevated at 5.44.  Protein/creatinine ratio 5440 mg/g. nephrology ordered serum and urine protein electrophoresis.  Cr: 5.26>4.4>4.08>4.08>3.13>3.98(12/29)>3.98(12/30)>4.01(12/31)>4.03(1/1 AM)>3.92(1/1 PM).  Creatinine close to 4 and a GFR around 15 appears to be William [name].  Nephrology is now pursuing access/work-up for dialysis.  Per nephrology note, this was discussed with the patient and daughter on 12/31 who are inclined to pursue access. We would like to await the results of the cardiac imaging(see above) to move forward with vascular access as a diagnosis of amyloid would significantly change the prognosis. It would be helpful to have another William [name] discussion with family and palliative following the cardiac imaging  before taking further steps toward vascular access. -Nephrology following - Avoid nephrotoxic medications as possible, holding home losartan and HCTZ - Strict I&O  AMS likely 2/2 progressing dementia:   Moca done on 12/29 was 11/30.  CVA, infection, medications have been ruled out as possible causes to acutely worsen AMS.  Symptoms likely progression of dementia. -Follow-up with social work on SNF placement  - lunch time labs (potentially best mental status at lunch if he is sundowning)  Non-sustained ventricular tachycardia: Recurrent. Recurrent nonsustained runs of V. tach.  Could be 2/2 amyloidosis. -Monitor on telemetry -Continue metoprolol 150 mg twice daily --Amiodarone 200 mg BID load, per cards  Hypertensive, in setting of Chronic Hypertension, poorly controlled BP overnight Improved, SBP 140s, DBP 70s.   -Continue metoprolol 150 mg twice daily -Continue amlodipine 10 mg -Hydralazine 100 mg every TID   Chest pain: Resolved. Cardiology noted that his elevated troponins initially are likely related to his renal insufficiency.  No ischemic workup per cards as not candidate for cath. -Continue aspirin 81 mg -Continue rosuvastatin 10 mg  Type 2 Diabetes Mellitus: Chronic, stable.  A1c 6.3.  CBG average 90s to low 100s, no insulin given. -Sensitive SSI -CBGs before meals and nightly -Holding home Janumet and Januvia while inpatient (however per PCP docs, Janumet was D/C in 10/2017)  Previous complete heart block with pacemaker in place: Stable.  See chest pain assessment above.  EKG paced rhythm.  -Cardiology on board -Monitor vitals  Sacral ulcer, likely secondary to fungal infection Per wound care, leave dressings off of the affected area and apply antifungal powder every time patient is turned.  These instructions have been discussed with the patient. -Continue to monitor   CAD s/p PCI:  Chronic, stable See chest pain assessment as above. -  Continue home rosuvastatin  10 mg daily - cont Lopressor 150 mg twice daily, from 100 twice daily -Cont aspirin 81 mg for secondary prevention  FEN/GI: Carb modified diet Prophylaxis: Lovenox for decreased creatinine clearance  Disposition:  Discharge to SNF pending cardiac and renal work-up  Subjective:  Patient denies any chest pain at present.  Has no complaints.  He continues to be a oriented to person and year, but is unaware that he is in the hospital.  He continues to remember his medical history during conversation.  He said he has been eating well using the bathroom well.   Objective: Temp:  [97.5 F (36.4 C)-98.2 F (36.8 C)] 98 F (36.7 C) (01/02 0559) Pulse Rate:  [70-75] 70 (01/02 0729) Resp:  [18] 18 (01/02 0729) BP: (142-165)/(71-134) 143/71 (01/02 0729) SpO2:  [100 %] 100 % (01/02 0559) Weight:  [81.6 kg] 81.6 kg (01/01 2036)  Physical Exam: General: 78 y.o. male in NAD Cardio: Normal S1 and S2, S4 present Lungs: CTAB, no wheezing, no rhonchi, no crackles Abdomen: Soft, non-tender to palpation, positive bowel sounds Skin: warm and dry Extremities: No edema Neuro: A&O x2, not oriented to place, [place]  Laboratory: Recent Labs  Lab 10/10/18 1024 10/11/18 1322 10/12/18 1201  WBC 7.4 8.2 9.8  HGB 12.9* 12.5* 13.7  HCT 36.8* 36.7* 40.4  PLT PLATELET CLUMPS NOTED ON SMEAR, COUNT APPEARS ADEQUATE 346 316   Recent Labs  Lab 10/11/18 1147 10/12/18 1201 10/12/18 1607  NA 138 139 138  K 5.1 4.2 4.4  CL 112* 112* 111  CO2 16* 17* 17*  BUN 46* 49* 52*  CREATININE 4.01* 4.03* 3.92*  CALCIUM 8.3* 8.8* 8.8*  GLUCOSE 151* 145* 110*   Imaging/Diagnostic Tests: Dg Chest 2 View  Result Date: 10/04/2018 CLINICAL DATA:  Confusion, fatigue, rule out occult infection. Pt only c/o diarrhea to xray staff, stating he's had diarrhea for weeks. Hx of HTN, DM.confusion, fatigue, rule out occult infection EXAM: CHEST - 2 VIEW COMPARISON:  10/18/2017 FINDINGS:  LEFT-sided pacemaker overlies normal cardiac silhouette. No effusion, infiltrate, pneumothorax. Degenerative osteophytosis of the spine. IMPRESSION: No acute cardiopulmonary process. Electronically Signed   By: Suzy Bouchard M.D.   On: 10/04/2018 18:31   Ct Head Wo Contrast  Result Date: 10/04/2018 CLINICAL DATA:  fall, AMS, hx of brain surgeryAltered level of consciousness (LOC), unexplained fall, AMS, hx of brain surgery EXAM: CT HEAD WITHOUT CONTRAST TECHNIQUE: Contiguous axial images were obtained from the base of the skull through the vertex without intravenous contrast. COMPARISON:  None. FINDINGS: Brain: No acute intracranial hemorrhage. No focal mass lesion. No CT evidence of acute infarction. No midline shift or mass effect. No hydrocephalus. Basilar cisterns are patent. There are periventricular and subcortical white matter hypodensities. Generalized cortical atrophy. Vascular: No hyperdense vessel or unexpected calcification. Skull: Prior RIGHT craniotomy. Sinuses/Orbits: Orbits normal. Coastal thickening in the ethmoid sinuses Other: None IMPRESSION: 1. No acute intracranial findings. 2. Atrophy and white matter microvascular disease. Electronically Signed   By: Suzy Bouchard M.D.   On: 10/04/2018 19:56   US Renal  Result Date: 10/05/2018 CLINICAL DATA:  Acute onset of renal insufficiency. EXAM: RENAL / URINARY TRACT ULTRASOUND COMPLETE COMPARISON:  None. FINDINGS: Right Kidney: Renal measurements: 10.6 x 6.0 x 6.0 cm = volume: 202.9 mL. Increased parenchymal echogenicity is noted. A 1.5 cm cyst is noted at the interpole region of the right kidney. No hydronephrosis visualized. Left Kidney: Renal measurements: 9.6 x 4.9  x 5.2 cm = volume: 127.3 mL. Increased parenchymal echogenicity is noted. Cysts are noted at the lower pole and upper pole of the left kidney, measuring 3.2 cm and 1.5 cm in size. The larger cyst has a septation and question of a peripheral soft tissue component. No  hydronephrosis visualized. Bladder: Appears normal for degree of bladder distention. IMPRESSION: 1. No evidence of hydronephrosis. 2. Increased renal parenchymal echogenicity raises concern for medical renal disease. 3. Bilateral renal cysts. A somewhat complex 3.2 cm cystic lesion at the lower pole of the left kidney has a septation and question of a peripheral soft tissue component. Contrast-enhanced CT or MRI would be helpful for further evaluation, on an elective nonemergent basis. Electronically Signed   By: Garald Balding M.D.   On: 10/05/2018 00:40   Meccariello, Bernita Raisin, DO 10/13/2018, 8:31 AM PGY-1, [place] Intern pager: [phone], text pages welcome

## 2018-10-13 NOTE — Progress Notes (Signed)
[facility] Progress Note  Name: William [name] MRN: [id number] DOB: 1940-12-15  Subjective:  Pt with 1300 mL UOP charted over 1/1. He denies new complaints.  Daughter not bedside when I saw him.  Cardiology pursuing imaging for amyloid.   Review of systems: Denies shortness of breath or cough  No n/v No chest pain   Intake/Output Summary (Last 24 hours) at 10/13/2018 1229 Last data filed at 10/13/2018 1017 Gross per 24 hour  Intake 597 ml  Output 700 ml  Net -103 ml    Vitals:  Vitals:   10/12/18 1726 10/12/18 2036 10/13/18 0559 10/13/18 0729  BP: (!) 153/87 (!) 150/83 (!) 146/73 (!) 143/71  Pulse: 73 74 70 70  Resp: 18 18 18 18   Temp: (!) 97.5 F (36.4 C) 98.2 F (36.8 C) 98 F (36.7 C) 98 F (36.7 C)  TempSrc: Oral Oral  Oral  SpO2: 100% 100% 100%   Weight:  81.6 kg    Height:         Physical Exam:  General adult male eating lunch in bed, comfortable HEENT normocephalic atraumatic extraocular movements intact sclera anicteric Neck supple trachea midline Lungs clear and unlabored at rest Heart regular rate and rhythm no rubs or gallops appreciated Abdomen soft nontender nondistended Extremities no lower extremity edema Psych normal mood and affect Neuro has difficulty with completely participating in conversation due to memory.  Seems to confabulate some to engage in conversation   Medications reviewed   Labs:  BMP Latest Ref Rng & Units 10/12/2018 10/12/2018 10/11/2018  Glucose 70 - 99 mg/dL 110(H) 145(H) 151(H)  BUN 8 - 23 mg/dL 52(H) 49(H) 46(H)  Creatinine 0.61 - 1.24 mg/dL 3.92(H) 4.03(H) 4.01(H)  BUN/Creat Ratio 10 - 24 - - -  Sodium 135 - 145 mmol/L 138 139 138  Potassium 3.5 - 5.1 mmol/L 4.4 4.2 5.1  Chloride 98 - 111 mmol/L 111 112(H) 112(H)  CO2 22 - 32 mmol/L 17(L) 17(L) 16(L)  Calcium 8.9 - 10.3 mg/dL 8.8(L) 8.8(L) 8.3(L)     Assessment/Plan:  # AKI on CKD  - Pre-renal component setting of losartan/HCTZ as well as  diarrhea; improved some with hydration but renal function now hovering in the ~4 range. Cr 3 in 12/2017 at [facility].  This is most likely progression of already advanced CKD secondary to DM and HTN though amyloid ddx is being explored. At this time I do not think a kidney biopsy would be helpful given advanced underlying CKD.  - He has no acute need for dialysis but does have significantlyCKD - while w/u for amyloid is underway will hold on c/s for vascular access as GOC may change - will need nephrology f/u post discharge  # HTN with CKD  - Improved on hydralazine 100 TID - Holding losartan and HCTZ.  Would not resume either  # Proteinuria  - SPEP neg M spike and UPEP pending. HbA1c 6.5 - Likely 2/2 diabetes   # Diarrhea: resolving  # Metabolic acidosis - 2/2 AoCKD as well as diarrhea; hydration - Increase supplemental bicarbonate to 1300 BID on 1/1  # Renal cyst - complex  - Non-emergent follow-up imaging needed (CT vs MRI recommended, but with advanced CKD neither is good option)   # AMS  - Note daughter is pursuing POA status as he has had a decline in his functional status and higher level skills such as bill paying - Multifactorial  - statin d/cd per family request   Ria Comment  Orland Mustard, MD 10/13/2018 12:29 PM

## 2018-10-14 ENCOUNTER — Telehealth: Payer: Self-pay | Admitting: Cardiology

## 2018-10-14 DIAGNOSIS — Z7189 Other specified counseling: Secondary | ICD-10-CM

## 2018-10-14 DIAGNOSIS — Z515 Encounter for palliative care: Secondary | ICD-10-CM

## 2018-10-14 LAB — BASIC METABOLIC PANEL
Anion gap: 8 (ref 5–15)
BUN: 47 mg/dL — ABNORMAL HIGH (ref 8–23)
CO2: 21 mmol/L — ABNORMAL LOW (ref 22–32)
Calcium: 8.5 mg/dL — ABNORMAL LOW (ref 8.9–10.3)
Chloride: 109 mmol/L (ref 98–111)
Creatinine, Ser: 4.74 mg/dL — ABNORMAL HIGH (ref 0.61–1.24)
GFR calc Af Amer: 13 mL/min — ABNORMAL LOW (ref >60)
GFR calc non Af Amer: 11 mL/min — ABNORMAL LOW (ref >60)
Glucose, Bld: 163 mg/dL — ABNORMAL HIGH (ref 70–99)
Potassium: 3.7 mmol/L (ref 3.5–5.1)
Sodium: 138 mmol/L (ref 135–145)

## 2018-10-14 LAB — GLUCOSE, CAPILLARY
Glucose-Capillary: 94 mg/dL (ref 70–99)
Glucose-Capillary: 121 mg/dL — ABNORMAL HIGH (ref 70–99)
Glucose-Capillary: 204 mg/dL — ABNORMAL HIGH (ref 70–99)
Glucose-Capillary: 104 mg/dL — ABNORMAL HIGH (ref 70–99)

## 2018-10-14 LAB — CBC
HCT: 37 % — ABNORMAL LOW (ref 39.0–52.0)
Hemoglobin: 12.4 g/dL — ABNORMAL LOW (ref 13.0–17.0)
MCH: 32.5 pg (ref 26.0–34.0)
MCHC: 33.5 g/dL (ref 30.0–36.0)
MCV: 97.1 fL (ref 80.0–100.0)
Platelets: 372 10*3/uL (ref 150–400)
RBC: 3.81 MIL/uL — ABNORMAL LOW (ref 4.22–5.81)
RDW: 13.1 % (ref 11.5–15.5)
WBC: 9.3 10*3/uL (ref 4.0–10.5)
nRBC: 0 % (ref 0.0–0.2)

## 2018-10-14 LAB — PROTEIN ELECTRO, RANDOM URINE
Albumin ELP, Urine: 60.8 %
Alpha-1-Globulin, U: 4.1 %
Alpha-2-Globulin, U: 8 %
Beta Globulin, U: 14 %
Gamma Globulin, U: 13.1 %
Total Protein, Urine: 674.8 mg/dL

## 2018-10-14 NOTE — Clinical Social Work Note (Addendum)
CSW continuing to follow patient's progress. Insurance authorization initiated with [employer] today by admissions Mudlogger at [facility] [place]. Will continue to monitor patient's progress, provide SW intervention services as needed and facilitate discharge to [facility] once medically stable and authorization received.  Kyler Lerette Givens, MSW, LCSW Licensed Clinical Social Worker [place] [phone]

## 2018-10-14 NOTE — Telephone Encounter (Signed)
LVM for patient to return call to schedule TOC appt with either Rosita Fire or [place]. See note below.

## 2018-10-14 NOTE — Care Management Important Message (Signed)
Important Message  Patient Details  Name: William [name] MRN: [id number] Date of Birth: December 08, 1940   Medicare Important Message Given:  Yes    [name] 10/14/2018, 3:09 PM
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
## 2018-10-14 NOTE — Telephone Encounter (Signed)
The pt has not been discharged from the hospital yet. We will call Monday 10/17/2018.
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
## 2018-10-14 NOTE — Progress Notes (Signed)
Family Medicine Teaching Service Daily Progress Note Intern Pager: [phone]  Patient name: William [name] Medical record number: [id number] Date of birth: [birth year] Age: 79 y.o. Gender: male  Primary Care Provider: Patient, No Pcp Per Consultants: None Code Status: DNR  Pt Overview and Major Events to Date:  10/04/2018-patient admitted for headache, fall  Assessment and Plan: William [name] is a 78 year old male presenting after a fall with a headache, found to have altered mental status, AKI, and hypertensive urgency.  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  Per cards, if patient does not want HD, cannot do cath as would make him ESRD.  Placed on Amiodarone 400mg  QD and will f/u with Dr. Radford Pax as outpatient in 1-2 weeks.   - cont amiodarone - f/u Dr. 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  Plan for palliative to see patient again today to discuss goals of care and capacity to ensure that will not proceed with HD.  -Nephrology following - Avoid nephrotoxic medications as possible, holding home losartan and HCTZ - Strict I&O  AMS likely 2/2 progressing dementia:   Moca done on 12/29 was 11/30.  Remains able to discuss medical care and remembers previous conversations.  Palliative to assess capacity today. -Follow-up with social work on SNF placement   Non-sustained ventricular tachycardia: Recurrent. Recurrent nonsustained runs of V. tach.  Still unclear cause, but no further cardiac workup at this time as patient does not want HD and cath would cause patient to become ESRD -Monitor on telemetry -Continue metoprolol 150 mg twice  daily --cont amiodarone 400mg  QD - f/u cards as outpatient  Hypertensive, in setting of Chronic Hypertension, poorly controlled BP 144-159/79-83. -Continue metoprolol 150 mg twice daily -Continue amlodipine 10 mg -Cont Hydralazine 100 mg every TID   Chest pain: Resolved. Cardiology noted that his elevated troponins initially are likely related to his renal insufficiency.  No ischemic workup per cards as not candidate for cath given does not want HD.  -Continue aspirin 81 mg -Continue rosuvastatin 10 mg  Type 2 Diabetes Mellitus: Chronic, stable.  A1c 6.3.  CBGs remain 90s-low 100s. -Sensitive SSI -CBGs before meals and nightly -Holding home Janumet and Januvia while inpatient (however per PCP docs, Janumet was D/C in 10/2017)  Previous complete heart block with pacemaker in place: Stable.  See chest pain assessment above.  EKG paced rhythm.  -Cardiology f/u outpatient -Monitor vitals  Sacral ulcer, likely secondary to fungal infection Per wound care, leave dressings off of the affected area and apply antifungal powder every time patient is turned.  These instructions have been discussed with the patient. -Continue to monitor   CAD s/p PCI:  Chronic, stable See chest pain assessment as above. -Continue home rosuvastatin 10 mg daily - cont Lopressor 150 mg twice daily, from 100 twice daily -Cont aspirin 81 mg for secondary prevention  FEN/GI: Carb modified diet Prophylaxis: Lovenox for decreased creatinine clearance  Disposition:  Discharge to SNF pending palliative discussion, then will speak with nephro  Subjective:  Patient feeling well this AM.  No complaints.  No CP, SOB.   Objective: Temp:  [98 F (36.7 C)-99.6 F (37.6 C)] 98 F (36.7 C) (  01/03 0431) Pulse Rate:  [66-72] 71 (01/03 0431) Resp:  [15-19] 18 (01/03 0431) BP: (139-159)/(65-83) 144/83 (01/03 0431) SpO2:  [98 %-100 %] 100 % (01/03 0431)  Physical Exam: General: 78 y.o. male in NAD sitting up in  chair Cardio: RRR no m/r/g Lungs: CTAB, no wheezing, no rhonchi, no crackles Abdomen: Soft, non-tender to palpation, positive bowel sounds Skin: warm and dry Extremities: No edema Psych: A&Ox2, does not know location  Laboratory: Recent Labs  Lab 10/11/18 1322 10/12/18 1201 10/13/18 1528  WBC 8.2 9.8 8.8  HGB 12.5* 13.7 11.2*  HCT 36.7* 40.4 32.6*  PLT 346 316 PLATELET CLUMPS NOTED ON SMEAR, UNABLE TO ESTIMATE   Recent Labs  Lab 10/12/18 1201 10/12/18 1607 10/13/18 1528  NA 139 138 138  K 4.2 4.4 4.2  CL 112* 111 108  CO2 17* 17* 19*  BUN 49* 52* 47*  CREATININE 4.03* 3.92* 4.52*  CALCIUM 8.8* 8.8* 8.3*  GLUCOSE 145* 110* 204*   Imaging/Diagnostic Tests: Dg Chest 2 View  Result Date: 10/04/2018 CLINICAL DATA:  Confusion, fatigue, rule out occult infection. Pt only c/o diarrhea to xray staff, stating he's had diarrhea for weeks. Hx of HTN, DM.confusion, fatigue, rule out occult infection EXAM: CHEST - 2 VIEW COMPARISON:  10/18/2017 FINDINGS: LEFT-sided pacemaker overlies normal cardiac silhouette. No effusion, infiltrate, pneumothorax. Degenerative osteophytosis of the spine. IMPRESSION: No acute cardiopulmonary process. Electronically Signed   By: Suzy Bouchard M.D.   On: 10/04/2018 18:31   Ct Head Wo Contrast  Result Date: 10/04/2018 CLINICAL DATA:  fall, AMS, hx of brain surgeryAltered level of consciousness (LOC), unexplained fall, AMS, hx of brain surgery EXAM: CT HEAD WITHOUT CONTRAST TECHNIQUE: Contiguous axial images were obtained from the base of the skull through the vertex without intravenous contrast. COMPARISON:  None. FINDINGS: Brain: No acute intracranial hemorrhage. No focal mass lesion. No CT evidence of acute infarction. No midline shift or mass effect. No hydrocephalus. Basilar cisterns are patent. There are periventricular and subcortical white matter hypodensities. Generalized cortical atrophy. Vascular: No hyperdense vessel or unexpected  calcification. Skull: Prior RIGHT craniotomy. Sinuses/Orbits: Orbits normal. Coastal thickening in the ethmoid sinuses Other: None IMPRESSION: 1. No acute intracranial findings. 2. Atrophy and white matter microvascular disease. Electronically Signed   By: Suzy Bouchard M.D.   On: 10/04/2018 19:56   Nm Tumor Localization W Spect  Result Date: 10/13/2018 CLINICAL DATA:  HEART FAILURE. 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 EXAM: NUCLEAR MEDICINE TUMOR LOCALIZATION. PYP CARDIAC AMYLOIDOSIS SCAN WITH SPECT TECHNIQUE: Following intravenous administration of radiopharmaceutical, anterior planar images of the chest were obtained. Regions of interest were placed on the heart and contralateral chest wall for quantitative assessment. Additional SPECT imaging of the chest was obtained. RADIOPHARMACEUTICALS:  19.2 mCi TECHNETIUM 99 PYROPHOSPHATE FINDINGS: Planar Visual assessment: Anterior planar imaging demonstrates radiotracer uptake within the heart less than than uptake within the adjacent ribs (Grade 0). Quantitative assessment : Quantitative assessment of the cardiac uptake compared to the contralateral chest wall is equal to (H/CL = 1.39). SPECT assessment: SPECT imaging of the chest demonstrates mild radiotracer accumulation within the LEFT ventricle. IMPRESSION: Visual and quantitative assessment (grade 0, H/CLL equal 1.3) are equivocal for transthyretin amyloidosis. Electronically Signed   By: Kerby Moors M.D.   On: 10/13/2018 13:01   US Renal  Result Date: 10/05/2018 CLINICAL DATA:  Acute onset of renal insufficiency. EXAM: RENAL / URINARY TRACT ULTRASOUND COMPLETE COMPARISON:  None. FINDINGS: Right Kidney: Renal measurements: 10.6 x 6.0 x 6.0 cm = volume:  202.9 mL. Increased parenchymal echogenicity is noted. A 1.5 cm cyst is noted at the interpole region of the right kidney. No hydronephrosis visualized. Left Kidney: Renal measurements: 9.6 x 4.9 x 5.2 cm = volume: 127.3 mL. Increased parenchymal  echogenicity is noted. Cysts are noted at the lower pole and upper pole of the left kidney, measuring 3.2 cm and 1.5 cm in size. The larger cyst has a septation and question of a peripheral soft tissue component. No hydronephrosis visualized. Bladder: Appears normal for degree of bladder distention. IMPRESSION: 1. No evidence of hydronephrosis. 2. Increased renal parenchymal echogenicity raises concern for medical renal disease. 3. Bilateral renal cysts. A somewhat complex 3.2 cm cystic lesion at the lower pole of the left kidney has a septation and question of a peripheral soft tissue component. Contrast-enhanced CT or MRI would be helpful for further evaluation, on an elective nonemergent basis. Electronically Signed   By: Garald Balding M.D.   On: 10/05/2018 00:40   , Bernita Raisin, DO 10/14/2018, 8:33 AM PGY-1, [place] Intern pager: [phone], text pages welcome

## 2018-10-14 NOTE — Progress Notes (Signed)
PT Cancellation Note  Patient Details Name: William [name] MRN: [id number] DOB: 1941-07-05   Cancelled Treatment:    Reason Eval/Treat Not Completed: Patient declined, no reason specified.   Attempted PT treatment session this afternoon and pt appears very internally distracted. Not willing to participate with therapy at this time, stating he has a big decision to make regarding dialysis. Perseverating on being able to "smell" someone who is on dialysis and all attempts to redirect eventually circle back to this thought. Family present initially but stepped out when PT arrived. Will continue to follow.   Thelma Comp  10/14/2018, 2:00 PM  Rolinda Roan, PT, DPT Acute Rehabilitation Services Pager: [phone] Office: [phone]

## 2018-10-14 NOTE — Telephone Encounter (Signed)
TOC 10/31/18 @ 10am [name]

## 2018-10-14 NOTE — Progress Notes (Signed)
Daily Progress Note   Patient Name: William [name]       Date: 10/14/2018 DOB: 1941/06/04  Age: 78 y.o. MRN#: [id number] Attending Physician: Lind Covert, MD Primary Care Physician: Patient, No Pcp Per Admit Date: 10/04/2018  Reason for Consultation/Follow-up: Establishing goals of care and Psychosocial/spiritual support  Subjective: Patient seen, chart reviewed.  Asked to reconvene with family by attending to assess patient's willingness regarding hemodialysis.  Spoke to patient's daughter [name] and her sister Rise Paganini, brother [place] via phone.  We also were joined by nephrology Dr. Johnney Ou.   Patient has been sharing that he would not want to pursue hemodialysis.  Per family, he has friends that have had difficulty with hemodialysis and he has verbalized that he would not want to go through that.  Patient now diagnosed with amyloidosis cardiac disease.  Going forward with a catheterization would make his renal function worse thus circling back to patient's feelings about hemodialysis.  His creatinine today is 4.74  Per nephrology, he is not in emergent need of dialysis and could establish with nephrology in the community for further medical management and monitoring.  Discussed CODE STATUS with patient's daughters.  Defined full code as well as DNR.  He wishes to remain a full code at present and continue to have discussions regarding goals of care  Length of Stay: 10  Current Medications: Scheduled Meds:  . amiodarone  400 mg Oral Daily  . amLODipine  10 mg Oral Daily  . aspirin EC  81 mg Oral Daily  . calcium carbonate  1 tablet Oral Once  . feeding supplement (ENSURE ENLIVE)  237 mL Oral BID BM  . heparin  5,000 Units Subcutaneous Q8H  . hydrALAZINE  100 mg Oral TID  .  insulin aspart  0-9 Units Subcutaneous TID WC  . metoprolol tartrate  150 mg Oral BID  . pantoprazole  20 mg Oral Daily  . rosuvastatin  10 mg Oral q1800  . sodium bicarbonate  1,300 mg Oral BID    Continuous Infusions:   PRN Meds: acetaminophen **OR** acetaminophen  Physical Exam Vitals signs and nursing note reviewed.  Constitutional:      Appearance: Normal appearance.     Comments: Mild confusion  HENT:     Head: Normocephalic and atraumatic.  Neck:  Musculoskeletal: Normal range of motion.  Pulmonary:     Effort: Pulmonary effort is normal.  Neurological:     General: No focal deficit present.     Mental Status: He is alert.     Comments: Oriented to self and knows he is in the hospital Short-term memory deficits present  Psychiatric:     Comments: No overt agitation otherwise unable to test             Vital Signs: BP (!) 143/70 (BP Location: Right Arm)   Pulse 72   Temp 97.8 F (36.6 C) (Oral)   Resp 20   Ht 5\' 7"  (1.702 m)   Wt 81.6 kg   SpO2 100%   BMI 28.18 kg/m  SpO2: SpO2: 100 % O2 Device: O2 Device: Room Air O2 Flow Rate:    Intake/output summary:   Intake/Output Summary (Last 24 hours) at 10/14/2018 1635 Last data filed at 10/14/2018 1500 Gross per 24 hour  Intake 900 ml  Output 500 ml  Net 400 ml   LBM: Last BM Date: 10/12/18 Baseline Weight: Weight: 81.6 kg Most recent weight: Weight: 81.6 kg       Palliative Assessment/Data:    Flowsheet Rows     Most Recent Value  Intake Tab  Unit at Time of Referral  Intermediate Care Unit  Palliative Care Primary Diagnosis  Other (Comment) [CKD]  Date Notified  10/07/18  Palliative Care Type  New Palliative care  Reason for referral  Clarify Goals of Care  Date of Admission  10/04/18  Date first seen by Palliative Care  10/08/18  # of days Palliative referral response time  1 Day(s)  # of days IP prior to Palliative referral  3  Clinical Assessment  Psychosocial & Spiritual Assessment    Palliative Care Outcomes      Patient Active Problem List   Diagnosis Date Noted  . Chronic kidney disease (CKD), stage IV (severe) ([place])   . Palliative care encounter   . Diarrhea   . Dehydration   . Dementia without behavioral disturbance ([place])   . Hypertensive urgency   . Altered mental status   . AKI (acute kidney injury) ([place]) 10/04/2018  . Type 2 diabetes mellitus without complication, without long-term current use of insulin ([place]) 10/30/2017  . Benign essential HTN 10/30/2017  . Hyperlipidemia 10/30/2017    Palliative Care Assessment & Plan   Patient Profile:  78 y.o. male  with past medical history of CKD, diabetes, hypertension, CAD status post PCI, history of complete heart block status post pacemaker and probable dementia, who was admitted on 10/04/2018 with altered mental status and found to have acute on chronic kidney disease.  Renal failure was thought secondary to dehydration and he diarrhea.  Patient's renal function has improved with conservative management.  However, CKD remains stage IV.    Palliative care was consulted to help address goals of care with family.  Per nephrology, no emergent need for dialysis.  Recommendations/Plan:  Recommend ongoing palliative care, goals of care discussions.  Patient will discharge to SNF for brief rehab and palliative medicine in the community could follow him at the facility.  Those resources can be accessed through either care connection at [phone] or hospice and palliative care of [facility]'s palliative medicine division at [phone]  Hard Choices for [place] shared with daughters as well as MOST forms  Remains a full code by choice  Goals of Care and Additional Recommendations:  Limitations on Scope of  Treatment: Full Scope Treatment  Code Status:    Code Status Orders  (From admission, onward)         Start     Ordered   10/06/18 0219  Full code  Continuous     10/06/18 0219         Code Status History    Date Active Date Inactive Code Status Order ID Comments User Context   10/04/2018 2127 10/06/2018 0219 DNR [id number]  Kathrene Alu, MD ED    Advance Directive Documentation     Most Recent Value  Type of Advance Directive  Living will  Pre-existing out of facility DNR order (yellow form or pink MOST form)  -  "MOST" Form in Place?  -       Prognosis:   Unable to determine  Discharge Planning:  [place] for rehab with Palliative care service follow-up  Care plan was discussed with Dr. Johnney Ou  Thank you for allowing the Palliative Medicine Team to assist in the care of this patient.   Time In: 1330 Time Out: 1415 Total Time 45 min Prolonged Time Billed  no       Greater than 50%  of this time was spent counseling and coordinating care related to the above assessment and plan.  Dory Horn, NP  Please contact Palliative Medicine Team phone at [phone] for questions and concerns.

## 2018-10-14 NOTE — Telephone Encounter (Signed)
-----   Message from [place], [place] sent at 10/13/2018  5:36 PM EST ----- Regarding: schedule 7-10 day TOC followup Please contact the patient to schedule 7-10 day TOC followup with Dr. Radford Pax or APP.   Hilbert Corrigan PA Pager: [phone]

## 2018-10-15 LAB — RENAL FUNCTION PANEL
Albumin: 2.6 g/dL — ABNORMAL LOW (ref 3.5–5.0)
Anion gap: 7 (ref 5–15)
BUN: 45 mg/dL — ABNORMAL HIGH (ref 8–23)
CO2: 24 mmol/L (ref 22–32)
Calcium: 8.9 mg/dL (ref 8.9–10.3)
Chloride: 108 mmol/L (ref 98–111)
Creatinine, Ser: 4.86 mg/dL — ABNORMAL HIGH (ref 0.61–1.24)
GFR calc Af Amer: 12 mL/min — ABNORMAL LOW (ref >60)
GFR calc non Af Amer: 11 mL/min — ABNORMAL LOW (ref >60)
Glucose, Bld: 109 mg/dL — ABNORMAL HIGH (ref 70–99)
Phosphorus: 3 mg/dL (ref 2.5–4.6)
Potassium: 4.1 mmol/L (ref 3.5–5.1)
Sodium: 139 mmol/L (ref 135–145)

## 2018-10-15 LAB — CBC
HCT: 35.5 % — ABNORMAL LOW (ref 39.0–52.0)
Hemoglobin: 11.8 g/dL — ABNORMAL LOW (ref 13.0–17.0)
MCH: 32 pg (ref 26.0–34.0)
MCHC: 33.2 g/dL (ref 30.0–36.0)
MCV: 96.2 fL (ref 80.0–100.0)
Platelets: 342 10*3/uL (ref 150–400)
RBC: 3.69 MIL/uL — ABNORMAL LOW (ref 4.22–5.81)
RDW: 12.8 % (ref 11.5–15.5)
WBC: 8.5 10*3/uL (ref 4.0–10.5)
nRBC: 0 % (ref 0.0–0.2)

## 2018-10-15 LAB — GLUCOSE, CAPILLARY
Glucose-Capillary: 95 mg/dL (ref 70–99)
Glucose-Capillary: 117 mg/dL — ABNORMAL HIGH (ref 70–99)
Glucose-Capillary: 169 mg/dL — ABNORMAL HIGH (ref 70–99)
Glucose-Capillary: 93 mg/dL (ref 70–99)

## 2018-10-15 NOTE — Progress Notes (Addendum)
Family Medicine Teaching Service Daily Progress Note Intern Pager: [phone]  Patient name: William [name] Medical record number: [id number] Date of birth: 11/10/1940 Age: 78 y.o. Gender: male  Primary Care Provider: Patient, No Pcp Per Consultants: None Code Status: Full  Pt Overview and Major Events to Date:  12/24: patient admitted for headache, fall, hypertensive emergency 01/03: meeting with nephro and palliative, decision to f/u outpt and not proceed with HD  Assessment and Plan: William [name] is a 78 year old male presenting after a fall with a headache, found to have altered mental status, AKI, and hypertensive urgency.  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  AKI on CKD stage IV:  Patient continually expresses desire to not pursue HD despite Nephrology recommendation. This has been discussed with palliative along with the family.  Decision has been made to follow-up outpatient with nephrology.  No signs of kidney need for emergent dialysis. - Nephrology following, rec OP f/u - Avoid nephrotoxic medications as possible, holding home losartan and HCTZ - Strict I&O  CAD  Left ventricular hypertrophy: Chronic. T-99 inconclusive.  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 History of complete heart block with pacemaker in place. Troponin elevation related to CKD per cards. Per cards, if patient does not want HD, cannot do cath as would make him ESRD.  Placed on Amiodarone 400mg  QD and will f/u with Dr. Radford Pax as outpatient in 1-2 weeks.  Continues to deny CP, SOB. - Telemetry - Continue Lopressor 150 mg BID, amlodipine 10 mg QD, and Hydralazine 100 mg TID - Continue amiodarone 400mg  QD - Follow up with Dr. 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  Remains able to discuss medical care and remembers previous conversations.   - Follow-up  with social work on SNF placement   Hypertension Stable. BP 790W systolically, controlled with amlodipine, hydral, lopressor. Holding home losartan, HCTZ. - continue as above  Non-sustained ventricular tachycardia: Resolved. Had episodes of recurrent nonsustained runs of V. tach.  Still unclear cause, but no further cardiac workup at this time as patient does not want HD and cath would cause patient to become ESRD - See plan for LVH  Type 2 Diabetes Mellitus: Chronic, stable. A1c 6.3.  CBGs controlled. Received 2u aspart last 24 hrs. - Sensitive SSI - CBGs before meals and nightly - Holding home Janumet and Januvia while inpatient (however per PCP docs, Janumet was D/C in 10/2017). Would likely not continue janumet on d/c given CKD  Sacral ulcer secondary to fungal infection: Per wound care, leave dressings off of the affected area and apply antifungal powder every time patient is turned.  These instructions have been discussed with the patient. - Continue to monitor   FEN/GI: Carb modified diet Prophylaxis: Lovenox for decreased creatinine clearance  Disposition:  Patient stable for discharge to SNF following palliative recommendations with plans to follow-up with nephrology outpatient   Subjective:  Patient feeling well this am. Denies CP, SOB, difficulties eating or drinking.   Objective: Temp:  [98.4 F (36.9 C)-99.1 F (37.3 C)] 99.1 F (37.3 C) (01/04 2108) Pulse Rate:  [72-80] 75 (01/04 2108) Resp:  [16-18] 16 (01/04 2108) BP: (149-179)/(60-91) 149/60 (01/04 2108) SpO2:  [96 %-99 %] 98 % (01/04 2108)  Physical Exam: General: pleasant male, lying in bed, in NAD Cardiovascular: RRR, no m/r/g Lungs: CTAB, normal WOB, appropriately saturated on RA Abdomen:  soft, NTND, +BS Extremities: warm and well perfused, no LE edema  Laboratory: Recent Labs  Lab 10/13/18 1528 10/14/18 1106 10/15/18 0955  WBC 8.8 9.3 8.5  HGB 11.2* 12.4* 11.8*  HCT 32.6* 37.0* 35.5*  PLT  PLATELET CLUMPS NOTED ON SMEAR, UNABLE TO ESTIMATE 372 342   Recent Labs  Lab 10/13/18 1528 10/14/18 0820 10/15/18 0714  NA 138 138 139  K 4.2 3.7 4.1  CL 108 109 108  CO2 19* 21* 24  BUN 47* 47* 45*  CREATININE 4.52* 4.74* 4.86*  CALCIUM 8.3* 8.5* 8.9  GLUCOSE 204* 163* 109*   Imaging/Diagnostic Tests: Dg Chest 2 View  Result Date: 10/04/2018 CLINICAL DATA:  Confusion, fatigue, rule out occult infection. Pt only c/o diarrhea to xray staff, stating he's had diarrhea for weeks. Hx of HTN, DM.confusion, fatigue, rule out occult infection EXAM: CHEST - 2 VIEW COMPARISON:  10/18/2017 FINDINGS: LEFT-sided pacemaker overlies normal cardiac silhouette. No effusion, infiltrate, pneumothorax. Degenerative osteophytosis of the spine. IMPRESSION: No acute cardiopulmonary process. Electronically Signed   By: Suzy Bouchard M.D.   On: 10/04/2018 18:31   Ct Head Wo Contrast  Result Date: 10/04/2018 CLINICAL DATA:  fall, AMS, hx of brain surgeryAltered level of consciousness (LOC), unexplained fall, AMS, hx of brain surgery EXAM: CT HEAD WITHOUT CONTRAST TECHNIQUE: Contiguous axial images were obtained from the base of the skull through the vertex without intravenous contrast. COMPARISON:  None. FINDINGS: Brain: No acute intracranial hemorrhage. No focal mass lesion. No CT evidence of acute infarction. No midline shift or mass effect. No hydrocephalus. Basilar cisterns are patent. There are periventricular and subcortical white matter hypodensities. Generalized cortical atrophy. Vascular: No hyperdense vessel or unexpected calcification. Skull: Prior RIGHT craniotomy. Sinuses/Orbits: Orbits normal. Coastal thickening in the ethmoid sinuses Other: None IMPRESSION: 1. No acute intracranial findings. 2. Atrophy and white matter microvascular disease. Electronically Signed   By: Suzy Bouchard M.D.   On: 10/04/2018 19:56   Nm Tumor Localization W Spect  Result Date: 10/13/2018 CLINICAL DATA:  HEART  FAILURE. 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 EXAM: NUCLEAR MEDICINE TUMOR LOCALIZATION. PYP CARDIAC AMYLOIDOSIS SCAN WITH SPECT TECHNIQUE: Following intravenous administration of radiopharmaceutical, anterior planar images of the chest were obtained. Regions of interest were placed on the heart and contralateral chest wall for quantitative assessment. Additional SPECT imaging of the chest was obtained. RADIOPHARMACEUTICALS:  19.2 mCi TECHNETIUM 99 PYROPHOSPHATE FINDINGS: Planar Visual assessment: Anterior planar imaging demonstrates radiotracer uptake within the heart less than than uptake within the adjacent ribs (Grade 0). Quantitative assessment : Quantitative assessment of the cardiac uptake compared to the contralateral chest wall is equal to (H/CL = 1.39). SPECT assessment: SPECT imaging of the chest demonstrates mild radiotracer accumulation within the LEFT ventricle. IMPRESSION: Visual and quantitative assessment (grade 0, H/CLL equal 1.3) are equivocal for transthyretin amyloidosis. Electronically Signed   By: Kerby Moors M.D.   On: 10/13/2018 13:01   US Renal  Result Date: 10/05/2018 CLINICAL DATA:  Acute onset of renal insufficiency. EXAM: RENAL / URINARY TRACT ULTRASOUND COMPLETE COMPARISON:  None. FINDINGS: Right Kidney: Renal measurements: 10.6 x 6.0 x 6.0 cm = volume: 202.9 mL. Increased parenchymal echogenicity is noted. A 1.5 cm cyst is noted at the interpole region of the right kidney. No hydronephrosis visualized. Left Kidney: Renal measurements: 9.6 x 4.9 x 5.2 cm = volume: 127.3 mL. Increased parenchymal echogenicity is noted. Cysts are noted at the lower pole and upper pole of the left kidney, measuring 3.2 cm and 1.5 cm in  size. The larger cyst has a septation and question of a peripheral soft tissue component. No hydronephrosis visualized. Bladder: Appears normal for degree of bladder distention. IMPRESSION: 1. No evidence of hydronephrosis. 2. Increased renal parenchymal echogenicity  raises concern for medical renal disease. 3. Bilateral renal cysts. A somewhat complex 3.2 cm cystic lesion at the lower pole of the left kidney has a septation and question of a peripheral soft tissue component. Contrast-enhanced CT or MRI would be helpful for further evaluation, on an elective nonemergent basis. Electronically Signed   By: Garald Balding M.D.   On: 10/05/2018 00:40    Rory Percy, DO 10/15/2018, 11:23 PM PGY-2, [place] Intern pager: [phone], text pages welcome

## 2018-10-15 NOTE — Progress Notes (Signed)
[facility] Progress Note  Name: William [name] MRN: [id number] DOB: 09-Jun-1941  Subjective:  Pt with 1850 mL UOP charted over 1/2. He denies new complaints.   Met for family meeting with palliative care.    Review of systems: Denies shortness of breath or cough  No n/v No chest pain   Intake/Output Summary (Last 24 hours) at 10/15/2018 1037 Last data filed at 10/15/2018 0859 Gross per 24 hour  Intake 300 ml  Output 1375 ml  Net -1075 ml    Vitals:  Vitals:   10/14/18 1603 10/14/18 2042 10/15/18 0515 10/15/18 0908  BP: (!) 143/70 (!) 179/99 (!) 150/80 (!) 179/91  Pulse: 72 76 80 72  Resp: 20 18 18 18   Temp: 97.8 F (36.6 C) 98.6 F (37 C) 98.4 F (36.9 C)   TempSrc: Oral  Oral   SpO2: 100% 98% 99% 98%  Weight:      Height:         Physical Exam:  General adult male in bed, comfortable HEENT normocephalic atraumatic extraocular movements intact sclera anicteric Neck supple trachea midline Lungs clear and unlabored at rest Heart regular rate and rhythm no rubs or gallops appreciated Abdomen soft nontender nondistended Extremities no lower extremity edema Psych normal mood and affect Neuro has difficulty with completely participating in conversation due to memory.  Seems to confabulate some to engage in conversation   Medications reviewed   Labs:  BMP Latest Ref Rng & Units 10/15/2018 10/14/2018 10/13/2018  Glucose 70 - 99 mg/dL 109(H) 163(H) 204(H)  BUN 8 - 23 mg/dL 45(H) 47(H) 47(H)  Creatinine 0.61 - 1.24 mg/dL 4.86(H) 4.74(H) 4.52(H)  BUN/Creat Ratio 10 - 24 - - -  Sodium 135 - 145 mmol/L 139 138 138  Potassium 3.5 - 5.1 mmol/L 4.1 3.7 4.2  Chloride 98 - 111 mmol/L 108 109 108  CO2 22 - 32 mmol/L 24 21(L) 19(L)  Calcium 8.9 - 10.3 mg/dL 8.9 8.5(L) 8.3(L)     Assessment/Plan:  # AKI on CKD:  Baseline Cr 3 as of 12/2017, admitted with Cr 5.26 in the setting of diarrhea.  Now Cr hovering in the 4s which is thought to most likely represent  progression of his CKD.  He has been expressing strong desire to not have dialysis treatments and wishes to return home.  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  I do not think his cognitive difficulties are from uremia.  Even should he desire dialysis it is not currently indicated and I have explained this to family.    Family wishes to honor his wishes and plans no dialysis in the future.  -I think conservative management of CKD is very reasonable.  - will need nephrology f/u post discharge > will plan to see him in clinic in about 4 weeks after discharge.  I will arrange.  Family in agreement. -nephroprotective measures > care of DM, HTN, avoid hypovolemia, avoid nephrotoxins including NSAIDs and iodinated contrast.   # HTN with CKD  - Improved on hydralazine 100 TID -Also on amlodipine 10, metoprolol 150 BID - Holding losartan and HCTZ.  Would not resume either  # Proteinuria  - SPEP neg M spike and UPEP pending. 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

## 2018-10-15 NOTE — Progress Notes (Signed)
Family Medicine Teaching Service Daily Progress Note Intern Pager: [phone]  Patient name: William [name] Medical record number: [id number] Date of birth: December 03, 1940 Age: 78 y.o. Gender: male  Primary Care Provider: Patient, No Pcp Per Consultants: None Code Status: DNR  Pt Overview and Major Events to Date:  12/24: patient admitted for headache, fall 01/03: meeting with nephro and palliative, decision to f/u op and not proceed with HD  Assessment and Plan: Rael Yo is a 78 year old male presenting after a fall with a headache, found to have altered mental status, AKI, and hypertensive urgency.  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  AKI on CKD stage IV:  Nephrology recommending HD due to declining renal function, however patient has made it clear that he does not want to proceed with vascular access.  This has been discussed with palliative along with the family.  Decision has been made to follow-up outpatient with nephrology.  No signs of kidney need for emergent dialysis. - Nephrology following, rec OP f/u - Avoid nephrotoxic medications as possible, holding home losartan and HCTZ - Strict I&O  CAD  Left ventricular hypertrophy: Chronic. T-99 inconclusive.  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 History of complete heart block with pacemaker in place. Had chest pain now resolved. Troponin elevation related to CKD per cards. Per cards, if patient does not want HD, cannot do cath as would make him ESRD.  Placed on Amiodarone 400mg  QD and William f/u with Dr. Radford Pax as outpatient in 1-2 weeks.    - Telemetry - Continue Lopressor 150 mg BID, amlodipine 10 mg QD, and Hydralazine 100 mg TID - Continue amiodarone 400mg  QD - Follow up with Dr. 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  Remains able to discuss medical care and remembers  previous conversations.  Palliative to assess capacity today. - Follow-up with social work on SNF placement   Hypertension  Non-sustained ventricular tachycardia: Resolved. BP stable.  Had episodes of recurrent nonsustained runs of V. tach.  Still unclear cause, but no further cardiac workup at this time as patient does not want HD and cath would cause patient to become ESRD - See plan for LVH  Type 2 Diabetes Mellitus: Chronic, stable. A1c 6.3.  CBGs controlled. - Sensitive SSI - CBGs before meals and nightly - Holding home Janumet and Januvia while inpatient (however per PCP docs, Janumet was D/C in 10/2017)  Sacral ulcer secondary to fungal infection: Per wound care, leave dressings off of the affected area and apply antifungal powder every time patient is turned.  These instructions have been discussed with the patient. - Continue to monitor   FEN/GI: Carb modified diet Prophylaxis: Lovenox for decreased creatinine clearance   Disposition:  Patient stable for discharge to SNF following palliative recommendations with plans to follow-up with nephrology outpatient   Subjective:  Patient continues to feel well this morning.  No complaints of shortness of breath, chest pain, nausea or vomiting, abdominal pain.  He is still wanting to go home and not proceed with vascular access.   Objective: Temp:  [97.8 F (36.6 C)-98.6 F (37 C)] 98.4 F (36.9 C) (01/04 0515) Pulse Rate:  [72-80] 72 (01/04 0908) Resp:  [18-20] 18 (01/04 0908) BP: (143-179)/(70-99) 179/91 (01/04 0908) SpO2:  [98 %-100 %] 98 % (01/04 0908)  Physical Exam: General: pleasant lying in bed, A&Ox2, NAD with  non-toxic appearance Cardiovascular: regular rate and rhythm without murmurs, rubs, or gallops Lungs: clear to auscultation bilaterally with normal work of breathing Abdomen: soft, non-tender, non-distended, normoactive bowel sounds Skin: warm, dry, no rashes or lesions, cap refill < 2 seconds Extremities:  warm and well perfused, normal tone, no edema   Laboratory: Recent Labs  Lab 10/12/18 1201 10/13/18 1528 10/14/18 1106  WBC 9.8 8.8 9.3  HGB 13.7 11.2* 12.4*  HCT 40.4 32.6* 37.0*  PLT 316 PLATELET CLUMPS NOTED ON SMEAR, UNABLE TO ESTIMATE 372   Recent Labs  Lab 10/13/18 1528 10/14/18 0820 10/15/18 0714  NA 138 138 139  K 4.2 3.7 4.1  CL 108 109 108  CO2 19* 21* 24  BUN 47* 47* 45*  CREATININE 4.52* 4.74* 4.86*  CALCIUM 8.3* 8.5* 8.9  GLUCOSE 204* 163* 109*   Imaging/Diagnostic Tests: Dg Chest 2 View  Result Date: 10/04/2018 CLINICAL DATA:  Confusion, fatigue, rule out occult infection. Pt only c/o diarrhea to xray staff, stating he's had diarrhea for weeks. Hx of HTN, DM.confusion, fatigue, rule out occult infection EXAM: CHEST - 2 VIEW COMPARISON:  10/18/2017 FINDINGS: LEFT-sided pacemaker overlies normal cardiac silhouette. No effusion, infiltrate, pneumothorax. Degenerative osteophytosis of the spine. IMPRESSION: No acute cardiopulmonary process. Electronically Signed   By: Suzy Bouchard M.D.   On: 10/04/2018 18:31   Ct Head Wo Contrast  Result Date: 10/04/2018 CLINICAL DATA:  fall, AMS, hx of brain surgeryAltered level of consciousness (LOC), unexplained fall, AMS, hx of brain surgery EXAM: CT HEAD WITHOUT CONTRAST TECHNIQUE: Contiguous axial images were obtained from the base of the skull through the vertex without intravenous contrast. COMPARISON:  None. FINDINGS: Brain: No acute intracranial hemorrhage. No focal mass lesion. No CT evidence of acute infarction. No midline shift or mass effect. No hydrocephalus. Basilar cisterns are patent. There are periventricular and subcortical white matter hypodensities. Generalized cortical atrophy. Vascular: No hyperdense vessel or unexpected calcification. Skull: Prior RIGHT craniotomy. Sinuses/Orbits: Orbits normal. Coastal thickening in the ethmoid sinuses Other: None IMPRESSION: 1. No acute intracranial findings. 2. Atrophy  and white matter microvascular disease. Electronically Signed   By: Suzy Bouchard M.D.   On: 10/04/2018 19:56   Nm Tumor Localization W Spect  Result Date: 10/13/2018 CLINICAL DATA:  HEART FAILURE. 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 EXAM: NUCLEAR MEDICINE TUMOR LOCALIZATION. PYP CARDIAC AMYLOIDOSIS SCAN WITH SPECT TECHNIQUE: Following intravenous administration of radiopharmaceutical, anterior planar images of the chest were obtained. Regions of interest were placed on the heart and contralateral chest wall for quantitative assessment. Additional SPECT imaging of the chest was obtained. RADIOPHARMACEUTICALS:  19.2 mCi TECHNETIUM 99 PYROPHOSPHATE FINDINGS: Planar Visual assessment: Anterior planar imaging demonstrates radiotracer uptake within the heart less than than uptake within the adjacent ribs (Grade 0). Quantitative assessment : Quantitative assessment of the cardiac uptake compared to the contralateral chest wall is equal to (H/CL = 1.39). SPECT assessment: SPECT imaging of the chest demonstrates mild radiotracer accumulation within the LEFT ventricle. IMPRESSION: Visual and quantitative assessment (grade 0, H/CLL equal 1.3) are equivocal for transthyretin amyloidosis. Electronically Signed   By: Kerby Moors M.D.   On: 10/13/2018 13:01   US Renal  Result Date: 10/05/2018 CLINICAL DATA:  Acute onset of renal insufficiency. EXAM: RENAL / URINARY TRACT ULTRASOUND COMPLETE COMPARISON:  None. FINDINGS: Right Kidney: Renal measurements: 10.6 x 6.0 x 6.0 cm = volume: 202.9 mL. Increased parenchymal echogenicity is noted. A 1.5 cm cyst is noted at the interpole region of the right kidney. No hydronephrosis visualized.  Left Kidney: Renal measurements: 9.6 x 4.9 x 5.2 cm = volume: 127.3 mL. Increased parenchymal echogenicity is noted. Cysts are noted at the lower pole and upper pole of the left kidney, measuring 3.2 cm and 1.5 cm in size. The larger cyst has a septation and question of a peripheral soft  tissue component. No hydronephrosis visualized. Bladder: Appears normal for degree of bladder distention. IMPRESSION: 1. No evidence of hydronephrosis. 2. Increased renal parenchymal echogenicity raises concern for medical renal disease. 3. Bilateral renal cysts. A somewhat complex 3.2 cm cystic lesion at the lower pole of the left kidney has a septation and question of a peripheral soft tissue component. Contrast-enhanced CT or MRI would be helpful for further evaluation, on an elective nonemergent basis. Electronically Signed   By: Garald Balding M.D.   On: 10/05/2018 00:40     [place], DO 10/15/2018, 10:06 AM PGY-3, [facility] Intern pager: [phone], text pages welcome

## 2018-10-15 NOTE — Progress Notes (Signed)
[facility] Progress Note  Name: William [name] MRN: [id number] DOB: 1941-06-16  Subjective:  Pt with 900 mL UOP charted over 1/3. He denies new complaints.  Just wants to go home.  No family present when I saw him.  Review of systems: Denies shortness of breath or cough  No n/v No chest pain   Intake/Output Summary (Last 24 hours) at 10/15/2018 1045 Last data filed at 10/15/2018 0859 Gross per 24 hour  Intake 300 ml  Output 1375 ml  Net -1075 ml    Vitals:  Vitals:   10/14/18 1603 10/14/18 2042 10/15/18 0515 10/15/18 0908  BP: (!) 143/70 (!) 179/99 (!) 150/80 (!) 179/91  Pulse: 72 76 80 72  Resp: 20 18 18 18   Temp: 97.8 F (36.6 C) 98.6 F (37 C) 98.4 F (36.9 C)   TempSrc: Oral  Oral   SpO2: 100% 98% 99% 98%  Weight:      Height:         Physical Exam:  General adult male in bed, comfortable HEENT normocephalic atraumatic extraocular movements intact sclera anicteric Neck supple trachea midline Lungs clear and unlabored at rest Heart regular rate and rhythm no rubs or gallops appreciated Abdomen soft nontender nondistended Extremities no lower extremity edema Psych normal mood and affect Neuro has difficulty with completely participating in conversation due to memory.  Seems to confabulate some to engage in conversation   Medications reviewed   Labs:  BMP Latest Ref Rng & Units 10/15/2018 10/14/2018 10/13/2018  Glucose 70 - 99 mg/dL 109(H) 163(H) 204(H)  BUN 8 - 23 mg/dL 45(H) 47(H) 47(H)  Creatinine 0.61 - 1.24 mg/dL 4.86(H) 4.74(H) 4.52(H)  BUN/Creat Ratio 10 - 24 - - -  Sodium 135 - 145 mmol/L 139 138 138  Potassium 3.5 - 5.1 mmol/L 4.1 3.7 4.2  Chloride 98 - 111 mmol/L 108 109 108  CO2 22 - 32 mmol/L 24 21(L) 19(L)  Calcium 8.9 - 10.3 mg/dL 8.9 8.5(L) 8.3(L)     Assessment/Plan:  # AKI on CKD:  Baseline Cr 3 as of 12/2017, admitted with Cr 5.26 in the setting of diarrhea.  Now Cr hovering in the 4s which is thought to most likely  represent progression of his CKD.  He has been expressing strong desire to not have dialysis treatments and wishes to return home.  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  I do not think his cognitive difficulties are from uremia.  Even should he desire dialysis it is not currently indicated and I have explained this to family.    Family wishes to honor his wishes and plans no dialysis in the future.  -I think conservative management of CKD is very reasonable.  - will need nephrology f/u post discharge > will plan to see him in clinic in about 4 weeks after discharge.  I will arrange.  Family in agreement. -nephroprotective measures > care of DM (A1cs in the 6s), HTN, avoid hypovolemia, avoid nephrotoxins including NSAIDs and iodinated contrast.   # HTN with CKD  - Improved on hydralazine 100 TID -Also on amlodipine 10, metoprolol 150 BID - Holding losartan and HCTZ.  Would not resume either - 1 outlier in the 170s, if remains elevated would need additional med.  Would consider imdur 30.  Would avoid clonidine.   # Proteinuria  - SPEP neg M spike and UPEP pending. 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

## 2018-10-16 DIAGNOSIS — Z789 Other specified health status: Secondary | ICD-10-CM

## 2018-10-16 LAB — GLUCOSE, CAPILLARY
Glucose-Capillary: 145 mg/dL — ABNORMAL HIGH (ref 70–99)
Glucose-Capillary: 185 mg/dL — ABNORMAL HIGH (ref 70–99)
Glucose-Capillary: 129 mg/dL — ABNORMAL HIGH (ref 70–99)
Glucose-Capillary: 175 mg/dL — ABNORMAL HIGH (ref 70–99)

## 2018-10-16 MED ORDER — SODIUM BICARBONATE 650 MG PO TABS
650.0000 mg | ORAL_TABLET | Freq: Two times a day (BID) | ORAL | Status: DC
  Administered 2018-10-16 – 2018-10-21 (×9): 650 mg via ORAL
  Filled 2018-10-16 (×10): qty 1

## 2018-10-16 NOTE — Progress Notes (Signed)
[facility]    NEPHROLOGY PROGRESS NOTE  SUBJECTIVE: Feels well without acute complaints.  Denies chest pain, shortness of breath, nausea, vomiting, diarrhea or dysuria.  All other review of systems are negative.    OBJECTIVE:  Vitals:   10/16/18 0849 10/16/18 1749  BP: (!) 168/91 (!) 139/59  Pulse: 67 67  Resp:  18  Temp: 98.4 F (36.9 C) 98.2 F (36.8 C)  SpO2: 100% 99%   I/O last 3 completed shifts: In: 67 [P.O.:480] Out: 2615 [Urine:2615]   Genearl:  AAOx3 NAD HEENT: MMM [place] AT anicteric sclera Neck:  No JVD, no adenopathy CV:  Heart RRR  Lungs:  L/S CTA bilaterally Abd:  abd SNT/ND with normal BS GU:  Bladder non-palpable positive Foley Extremities:  No LE edema. Skin:  No skin rash  MEDICATIONS:   Current Facility-Administered Medications:  .  acetaminophen (TYLENOL) tablet 650 mg, 650 mg, Oral, Q6H PRN, 650 mg at 10/07/18 2219 **OR** acetaminophen (TYLENOL) suppository 650 mg, 650 mg, Rectal, Q6H PRN, Winfrey, Amanda C, MD .  amiodarone (PACERONE) tablet 400 mg, 400 mg, Oral, Daily, Meccariello, Bailey J, DO, 400 mg at 10/16/18 0916 .  amLODipine (NORVASC) tablet 10 mg, 10 mg, Oral, Daily, Winfrey, Alcario Drought, MD, 10 mg at 10/16/18 0916 .  aspirin EC tablet 81 mg, 81 mg, Oral, Daily, Darrelyn Hillock N, DO, 81 mg at 10/16/18 0916 .  calcium carbonate (TUMS - dosed in mg elemental calcium) chewable tablet 200 mg of elemental calcium, 1 tablet, Oral, Once, Anderson, Chelsey L, DO .  feeding supplement (ENSURE ENLIVE) (ENSURE ENLIVE) liquid 237 mL, 237 mL, Oral, BID BM, Beard, Samantha N, DO, 237 mL at 10/16/18 0916 .  heparin injection 5,000 Units, 5,000 Units, Subcutaneous, Q8H, Matilde Haymaker, MD, 5,000 Units at 10/14/18 2126 .  hydrALAZINE (APRESOLINE) tablet 100 mg, 100 mg, Oral, TID, Matilde Haymaker, MD, 100 mg at 10/16/18 1722 .  insulin aspart (novoLOG) injection 0-9 Units, 0-9 Units, Subcutaneous, TID WC, Winfrey, Alcario Drought, MD, 1 Units at 10/16/18  1727 .  metoprolol tartrate (LOPRESSOR) tablet 150 mg, 150 mg, Oral, BID, Chambliss, Jeb Levering, MD, 150 mg at 10/16/18 0917 .  pantoprazole (PROTONIX) EC tablet 20 mg, 20 mg, Oral, Daily, Milus Banister C, DO, 20 mg at 10/16/18 1610 .  rosuvastatin (CRESTOR) tablet 10 mg, 10 mg, Oral, q1800, Meccariello, Bailey J, DO, 10 mg at 10/16/18 1722 .  sodium bicarbonate tablet 1,300 mg, 1,300 mg, Oral, BID, Justin Mend, MD, 1,300 mg at 10/16/18 0917     LABS:  CBC Latest Ref Rng & Units 10/15/2018 10/14/2018 10/13/2018  WBC 4.0 - 10.5 K/uL 8.5 9.3 8.8  Hemoglobin 13.0 - 17.0 g/dL 11.8(L) 12.4(L) 11.2(L)  Hematocrit 39.0 - 52.0 % 35.5(L) 37.0(L) 32.6(L)  Platelets 150 - 400 K/uL 342 372 PLATELET CLUMPS NOTED ON SMEAR, UNABLE TO ESTIMATE    CMP Latest Ref Rng & Units 10/15/2018 10/14/2018 10/13/2018  Glucose 70 - 99 mg/dL 109(H) 163(H) 204(H)  BUN 8 - 23 mg/dL 45(H) 47(H) 47(H)  Creatinine 0.61 - 1.24 mg/dL 4.86(H) 4.74(H) 4.52(H)  Sodium 135 - 145 mmol/L 139 138 138  Potassium 3.5 - 5.1 mmol/L 4.1 3.7 4.2  Chloride 98 - 111 mmol/L 108 109 108  CO2 22 - 32 mmol/L 24 21(L) 19(L)  Calcium 8.9 - 10.3 mg/dL 8.9 8.5(L) 8.3(L)  Total Protein 6.5 - 8.1 g/dL - - -  Total Bilirubin 0.3 - 1.2 mg/dL - - -  Alkaline Phos 38 -  126 U/L - - -  AST 15 - 41 U/L - - -  ALT 0 - 44 U/L - - -    Lab Results  Component Value Date   CALCIUM 8.9 10/15/2018   PHOS 3.0 10/15/2018       Component Value Date/Time   COLORURINE YELLOW 10/05/2018 1102   APPEARANCEUR CLEAR 10/05/2018 1102   LABSPEC 1.014 10/05/2018 1102   PHURINE 5.0 10/05/2018 1102   GLUCOSEU 50 (A) 10/05/2018 1102   HGBUR SMALL (A) 10/05/2018 1102   BILIRUBINUR NEGATIVE 10/05/2018 1102   KETONESUR NEGATIVE 10/05/2018 1102   PROTEINUR 100 (A) 10/05/2018 1102   NITRITE NEGATIVE 10/05/2018 1102   LEUKOCYTESUR NEGATIVE 10/05/2018 1102      Component Value Date/Time   HCO3 26.3 (H) 03/30/2007 0201   TCO2 28 03/30/2007 0201    No results found  for: IRON, TIBC, FERRITIN, IRONPCTSAT     ASSESSMENT/PLAN:     Problem List Items Addressed This Visit      Genitourinary   AKI (acute kidney injury) ([place])   Relevant Orders   US RENAL (Completed)     Other   Altered mental status - Primary   Dehydration   Diarrhea      1.  Chronic kidney disease stage IV with a baseline serum creatinine of 3 in March 2019.  2.  Acute kidney injury.  His creatinine was around 5.2 on admission in the setting of diarrhea.  He has stabilized into the fours.  The patient does not want dialysis, although currently, there is no indication for this.  Renal function is currently stable.  Needs follow-up in the nephrology office.  We will remove Foley as a bladder scan revealed 109 mL of urine.  3.  Hypertension.  Blood pressures are variable.  Losartan and HCTZ are on hold.  If blood pressures remain elevated, would begin beta-blockade.  4.  Proteinuria.  SPEP was negative.  Likely secondary to diabetes mellitus.  5.  Diarrhea.  Resolved.  6.  Metabolic acidosis.  Likely secondary to chronic kidney disease and diarrhea.  Improved.  Will reduce the dose of the sodium bicarbonate.  7.  Complex renal cyst.  Needs follow-up in 6 months.  8.  Change in mental status.  Patient has had a decline in his functional status.  Not clearly related to uremia.  Off statin for this reason per the family request.  Would benefit from short-term rehab if able.    [place], DO, [place]

## 2018-10-16 NOTE — Progress Notes (Signed)
CSW called and spoke with Elnita Maxwell at [facility]. We are still awaiting insurance authorization. Kirsten said that we should have authorization on Monday.   CSW will continue to follow until patient is ready for discharge.   Domenic Schwab, MSW, [place]

## 2018-10-17 ENCOUNTER — Encounter (HOSPITAL_COMMUNITY): Payer: Self-pay | Admitting: Family Medicine

## 2018-10-17 DIAGNOSIS — Z789 Other specified health status: Secondary | ICD-10-CM

## 2018-10-17 DIAGNOSIS — N281 Cyst of kidney, acquired: Secondary | ICD-10-CM

## 2018-10-17 DIAGNOSIS — I472 Ventricular tachycardia: Secondary | ICD-10-CM

## 2018-10-17 DIAGNOSIS — I4729 Other ventricular tachycardia: Secondary | ICD-10-CM

## 2018-10-17 HISTORY — DX: Cyst of kidney, acquired: N28.1

## 2018-10-17 LAB — BASIC METABOLIC PANEL
Anion gap: 7 (ref 5–15)
BUN: 46 mg/dL — ABNORMAL HIGH (ref 8–23)
CO2: 22 mmol/L (ref 22–32)
Calcium: 8.8 mg/dL — ABNORMAL LOW (ref 8.9–10.3)
Chloride: 108 mmol/L (ref 98–111)
Creatinine, Ser: 5.02 mg/dL — ABNORMAL HIGH (ref 0.61–1.24)
GFR calc Af Amer: 12 mL/min — ABNORMAL LOW (ref >60)
GFR calc non Af Amer: 10 mL/min — ABNORMAL LOW (ref >60)
Glucose, Bld: 121 mg/dL — ABNORMAL HIGH (ref 70–99)
Potassium: 4.5 mmol/L (ref 3.5–5.1)
Sodium: 137 mmol/L (ref 135–145)

## 2018-10-17 LAB — GLUCOSE, CAPILLARY
Glucose-Capillary: 115 mg/dL — ABNORMAL HIGH (ref 70–99)
Glucose-Capillary: 111 mg/dL — ABNORMAL HIGH (ref 70–99)
Glucose-Capillary: 155 mg/dL — ABNORMAL HIGH (ref 70–99)
Glucose-Capillary: 58 mg/dL — ABNORMAL LOW (ref 70–99)
Glucose-Capillary: 69 mg/dL — ABNORMAL LOW (ref 70–99)

## 2018-10-17 NOTE — Progress Notes (Signed)
[facility]    NEPHROLOGY PROGRESS NOTE  SUBJECTIVE: Feels well without acute complaints.  Denies chest pain, shortness of breath, nausea, vomiting, diarrhea or dysuria.  All other review of systems are negative.   OBJECTIVE:  Vitals:   10/17/18 0743 10/17/18 1233  BP: (!) 165/81 (!) 142/77  Pulse: 67 (!) 59  Resp: 18 18  Temp: 98.6 F (37 C) (!) 97.3 F (36.3 C)  SpO2: 98% 100%   I/O last 3 completed shifts: In: 52 [P.O.:390] Out: 1440 [Urine:1440]   Genearl:  AAOx3 NAD HEENT: MMM [place] AT anicteric sclera Neck:  No JVD, no adenopathy CV:  Heart RRR  Lungs:  L/S CTA bilaterally Abd:  abd SNT/ND with normal BS GU:  Bladder non-palpable positive Foley Extremities:  No LE edema. Skin:  No skin rash  MEDICATIONS:   Current Facility-Administered Medications:  .  acetaminophen (TYLENOL) tablet 650 mg, 650 mg, Oral, Q6H PRN, 650 mg at 10/07/18 2219 **OR** acetaminophen (TYLENOL) suppository 650 mg, 650 mg, Rectal, Q6H PRN, Winfrey, Amanda C, MD .  amiodarone (PACERONE) tablet 400 mg, 400 mg, Oral, Daily, Meccariello, Bailey J, DO, 400 mg at 10/17/18 0848 .  amLODipine (NORVASC) tablet 10 mg, 10 mg, Oral, Daily, Winfrey, Alcario Drought, MD, 10 mg at 10/17/18 0848 .  aspirin EC tablet 81 mg, 81 mg, Oral, Daily, Darrelyn Hillock N, DO, 81 mg at 10/17/18 0848 .  calcium carbonate (TUMS - dosed in mg elemental calcium) chewable tablet 200 mg of elemental calcium, 1 tablet, Oral, Once, Anderson, Chelsey L, DO .  feeding supplement (ENSURE ENLIVE) (ENSURE ENLIVE) liquid 237 mL, 237 mL, Oral, BID BM, Beard, Samantha N, DO, 237 mL at 10/17/18 0849 .  heparin injection 5,000 Units, 5,000 Units, Subcutaneous, Q8H, Matilde Haymaker, MD, 5,000 Units at 10/14/18 2126 .  hydrALAZINE (APRESOLINE) tablet 100 mg, 100 mg, Oral, TID, Matilde Haymaker, MD, 100 mg at 10/17/18 0847 .  insulin aspart (novoLOG) injection 0-9 Units, 0-9 Units, Subcutaneous, TID WC, Winfrey, Alcario Drought, MD, 1 Units at  10/16/18 1727 .  metoprolol tartrate (LOPRESSOR) tablet 150 mg, 150 mg, Oral, BID, Chambliss, Jeb Levering, MD, 150 mg at 10/17/18 0848 .  pantoprazole (PROTONIX) EC tablet 20 mg, 20 mg, Oral, Daily, Milus Banister C, DO, 20 mg at 10/17/18 0849 .  rosuvastatin (CRESTOR) tablet 10 mg, 10 mg, Oral, q1800, Meccariello, Bailey J, DO, 10 mg at 10/16/18 1722 .  sodium bicarbonate tablet 650 mg, 650 mg, Oral, BID, Chandani Rogowski A, DO, 650 mg at 10/17/18 0847     LABS:  CBC Latest Ref Rng & Units 10/15/2018 10/14/2018 10/13/2018  WBC 4.0 - 10.5 K/uL 8.5 9.3 8.8  Hemoglobin 13.0 - 17.0 g/dL 11.8(L) 12.4(L) 11.2(L)  Hematocrit 39.0 - 52.0 % 35.5(L) 37.0(L) 32.6(L)  Platelets 150 - 400 K/uL 342 372 PLATELET CLUMPS NOTED ON SMEAR, UNABLE TO ESTIMATE    CMP Latest Ref Rng & Units 10/17/2018 10/15/2018 10/14/2018  Glucose 70 - 99 mg/dL 121(H) 109(H) 163(H)  BUN 8 - 23 mg/dL 46(H) 45(H) 47(H)  Creatinine 0.61 - 1.24 mg/dL 5.02(H) 4.86(H) 4.74(H)  Sodium 135 - 145 mmol/L 137 139 138  Potassium 3.5 - 5.1 mmol/L 4.5 4.1 3.7  Chloride 98 - 111 mmol/L 108 108 109  CO2 22 - 32 mmol/L 22 24 21(L)  Calcium 8.9 - 10.3 mg/dL 8.8(L) 8.9 8.5(L)  Total Protein 6.5 - 8.1 g/dL - - -  Total Bilirubin 0.3 - 1.2 mg/dL - - -  Alkaline Phos 38 -  126 U/L - - -  AST 15 - 41 U/L - - -  ALT 0 - 44 U/L - - -    Lab Results  Component Value Date   CALCIUM 8.8 (L) 10/17/2018   PHOS 3.0 10/15/2018       Component Value Date/Time   COLORURINE YELLOW 10/05/2018 1102   APPEARANCEUR CLEAR 10/05/2018 1102   LABSPEC 1.014 10/05/2018 1102   PHURINE 5.0 10/05/2018 1102   GLUCOSEU 50 (A) 10/05/2018 1102   HGBUR SMALL (A) 10/05/2018 1102   BILIRUBINUR NEGATIVE 10/05/2018 1102   KETONESUR NEGATIVE 10/05/2018 1102   PROTEINUR 100 (A) 10/05/2018 1102   NITRITE NEGATIVE 10/05/2018 1102   LEUKOCYTESUR NEGATIVE 10/05/2018 1102      Component Value Date/Time   HCO3 26.3 (H) 03/30/2007 0201   TCO2 28 03/30/2007 0201    No results  found for: IRON, TIBC, FERRITIN, IRONPCTSAT     ASSESSMENT/PLAN:     Problem List Items Addressed This Visit      Genitourinary   AKI (acute kidney injury) ([place])   Relevant Orders   US RENAL (Completed)     Other   Altered mental status - Primary   Dehydration   Diarrhea      1.  Chronic kidney disease stage IV with a baseline serum creatinine of 3 in March 2019.   2.  Acute kidney injury.  His creatinine was around 5.2 on admission in the setting of diarrhea.  He has stabilized into the fours.  The patient does not want dialysis, although currently, there is no indication for this.  Renal function is currently stable.  Needs follow-up in the nephrology office.  No need for Foley catheter.  Okay for disposition from a renal standpoint.  3.  Hypertension.  Blood pressures are variable.  Losartan and HCTZ are on hold.  If blood pressures remain elevated, would begin beta-blockade.  4.  Proteinuria.  SPEP was negative.  Likely secondary to diabetes mellitus.  5.  Diarrhea.  Resolved.  6.  Metabolic acidosis.  Likely secondary to chronic kidney disease and diarrhea.  Improved.  Will reduce the dose of the sodium bicarbonate.  7.  Complex renal cyst.  Needs follow-up in 6 months.  8.  Change in mental status.  Patient has had a decline in his functional status.  Not clearly related to uremia.  Off statin for this reason per the family request.  Would benefit from short-term rehab if able.    [place], DO, [place]

## 2018-10-17 NOTE — Progress Notes (Signed)
CRITICAL VALUE ALERT  Critical Value:  CBG=58 Date & Time Notied:  1/6 @2126hrs  Provider Notified: Via text Plan- CBG= 69 after having coke. He also continued to have a snack sandwich after the 69 CBG check

## 2018-10-17 NOTE — Progress Notes (Addendum)
Family Medicine Teaching Service Daily Progress Note Intern Pager: [phone]  Patient name: William [name] Medical record number: [id number] Date of birth: 01-23-1941 Age: 78 y.o. Gender: male  Primary Care Provider: Patient, No Pcp Per Consultants: None Code Status: Full  Pt Overview and Major Events to Date:  12/24: patient admitted for headache, fall, hypertensive emergency 01/03: meeting with nephro and palliative, decision to f/u outpt and not proceed with HD  Assessment and Plan: William [name] is a 78 year old male presenting after a fall with a headache, found to have altered mental status, AKI, and hypertensive urgency.  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  AKI on CKD stage IV:   Patient will follow up with nephrology as outpatient.  He has decided to not proceed with hemodialysis.  Nephrology notes that hemodialysis is not emergent at this time. - Nephrology following, rec OP f/u - Avoid nephrotoxic medications as possible, holding home losartan and HCTZ - Strict I&O  CAD  Left ventricular hypertrophy: Chronic. T-99 inconclusive.  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 History of complete heart block with pacemaker in place. Troponin elevation related to CKD per cards. Per cards, if patient does not want HD, cannot do cath as would make him ESRD.  Placed on Amiodarone 400mg  QD and will f/u with Dr. Radford Pax as outpatient in 1-2 weeks.  Continues to deny CP, SOB. - Telemetry - Continue Lopressor 150 mg BID, amlodipine 10 mg QD, and Hydralazine 100 mg TID - Continue amiodarone 400mg  QD - Follow up with Dr. 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  Remains able to discuss medical care and remembers previous conversations.   - Follow-up with social work on SNF placement   Hypertension Stable.  At home on amlodipine, hydral,  lopressor. Holding home losartan, HCTZ per nephrology.  BP this a.m. 165/81, heart rate 60s to 70s.  Has remained hypertensive.  Per nephrology can start beta-blockade., patient already on metoprolol and wound not titrate up based on low HR.  Will consider adding Doxazosin if patient has BPH symptoms, otherwise will add clonidine. - discuss with patient doxazosin vs clonidine addmition   -We will hold off on starting beta-blocker given patient's low heart rate -Continue to hold losartan and hydrochlorothiazide per nephrology  Non-sustained ventricular tachycardia: Resolved. Had episodes of recurrent nonsustained runs of V. tach.  Still unclear cause, but no further cardiac workup at this time as patient does not want HD and cath would cause patient to become ESRD - See plan for LVH  Type 2 Diabetes Mellitus: Chronic, stable. A1c 6.3.  CBGs controlled, this a.m. 115.  - Sensitive SSI - CBGs before meals and nightly - Holding home Janumet and Januvia while inpatient (however per PCP docs, Janumet was D/C in 10/2017). Would likely not continue janumet on d/c given CKD - plan to change januvia to Tradjenta on d/c as is not metabolized by the kidneys  Sacral ulcer secondary to fungal infection: Per wound care, leave dressings off of the affected area and apply antifungal powder every time patient is turned.  These instructions have been discussed with the patient. - Continue to monitor   FEN/GI: Carb modified diet Prophylaxis: Lovenox for decreased creatinine clearance  Disposition:  Patient is stable for discharge to SNF  Subjective:  Patient states that he is feeling well this morning and has no complaints.  No chest pain or shortness of breath.  States that he is looking forward to being discharged to SNF.   Objective: Temp:  [98.2 F (36.8 C)-98.6 F (37 C)] 98.6 F (37 C) (01/06 0743) Pulse Rate:  [66-72] 67 (01/06 0743) Resp:  [18-20] 18 (01/06 0743) BP: (139-168)/(59-91) 165/81  (01/06 0743) SpO2:  [98 %-100 %] 98 % (01/06 0743) Weight:  [82.2 kg] 82.2 kg (01/06 0427)  Physical Exam: General: 78 y.o. male in NAD Cardio: RRR no m/r/g Lungs: CTAB, no wheezing, no rhonchi, no crackles, no increased work of breathing Abdomen: Soft, non-tender to palpation, positive bowel sounds Skin: warm and dry Extremities: No edema Neuro: ANO x2, is not aware of location, can discern that he is in the hospital only with prompting   Laboratory: Recent Labs  Lab 10/13/18 1528 10/14/18 1106 10/15/18 0955  WBC 8.8 9.3 8.5  HGB 11.2* 12.4* 11.8*  HCT 32.6* 37.0* 35.5*  PLT PLATELET CLUMPS NOTED ON SMEAR, UNABLE TO ESTIMATE 372 342   Recent Labs  Lab 10/13/18 1528 10/14/18 0820 10/15/18 0714  NA 138 138 139  K 4.2 3.7 4.1  CL 108 109 108  CO2 19* 21* 24  BUN 47* 47* 45*  CREATININE 4.52* 4.74* 4.86*  CALCIUM 8.3* 8.5* 8.9  GLUCOSE 204* 163* 109*   Imaging/Diagnostic Tests: Dg Chest 2 View  Result Date: 10/04/2018 CLINICAL DATA:  Confusion, fatigue, rule out occult infection. Pt only c/o diarrhea to xray staff, stating he's had diarrhea for weeks. Hx of HTN, DM.confusion, fatigue, rule out occult infection EXAM: CHEST - 2 VIEW COMPARISON:  10/18/2017 FINDINGS: LEFT-sided pacemaker overlies normal cardiac silhouette. No effusion, infiltrate, pneumothorax. Degenerative osteophytosis of the spine. IMPRESSION: No acute cardiopulmonary process. Electronically Signed   By: Suzy Bouchard M.D.   On: 10/04/2018 18:31   Ct Head Wo Contrast  Result Date: 10/04/2018 CLINICAL DATA:  fall, AMS, hx of brain surgeryAltered level of consciousness (LOC), unexplained fall, AMS, hx of brain surgery EXAM: CT HEAD WITHOUT CONTRAST TECHNIQUE: Contiguous axial images were obtained from the base of the skull through the vertex without intravenous contrast. COMPARISON:  None. FINDINGS: Brain: No acute intracranial hemorrhage. No focal mass lesion. No CT evidence of acute infarction. No  midline shift or mass effect. No hydrocephalus. Basilar cisterns are patent. There are periventricular and subcortical white matter hypodensities. Generalized cortical atrophy. Vascular: No hyperdense vessel or unexpected calcification. Skull: Prior RIGHT craniotomy. Sinuses/Orbits: Orbits normal. Coastal thickening in the ethmoid sinuses Other: None IMPRESSION: 1. No acute intracranial findings. 2. Atrophy and white matter microvascular disease. Electronically Signed   By: Suzy Bouchard M.D.   On: 10/04/2018 19:56   Nm Tumor Localization W Spect  Result Date: 10/13/2018 CLINICAL DATA:  HEART FAILURE. 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 EXAM: NUCLEAR MEDICINE TUMOR LOCALIZATION. PYP CARDIAC AMYLOIDOSIS SCAN WITH SPECT TECHNIQUE: Following intravenous administration of radiopharmaceutical, anterior planar images of the chest were obtained. Regions of interest were placed on the heart and contralateral chest wall for quantitative assessment. Additional SPECT imaging of the chest was obtained. RADIOPHARMACEUTICALS:  19.2 mCi TECHNETIUM 99 PYROPHOSPHATE FINDINGS: Planar Visual assessment: Anterior planar imaging demonstrates radiotracer uptake within the heart less than than uptake within the adjacent ribs (Grade 0). Quantitative assessment : Quantitative assessment of the cardiac uptake compared to the contralateral chest wall is equal to (H/CL = 1.39). SPECT assessment: SPECT imaging of the chest demonstrates mild radiotracer accumulation within the LEFT ventricle. IMPRESSION: Visual and quantitative assessment (grade 0, H/CLL equal 1.3)  are equivocal for transthyretin amyloidosis. Electronically Signed   By: Kerby Moors M.D.   On: 10/13/2018 13:01   US Renal  Result Date: 10/05/2018 CLINICAL DATA:  Acute onset of renal insufficiency. EXAM: RENAL / URINARY TRACT ULTRASOUND COMPLETE COMPARISON:  None. FINDINGS: Right Kidney: Renal measurements: 10.6 x 6.0 x 6.0 cm = volume: 202.9 mL. Increased parenchymal  echogenicity is noted. A 1.5 cm cyst is noted at the interpole region of the right kidney. No hydronephrosis visualized. Left Kidney: Renal measurements: 9.6 x 4.9 x 5.2 cm = volume: 127.3 mL. Increased parenchymal echogenicity is noted. Cysts are noted at the lower pole and upper pole of the left kidney, measuring 3.2 cm and 1.5 cm in size. The larger cyst has a septation and question of a peripheral soft tissue component. No hydronephrosis visualized. Bladder: Appears normal for degree of bladder distention. IMPRESSION: 1. No evidence of hydronephrosis. 2. Increased renal parenchymal echogenicity raises concern for medical renal disease. 3. Bilateral renal cysts. A somewhat complex 3.2 cm cystic lesion at the lower pole of the left kidney has a septation and question of a peripheral soft tissue component. Contrast-enhanced CT or MRI would be helpful for further evaluation, on an elective nonemergent basis. Electronically Signed   By: Garald Balding M.D.   On: 10/05/2018 00:40    Mar Walmer, Bernita Raisin, DO 10/17/2018, 7:59 AM PGY-1, [place] Intern pager: [phone], text pages welcome
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
## 2018-10-17 NOTE — Telephone Encounter (Signed)
According to the pts chart he is still in the hospital at this time. We will call tomorrow.

## 2018-10-18 ENCOUNTER — Encounter (HOSPITAL_COMMUNITY): Payer: Medicare HMO

## 2018-10-18 DIAGNOSIS — I10 Essential (primary) hypertension: Secondary | ICD-10-CM

## 2018-10-18 DIAGNOSIS — E119 Type 2 diabetes mellitus without complications: Secondary | ICD-10-CM

## 2018-10-18 DIAGNOSIS — N185 Chronic kidney disease, stage 5: Secondary | ICD-10-CM

## 2018-10-18 LAB — CBC
HCT: 36 % — ABNORMAL LOW (ref 39.0–52.0)
Hemoglobin: 12.1 g/dL — ABNORMAL LOW (ref 13.0–17.0)
MCH: 32.5 pg (ref 26.0–34.0)
MCHC: 33.6 g/dL (ref 30.0–36.0)
MCV: 96.8 fL (ref 80.0–100.0)
Platelets: 356 10*3/uL (ref 150–400)
RBC: 3.72 MIL/uL — ABNORMAL LOW (ref 4.22–5.81)
RDW: 12.5 % (ref 11.5–15.5)
WBC: 8.4 10*3/uL (ref 4.0–10.5)
nRBC: 0 % (ref 0.0–0.2)

## 2018-10-18 LAB — GLUCOSE, CAPILLARY
Glucose-Capillary: 116 mg/dL — ABNORMAL HIGH (ref 70–99)
Glucose-Capillary: 124 mg/dL — ABNORMAL HIGH (ref 70–99)
Glucose-Capillary: 152 mg/dL — ABNORMAL HIGH (ref 70–99)
Glucose-Capillary: 163 mg/dL — ABNORMAL HIGH (ref 70–99)
Glucose-Capillary: 142 mg/dL — ABNORMAL HIGH (ref 70–99)

## 2018-10-18 LAB — BASIC METABOLIC PANEL
Anion gap: 12 (ref 5–15)
BUN: 43 mg/dL — ABNORMAL HIGH (ref 8–23)
CO2: 22 mmol/L (ref 22–32)
Calcium: 8.5 mg/dL — ABNORMAL LOW (ref 8.9–10.3)
Chloride: 103 mmol/L (ref 98–111)
Creatinine, Ser: 5.19 mg/dL — ABNORMAL HIGH (ref 0.61–1.24)
GFR calc Af Amer: 11 mL/min — ABNORMAL LOW (ref >60)
GFR calc non Af Amer: 10 mL/min — ABNORMAL LOW (ref >60)
Glucose, Bld: 138 mg/dL — ABNORMAL HIGH (ref 70–99)
Potassium: 3.6 mmol/L (ref 3.5–5.1)
Sodium: 137 mmol/L (ref 135–145)

## 2018-10-18 MED ORDER — CLONIDINE HCL 0.1 MG PO TABS
0.1000 mg | ORAL_TABLET | Freq: Two times a day (BID) | ORAL | Status: DC
  Administered 2018-10-18 – 2018-10-21 (×6): 0.1 mg via ORAL
  Filled 2018-10-18 (×7): qty 1

## 2018-10-18 NOTE — Progress Notes (Addendum)
Family Medicine Teaching Service Daily Progress Note Intern Pager: [phone]  Patient name: William [name] Medical record number: [id number] Date of birth: Oct 13, 1940 Age: 78 y.o. Gender: male  Primary Care Provider: Patient, No Pcp Per Consultants: None Code Status: Full  Pt Overview and Major Events to Date:  12/24: patient admitted for headache, fall, hypertensive emergency 01/03: meeting with nephro and palliative, decision to f/u outpt and not proceed with HD  Assessment and Plan: William [name] is a 78 year old male presenting after a fall with a headache, found to have altered mental status, AKI, and hypertensive urgency.  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  AKI on CKD stage IV:   Patient will follow up with nephrology as outpatient.  He had previously noted that he had not wanted HD, but yesterday changed his mind.  Per nephrology, no indication for HD at present.  Conversation about HD can continue as outpatient. - Nephrology following, rec OP f/u - Avoid nephrotoxic medications as possible, holding home losartan and HCTZ - Strict I&O  CAD  Left ventricular hypertrophy: Chronic. T-99 inconclusive.  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 History of complete heart block with pacemaker in place. Troponin elevation related to CKD per cards. Per cards, if patient does not want HD, cannot do cath as would make him ESRD.  Placed on Amiodarone 400mg  QD and will f/u with Dr. Radford Pax as outpatient in 1-2 weeks.  Continues to deny CP, SOB. - Telemetry - Continue Lopressor 150 mg BID, amlodipine 10 mg QD, and Hydralazine 100 mg TID - Continue amiodarone 400mg  QD - Follow up with Dr. 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  Remains able to discuss medical care and remembers previous conversations.   - Follow-up with social work on SNF  placement   Hypertension Stable.  At home on amlodipine, hydral, lopressor. Holding home losartan, HCTZ per nephrology.  BP this AM 163/80.  Plan to start clonidine 0.1mg  BID today, as denies BPH symptoms, and patient can likely not tolerate increase in BB as HR is 60. - clonidine 0.1mg  BID -We will hold off on starting beta-blocker given patient's low heart rate -Continue to hold losartan and hydrochlorothiazide per nephrology  Non-sustained ventricular tachycardia: Resolved. Had episodes of recurrent nonsustained runs of V. tach.  Still unclear cause, but no further cardiac workup at this time as patient does not want HD and cath would cause patient to become ESRD - See plan for LVH  Type 2 Diabetes Mellitus: Chronic, stable. A1c 6.3.  CBG 69 at 2233, corrected to 116 with soda and snack.   - Given patient's A1c of 6.3 and hypoglycemic event last night, will DC sliding scale insulin - CBGs before meals and nightly - Holding home Janumet and Januvia while inpatient (however per PCP docs, Janumet was D/C in 10/2017). Would likely not continue janumet on d/c given CKD - plan to change januvia to Tradjenta on d/c as is not metabolized by the kidneys  Sacral ulcer secondary to fungal infection: Per wound care, leave dressings off of the affected area and apply antifungal powder every time patient is turned.  These instructions have been discussed with the patient. - Continue to monitor   FEN/GI: Carb modified diet Prophylaxis: Lovenox for decreased creatinine clearance  Disposition:  Patient is stable for discharge to SNF  Subjective:  Denies any complaints this  morning.  States that he would like to proceed with hemodialysis in the future.  Patient denies any chest pain or shortness of breath.   Objective: Temp:  [97.3 F (36.3 C)-98.7 F (37.1 C)] 98.3 F (36.8 C) (01/07 0728) Pulse Rate:  [59-67] 60 (01/07 0728) Resp:  [18] 18 (01/07 0728) BP: (142-165)/(77-84) 163/80 (01/07  0728) SpO2:  [98 %-100 %] 99 % (01/07 0728) Weight:  [82 kg] 82 kg (01/06 2108)  Physical Exam: General: 78 y.o. male in NAD Cardio: RRR no m/r/g Lungs: CTAB, no wheezing, no rhonchi, no crackles, no increased work of breathing Abdomen: Soft, non-tender to palpation, positive bowel sounds Skin: warm and dry Extremities: No edema    Laboratory: Recent Labs  Lab 10/13/18 1528 10/14/18 1106 10/15/18 0955  WBC 8.8 9.3 8.5  HGB 11.2* 12.4* 11.8*  HCT 32.6* 37.0* 35.5*  PLT PLATELET CLUMPS NOTED ON SMEAR, UNABLE TO ESTIMATE 372 342   Recent Labs  Lab 10/14/18 0820 10/15/18 0714 10/17/18 0715  NA 138 139 137  K 3.7 4.1 4.5  CL 109 108 108  CO2 21* 24 22  BUN 47* 45* 46*  CREATININE 4.74* 4.86* 5.02*  CALCIUM 8.5* 8.9 8.8*  GLUCOSE 163* 109* 121*   Imaging/Diagnostic Tests: Dg Chest 2 View  Result Date: 10/04/2018 CLINICAL DATA:  Confusion, fatigue, rule out occult infection. Pt only c/o diarrhea to xray staff, stating he's had diarrhea for weeks. Hx of HTN, DM.confusion, fatigue, rule out occult infection EXAM: CHEST - 2 VIEW COMPARISON:  10/18/2017 FINDINGS: LEFT-sided pacemaker overlies normal cardiac silhouette. No effusion, infiltrate, pneumothorax. Degenerative osteophytosis of the spine. IMPRESSION: No acute cardiopulmonary process. Electronically Signed   By: Suzy Bouchard M.D.   On: 10/04/2018 18:31   Ct Head Wo Contrast  Result Date: 10/04/2018 CLINICAL DATA:  fall, AMS, hx of brain surgeryAltered level of consciousness (LOC), unexplained fall, AMS, hx of brain surgery EXAM: CT HEAD WITHOUT CONTRAST TECHNIQUE: Contiguous axial images were obtained from the base of the skull through the vertex without intravenous contrast. COMPARISON:  None. FINDINGS: Brain: No acute intracranial hemorrhage. No focal mass lesion. No CT evidence of acute infarction. No midline shift or mass effect. No hydrocephalus. Basilar cisterns are patent. There are periventricular and  subcortical white matter hypodensities. Generalized cortical atrophy. Vascular: No hyperdense vessel or unexpected calcification. Skull: Prior RIGHT craniotomy. Sinuses/Orbits: Orbits normal. Coastal thickening in the ethmoid sinuses Other: None IMPRESSION: 1. No acute intracranial findings. 2. Atrophy and white matter microvascular disease. Electronically Signed   By: Suzy Bouchard M.D.   On: 10/04/2018 19:56   Nm Tumor Localization W Spect  Result Date: 10/13/2018 CLINICAL DATA:  HEART FAILURE. 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 EXAM: NUCLEAR MEDICINE TUMOR LOCALIZATION. PYP CARDIAC AMYLOIDOSIS SCAN WITH SPECT TECHNIQUE: Following intravenous administration of radiopharmaceutical, anterior planar images of the chest were obtained. Regions of interest were placed on the heart and contralateral chest wall for quantitative assessment. Additional SPECT imaging of the chest was obtained. RADIOPHARMACEUTICALS:  19.2 mCi TECHNETIUM 99 PYROPHOSPHATE FINDINGS: Planar Visual assessment: Anterior planar imaging demonstrates radiotracer uptake within the heart less than than uptake within the adjacent ribs (Grade 0). Quantitative assessment : Quantitative assessment of the cardiac uptake compared to the contralateral chest wall is equal to (H/CL = 1.39). SPECT assessment: SPECT imaging of the chest demonstrates mild radiotracer accumulation within the LEFT ventricle. IMPRESSION: Visual and quantitative assessment (grade 0, H/CLL equal 1.3) are equivocal for transthyretin amyloidosis. Electronically Signed   By: Kerby Moors  M.D.   On: 10/13/2018 13:01   US Renal  Result Date: 10/05/2018 CLINICAL DATA:  Acute onset of renal insufficiency. EXAM: RENAL / URINARY TRACT ULTRASOUND COMPLETE COMPARISON:  None. FINDINGS: Right Kidney: Renal measurements: 10.6 x 6.0 x 6.0 cm = volume: 202.9 mL. Increased parenchymal echogenicity is noted. A 1.5 cm cyst is noted at the interpole region of the right kidney. No hydronephrosis  visualized. Left Kidney: Renal measurements: 9.6 x 4.9 x 5.2 cm = volume: 127.3 mL. Increased parenchymal echogenicity is noted. Cysts are noted at the lower pole and upper pole of the left kidney, measuring 3.2 cm and 1.5 cm in size. The larger cyst has a septation and question of a peripheral soft tissue component. No hydronephrosis visualized. Bladder: Appears normal for degree of bladder distention. IMPRESSION: 1. No evidence of hydronephrosis. 2. Increased renal parenchymal echogenicity raises concern for medical renal disease. 3. Bilateral renal cysts. A somewhat complex 3.2 cm cystic lesion at the lower pole of the left kidney has a septation and question of a peripheral soft tissue component. Contrast-enhanced CT or MRI would be helpful for further evaluation, on an elective nonemergent basis. Electronically Signed   By: Garald Balding M.D.   On: 10/05/2018 00:40    Soliyana Mcchristian, Bernita Raisin, DO 10/18/2018, 7:43 AM PGY-1, [place] Intern pager: [phone], text pages welcome

## 2018-10-18 NOTE — Progress Notes (Signed)
Physical Therapy Treatment Patient Details Name: William [name] MRN: [id number] DOB: Jul 17, 1941 Today's Date: 10/18/2018    History of Present Illness Pt is a 78 y.o. male presenting after a fall with headache, found to have altered mental status, AKI, and hypertensive urgency. 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    PT Comments    Pt progressing towards physical therapy goals. Cognition continues to be a limiting factor, especially with safety awareness and completion of task. SNF remains appropriate and will continue to follow.    Follow Up Recommendations  SNF;Supervision for mobility/OOB     Equipment Recommendations  Other (comment)(defer to next venue)    Recommendations for Other Services       Precautions / Restrictions Precautions Precautions: Fall Restrictions Weight Bearing Restrictions: No    Mobility  Bed Mobility Overal bed mobility: Needs Assistance Bed Mobility: Supine to Sit Rolling: Min guard Sidelying to sit: HOB elevated;Min assist Supine to sit: Min guard;HOB elevated     General bed mobility comments: Min Guard A for safety  Transfers Overall transfer level: Needs assistance Equipment used: Rolling walker (2 wheeled) Transfers: Sit to/from Stand Sit to Stand: Min guard         General transfer comment: Min Guard A for safety  Ambulation/Gait Ambulation/Gait assistance: Herbalist (Feet): 200 Feet Assistive device: Rolling walker (2 wheeled) Gait Pattern/deviations: Shuffle;Step-through pattern;Decreased stride length;Drifts right/left;Narrow base of support Gait velocity: decreased Gait velocity interpretation: 1.31 - 2.62 ft/sec, indicative of limited community ambulator General Gait Details: Pt ambulating around room without AD however reports feeling more comfortable with RW for support. Continue to  note decreased foot clearance however L appeared worse than R this session. Pt able to make minimal corrective changes but unable to maintain. Pt continues to run into obstacles on the L with the RW.    Stairs             Wheelchair Mobility    Modified Rankin (Stroke Patients Only)       Balance Overall balance assessment: Needs assistance;History of Falls Sitting-balance support: Feet supported;No upper extremity supported Sitting balance-Leahy Scale: Fair     Standing balance support: During functional activity;Bilateral upper extremity supported Standing balance-Leahy Scale: Fair Standing balance comment: Able to maintain static standing                            Cognition Arousal/Alertness: Awake/alert Behavior During Therapy: WFL for tasks assessed/performed Overall Cognitive Status: Impaired/Different from baseline Area of Impairment: Memory;Safety/judgement;Problem solving;Attention;Following commands                 Orientation Level: Disoriented to;Situation Current Attention Level: Selective Memory: Decreased short-term memory Following Commands: Follows one step commands with increased time;Follows multi-step commands inconsistently Safety/Judgement: Decreased awareness of safety;Decreased awareness of deficits   Problem Solving: Slow processing;Difficulty sequencing;Requires tactile cues        Exercises      General Comments        Pertinent Vitals/Pain Pain Assessment: No/denies pain    Home Living                      Prior Function            PT Goals (current goals can now be found in the care plan section) Acute Rehab PT Goals  Patient Stated Goal: to go home PT Goal Formulation: With patient Time For Goal Achievement: 10/20/18 Potential to Achieve Goals: Good Progress towards PT goals: Progressing toward goals    Frequency    Min 2X/week      PT Plan Current plan remains appropriate     Co-evaluation              AM-PAC PT "6 Clicks" Mobility   Outcome Measure  Help needed turning from your back to your side while in a flat bed without using bedrails?: A Little Help needed moving from lying on your back to sitting on the side of a flat bed without using bedrails?: A Little Help needed moving to and from a bed to a chair (including a wheelchair)?: A Little Help needed standing up from a chair using your arms (e.g., wheelchair or bedside chair)?: A Little Help needed to walk in hospital room?: A Little Help needed climbing 3-5 steps with a railing? : A Lot 6 Click Score: 17    End of Session Equipment Utilized During Treatment: Gait belt Activity Tolerance: Patient tolerated treatment well Patient left: in chair;with chair alarm set;with call bell/phone within reach Nurse Communication: Mobility status PT Visit Diagnosis: Unsteadiness on feet (R26.81);Difficulty in walking, not elsewhere classified (R26.2)     Time: 8978-4784 PT Time Calculation (min) (ACUTE ONLY): 21 min  Charges:  $Gait Training: 8-22 mins                     Rolinda Roan, PT, DPT Acute Rehabilitation Services Pager: [phone] Office: [phone]    Thelma Comp 10/18/2018, 2:42 PM
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
## 2018-10-18 NOTE — Telephone Encounter (Signed)
Per the pts chart he is still in the hospital at this time. We will call tomorrow.

## 2018-10-18 NOTE — Plan of Care (Signed)
  Problem: Education: Goal: Knowledge of General Education information will improve Description: Including pain rating scale, medication(s)/side effects and non-pharmacologic comfort measures Outcome: Progressing   Problem: Coping: Goal: Level of anxiety will decrease Outcome: Progressing   

## 2018-10-18 NOTE — Progress Notes (Addendum)
[facility] Progress Note    Assessment/ Plan:   1.  Chronic kidney disease stage IV with a baseline serum creatinine of 3 in March 2019.  - Need f/u with [facility] in 2-4 weeks after discharge  2.  Acute kidney injury.  His creatinine was around 5.2 on admission in the setting of diarrhea.  He has stabilized into the fours.  The patient does not want dialysis, although currently, there is no indication for this.  Renal function is currently stable.   - Needs follow-up in the nephrology office.  No need for Foley catheter (currently condom cath). - - Okay for disposition from a renal standpoint. - Restrict the left arm; will request for permanent access by [facility] to avoid future initiation w/ a catheter. - Already very advanced CKD and no need for a biopsy; likely from DM + HTN.  3.  Hypertension.  Blood pressures are variable.  Losartan and HCTZ are on hold.  If blood pressures remain elevated, would begin beta-blockade.  4.  Proteinuria.  SPEP was negative.  Likely secondary to diabetes mellitus.  5.  Diarrhea.  Resolved.  6.  Metabolic acidosis.  Likely secondary to chronic kidney disease and diarrhea.  Improved.  Will reduce the dose of the sodium bicarbonate.  7.  Complex renal cyst.  Needs follow-up in 6 months.  8.  Change in mental status.  Patient has had a decline in his functional status.  Not clearly related to uremia.  Off statin for this reason per the family request.  Would benefit from short-term rehab if able.  Subjective:   Ambulating. No BM yesterday Denies f/c/n/v   Objective:   BP (!) 163/80 (BP Location: Right Arm)   Pulse 60   Temp 98.3 F (36.8 C) (Oral)   Resp 18   Ht 5\' 7"  (1.702 m)   Wt 82 kg   SpO2 99%   BMI 28.31 kg/m   Intake/Output Summary (Last 24 hours) at 10/18/2018 7893 Last data filed at 10/17/2018 2200 Gross per 24 hour  Intake 840 ml  Output 250 ml  Net 590 ml   Weight change: -0.2 kg  Physical Exam: General:  AAOx3  NAD CV:  Heart RRR  Lungs:  L/S CTA bilaterally Abd:  abd SNT/ND with normal BS GU:  Bladder non-palpable positive Foley (external) Extremities:  No LE edema.  Imaging: No results found.  Labs: BMET Recent Labs  Lab 10/12/18 1201 10/12/18 1607 10/13/18 1528 10/14/18 0820 10/15/18 0714 10/17/18 0715 10/18/18 0713  NA 139 138 138 138 139 137 137  K 4.2 4.4 4.2 3.7 4.1 4.5 3.6  CL 112* 111 108 109 108 108 103  CO2 17* 17* 19* 21* 24 22 22   GLUCOSE 145* 110* 204* 163* 109* 121* 138*  BUN 49* 52* 47* 47* 45* 46* 43*  CREATININE 4.03* 3.92* 4.52* 4.74* 4.86* 5.02* 5.19*  CALCIUM 8.8* 8.8* 8.3* 8.5* 8.9 8.8* 8.5*  PHOS  --   --   --   --  3.0  --   --    CBC Recent Labs  Lab 10/13/18 1528 10/14/18 1106 10/15/18 0955 10/18/18 0713  WBC 8.8 9.3 8.5 8.4  HGB 11.2* 12.4* 11.8* 12.1*  HCT 32.6* 37.0* 35.5* 36.0*  MCV 95.0 97.1 96.2 96.8  PLT PLATELET CLUMPS NOTED ON SMEAR, UNABLE TO ESTIMATE 372 342 356    Medications:    . amiodarone  400 mg Oral Daily  . amLODipine  10 mg Oral Daily  .  aspirin EC  81 mg Oral Daily  . calcium carbonate  1 tablet Oral Once  . cloNIDine  0.1 mg Oral BID  . feeding supplement (ENSURE ENLIVE)  237 mL Oral BID BM  . heparin  5,000 Units Subcutaneous Q8H  . hydrALAZINE  100 mg Oral TID  . metoprolol tartrate  150 mg Oral BID  . pantoprazole  20 mg Oral Daily  . rosuvastatin  10 mg Oral q1800  . sodium bicarbonate  650 mg Oral BID      Otelia Santee, MD 10/18/2018, 9:26 AM

## 2018-10-19 ENCOUNTER — Inpatient Hospital Stay (HOSPITAL_COMMUNITY): Payer: Medicare Other

## 2018-10-19 DIAGNOSIS — I1311 Hypertensive heart and chronic kidney disease without heart failure, with stage 5 chronic kidney disease, or end stage renal disease: Secondary | ICD-10-CM

## 2018-10-19 DIAGNOSIS — N185 Chronic kidney disease, stage 5: Secondary | ICD-10-CM

## 2018-10-19 DIAGNOSIS — Z01810 Encounter for preprocedural cardiovascular examination: Secondary | ICD-10-CM

## 2018-10-19 LAB — CBC
HCT: 37.6 % — ABNORMAL LOW (ref 39.0–52.0)
Hemoglobin: 12.8 g/dL — ABNORMAL LOW (ref 13.0–17.0)
MCH: 32 pg (ref 26.0–34.0)
MCHC: 34 g/dL (ref 30.0–36.0)
MCV: 94 fL (ref 80.0–100.0)
Platelets: UNDETERMINED 10*3/uL (ref 150–400)
RBC: 4 MIL/uL — ABNORMAL LOW (ref 4.22–5.81)
RDW: 12.3 % (ref 11.5–15.5)
WBC: 8.4 10*3/uL (ref 4.0–10.5)
nRBC: 0 % (ref 0.0–0.2)

## 2018-10-19 LAB — BASIC METABOLIC PANEL
Anion gap: 9 (ref 5–15)
BUN: 48 mg/dL — ABNORMAL HIGH (ref 8–23)
CO2: 23 mmol/L (ref 22–32)
Calcium: 8.8 mg/dL — ABNORMAL LOW (ref 8.9–10.3)
Chloride: 102 mmol/L (ref 98–111)
Creatinine, Ser: 5.39 mg/dL — ABNORMAL HIGH (ref 0.61–1.24)
GFR calc Af Amer: 11 mL/min — ABNORMAL LOW (ref >60)
GFR calc non Af Amer: 9 mL/min — ABNORMAL LOW (ref >60)
Glucose, Bld: 112 mg/dL — ABNORMAL HIGH (ref 70–99)
Potassium: 5.5 mmol/L — ABNORMAL HIGH (ref 3.5–5.1)
Sodium: 134 mmol/L — ABNORMAL LOW (ref 135–145)

## 2018-10-19 LAB — GLUCOSE, CAPILLARY
Glucose-Capillary: 114 mg/dL — ABNORMAL HIGH (ref 70–99)
Glucose-Capillary: 149 mg/dL — ABNORMAL HIGH (ref 70–99)
Glucose-Capillary: 145 mg/dL — ABNORMAL HIGH (ref 70–99)
Glucose-Capillary: 103 mg/dL — ABNORMAL HIGH (ref 70–99)

## 2018-10-19 MED ORDER — ONDANSETRON HCL 4 MG PO TABS
4.0000 mg | ORAL_TABLET | Freq: Once | ORAL | Status: AC | PRN
  Administered 2018-10-21: 4 mg via ORAL
  Filled 2018-10-19: qty 1

## 2018-10-19 MED ORDER — SODIUM CHLORIDE 0.9 % IV SOLN
INTRAVENOUS | Status: DC
  Administered 2018-10-19 – 2018-10-20 (×2): via INTRAVENOUS

## 2018-10-19 MED ORDER — CEFAZOLIN SODIUM-DEXTROSE 2-4 GM/100ML-% IV SOLN
2.0000 g | INTRAVENOUS | Status: AC
  Administered 2018-10-20: 2 g via INTRAVENOUS
  Filled 2018-10-19: qty 100

## 2018-10-19 MED ORDER — POLYETHYLENE GLYCOL 3350 17 G PO PACK
17.0000 g | PACK | Freq: Every day | ORAL | Status: DC
  Administered 2018-10-19 – 2018-10-20 (×2): 17 g via ORAL
  Filled 2018-10-19: qty 1

## 2018-10-19 MED ORDER — CEFAZOLIN SODIUM-DEXTROSE 2-4 GM/100ML-% IV SOLN: 2.0000 g | INTRAVENOUS | Status: DC

## 2018-10-19 MED ORDER — ALUM & MAG HYDROXIDE-SIMETH 200-200-20 MG/5ML PO SUSP
30.0000 mL | Freq: Once | ORAL | Status: AC
  Administered 2018-10-19: 30 mL via ORAL
  Filled 2018-10-19: qty 30

## 2018-10-19 NOTE — Progress Notes (Signed)
OT Cancellation Note  Patient Details Name: William [name] MRN: [id number] DOB: 10/16/1940   Cancelled Treatment:    Reason Eval/Treat Not Completed: Other (comment). Pt agreeable to working with OT; however, transporter came to take him for vein mapping. Will reattempt later as schedule permits.  [name] 10/19/2018, 11:29 AM  Lesle Chris, OTR/L Acute Rehabilitation Services [phone] [facility] pager [phone] office 10/19/2018

## 2018-10-19 NOTE — Progress Notes (Signed)
UE vein mapping       has been completed. Preliminary results can be found under CV proc through chart review. Drago Hammonds, BS, RDMS, RVT   

## 2018-10-19 NOTE — Progress Notes (Signed)
Informed by lab tech. Pt refusing AM bloodwork. Susie Cassette RN

## 2018-10-19 NOTE — Telephone Encounter (Signed)
**Note De-Identified  Obfuscation** The pt is still in the hospital. We will call once discharged.

## 2018-10-19 NOTE — Progress Notes (Addendum)
Family Medicine Teaching Service Daily Progress Note Intern Pager: [phone]  Patient name: William [name] Medical record number: [id number] Date of birth: 07/06/1941 Age: 78 y.o. Gender: male  Primary Care Provider: Patient, No Pcp Per Consultants: None Code Status: Full  Pt Overview and Major Events to Date:  12/24: patient admitted for headache, fall, hypertensive emergency 01/03: meeting with nephro and palliative, decision to f/u outpt and not proceed with HD  Assessment and Plan: William [name] is a 78 year old male presenting after a fall with a headache, found to have altered mental status, AKI, and hypertensive urgency.  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  AKI on CKD stage IV:   Patient will follow up with nephrology as outpatient.  Now planning for HD, will follow with nephrology as outpatient.  Vascular surgery consulted and will do vein mapping today. - Nephrology following, rec OP f/u - Avoid nephrotoxic medications as possible, holding home losartan and HCTZ - Strict I&O -Vein mapping today -Vascular planning for access tomorrow -500 mL's IV per nephro -Ancef today prior to procedure tomorrow  CAD  Left ventricular hypertrophy: Chronic. T-99 inconclusive.  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 History of complete heart block with pacemaker in place. Troponin elevation related to CKD per cards. Per cards, if patient does not want HD, cannot do cath as would make him ESRD.  Placed on Amiodarone 400mg  QD and will f/u with Dr. Radford Pax as outpatient in 1-2 weeks.  Continues to deny CP, SOB. - Telemetry - Continue Lopressor 150 mg BID, amlodipine 10 mg QD, and Hydralazine 100 mg TID - Continue amiodarone 400mg  QD - Follow up with Dr. 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  Remains able to discuss medical care and  remembers previous conversations.   - Follow-up with social work on SNF placement   Hypertension Stable.  At home on amlodipine, hydral, lopressor. Holding home losartan, HCTZ per nephrology.  Patient started on clonidine 0.1 mg twice daily yesterday, BP this a.m. 130/72. -Continue clonidine 0.1mg  BID -Continue to hold losartan and hydrochlorothiazide per nephrology  Non-sustained ventricular tachycardia: Resolved. Had episodes of recurrent nonsustained runs of V. tach.  Still unclear cause, but no further cardiac workup at this time as patient does not want HD and cath would cause patient to become ESRD - See plan for LVH  Type 2 Diabetes Mellitus: Chronic, stable. A1c 6.3.  CBG 114 this a.m.  Sliding scale insulin discontinued yesterday due to good control and hypoglycemic event. - CBGs before meals and nightly - Holding home Janumet and Januvia while inpatient (however per PCP docs, Janumet was D/C in 10/2017). Would likely not continue janumet on d/c given CKD - plan to change januvia to Tradjenta on d/c as is not metabolized by the kidneys  Sacral ulcer secondary to fungal infection: Per wound care, leave dressings off of the affected area and apply antifungal powder every time patient is turned.  These instructions have been discussed with the patient. - Continue to monitor   Constipation Patient complains of mild diffuse abdominal pain this AM, has not had BM since 1/3. - start miralax QD  FEN/GI: Carb modified diet Prophylaxis: Lovenox for decreased creatinine clearance  Disposition:  Patient will DC to SNF, vein mapping planned for today, vascular planning for access tomorrow  Subjective:  Patient notes that he is having some abdominal  pain last night and this morning.  States that he is constipated has not had a bowel movement since the third.  Otherwise has no complaints.  No chest pain or shortness of breath.   Objective: Temp:  [98.3 F (36.8 C)-98.5 F (36.9 C)] 98.4  F (36.9 C) (01/08 0502) Pulse Rate:  [59-70] 60 (01/08 0502) Resp:  [18] 18 (01/08 0502) BP: (130-143)/(60-76) 130/72 (01/08 0502) SpO2:  [99 %-100 %] 99 % (01/08 0502) Weight:  [82 kg] 82 kg (01/07 2145)  Physical Exam: General: 78 y.o. male in NAD Cardio: RRR no m/r/g Lungs: CTAB, no wheezing, no rhonchi, no crackles, no increased work of breathing Abdomen: Soft, mildly diffusely tender to palpation, positive bowel sounds Skin: warm and dry Extremities: No edema    Laboratory: Recent Labs  Lab 10/14/18 1106 10/15/18 0955 10/18/18 0713  WBC 9.3 8.5 8.4  HGB 12.4* 11.8* 12.1*  HCT 37.0* 35.5* 36.0*  PLT 372 342 356   Recent Labs  Lab 10/15/18 0714 10/17/18 0715 10/18/18 0713  NA 139 137 137  K 4.1 4.5 3.6  CL 108 108 103  CO2 24 22 22   BUN 45* 46* 43*  CREATININE 4.86* 5.02* 5.19*  CALCIUM 8.9 8.8* 8.5*  GLUCOSE 109* 121* 138*   Imaging/Diagnostic Tests: Dg Chest 2 View  Result Date: 10/04/2018 CLINICAL DATA:  Confusion, fatigue, rule out occult infection. Pt only c/o diarrhea to xray staff, stating he's had diarrhea for weeks. Hx of HTN, DM.confusion, fatigue, rule out occult infection EXAM: CHEST - 2 VIEW COMPARISON:  10/18/2017 FINDINGS: LEFT-sided pacemaker overlies normal cardiac silhouette. No effusion, infiltrate, pneumothorax. Degenerative osteophytosis of the spine. IMPRESSION: No acute cardiopulmonary process. Electronically Signed   By: Suzy Bouchard M.D.   On: 10/04/2018 18:31   Ct Head Wo Contrast  Result Date: 10/04/2018 CLINICAL DATA:  fall, AMS, hx of brain surgeryAltered level of consciousness (LOC), unexplained fall, AMS, hx of brain surgery EXAM: CT HEAD WITHOUT CONTRAST TECHNIQUE: Contiguous axial images were obtained from the base of the skull through the vertex without intravenous contrast. COMPARISON:  None. FINDINGS: Brain: No acute intracranial hemorrhage. No focal mass lesion. No CT evidence of acute infarction. No midline shift or  mass effect. No hydrocephalus. Basilar cisterns are patent. There are periventricular and subcortical white matter hypodensities. Generalized cortical atrophy. Vascular: No hyperdense vessel or unexpected calcification. Skull: Prior RIGHT craniotomy. Sinuses/Orbits: Orbits normal. Coastal thickening in the ethmoid sinuses Other: None IMPRESSION: 1. No acute intracranial findings. 2. Atrophy and white matter microvascular disease. Electronically Signed   By: Suzy Bouchard M.D.   On: 10/04/2018 19:56   Nm Tumor Localization W Spect  Result Date: 10/13/2018 CLINICAL DATA:  HEART FAILURE. 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 EXAM: NUCLEAR MEDICINE TUMOR LOCALIZATION. PYP CARDIAC AMYLOIDOSIS SCAN WITH SPECT TECHNIQUE: Following intravenous administration of radiopharmaceutical, anterior planar images of the chest were obtained. Regions of interest were placed on the heart and contralateral chest wall for quantitative assessment. Additional SPECT imaging of the chest was obtained. RADIOPHARMACEUTICALS:  19.2 mCi TECHNETIUM 99 PYROPHOSPHATE FINDINGS: Planar Visual assessment: Anterior planar imaging demonstrates radiotracer uptake within the heart less than than uptake within the adjacent ribs (Grade 0). Quantitative assessment : Quantitative assessment of the cardiac uptake compared to the contralateral chest wall is equal to (H/CL = 1.39). SPECT assessment: SPECT imaging of the chest demonstrates mild radiotracer accumulation within the LEFT ventricle. IMPRESSION: Visual and quantitative assessment (grade 0, H/CLL equal 1.3) are equivocal for transthyretin amyloidosis. Electronically Signed  By: Kerby Moors M.D.   On: 10/13/2018 13:01   US Renal  Result Date: 10/05/2018 CLINICAL DATA:  Acute onset of renal insufficiency. EXAM: RENAL / URINARY TRACT ULTRASOUND COMPLETE COMPARISON:  None. FINDINGS: Right Kidney: Renal measurements: 10.6 x 6.0 x 6.0 cm = volume: 202.9 mL. Increased parenchymal echogenicity is  noted. A 1.5 cm cyst is noted at the interpole region of the right kidney. No hydronephrosis visualized. Left Kidney: Renal measurements: 9.6 x 4.9 x 5.2 cm = volume: 127.3 mL. Increased parenchymal echogenicity is noted. Cysts are noted at the lower pole and upper pole of the left kidney, measuring 3.2 cm and 1.5 cm in size. The larger cyst has a septation and question of a peripheral soft tissue component. No hydronephrosis visualized. Bladder: Appears normal for degree of bladder distention. IMPRESSION: 1. No evidence of hydronephrosis. 2. Increased renal parenchymal echogenicity raises concern for medical renal disease. 3. Bilateral renal cysts. A somewhat complex 3.2 cm cystic lesion at the lower pole of the left kidney has a septation and question of a peripheral soft tissue component. Contrast-enhanced CT or MRI would be helpful for further evaluation, on an elective nonemergent basis. Electronically Signed   By: Garald Balding M.D.   On: 10/05/2018 00:40    Herold Salguero, Bernita Raisin, DO 10/19/2018, 7:49 AM PGY-1, [place] Intern pager: [phone], text pages welcome
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
## 2018-10-19 NOTE — Progress Notes (Signed)
[facility] Progress Note    Assessment/ Plan:   1. Chronic kidney disease stage IV with a baseline serum creatinine of 3 in March 2019.  - Need f/u with [facility] in 2-4 weeks after discharge  2. Acute kidney injury. His creatinine was around 5.2 on admission in the setting of diarrhea. He has stabilized into the fours. The patient does not want dialysis, although currently, there is no indication for this.   - Needs follow-up in the nephrology office. No need for Foley catheter (currently condom cath).   - Restrict the left arm; appreciate Dr. Oneida Alar ([facility]) seeing the pt so promptly for a perm access. He may still req a catheter if he cont to progress.   - Already very advanced CKD and no need for a biopsy; likely from DM + HTN.  - WIll give 56ml IV x1 - ALso check bladder scan  3. Hypertension. Blood pressures are variable. Losartan and HCTZ are on hold. If blood pressures remain elevated, would begin beta-blockade.  4. Proteinuria. SPEP was negative. Likely secondary to diabetes mellitus.  5. Diarrhea. Resolved.  6. Metabolic acidosis. Likely secondary to chronic kidney disease and diarrhea. Improved. Will reduce the dose of the sodium bicarbonate.  7. Complex renal cyst. Needs follow-up in 6 months.  8. Change in mental status. Patient has had a decline in his functional status. Not clearly related to uremia. Off statin for this reason per the family request. Would benefit from short-term rehab if able.  Subjective:   Ambulating. No BM yesterday Denies f/c. Nauseous last night, no vomiting    Objective:   BP 130/72 (BP Location: Right Arm)   Pulse 60   Temp 98.4 F (36.9 C) (Oral)   Resp 18   Ht 5\' 7"  (1.702 m)   Wt 82 kg   SpO2 99%   BMI 28.31 kg/m   Intake/Output Summary (Last 24 hours) at 10/19/2018 0813 Last data filed at 10/19/2018 0502 Gross per 24 hour  Intake 340 ml  Output 500 ml  Net -160 ml   Weight  change: 0 kg  Physical Exam: General: AAOx3 NAD CV: Heart RRR  Lungs: L/S CTA bilaterally Abd: abd SNT/ND with normal BS GU: Bladder non-palpable positive Foley (external) Extremities: No LE edema.  Imaging: No results found.  Labs: BMET Recent Labs  Lab 10/12/18 1201 10/12/18 1607 10/13/18 1528 10/14/18 0820 10/15/18 0714 10/17/18 0715 10/18/18 0713  NA 139 138 138 138 139 137 137  K 4.2 4.4 4.2 3.7 4.1 4.5 3.6  CL 112* 111 108 109 108 108 103  CO2 17* 17* 19* 21* 24 22 22   GLUCOSE 145* 110* 204* 163* 109* 121* 138*  BUN 49* 52* 47* 47* 45* 46* 43*  CREATININE 4.03* 3.92* 4.52* 4.74* 4.86* 5.02* 5.19*  CALCIUM 8.8* 8.8* 8.3* 8.5* 8.9 8.8* 8.5*  PHOS  --   --   --   --  3.0  --   --    CBC Recent Labs  Lab 10/13/18 1528 10/14/18 1106 10/15/18 0955 10/18/18 0713  WBC 8.8 9.3 8.5 8.4  HGB 11.2* 12.4* 11.8* 12.1*  HCT 32.6* 37.0* 35.5* 36.0*  MCV 95.0 97.1 96.2 96.8  PLT PLATELET CLUMPS NOTED ON SMEAR, UNABLE TO ESTIMATE 372 342 356    Medications:    . amiodarone  400 mg Oral Daily  . amLODipine  10 mg Oral Daily  . aspirin EC  81 mg Oral Daily  . calcium carbonate  1 tablet Oral  Once  . cloNIDine  0.1 mg Oral BID  . feeding supplement (ENSURE ENLIVE)  237 mL Oral BID BM  . heparin  5,000 Units Subcutaneous Q8H  . hydrALAZINE  100 mg Oral TID  . metoprolol tartrate  150 mg Oral BID  . pantoprazole  20 mg Oral Daily  . rosuvastatin  10 mg Oral q1800  . sodium bicarbonate  650 mg Oral BID      Otelia Santee, MD 10/19/2018, 8:13 AM

## 2018-10-20 ENCOUNTER — Encounter (HOSPITAL_COMMUNITY)
Admission: EM | Disposition: A | Discharge status: Home Health | Payer: Self-pay | Source: Home / Self Care | Attending: Family Medicine

## 2018-10-20 ENCOUNTER — Inpatient Hospital Stay (HOSPITAL_COMMUNITY): Payer: Medicare Other | Admitting: Anesthesiology

## 2018-10-20 ENCOUNTER — Encounter (HOSPITAL_COMMUNITY): Payer: Self-pay | Admitting: *Deleted

## 2018-10-20 DIAGNOSIS — N184 Chronic kidney disease, stage 4 (severe): Secondary | ICD-10-CM

## 2018-10-20 HISTORY — PX: AV FISTULA PLACEMENT: SHX1204

## 2018-10-20 LAB — CBC
HCT: 32.7 % — ABNORMAL LOW (ref 39.0–52.0)
Hemoglobin: 11 g/dL — ABNORMAL LOW (ref 13.0–17.0)
MCH: 32.5 pg (ref 26.0–34.0)
MCHC: 33.6 g/dL (ref 30.0–36.0)
MCV: 96.7 fL (ref 80.0–100.0)
Platelets: 332 10*3/uL (ref 150–400)
RBC: 3.38 MIL/uL — ABNORMAL LOW (ref 4.22–5.81)
RDW: 12.6 % (ref 11.5–15.5)
WBC: 6.9 10*3/uL (ref 4.0–10.5)
nRBC: 0 % (ref 0.0–0.2)

## 2018-10-20 LAB — GLUCOSE, CAPILLARY
Glucose-Capillary: 116 mg/dL — ABNORMAL HIGH (ref 70–99)
Glucose-Capillary: 97 mg/dL (ref 70–99)
Glucose-Capillary: 113 mg/dL — ABNORMAL HIGH (ref 70–99)
Glucose-Capillary: 124 mg/dL — ABNORMAL HIGH (ref 70–99)
Glucose-Capillary: 98 mg/dL (ref 70–99)

## 2018-10-20 LAB — BASIC METABOLIC PANEL
Anion gap: 7 (ref 5–15)
BUN: 49 mg/dL — ABNORMAL HIGH (ref 8–23)
CO2: 23 mmol/L (ref 22–32)
Calcium: 8.5 mg/dL — ABNORMAL LOW (ref 8.9–10.3)
Chloride: 107 mmol/L (ref 98–111)
Creatinine, Ser: 5.48 mg/dL — ABNORMAL HIGH (ref 0.61–1.24)
GFR calc Af Amer: 11 mL/min — ABNORMAL LOW (ref >60)
GFR calc non Af Amer: 9 mL/min — ABNORMAL LOW (ref >60)
Glucose, Bld: 122 mg/dL — ABNORMAL HIGH (ref 70–99)
Potassium: 4.2 mmol/L (ref 3.5–5.1)
Sodium: 137 mmol/L (ref 135–145)

## 2018-10-20 SURGERY — ARTERIOVENOUS (AV) FISTULA CREATION
Anesthesia: Monitor Anesthesia Care | Site: Arm Upper | Laterality: Right

## 2018-10-20 MED ORDER — SODIUM CHLORIDE 0.9 % IV SOLN
INTRAVENOUS | Status: DC | PRN
  Administered 2018-10-20: 20 ug/min via INTRAVENOUS

## 2018-10-20 MED ORDER — FENTANYL CITRATE (PF) 100 MCG/2ML IJ SOLN: 25.0000 ug | INTRAMUSCULAR | Status: DC | PRN

## 2018-10-20 MED ORDER — TRAMADOL HCL 50 MG PO TABS
50.0000 mg | ORAL_TABLET | Freq: Once | ORAL | Status: AC | PRN
  Administered 2018-10-20: 50 mg via ORAL
  Filled 2018-10-20: qty 1

## 2018-10-20 MED ORDER — ONDANSETRON HCL 4 MG/2ML IJ SOLN
INTRAMUSCULAR | Status: DC | PRN
  Administered 2018-10-20: 4 mg via INTRAVENOUS

## 2018-10-20 MED ORDER — SODIUM CHLORIDE 0.9 % IV SOLN
INTRAVENOUS | Status: DC | PRN
  Administered 2018-10-20: 11:00:00 via INTRAVENOUS

## 2018-10-20 MED ORDER — HEPARIN SODIUM (PORCINE) 1000 UNIT/ML IJ SOLN
INTRAMUSCULAR | Status: AC
  Filled 2018-10-20: qty 1

## 2018-10-20 MED ORDER — 0.9 % SODIUM CHLORIDE (POUR BTL) OPTIME
TOPICAL | Status: DC | PRN
  Administered 2018-10-20: 1000 mL

## 2018-10-20 MED ORDER — FENTANYL CITRATE (PF) 100 MCG/2ML IJ SOLN
INTRAMUSCULAR | Status: DC | PRN
  Administered 2018-10-20 (×2): 25 ug via INTRAVENOUS

## 2018-10-20 MED ORDER — LIDOCAINE-EPINEPHRINE (PF) 1 %-1:200000 IJ SOLN
INTRAMUSCULAR | Status: AC
  Filled 2018-10-20: qty 30

## 2018-10-20 MED ORDER — SENNOSIDES-DOCUSATE SODIUM 8.6-50 MG PO TABS
1.0000 | ORAL_TABLET | Freq: Every day | ORAL | Status: DC
  Administered 2018-10-20: 1 via ORAL
  Filled 2018-10-20: qty 1

## 2018-10-20 MED ORDER — SODIUM CHLORIDE 0.9 % IV SOLN
INTRAVENOUS | Status: DC | PRN
  Administered 2018-10-20: 12:00:00

## 2018-10-20 MED ORDER — PROPOFOL 500 MG/50ML IV EMUL
INTRAVENOUS | Status: DC | PRN
  Administered 2018-10-20: 50 ug/kg/min via INTRAVENOUS

## 2018-10-20 MED ORDER — OXYCODONE HCL 5 MG PO TABS: 5.0000 mg | ORAL_TABLET | Freq: Once | ORAL | Status: DC | PRN

## 2018-10-20 MED ORDER — FENTANYL CITRATE (PF) 250 MCG/5ML IJ SOLN
INTRAMUSCULAR | Status: AC
  Filled 2018-10-20: qty 5

## 2018-10-20 MED ORDER — HEMOSTATIC AGENTS (NO CHARGE) OPTIME
TOPICAL | Status: DC | PRN
  Administered 2018-10-20: 1 via TOPICAL

## 2018-10-20 MED ORDER — OXYCODONE HCL 5 MG/5ML PO SOLN: 5.0000 mg | Freq: Once | ORAL | Status: DC | PRN

## 2018-10-20 MED ORDER — HEPARIN SODIUM (PORCINE) 1000 UNIT/ML IJ SOLN
INTRAMUSCULAR | Status: DC | PRN
  Administered 2018-10-20: 2000 [IU] via INTRAVENOUS
  Administered 2018-10-20: 3000 [IU] via INTRAVENOUS

## 2018-10-20 MED ORDER — ONDANSETRON HCL 4 MG/2ML IJ SOLN: 4.0000 mg | Freq: Once | INTRAMUSCULAR | Status: DC | PRN

## 2018-10-20 MED ORDER — LIDOCAINE-EPINEPHRINE 1 %-1:100000 IJ SOLN
INTRAMUSCULAR | Status: DC | PRN
  Administered 2018-10-20: 14 mL

## 2018-10-20 MED ORDER — SODIUM CHLORIDE 0.9 % IV SOLN
INTRAVENOUS | Status: AC
  Filled 2018-10-20: qty 1.2

## 2018-10-20 SURGICAL SUPPLY — 39 items
ADH SKN CLS APL DERMABOND .7 (GAUZE/BANDAGES/DRESSINGS) ×1
AGENT HMST SPONGE THK3/8 (HEMOSTASIS)
ARMBAND PINK RESTRICT EXTREMIT (MISCELLANEOUS) ×3 IMPLANT
CANISTER SUCT 3000ML PPV (MISCELLANEOUS) ×2 IMPLANT
CLIP VESOCCLUDE MED 6/CT (CLIP) ×2 IMPLANT
CLIP VESOCCLUDE SM WIDE 6/CT (CLIP) ×3 IMPLANT
COVER PROBE W GEL 5X96 (DRAPES) ×2 IMPLANT
COVER WAND RF STERILE (DRAPES) ×2 IMPLANT
DECANTER SPIKE VIAL GLASS SM (MISCELLANEOUS) ×2 IMPLANT
DERMABOND ADVANCED (GAUZE/BANDAGES/DRESSINGS) ×1
DERMABOND ADVANCED .7 DNX12 (GAUZE/BANDAGES/DRESSINGS) ×1 IMPLANT
ELECT REM PT RETURN 9FT ADLT (ELECTROSURGICAL) ×2
ELECTRODE REM PT RTRN 9FT ADLT (ELECTROSURGICAL) ×1 IMPLANT
GLOVE BIO SURGEON STRL SZ7.5 (GLOVE) ×2 IMPLANT
GLOVE BIOGEL PI IND STRL 8 (GLOVE) ×1 IMPLANT
GLOVE BIOGEL PI IND STRL 8.5 (GLOVE) IMPLANT
GLOVE BIOGEL PI INDICATOR 8 (GLOVE) ×1
GLOVE BIOGEL PI INDICATOR 8.5 (GLOVE) ×1
GLOVE SURG SS PI 6.0 STRL IVOR (GLOVE) ×1 IMPLANT
GOWN STRL REUS W/ TWL LRG LVL3 (GOWN DISPOSABLE) ×2 IMPLANT
GOWN STRL REUS W/ TWL XL LVL3 (GOWN DISPOSABLE) ×2 IMPLANT
GOWN STRL REUS W/TWL LRG LVL3 (GOWN DISPOSABLE) ×4
GOWN STRL REUS W/TWL XL LVL3 (GOWN DISPOSABLE) ×4
GRAFT GORETEX STRT 4-7X45 (Vascular Products) ×1 IMPLANT
HEMOSTAT SPONGE AVITENE ULTRA (HEMOSTASIS) IMPLANT
HEMOSTAT SURGICEL 2X14 (HEMOSTASIS) ×1 IMPLANT
KIT BASIN OR (CUSTOM PROCEDURE TRAY) ×2 IMPLANT
KIT TURNOVER KIT B (KITS) ×2 IMPLANT
NS IRRIG 1000ML POUR BTL (IV SOLUTION) ×2 IMPLANT
PACK CV ACCESS (CUSTOM PROCEDURE TRAY) ×2 IMPLANT
PAD ARMBOARD 7.5X6 YLW CONV (MISCELLANEOUS) ×4 IMPLANT
SUT MNCRL AB 4-0 PS2 18 (SUTURE) ×3 IMPLANT
SUT PROLENE 6 0 BV (SUTURE) ×6 IMPLANT
SUT PROLENE 7 0 BV 1 (SUTURE) IMPLANT
SUT VIC AB 3-0 SH 27 (SUTURE) ×4
SUT VIC AB 3-0 SH 27X BRD (SUTURE) ×1 IMPLANT
TOWEL GREEN STERILE (TOWEL DISPOSABLE) ×2 IMPLANT
UNDERPAD 30X30 (UNDERPADS AND DIAPERS) ×2 IMPLANT
WATER STERILE IRR 1000ML POUR (IV SOLUTION) ×2 IMPLANT

## 2018-10-20 NOTE — Anesthesia Procedure Notes (Signed)
Procedure Name: MAC Date/Time: 10/20/2018 11:18 AM Performed by: Neldon Newport, CRNA Pre-anesthesia Checklist: Timeout performed, Patient being monitored, Suction available, Emergency Drugs available and Patient identified Patient Re-evaluated:Patient Re-evaluated prior to induction Oxygen Delivery Method: Simple face mask

## 2018-10-20 NOTE — Anesthesia Preprocedure Evaluation (Signed)
Anesthesia Evaluation  Patient identified by MRN, date of birth, ID band Patient awake    Reviewed: Allergy & Precautions, NPO status , Patient's Chart, lab work & pertinent test results  Airway Mallampati: III  TM Distance: >3 FB     Dental   Pulmonary    breath sounds clear to auscultation       Cardiovascular hypertension,  Rhythm:Regular Rate:Normal     Neuro/Psych    GI/Hepatic   Endo/Other  diabetes  Renal/GU      Musculoskeletal   Abdominal   Peds  Hematology   Anesthesia Other Findings   Reproductive/Obstetrics                             Anesthesia Physical Anesthesia Plan  ASA: III  Anesthesia Plan: MAC   Post-op Pain Management:    Induction: Intravenous  PONV Risk Score and Plan: Ondansetron  Airway Management Planned: Natural Airway and Simple Face Mask  Additional Equipment:   Intra-op Plan:   Post-operative Plan:   Informed Consent: I have reviewed the patients History and Physical, chart, labs and discussed the procedure including the risks, benefits and alternatives for the proposed anesthesia with the patient or authorized representative who has indicated his/her understanding and acceptance.     Plan Discussed with:   Anesthesia Plan Comments: (ESRD has not started HD K-4.2 Type 2 DM glucose-97 Htn Complete Heart Block, S/P [employer] DDD pacer placed in 4/16 at [facility] in [place] EF 70% by ECHO 2016 S/P Craniotomy for subdural 6/16 in [place] will have magnet available.  Roberts Gaudy)        Anesthesia Quick Evaluation

## 2018-10-20 NOTE — Anesthesia Postprocedure Evaluation (Signed)
Anesthesia Post Note  Patient: William [name]  Procedure(s) Performed: ARTERIOVENOUS (AV) FISTULA CREATION VERSUS GRAFT (Right Arm Upper)     Patient location during evaluation: PACU Anesthesia Type: MAC Level of consciousness: awake and alert Pain management: pain level controlled Vital Signs Assessment: post-procedure vital signs reviewed and stable Respiratory status: spontaneous breathing, nonlabored ventilation, respiratory function stable and patient connected to nasal cannula oxygen Cardiovascular status: stable and blood pressure returned to baseline Postop Assessment: no apparent nausea or vomiting Anesthetic complications: no    Last Vitals:  Vitals:   10/20/18 1409 10/20/18 1652  BP: 138/66 127/68  Pulse: 60 62  Resp: 18 17  Temp: 36.6 C 36.5 C  SpO2: 98% 98%    Last Pain:  Vitals:   10/20/18 1652  TempSrc: Oral  PainSc:                  Ivadell Gaul COKER

## 2018-10-20 NOTE — Clinical Social Work Note (Signed)
CSW contacted by admissions director with [facility] and informed that insurance denied ST rehab request as patient does not meed criteria for daily therapy. When asked, Dr. Sandi Carne indicated that she will not pursue an appeal as she feels patient can discharge home with home health. CSW contacted daughter and message at 1:43 pm. CSW talked with daughter at 5:21 pm regarding insurance denial. Another family member was on the line and inquired as to what could be done as patient needs rehab. Family advised that deadline for an appeal had passed (4:30 pm). [employer] contact information was provided to family at their request. CSW talked with daughter regarding the nurse case manager following up with her on Friday to discuss home health services. CSW will talk with nurse case manager on Friday and provide SW intervention services as needed through discharge.  William [name], MSW, LCSW Licensed Clinical Social Worker [place] [phone]

## 2018-10-20 NOTE — Progress Notes (Addendum)
[facility]    +-----------------+-------------+----------+--------------+ Right Cephalic   Diameter (cm)Depth (cm)   Findings    +-----------------+-------------+----------+--------------+ Shoulder             0.14                              +-----------------+-------------+----------+--------------+ Mid upper arm        0.16                              +-----------------+-------------+----------+--------------+ Dist upper arm       0.11                              +-----------------+-------------+----------+--------------+ Antecubital fossa    0.16                              +-----------------+-------------+----------+--------------+ Prox forearm         0.12                              +-----------------+-------------+----------+--------------+ Mid forearm                             not visualized +-----------------+-------------+----------+--------------+  +-----------------+-------------+----------+--------------+ Right Basilic    Diameter (cm)Depth (cm)   Findings    +-----------------+-------------+----------+--------------+ Mid upper arm        0.20        1.15                  +-----------------+-------------+----------+--------------+ Dist upper arm       0.22        0.82                  +-----------------+-------------+----------+--------------+ Antecubital fossa    0.15        0.81     branching    +-----------------+-------------+----------+--------------+ Prox forearm                            not visualized +-----------------+-------------+----------+--------------+  +-----------------+-------------+----------+--------------+ Left Cephalic    Diameter (cm)Depth (cm)   Findings    +-----------------+-------------+----------+--------------+ Shoulder             0.15                               +-----------------+-------------+----------+--------------+ Mid upper arm        0.16                              +-----------------+-------------+----------+--------------+ Dist upper arm                             IV site     +-----------------+-------------+----------+--------------+ Antecubital fossa                       not visualized +-----------------+-------------+----------+--------------+  +-----------------+-------------+----------+---------+ Left Basilic     Diameter (cm)Depth (cm)Findings  +-----------------+-------------+----------+---------+ Mid upper arm        0.22  1.12             +-----------------+-------------+----------+---------+ Dist upper arm       0.21        0.74   branching +-----------------+-------------+----------+---------+ Antecubital fossa    0.21        0.84   branching +-----------------+-------------+----------+---------+  Assessment/Planning: Dialysis access pending  He has small veins will be a candidate for graft   Roxy Horseman 10/20/2018 9:26 AM --  Laboratory Lab Results: Recent Labs    10/19/18 0944 10/20/18 0749  WBC 8.4 6.9  HGB 12.8* 11.0*  HCT 37.6* 32.7*  PLT PLATELET CLUMPS NOTED ON SMEAR, UNABLE TO ESTIMATE 332   BMET Recent Labs    10/19/18 0944 10/20/18 0749  NA 134* 137  K 5.5* 4.2  CL 102 107  CO2 23 23  GLUCOSE 112* 122*  BUN 48* 49*  CREATININE 5.39* 5.48*  CALCIUM 8.8* 8.5*    COAG Lab Results  Component Value Date   INR 1.0 03/30/2007   INR 0.9 03/21/2007   No results found for: PTT  I have seen and evaluated the patient. I agree with the PA note as documented above. Right upper extremity AVG.  No usable superficial vein.  Left pacemaker and want to avoid left side.  [name], MD [facility] Office: [phone] Pager: [phone]

## 2018-10-20 NOTE — Transfer of Care (Signed)
Immediate Anesthesia Transfer of Care Note  Patient: William [name]  Procedure(s) Performed: ARTERIOVENOUS (AV) FISTULA CREATION VERSUS GRAFT (Right Arm Upper)  Patient Location: PACU  Anesthesia Type:MAC  Level of Consciousness: awake, alert  and oriented  Airway & Oxygen Therapy: Patient Spontanous Breathing and Patient connected to nasal cannula oxygen  Post-op Assessment: Report given to RN, Post -op Vital signs reviewed and stable and Patient moving all extremities X 4  Post vital signs: Reviewed and stable  Last Vitals:  Vitals Value Taken Time  BP    Temp    Pulse    Resp    SpO2      Last Pain:  Vitals:   10/20/18 0823  TempSrc: Oral  PainSc:       Patients Stated Pain Goal: 0 ([id number])  Complications: No apparent anesthesia complications

## 2018-10-20 NOTE — Op Note (Addendum)
OPERATIVE NOTE   PROCEDURE: Right upper arm 4 mm x 7 mm tapered AV graft  PRE-OPERATIVE DIAGNOSIS: end stage renal disease   POST-OPERATIVE DIAGNOSIS: Same  SURGEON: [name], MD  ASSISTANT(S): Roxy Horseman, PA  ANESTHESIA: MAC  ESTIMATED BLOOD LOSS: <25 cc  FINDING(S): 1. Palpable thrill at end of case 2. Palpable radial pulse at end of case.  SPECIMEN(S):  None  INDICATIONS:   William [name] is a 78 y.o. male who presents with CKD and need for permanent AV access.  He has no usable superficial vein and a pacemaker on the left side.  Risk, benefits, and alternatives to access surgery were discussed.  The patient is aware the risks include but are not limited to: bleeding, infection, steal syndrome, nerve damage, ischemic monomelic neuropathy, failure to mature, and need for additional procedures.  The patient is aware of the risks and elects to proceed forward.  DESCRIPTION: After full informed written consent was obtained from the patient, the patient was brought back to the operating room and placed supine upon the operating table.  The patient was given IV antibiotics prior to proceeding.  After obtaining adequate sedation, the patient was prepped and draped in standard fashion for a right arm access procedure. Under ultrasound guidance, I identified the location of the brachial artery near the antecubitum and brachial vein near the axilla and marked each of these on the skin.  I injected 1% lidocaine with epinephrine at these location to obtain some anesthesia.  In total, I used 15 mL of this anesthesia at the axillary incision and antecubitum incisions.  I made an longitudinal incision over the brachial artery, and dissected down through the subcutaneous tissue and  fascia carefully and was able to dissect out the brachial artery.  The artery was about 5 mm externally.  It was controlled proximally and distally with vessel loops.  I then dissected out the  brachial vein in the upper arm near the axilla.  Externally, it appeared to be 4 mm in diameter.  I took a Dietitian and dissected from the axillary incision to the antecubital incision.  Then I delivered the 4 x 7-mm stretch Gore-Tex graft, through this metal tunneler and then pulled out the metal tunneler leaving the graft in place.  Leaving the graft in place with the 4-mm end was left on the brachial artery side side and the 7-mm end toward the brachial vein side.  I then gave the patient 5000 units of IV heparin to gain anticoagulation.  After waiting 3 minutes, I placed the brachial artery under tension proximally and distally with vessel loops, made an arteriotomy and extended it with a Potts scissor.  I sewed the 4-mm end of the graft to this arteriotomy with a running stitch of 6-0 Prolene.  At this point, then I completed the anastomosis in the usual fashion.  I released the vessel loops on the inflow and allowed the artery to decompress through the graft. There was good pulsatile bleeding through this graft.  I clamped the graft near its arterial anastomosis and sucked out all the blood in the graft and loaded the graft with heparinized saline.  At this point, I pulled the graft to appropriate length and reset my exposure of the high brachial vein.  I then clamped the brachial vein with profunda clamps.  I opened the brachial vein with an 11 blade scalpel and extended with potts scissors.  Then, I injected some heparinized  saline into this vein and then clamped it.  I then spatulated the graft to facilitate an end-to- side anastomosis.  In the process of spatulating, I cut the graft to appropriate length for this anastomosis.  This graft was sewn to the vein in an end-to-side configuration with a 6-0 Prolene.  Prior to completing this anastomosis, I allowed the vein to back bleed and then I also allowed the artery to bleed in an antegrade fashion.  I completed this anastomosis in the usual  fashion, and then irrigated out the incisions with sterile saline and used surgicel for hemostasis. The distal radial artery was palpable.  The venous outflow had a flow signature consistent with widely patent arteriovenous graft.  After waiting a few minutes, there was no more active bleeding.  The subcutaneous tissue in both incisions was reapproximated with a running stitch of 3-0 Vicryl.  The skin was then reapproximated with a running subcuticular 4-0 Monocryl.  The skin was then cleaned, dried, and Dermabond used to reinforce the skin closure.    COMPLICATIONS: None  CONDITION: Stable   [name], MD [facility] Office: [phone] Pager: [phone]   10/20/2018, 12:43 PM

## 2018-10-20 NOTE — Progress Notes (Addendum)
Family Medicine Teaching Service Daily Progress Note Intern Pager: [phone]  Patient name: William [name] Medical record number: [id number] Date of birth: 02-19-1941 Age: 78 y.o. Gender: male  Primary Care Provider: Patient, No Pcp Per Consultants: None Code Status: Full  Pt Overview and Major Events to Date:  12/24: patient admitted for headache, fall, hypertensive emergency 01/03: meeting with nephro and palliative, decision to f/u outpt and not proceed with HD  Assessment and Plan: William [name] is a 78 year old male presenting after a fall with a headache, found to have altered mental status, AKI, and hypertensive urgency.  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  AKI on CKD stage IV:   Patient follows with nephrology as outpatient.  Planning for HD.  Vein mapping performed yesterday.  Vascular surgery taking for access today. - Nephrology following, rec OP f/u in 2 to 4 weeks after discharge - Avoid nephrotoxic medications as possible, holding home losartan and HCTZ - Strict I&O -Patient receiving access today per vascular -S/p Ancef for preprocedure prophylaxis  CAD  Left ventricular hypertrophy: Chronic. T-99 inconclusive.  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 History of complete heart block with pacemaker in place. Troponin elevation related to CKD per cards. Per cards, if patient does not want HD, cannot do cath as would make him ESRD.  Placed on Amiodarone 400mg  QD and will f/u with Dr. Radford Pax as outpatient in 1-2 weeks.  Patient continues to deny chest pain or shortness of breath. - Telemetry - Continue Lopressor 150 mg BID, amlodipine 10 mg QD, and Hydralazine 100 mg TID - Continue amiodarone 400mg  QD - Follow up with Dr. 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  Remains able to discuss medical care and remembers  previous conversations.   - Follow-up with social work on SNF placement   Hypertension Stable.  At home on amlodipine, hydral, lopressor. Holding home losartan, HCTZ per nephrology.  Clonidine 0.1 mg twice daily added on 1/7.  Patient's blood pressure with continued improvement, 124/60 this a.m. -Continue clonidine 0.1mg  BID -Continue to hold losartan and hydrochlorothiazide per nephrology  Hyperkalemia K of 5.5 yesterday.  Nephrology made aware.  K this a.m. 4.2 -Neurology following, appreciate recommendations  Non-sustained ventricular tachycardia: Resolved. Had episodes of recurrent nonsustained runs of V. tach.  Still unclear cause, but no further cardiac workup at this time as patient does not want HD and cath would cause patient to become ESRD - See plan for LVH  Type 2 Diabetes Mellitus: Chronic, stable. A1c 6.3.  Patient not on sliding scale insulin due to good A1c and hypoglycemic episodes during admission.  CBG this a.m. 116. - CBGs before meals and nightly - Holding home Janumet and Januvia while inpatient (however per PCP docs, Janumet was D/C in 10/2017). Would likely not continue janumet on d/c given CKD - plan to change januvia to Tradjenta on d/c as is not metabolized by the kidneys, or discussed with patient not treating with oral medication as A1c is 6.3  Sacral ulcer secondary to fungal infection: Per wound care, leave dressings off of the affected area and apply antifungal powder every time patient is turned.  These instructions have been discussed with the patient. - Continue to monitor   Constipation Patient continues to endorse mild abdominal pain.  Has not had a bowel movement. -Continue Miralax QD -add senna QD  FEN/GI:  N.p.o. pending vascular access Prophylaxis:  Heparin  Disposition:  DC to SNF pending placement and vascular access today  Subjective:  Patient notes only mild abdominal pain.  States he did not have a BM yesterday.  Has no other  complaints.  Denies chest pain.   Objective: Temp:  [97.6 F (36.4 C)-98.2 F (36.8 C)] 97.8 F (36.6 C) (01/09 0823) Pulse Rate:  [59-60] 60 (01/09 0823) Resp:  [18-20] 18 (01/09 0823) BP: (118-130)/(60-71) 118/68 (01/09 0823) SpO2:  [99 %-100 %] 99 % (01/09 0823) Weight:  [82 kg] 82 kg (01/08 2150)  Physical Exam: General: 78 y.o. male in NAD Cardio: RRR no m/r/g Lungs: CTAB, no wheezing, no rhonchi, no crackles, no increased work of breathing Abdomen: Soft, non-tender to palpation, positive bowel sounds Skin: warm and dry Extremities: No edema   Laboratory: Recent Labs  Lab 10/18/18 0713 10/19/18 0944 10/20/18 0749  WBC 8.4 8.4 6.9  HGB 12.1* 12.8* 11.0*  HCT 36.0* 37.6* 32.7*  PLT 356 PLATELET CLUMPS NOTED ON SMEAR, UNABLE TO ESTIMATE 332   Recent Labs  Lab 10/18/18 0713 10/19/18 0944 10/20/18 0749  NA 137 134* 137  K 3.6 5.5* 4.2  CL 103 102 107  CO2 22 23 23   BUN 43* 48* 49*  CREATININE 5.19* 5.39* 5.48*  CALCIUM 8.5* 8.8* 8.5*  GLUCOSE 138* 112* 122*   Imaging/Diagnostic Tests: Dg Chest 2 View  Result Date: 10/04/2018 CLINICAL DATA:  Confusion, fatigue, rule out occult infection. Pt only c/o diarrhea to xray staff, stating he's had diarrhea for weeks. Hx of HTN, DM.confusion, fatigue, rule out occult infection EXAM: CHEST - 2 VIEW COMPARISON:  10/18/2017 FINDINGS: LEFT-sided pacemaker overlies normal cardiac silhouette. No effusion, infiltrate, pneumothorax. Degenerative osteophytosis of the spine. IMPRESSION: No acute cardiopulmonary process. Electronically Signed   By: Suzy Bouchard M.D.   On: 10/04/2018 18:31   Ct Head Wo Contrast  Result Date: 10/04/2018 CLINICAL DATA:  fall, AMS, hx of brain surgeryAltered level of consciousness (LOC), unexplained fall, AMS, hx of brain surgery EXAM: CT HEAD WITHOUT CONTRAST TECHNIQUE: Contiguous axial images were obtained from the base of the skull through the vertex without intravenous contrast. COMPARISON:   None. FINDINGS: Brain: No acute intracranial hemorrhage. No focal mass lesion. No CT evidence of acute infarction. No midline shift or mass effect. No hydrocephalus. Basilar cisterns are patent. There are periventricular and subcortical white matter hypodensities. Generalized cortical atrophy. Vascular: No hyperdense vessel or unexpected calcification. Skull: Prior RIGHT craniotomy. Sinuses/Orbits: Orbits normal. Coastal thickening in the ethmoid sinuses Other: None IMPRESSION: 1. No acute intracranial findings. 2. Atrophy and white matter microvascular disease. Electronically Signed   By: Suzy Bouchard M.D.   On: 10/04/2018 19:56   Nm Tumor Localization W Spect  Result Date: 10/13/2018 CLINICAL DATA:  HEART FAILURE. 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 EXAM: NUCLEAR MEDICINE TUMOR LOCALIZATION. PYP CARDIAC AMYLOIDOSIS SCAN WITH SPECT TECHNIQUE: Following intravenous administration of radiopharmaceutical, anterior planar images of the chest were obtained. Regions of interest were placed on the heart and contralateral chest wall for quantitative assessment. Additional SPECT imaging of the chest was obtained. RADIOPHARMACEUTICALS:  19.2 mCi TECHNETIUM 99 PYROPHOSPHATE FINDINGS: Planar Visual assessment: Anterior planar imaging demonstrates radiotracer uptake within the heart less than than uptake within the adjacent ribs (Grade 0). Quantitative assessment : Quantitative assessment of the cardiac uptake compared to the contralateral chest wall is equal to (H/CL = 1.39). SPECT assessment: SPECT imaging of the chest demonstrates mild radiotracer accumulation within the LEFT ventricle. IMPRESSION: Visual  and quantitative assessment (grade 0, H/CLL equal 1.3) are equivocal for transthyretin amyloidosis. Electronically Signed   By: Kerby Moors M.D.   On: 10/13/2018 13:01   US Renal  Result Date: 10/05/2018 CLINICAL DATA:  Acute onset of renal insufficiency. EXAM: RENAL / URINARY TRACT ULTRASOUND COMPLETE  COMPARISON:  None. FINDINGS: Right Kidney: Renal measurements: 10.6 x 6.0 x 6.0 cm = volume: 202.9 mL. Increased parenchymal echogenicity is noted. A 1.5 cm cyst is noted at the interpole region of the right kidney. No hydronephrosis visualized. Left Kidney: Renal measurements: 9.6 x 4.9 x 5.2 cm = volume: 127.3 mL. Increased parenchymal echogenicity is noted. Cysts are noted at the lower pole and upper pole of the left kidney, measuring 3.2 cm and 1.5 cm in size. The larger cyst has a septation and question of a peripheral soft tissue component. No hydronephrosis visualized. Bladder: Appears normal for degree of bladder distention. IMPRESSION: 1. No evidence of hydronephrosis. 2. Increased renal parenchymal echogenicity raises concern for medical renal disease. 3. Bilateral renal cysts. A somewhat complex 3.2 cm cystic lesion at the lower pole of the left kidney has a septation and question of a peripheral soft tissue component. Contrast-enhanced CT or MRI would be helpful for further evaluation, on an elective nonemergent basis. Electronically Signed   By: Garald Balding M.D.   On: 10/05/2018 00:40   Vas [place] Upper Ext Vein Mapping (pre-op Avf)  Result Date: 10/19/2018 UPPER EXTREMITY VEIN MAPPING  Indications: Pre-access. Performing Technologist: June Leap RDMS, RVT  Examination Guidelines: A complete evaluation includes B-mode imaging, spectral Doppler, color Doppler, and power Doppler as needed of all accessible portions of each vessel. Bilateral testing is considered an integral part of a complete examination. Limited examinations for reoccurring indications may be performed as noted. +-----------------+-------------+----------+--------------+ Right Cephalic   Diameter (cm)Depth (cm)   Findings    +-----------------+-------------+----------+--------------+ Shoulder             0.14                              +-----------------+-------------+----------+--------------+ Mid upper arm        0.16                               +-----------------+-------------+----------+--------------+ Dist upper arm       0.11                              +-----------------+-------------+----------+--------------+ Antecubital fossa    0.16                              +-----------------+-------------+----------+--------------+ Prox forearm         0.12                              +-----------------+-------------+----------+--------------+ Mid forearm                             not visualized +-----------------+-------------+----------+--------------+ +-----------------+-------------+----------+--------------+ Right Basilic    Diameter (cm)Depth (cm)   Findings    +-----------------+-------------+----------+--------------+ Mid upper arm        0.20        1.15                  +-----------------+-------------+----------+--------------+  Dist upper arm       0.22        0.82                  +-----------------+-------------+----------+--------------+ Antecubital fossa    0.15        0.81     branching    +-----------------+-------------+----------+--------------+ Prox forearm                            not visualized +-----------------+-------------+----------+--------------+ +-----------------+-------------+----------+--------------+ Left Cephalic    Diameter (cm)Depth (cm)   Findings    +-----------------+-------------+----------+--------------+ Shoulder             0.15                              +-----------------+-------------+----------+--------------+ Mid upper arm        0.16                              +-----------------+-------------+----------+--------------+ Dist upper arm                             IV site     +-----------------+-------------+----------+--------------+ Antecubital fossa                       not visualized +-----------------+-------------+----------+--------------+ +-----------------+-------------+----------+---------+  Left Basilic     Diameter (cm)Depth (cm)Findings  +-----------------+-------------+----------+---------+ Mid upper arm        0.22        1.12             +-----------------+-------------+----------+---------+ Dist upper arm       0.21        0.74   branching +-----------------+-------------+----------+---------+ Antecubital fossa    0.21        0.84   branching +-----------------+-------------+----------+---------+ *See table(s) above for measurements and observations.  Diagnosing physician: Harold Barban MD Electronically signed by Harold Barban MD on 10/19/2018 at 6:00:38 PM.    Final     Sandi Carne, Bernita Raisin, DO 10/20/2018, 9:39 AM PGY-1, [facility] Intern pager: [phone], text pages welcome

## 2018-10-21 LAB — GLUCOSE, CAPILLARY
Glucose-Capillary: 85 mg/dL (ref 70–99)
Glucose-Capillary: 207 mg/dL — ABNORMAL HIGH (ref 70–99)
Glucose-Capillary: 132 mg/dL — ABNORMAL HIGH (ref 70–99)

## 2018-10-21 LAB — CBC
HCT: 31.8 % — ABNORMAL LOW (ref 39.0–52.0)
Hemoglobin: 10.4 g/dL — ABNORMAL LOW (ref 13.0–17.0)
MCH: 31.5 pg (ref 26.0–34.0)
MCHC: 32.7 g/dL (ref 30.0–36.0)
MCV: 96.4 fL (ref 80.0–100.0)
Platelets: 301 10*3/uL (ref 150–400)
RBC: 3.3 MIL/uL — ABNORMAL LOW (ref 4.22–5.81)
RDW: 12.6 % (ref 11.5–15.5)
WBC: 9.3 10*3/uL (ref 4.0–10.5)
nRBC: 0 % (ref 0.0–0.2)

## 2018-10-21 LAB — BASIC METABOLIC PANEL
Anion gap: 9 (ref 5–15)
BUN: 47 mg/dL — ABNORMAL HIGH (ref 8–23)
CO2: 20 mmol/L — ABNORMAL LOW (ref 22–32)
Calcium: 8.1 mg/dL — ABNORMAL LOW (ref 8.9–10.3)
Chloride: 106 mmol/L (ref 98–111)
Creatinine, Ser: 5.64 mg/dL — ABNORMAL HIGH (ref 0.61–1.24)
GFR calc Af Amer: 10 mL/min — ABNORMAL LOW (ref >60)
GFR calc non Af Amer: 9 mL/min — ABNORMAL LOW (ref >60)
Glucose, Bld: 86 mg/dL (ref 70–99)
Potassium: 4.4 mmol/L (ref 3.5–5.1)
Sodium: 135 mmol/L (ref 135–145)

## 2018-10-21 MED ORDER — METOPROLOL TARTRATE 75 MG PO TABS: 150.0000 mg | ORAL_TABLET | Freq: Two times a day (BID) | ORAL | 0 refills | Status: DC

## 2018-10-21 MED ORDER — ROSUVASTATIN CALCIUM 10 MG PO TABS: 10.0000 mg | ORAL_TABLET | Freq: Every day | ORAL | 0 refills | Status: DC

## 2018-10-21 MED ORDER — SODIUM BICARBONATE 650 MG PO TABS: 650.0000 mg | ORAL_TABLET | Freq: Two times a day (BID) | ORAL | 0 refills | Status: DC

## 2018-10-21 MED ORDER — ENSURE ENLIVE PO LIQD: 237.0000 mL | Freq: Two times a day (BID) | ORAL | 12 refills | Status: DC

## 2018-10-21 MED ORDER — HYDRALAZINE HCL 100 MG PO TABS: 100.0000 mg | ORAL_TABLET | Freq: Three times a day (TID) | ORAL | 0 refills | Status: DC

## 2018-10-21 MED ORDER — PANTOPRAZOLE SODIUM 20 MG PO TBEC: 20.0000 mg | DELAYED_RELEASE_TABLET | Freq: Every day | ORAL | 0 refills | Status: DC

## 2018-10-21 MED ORDER — AMIODARONE HCL 400 MG PO TABS: 400.0000 mg | ORAL_TABLET | Freq: Every day | ORAL | 0 refills | Status: DC

## 2018-10-21 MED ORDER — POLYETHYLENE GLYCOL 3350 17 G PO PACK: 17.0000 g | PACK | Freq: Every day | ORAL | 0 refills | Status: DC

## 2018-10-21 MED ORDER — CLONIDINE HCL 0.1 MG PO TABS: 0.1000 mg | ORAL_TABLET | Freq: Two times a day (BID) | ORAL | 0 refills | Status: DC

## 2018-10-21 NOTE — Progress Notes (Signed)
PT Cancellation Note  Patient Details Name: William [name] MRN: [id number] DOB: 06-03-1941   Cancelled Treatment:    Reason Eval/Treat Not Completed: Patient declined, no reason specified(Chart reviewed, RN consulted. Attempted treatment/reassessment. Pt refuses OOB at this time, cites significant fatigue and sleepiness, requests to remain in bed and rest. RN made aware. ) Will coordinate with [facility] regarding home equipment needs and services.   1:07 PM, 10/21/18 Etta Grandchild, PT, DPT Physical Therapist - [place] [phone] (Pager)  [phone] (Office)       Buccola,Allan C 10/21/2018, 1:06 PM

## 2018-10-21 NOTE — Telephone Encounter (Signed)
Patient still in the hospital

## 2018-10-21 NOTE — Progress Notes (Signed)
Family Medicine Teaching Service Daily Progress Note Intern Pager: [phone]  Patient name: William [name] Medical record number: [id number] Date of birth: July 05, 1941 Age: 78 y.o. Gender: male  Primary Care Provider: Patient, No Pcp Per Consultants: None Code Status: Full  Pt Overview and Major Events to Date:  12/24: patient admitted for headache, fall, hypertensive emergency 01/03: meeting with nephro and palliative, decision to f/u outpt and not proceed with HD  Assessment and Plan: William [name] is a 78 year old male presenting after a fall with a headache, found to have altered mental status, AKI, and hypertensive urgency.  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  AKI on CKD stage IV:   Patient follows with nephrology as outpatient.  Planning for HD.  Patient received a upper arm graft yesterday.  An access graft in 4 weeks per vascular.  Vascular signed off. - Nephrology following, rec OP f/u in 2 to 4 weeks after discharge - Avoid nephrotoxic medications as possible, holding home losartan and HCTZ - Strict I&O -Patient receiving access today per vascular -S/p Ancef for preprocedure prophylaxis  CAD  Left ventricular hypertrophy: Chronic. T-99 inconclusive.  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 History of complete heart block with pacemaker in place. Troponin elevation related to CKD per cards. Per cards, if patient does not want HD, cannot do cath as would make him ESRD.  Placed on Amiodarone 400mg  QD and will f/u with Dr. Radford Pax as outpatient in 1-2 weeks.  Patient continues to deny chest pain or shortness of breath. - Telemetry - Continue Lopressor 150 mg BID, amlodipine 10 mg QD, and Hydralazine 100 mg TID - Continue amiodarone 400mg  QD - Follow up with Dr. 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  Remains able to discuss  medical care and remembers previous conversations.   - Follow-up with social work on SNF placement   Hypertension Stable.  At home on amlodipine, hydral, lopressor. Holding home losartan, HCTZ per nephrology.  Clonidine 0.1 mg twice daily added on 1/7.  BP this a.m. 127/56. -Continue clonidine 0.1mg  BID -Continue to hold losartan and hydrochlorothiazide per nephrology  Hyperkalemia: Resolved K this AM pending. -Neurology following, appreciate recommendations  Non-sustained ventricular tachycardia: Resolved. Had episodes of recurrent nonsustained runs of V. tach.  Still unclear cause, but no further cardiac workup at this time as patient does not want HD and cath would cause patient to become ESRD - See plan for LVH  Type 2 Diabetes Mellitus: Chronic, stable. A1c 6.3.  Patient not on sliding scale insulin due to good A1c and hypoglycemic episodes during admission.  CBG this AM 85. - CBGs before meals and nightly - Holding home Janumet and Januvia while inpatient (however per PCP docs, Janumet was D/C in 10/2017). Would likely not continue janumet on d/c given CKD - plan to change januvia to Tradjenta on d/c as is not metabolized by the kidneys, or discussed with patient not treating with oral medication as A1c is 6.3  Sacral ulcer secondary to fungal infection: Per wound care, leave dressings off of the affected area and apply antifungal powder every time patient is turned.  These instructions have been discussed with the patient. - Continue to monitor   Constipation Patient continues to endorse mild abdominal pain.  Has not had a bowel movement. -Continue Miralax QD -add senna QD  FEN/GI:  N.p.o. pending vascular  access Prophylaxis:  Heparin  Disposition:  Home with home health today, care management consulted and face-to-face placed, 3 and 1 ordered, is medically stable for discharge  Subjective:  Patient denies complaints this morning.  States that he had some pain following his  graft yesterday.  Both daughter and patient state that they are comfortable with patient going home today with home health.  Objective: Temp:  [97.5 F (36.4 C)-98.2 F (36.8 C)] 98.2 F (36.8 C) (01/10 0537) Pulse Rate:  [60-67] 66 (01/10 0537) Resp:  [16-18] 18 (01/10 0537) BP: (118-166)/(56-75) 122/56 (01/10 0537) SpO2:  [96 %-99 %] 99 % (01/10 0537) Weight:  [81 kg-82 kg] 81 kg (01/09 2107)  Physical Exam: General: 78 y.o. male in NAD Cardio: RRR no m/r/g Lungs: CTAB, no wheezing, no rhonchi, no crackles Abdomen: Soft, non-tender to palpation, positive bowel sounds Skin: warm and dry Extremities: No edema    Laboratory: Recent Labs  Lab 10/18/18 0713 10/19/18 0944 10/20/18 0749  WBC 8.4 8.4 6.9  HGB 12.1* 12.8* 11.0*  HCT 36.0* 37.6* 32.7*  PLT 356 PLATELET CLUMPS NOTED ON SMEAR, UNABLE TO ESTIMATE 332   Recent Labs  Lab 10/18/18 0713 10/19/18 0944 10/20/18 0749  NA 137 134* 137  K 3.6 5.5* 4.2  CL 103 102 107  CO2 22 23 23   BUN 43* 48* 49*  CREATININE 5.19* 5.39* 5.48*  CALCIUM 8.5* 8.8* 8.5*  GLUCOSE 138* 112* 122*   Imaging/Diagnostic Tests: Dg Chest 2 View  Result Date: 10/04/2018 CLINICAL DATA:  Confusion, fatigue, rule out occult infection. Pt only c/o diarrhea to xray staff, stating he's had diarrhea for weeks. Hx of HTN, DM.confusion, fatigue, rule out occult infection EXAM: CHEST - 2 VIEW COMPARISON:  10/18/2017 FINDINGS: LEFT-sided pacemaker overlies normal cardiac silhouette. No effusion, infiltrate, pneumothorax. Degenerative osteophytosis of the spine. IMPRESSION: No acute cardiopulmonary process. Electronically Signed   By: Suzy Bouchard M.D.   On: 10/04/2018 18:31   Ct Head Wo Contrast  Result Date: 10/04/2018 CLINICAL DATA:  fall, AMS, hx of brain surgeryAltered level of consciousness (LOC), unexplained fall, AMS, hx of brain surgery EXAM: CT HEAD WITHOUT CONTRAST TECHNIQUE: Contiguous axial images were obtained from the base of the  skull through the vertex without intravenous contrast. COMPARISON:  None. FINDINGS: Brain: No acute intracranial hemorrhage. No focal mass lesion. No CT evidence of acute infarction. No midline shift or mass effect. No hydrocephalus. Basilar cisterns are patent. There are periventricular and subcortical white matter hypodensities. Generalized cortical atrophy. Vascular: No hyperdense vessel or unexpected calcification. Skull: Prior RIGHT craniotomy. Sinuses/Orbits: Orbits normal. Coastal thickening in the ethmoid sinuses Other: None IMPRESSION: 1. No acute intracranial findings. 2. Atrophy and white matter microvascular disease. Electronically Signed   By: Suzy Bouchard M.D.   On: 10/04/2018 19:56   Nm Tumor Localization W Spect  Result Date: 10/13/2018 CLINICAL DATA:  HEART FAILURE. 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 EXAM: NUCLEAR MEDICINE TUMOR LOCALIZATION. PYP CARDIAC AMYLOIDOSIS SCAN WITH SPECT TECHNIQUE: Following intravenous administration of radiopharmaceutical, anterior planar images of the chest were obtained. Regions of interest were placed on the heart and contralateral chest wall for quantitative assessment. Additional SPECT imaging of the chest was obtained. RADIOPHARMACEUTICALS:  19.2 mCi TECHNETIUM 99 PYROPHOSPHATE FINDINGS: Planar Visual assessment: Anterior planar imaging demonstrates radiotracer uptake within the heart less than than uptake within the adjacent ribs (Grade 0). Quantitative assessment : Quantitative assessment of the cardiac uptake compared to the contralateral chest wall is equal to (H/CL = 1.39). SPECT assessment: SPECT  imaging of the chest demonstrates mild radiotracer accumulation within the LEFT ventricle. IMPRESSION: Visual and quantitative assessment (grade 0, H/CLL equal 1.3) are equivocal for transthyretin amyloidosis. Electronically Signed   By: Kerby Moors M.D.   On: 10/13/2018 13:01   US Renal  Result Date: 10/05/2018 CLINICAL DATA:  Acute onset of renal  insufficiency. EXAM: RENAL / URINARY TRACT ULTRASOUND COMPLETE COMPARISON:  None. FINDINGS: Right Kidney: Renal measurements: 10.6 x 6.0 x 6.0 cm = volume: 202.9 mL. Increased parenchymal echogenicity is noted. A 1.5 cm cyst is noted at the interpole region of the right kidney. No hydronephrosis visualized. Left Kidney: Renal measurements: 9.6 x 4.9 x 5.2 cm = volume: 127.3 mL. Increased parenchymal echogenicity is noted. Cysts are noted at the lower pole and upper pole of the left kidney, measuring 3.2 cm and 1.5 cm in size. The larger cyst has a septation and question of a peripheral soft tissue component. No hydronephrosis visualized. Bladder: Appears normal for degree of bladder distention. IMPRESSION: 1. No evidence of hydronephrosis. 2. Increased renal parenchymal echogenicity raises concern for medical renal disease. 3. Bilateral renal cysts. A somewhat complex 3.2 cm cystic lesion at the lower pole of the left kidney has a septation and question of a peripheral soft tissue component. Contrast-enhanced CT or MRI would be helpful for further evaluation, on an elective nonemergent basis. Electronically Signed   By: Garald Balding M.D.   On: 10/05/2018 00:40   Vas [place] Upper Ext Vein Mapping (pre-op Avf)  Result Date: 10/19/2018 UPPER EXTREMITY VEIN MAPPING  Indications: Pre-access. Performing Technologist: June Leap RDMS, RVT  Examination Guidelines: A complete evaluation includes B-mode imaging, spectral Doppler, color Doppler, and power Doppler as needed of all accessible portions of each vessel. Bilateral testing is considered an integral part of a complete examination. Limited examinations for reoccurring indications may be performed as noted. +-----------------+-------------+----------+--------------+ Right Cephalic   Diameter (cm)Depth (cm)   Findings    +-----------------+-------------+----------+--------------+ Shoulder             0.14                               +-----------------+-------------+----------+--------------+ Mid upper arm        0.16                              +-----------------+-------------+----------+--------------+ Dist upper arm       0.11                              +-----------------+-------------+----------+--------------+ Antecubital fossa    0.16                              +-----------------+-------------+----------+--------------+ Prox forearm         0.12                              +-----------------+-------------+----------+--------------+ Mid forearm                             not visualized +-----------------+-------------+----------+--------------+ +-----------------+-------------+----------+--------------+ Right Basilic    Diameter (cm)Depth (cm)   Findings    +-----------------+-------------+----------+--------------+ Mid upper arm        0.20  1.15                  +-----------------+-------------+----------+--------------+ Dist upper arm       0.22        0.82                  +-----------------+-------------+----------+--------------+ Antecubital fossa    0.15        0.81     branching    +-----------------+-------------+----------+--------------+ Prox forearm                            not visualized +-----------------+-------------+----------+--------------+ +-----------------+-------------+----------+--------------+ Left Cephalic    Diameter (cm)Depth (cm)   Findings    +-----------------+-------------+----------+--------------+ Shoulder             0.15                              +-----------------+-------------+----------+--------------+ Mid upper arm        0.16                              +-----------------+-------------+----------+--------------+ Dist upper arm                             IV site     +-----------------+-------------+----------+--------------+ Antecubital fossa                       not visualized  +-----------------+-------------+----------+--------------+ +-----------------+-------------+----------+---------+ Left Basilic     Diameter (cm)Depth (cm)Findings  +-----------------+-------------+----------+---------+ Mid upper arm        0.22        1.12             +-----------------+-------------+----------+---------+ Dist upper arm       0.21        0.74   branching +-----------------+-------------+----------+---------+ Antecubital fossa    0.21        0.84   branching +-----------------+-------------+----------+---------+ *See table(s) above for measurements and observations.  Diagnosing physician: Harold Barban MD Electronically signed by Harold Barban MD on 10/19/2018 at 6:00:38 PM.    Final     Sandi Carne, Bernita Raisin, DO 10/21/2018, 8:04 AM PGY-1, [place] Intern pager: [phone], text pages welcome

## 2018-10-21 NOTE — Progress Notes (Signed)
[facility] Progress Note    Assessment/ Plan:   78 y/o man CKD4 BL Cr 3 12/2017 DM HTN CASHD LD PM p/w fall found w/ AMS, AKI and hypertensive urgency.   1. Chronic kidney disease stage IV with a baseline serum creatinine of 3 in March 2019.  - Need f/u with [facility] in 2-4  weeks after discharge  2. Acute kidney injury. His creatinine was around 5.2 on admission in the setting of diarrhea. He has stabilized into the fours. The patient does not want dialysis, although currently, there is no indication for this.   -Needs follow-up in the nephrology office. No need for Foley catheter(currently condom cath).  - Restrict the left arm;appreciate Dr. Oneida Alar ([facility]) seeing the pt so promptly for a perm access. RUA AVG  Placed 1/9.  - He may still req a catheter if he cont to progress but as long as there is no absolute indication to initiate dialysis (there is not at this time) I would wait at least a month to avoid a catheter.   - Already very advanced CKD and no need for a biopsy; likely from DM + HTN.  - Bladder scan only 66ml on 1/8  3. Hypertension. Blood pressures are variable. Losartan and HCTZ are on hold. If blood pressures remain elevated, would begin beta-blockade.  4. Proteinuria. SPEP was negative. Likely secondary to diabetes mellitus.  5. Diarrhea. Resolved.  6. Metabolic acidosis. Likely secondary to chronic kidney disease and diarrhea. Improved. Will reduce the dose of the sodium bicarbonate.  7. Complex renal cyst. Needs follow-up in 6 months.  8. Change in mental status. Patient has had a decline in his functional status. Not clearly related to uremia. Off statin for this reason per the family request. Would benefit from short-term rehab if able.  Subjective:   Ambulating. No BM yesterday Denies f/c. Nausea has resolved   C/O pain in the right arm overnight but improved this AM   Objective:   BP (!) 122/56  (BP Location: Left Arm)   Pulse 66   Temp 98.2 F (36.8 C) (Oral)   Resp 18   Ht 5\' 7"  (1.702 m)   Wt 81 kg   SpO2 99%   BMI 27.97 kg/m   Intake/Output Summary (Last 24 hours) at 10/21/2018 0729 Last data filed at 10/21/2018 0601 Gross per 24 hour  Intake 735.85 ml  Output 110 ml  Net 625.85 ml   Weight change: 0 kg  Physical Exam: General: AAOx3 NAD CV: Heart RRR  Lungs: L/S CTA bilaterally Abd: abd SNT/ND with normal BS GU: Bladder non-palpable positive Foley(condom) Extremities: No LE edema.  Imaging: Vas [place] Upper Ext Vein Mapping (pre-op Avf)  Result Date: 10/19/2018 UPPER EXTREMITY VEIN MAPPING  Indications: Pre-access. Performing Technologist: June Leap RDMS, RVT  Examination Guidelines: A complete evaluation includes B-mode imaging, spectral Doppler, color Doppler, and power Doppler as needed of all accessible portions of each vessel. Bilateral testing is considered an integral part of a complete examination. Limited examinations for reoccurring indications may be performed as noted. +-----------------+-------------+----------+--------------+ Right Cephalic   Diameter (cm)Depth (cm)   Findings    +-----------------+-------------+----------+--------------+ Shoulder             0.14                              +-----------------+-------------+----------+--------------+ Mid upper arm        0.16                              +-----------------+-------------+----------+--------------+  Dist upper arm       0.11                              +-----------------+-------------+----------+--------------+ Antecubital fossa    0.16                              +-----------------+-------------+----------+--------------+ Prox forearm         0.12                              +-----------------+-------------+----------+--------------+ Mid forearm                             not visualized +-----------------+-------------+----------+--------------+  +-----------------+-------------+----------+--------------+ Right Basilic    Diameter (cm)Depth (cm)   Findings    +-----------------+-------------+----------+--------------+ Mid upper arm        0.20        1.15                  +-----------------+-------------+----------+--------------+ Dist upper arm       0.22        0.82                  +-----------------+-------------+----------+--------------+ Antecubital fossa    0.15        0.81     branching    +-----------------+-------------+----------+--------------+ Prox forearm                            not visualized +-----------------+-------------+----------+--------------+ +-----------------+-------------+----------+--------------+ Left Cephalic    Diameter (cm)Depth (cm)   Findings    +-----------------+-------------+----------+--------------+ Shoulder             0.15                              +-----------------+-------------+----------+--------------+ Mid upper arm        0.16                              +-----------------+-------------+----------+--------------+ Dist upper arm                             IV site     +-----------------+-------------+----------+--------------+ Antecubital fossa                       not visualized +-----------------+-------------+----------+--------------+ +-----------------+-------------+----------+---------+ Left Basilic     Diameter (cm)Depth (cm)Findings  +-----------------+-------------+----------+---------+ Mid upper arm        0.22        1.12             +-----------------+-------------+----------+---------+ Dist upper arm       0.21        0.74   branching +-----------------+-------------+----------+---------+ Antecubital fossa    0.21        0.84   branching +-----------------+-------------+----------+---------+ *See table(s) above for measurements and observations.  Diagnosing physician: Harold Barban MD Electronically signed by Harold Barban  MD on 10/19/2018 at 6:00:38 PM.    Final     Labs: BMET Recent Labs  Lab 10/14/18 0820 10/15/18 6144 10/17/18 0715 10/18/18 3154 10/19/18 0944 10/20/18 0749  NA 138 139  137 137 134* 137  K 3.7 4.1 4.5 3.6 5.5* 4.2  CL 109 108 108 103 102 107  CO2 21* 24 22 22 23 23   GLUCOSE 163* 109* 121* 138* 112* 122*  BUN 47* 45* 46* 43* 48* 49*  CREATININE 4.74* 4.86* 5.02* 5.19* 5.39* 5.48*  CALCIUM 8.5* 8.9 8.8* 8.5* 8.8* 8.5*  PHOS  --  3.0  --   --   --   --    CBC Recent Labs  Lab 10/15/18 0955 10/18/18 0713 10/19/18 0944 10/20/18 0749  WBC 8.5 8.4 8.4 6.9  HGB 11.8* 12.1* 12.8* 11.0*  HCT 35.5* 36.0* 37.6* 32.7*  MCV 96.2 96.8 94.0 96.7  PLT 342 356 PLATELET CLUMPS NOTED ON SMEAR, UNABLE TO ESTIMATE 332    Medications:    . amiodarone  400 mg Oral Daily  . amLODipine  10 mg Oral Daily  . aspirin EC  81 mg Oral Daily  . calcium carbonate  1 tablet Oral Once  . cloNIDine  0.1 mg Oral BID  . feeding supplement (ENSURE ENLIVE)  237 mL Oral BID BM  . heparin  5,000 Units Subcutaneous Q8H  . hydrALAZINE  100 mg Oral TID  . metoprolol tartrate  150 mg Oral BID  . pantoprazole  20 mg Oral Daily  . polyethylene glycol  17 g Oral Daily  . rosuvastatin  10 mg Oral q1800  . senna-docusate  1 tablet Oral QHS  . sodium bicarbonate  650 mg Oral BID      Otelia Santee, MD 10/21/2018, 7:29 AM

## 2018-10-21 NOTE — Progress Notes (Signed)
[facility]  Subjective  - Had some pain in right upper arm after graft placement yesterday, improving today.   Objective (!) 122/56 66 98.2 F (36.8 C) (Oral) 18 99%  Intake/Output Summary (Last 24 hours) at 10/21/2018 0746 Last data filed at 10/21/2018 0601 Gross per 24 hour  Intake 735.85 ml  Output 110 ml  Net 625.85 ml    Right upper arm graft good thrill palpable Palpable right radial pulse Incisions c/d/i - no hematoma  Laboratory Lab Results: Recent Labs    10/19/18 0944 10/20/18 0749  WBC 8.4 6.9  HGB 12.8* 11.0*  HCT 37.6* 32.7*  PLT PLATELET CLUMPS NOTED ON SMEAR, UNABLE TO ESTIMATE 332   BMET Recent Labs    10/19/18 0944 10/20/18 0749  NA 134* 137  K 5.5* 4.2  CL 102 107  CO2 23 23  GLUCOSE 112* 122*  BUN 48* 49*  CREATININE 5.39* 5.48*  CALCIUM 8.8* 8.5*    COAG Lab Results  Component Value Date   INR 1.0 03/30/2007   INR 0.9 03/21/2007   No results found for: PTT  Assessment/Planning: POD#1 s/p right upper arm graft placement.  No complaints.  Good thrill.  Hand motor and sensory intact.  Can access graft in 4 weeks.  Vascular will sign off.  Call if questions or concerns.   [name] 10/21/2018 7:46 AM --

## 2018-10-21 NOTE — Care Management Note (Addendum)
Case Management Note Manya Silvas, RN MSN CCM Transitions of Care 32M [place] [phone]  Patient Details  Name: SOHIL TIMKO MRN: [id number] Date of Birth: April 21, 1941  Subjective/Objective:             Benign hypertensive heart and kidney disease       Action/Plan: PTA home alone. Pt denied authorization for snf. HH PT/OT ordered. Choice offered to patient and daughter, [name]. [facility] for [facility] and DME. Referral made. RW ordered. Pt to stay with daughter, [name], at her home [facility] [place], [place] [address]). Pt was requesting new PCP, but now to f/u with family practice. Pt to call [employer] for preferred provided list in the event he changes his mind. Daughters to provide transportation. No identified medication needs. Pt to transition to daughter's home today.   Expected Discharge Date:                  Expected Discharge Plan:  Skilled Nursing Facility  In-House Referral:  Clinical Social Work  Discharge planning Services  CM Consult  Post Acute Care Choice:  Durable Medical Equipment Choice offered to:  Patient, Adult Children  DME Arranged:  [place] rolling DME Agency:  [place] Arranged:  PT, OT [place] Agency:  [place]  Status of Service:  Completed, signed off  If discussed at [place] of Stay Meetings, dates discussed:    Additional Comments:  Bartholomew Crews, RN 10/21/2018, 3:43 PM

## 2018-10-21 NOTE — Discharge Instructions (Signed)
Continue to give all medications as prescribed.  You will receive home health PT and OT.  Please go to your follow up appointment with Dr. Garlan Fillers.  Should you have questions or concerns, call his office at [phone].

## 2018-10-22 DIAGNOSIS — I132 Hypertensive heart and chronic kidney disease with heart failure and with stage 5 chronic kidney disease, or end stage renal disease: Secondary | ICD-10-CM | POA: Diagnosis not present

## 2018-10-22 DIAGNOSIS — I129 Hypertensive chronic kidney disease with stage 1 through stage 4 chronic kidney disease, or unspecified chronic kidney disease: Secondary | ICD-10-CM | POA: Diagnosis not present

## 2018-10-22 DIAGNOSIS — E1122 Type 2 diabetes mellitus with diabetic chronic kidney disease: Secondary | ICD-10-CM | POA: Diagnosis not present

## 2018-10-22 DIAGNOSIS — N185 Chronic kidney disease, stage 5: Secondary | ICD-10-CM | POA: Diagnosis not present

## 2018-10-22 DIAGNOSIS — I5033 Acute on chronic diastolic (congestive) heart failure: Secondary | ICD-10-CM | POA: Diagnosis not present

## 2018-10-22 DIAGNOSIS — E785 Hyperlipidemia, unspecified: Secondary | ICD-10-CM | POA: Diagnosis not present

## 2018-10-22 DIAGNOSIS — I2510 Atherosclerotic heart disease of native coronary artery without angina pectoris: Secondary | ICD-10-CM | POA: Diagnosis not present

## 2018-10-22 DIAGNOSIS — I517 Cardiomegaly: Secondary | ICD-10-CM | POA: Diagnosis not present

## 2018-10-22 DIAGNOSIS — Z9181 History of falling: Secondary | ICD-10-CM | POA: Diagnosis not present

## 2018-10-22 DIAGNOSIS — I472 Ventricular tachycardia: Secondary | ICD-10-CM | POA: Diagnosis not present

## 2018-10-22 DIAGNOSIS — Z950 Presence of cardiac pacemaker: Secondary | ICD-10-CM | POA: Diagnosis not present

## 2018-10-22 DIAGNOSIS — N049 Nephrotic syndrome with unspecified morphologic changes: Secondary | ICD-10-CM | POA: Diagnosis not present

## 2018-10-22 DIAGNOSIS — N183 Chronic kidney disease, stage 3 (moderate): Secondary | ICD-10-CM | POA: Diagnosis not present

## 2018-10-24 ENCOUNTER — Telehealth: Payer: Self-pay | Admitting: Family Medicine

## 2018-10-24 ENCOUNTER — Encounter (HOSPITAL_COMMUNITY): Payer: Self-pay | Admitting: Vascular Surgery

## 2018-10-24 DIAGNOSIS — Z9181 History of falling: Secondary | ICD-10-CM | POA: Diagnosis not present

## 2018-10-24 DIAGNOSIS — N183 Chronic kidney disease, stage 3 (moderate): Secondary | ICD-10-CM | POA: Diagnosis not present

## 2018-10-24 DIAGNOSIS — Z950 Presence of cardiac pacemaker: Secondary | ICD-10-CM | POA: Diagnosis not present

## 2018-10-24 DIAGNOSIS — E785 Hyperlipidemia, unspecified: Secondary | ICD-10-CM | POA: Diagnosis not present

## 2018-10-24 DIAGNOSIS — N049 Nephrotic syndrome with unspecified morphologic changes: Secondary | ICD-10-CM | POA: Diagnosis not present

## 2018-10-24 DIAGNOSIS — I472 Ventricular tachycardia: Secondary | ICD-10-CM | POA: Diagnosis not present

## 2018-10-24 DIAGNOSIS — I517 Cardiomegaly: Secondary | ICD-10-CM | POA: Diagnosis not present

## 2018-10-24 DIAGNOSIS — I2510 Atherosclerotic heart disease of native coronary artery without angina pectoris: Secondary | ICD-10-CM | POA: Diagnosis not present

## 2018-10-24 DIAGNOSIS — E1122 Type 2 diabetes mellitus with diabetic chronic kidney disease: Secondary | ICD-10-CM | POA: Diagnosis not present

## 2018-10-24 DIAGNOSIS — I129 Hypertensive chronic kidney disease with stage 1 through stage 4 chronic kidney disease, or unspecified chronic kidney disease: Secondary | ICD-10-CM | POA: Diagnosis not present

## 2018-10-24 NOTE — Telephone Encounter (Signed)
William [name] from [facility] is calling requesting home health and occupational therapy.  2xweeks for 4 weeks.  The best call back number is [phone].  Wendelyn Breslow also wants to let Dr. Garlan Fillers know that pt is complaining of constipation and would like to know if he can have something to help.

## 2018-10-24 NOTE — Telephone Encounter (Signed)
**Note De-Identified  Obfuscation** Patient's daughter, William [name] (the pt did give permission over the phone for me to s/w his daughter concerning his care) contacted regarding discharge from [facility] on 10/21/2018.  Patient understands to follow up with provider [name], NP on 10/31/2018 at 10:00 at [facility] in [place].. Patient understands discharge instructions? Yes Patient understands medications and regiment? Yes Patient understands to bring all medications to this visit? Yes

## 2018-10-26 DIAGNOSIS — E1122 Type 2 diabetes mellitus with diabetic chronic kidney disease: Secondary | ICD-10-CM | POA: Diagnosis not present

## 2018-10-26 DIAGNOSIS — E785 Hyperlipidemia, unspecified: Secondary | ICD-10-CM | POA: Diagnosis not present

## 2018-10-26 DIAGNOSIS — N049 Nephrotic syndrome with unspecified morphologic changes: Secondary | ICD-10-CM | POA: Diagnosis not present

## 2018-10-26 DIAGNOSIS — Z950 Presence of cardiac pacemaker: Secondary | ICD-10-CM | POA: Diagnosis not present

## 2018-10-26 DIAGNOSIS — I2510 Atherosclerotic heart disease of native coronary artery without angina pectoris: Secondary | ICD-10-CM | POA: Diagnosis not present

## 2018-10-26 DIAGNOSIS — I129 Hypertensive chronic kidney disease with stage 1 through stage 4 chronic kidney disease, or unspecified chronic kidney disease: Secondary | ICD-10-CM | POA: Diagnosis not present

## 2018-10-26 DIAGNOSIS — I517 Cardiomegaly: Secondary | ICD-10-CM | POA: Diagnosis not present

## 2018-10-26 DIAGNOSIS — N183 Chronic kidney disease, stage 3 (moderate): Secondary | ICD-10-CM | POA: Diagnosis not present

## 2018-10-26 DIAGNOSIS — Z9181 History of falling: Secondary | ICD-10-CM | POA: Diagnosis not present

## 2018-10-26 DIAGNOSIS — I472 Ventricular tachycardia: Secondary | ICD-10-CM | POA: Diagnosis not present

## 2018-10-31 ENCOUNTER — Ambulatory Visit: Payer: Medicare HMO | Admitting: Nurse Practitioner

## 2018-11-01 ENCOUNTER — Ambulatory Visit (INDEPENDENT_AMBULATORY_CARE_PROVIDER_SITE_OTHER): Payer: Medicare HMO | Admitting: Family Medicine

## 2018-11-01 ENCOUNTER — Encounter: Payer: Self-pay | Admitting: Family Medicine

## 2018-11-01 ENCOUNTER — Other Ambulatory Visit: Payer: Self-pay

## 2018-11-01 VITALS — BP 110/68 | HR 62 | Temp 97.9°F | Ht 67.0 in | Wt 176.2 lb

## 2018-11-01 DIAGNOSIS — I1311 Hypertensive heart and chronic kidney disease without heart failure, with stage 5 chronic kidney disease, or end stage renal disease: Secondary | ICD-10-CM | POA: Diagnosis not present

## 2018-11-01 DIAGNOSIS — N185 Chronic kidney disease, stage 5: Secondary | ICD-10-CM | POA: Diagnosis not present

## 2018-11-01 DIAGNOSIS — I10 Essential (primary) hypertension: Secondary | ICD-10-CM

## 2018-11-01 DIAGNOSIS — R69 Illness, unspecified: Secondary | ICD-10-CM | POA: Diagnosis not present

## 2018-11-01 DIAGNOSIS — F0390 Unspecified dementia without behavioral disturbance: Secondary | ICD-10-CM | POA: Diagnosis not present

## 2018-11-01 NOTE — Patient Instructions (Addendum)
It was great to see you again today! Thank you for letting me participate in your care!  Today, we discussed your kidney disease and your high blood pressure. Currently, your blood pressure is well controlled and I made no medications changes to that today.   Please follow up with your Cardiologist and Nephrologist (heart and kidney doctors) to continue management of your chronic kidney disease.  I will not start you on any medications for your diabetes and we will attempt to control it with diet and exercise.  Be well, Harolyn Rutherford, DO PGY-2, [facility]

## 2018-11-01 NOTE — Progress Notes (Signed)
Subjective: Chief Complaint  Patient presents with  . Hospitalization Follow-up     HPI: William [name] is a 78 y.o. presenting to clinic today to discuss the following:  Hospital Follow Up Patient is in clinic today due to a recent hospitalization. He came to our clinic once before after an ED visit last year due to HTN emergency and he chose to come to our clinic and establish care. At that visit patient indicated that he intended to go back to [facility] and reestablish his care there. He was not seen in our clinic again until today. He was just recently admitted to the hospital again for uncontrolled HTN and while in the hospital he developed confusion and had difficulty staying oriented to time and place. A MOCA was performed and he scored an 11/30. On speaking with his daughter (who is here today) he has been showing signs of progressive dementia before his hospitalization for about one year.   He was also found to have progressively worsening kidney function and diagnosed with CKD V and he was seen and evaluated by Nephrology who decided to proceed with creating a fistula in the arm as he is headed toward dialysis. He currently is not sure if he wants to ultimately get dialysis. He has outpatient follow up with Nephrology. He was also found to have cystic lesion on his left kidney and declines further workup at this time.   He was also found to have several runs of ventricular tachycardia and was seen by Cardiology and was started on amiodarone. His V-tach has resolved he has no palpitations today and he states he feels fine.  He does not remember much of his hospital stay and does not remember specific conversations that we personally had while he was in the hospital. He states he does remember but then will not be able to say what occurred. He claims no one explained anything to him and then he will state that he understands why he was in the hospital. He disagrees that he has  dementia. He is unwilling to repeat the [facility] today. He is currently staying with his daughter at home and she is providing him with 24hr care. He was seen by paliative care while in the hospital and he and daughter did not seem interested in having those conversations at this time and he wants to be full code.  ROS noted in HPI.   Past Medical, Surgical, Social, and Family History Reviewed & Updated per EMR.   Pertinent Historical Findings include:   Social History   Tobacco Use  Smoking Status Never Smoker  Smokeless Tobacco Never Used    Objective: BP 110/68   Pulse 62   Temp 97.9 F (36.6 C) (Oral)   Ht 5\' 7"  (1.702 m)   Wt 176 lb 3.2 oz (79.9 kg)   SpO2 99%   BMI 27.60 kg/m  Vitals and nursing notes reviewed  Physical Exam Gen: Alert and oriented to person and place, NAD HEENT: Normocephalic, atraumatic CV: RRR, no murmurs, normal S1, S2 split Resp: CTAB, no wheezing, rales, or rhonchi, comfortable work of breathing Abd: non-distended, non-tender, soft, +bs in all four quadrants Ext: no clubbing, cyanosis, or edema Skin: warm, dry, intact, no rashes  No results found for this or any previous visit (from the past 72 hour(s)).  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 Unclear if patient wants dialysis and given his dementia he would be a poor candidate but this is a complicated issue as he is somewhat lucid enough to make medical decisions. Daughter is very involved and I did discuss he needs to  make his prior wishes known and get the paperwork done for advance directives to help his quality of care. - Keep follow up with Nephrology, appreciate their recs   Dementia without behavioral disturbance ([place]) [place] in hospital 11/30 and patient declines to have repeat testing and denies he has dementia. His memory and his orientation waxes and wanes and this has been going on for several years now. His daughter is staying home with him for 24 hour care and he does have home health to assist her. They decline further conversation with palliative care at this time. I will continue to follow.   PATIENT EDUCATION PROVIDED: See AVS    Diagnosis and plan along with any newly prescribed medication(s) were discussed in detail with this patient today. The patient verbalized understanding and agreed with the plan. Patient advised if symptoms worsen return to clinic or ER.   No orders of the defined types were placed in this encounter.   No orders of the defined types were placed in this encounter.    Harolyn Rutherford, DO 11/01/2018, 2:34 PM PGY-2 [place]

## 2018-11-02 ENCOUNTER — Ambulatory Visit: Payer: Medicare HMO | Admitting: Nurse Practitioner

## 2018-11-02 ENCOUNTER — Encounter: Payer: Self-pay | Admitting: Nurse Practitioner

## 2018-11-02 ENCOUNTER — Other Ambulatory Visit: Payer: Self-pay | Admitting: *Deleted

## 2018-11-02 ENCOUNTER — Telehealth: Payer: Self-pay | Admitting: *Deleted

## 2018-11-02 VITALS — BP 170/68 | HR 67 | Ht 67.0 in | Wt 180.0 lb

## 2018-11-02 DIAGNOSIS — I472 Ventricular tachycardia: Secondary | ICD-10-CM

## 2018-11-02 DIAGNOSIS — Z950 Presence of cardiac pacemaker: Secondary | ICD-10-CM | POA: Diagnosis not present

## 2018-11-02 DIAGNOSIS — I517 Cardiomegaly: Secondary | ICD-10-CM | POA: Diagnosis not present

## 2018-11-02 DIAGNOSIS — Z79899 Other long term (current) drug therapy: Secondary | ICD-10-CM | POA: Diagnosis not present

## 2018-11-02 DIAGNOSIS — I4729 Other ventricular tachycardia: Secondary | ICD-10-CM

## 2018-11-02 DIAGNOSIS — E1122 Type 2 diabetes mellitus with diabetic chronic kidney disease: Secondary | ICD-10-CM | POA: Diagnosis not present

## 2018-11-02 DIAGNOSIS — E785 Hyperlipidemia, unspecified: Secondary | ICD-10-CM | POA: Diagnosis not present

## 2018-11-02 DIAGNOSIS — I129 Hypertensive chronic kidney disease with stage 1 through stage 4 chronic kidney disease, or unspecified chronic kidney disease: Secondary | ICD-10-CM | POA: Diagnosis not present

## 2018-11-02 DIAGNOSIS — Z9181 History of falling: Secondary | ICD-10-CM | POA: Diagnosis not present

## 2018-11-02 DIAGNOSIS — N049 Nephrotic syndrome with unspecified morphologic changes: Secondary | ICD-10-CM | POA: Diagnosis not present

## 2018-11-02 DIAGNOSIS — N183 Chronic kidney disease, stage 3 (moderate): Secondary | ICD-10-CM | POA: Diagnosis not present

## 2018-11-02 DIAGNOSIS — I2510 Atherosclerotic heart disease of native coronary artery without angina pectoris: Secondary | ICD-10-CM | POA: Diagnosis not present

## 2018-11-02 MED ORDER — PANTOPRAZOLE SODIUM 20 MG PO TBEC: 20.0000 mg | DELAYED_RELEASE_TABLET | Freq: Every day | ORAL | 3 refills | Status: DC

## 2018-11-02 MED ORDER — HYDRALAZINE HCL 100 MG PO TABS: 100.0000 mg | ORAL_TABLET | Freq: Three times a day (TID) | ORAL | 3 refills | Status: DC

## 2018-11-02 MED ORDER — AMLODIPINE BESYLATE 10 MG PO TABS: 10.0000 mg | ORAL_TABLET | Freq: Every day | ORAL | 3 refills | Status: DC

## 2018-11-02 MED ORDER — ROSUVASTATIN CALCIUM 10 MG PO TABS: 10.0000 mg | ORAL_TABLET | Freq: Every day | ORAL | 0 refills | Status: DC

## 2018-11-02 MED ORDER — CLONIDINE HCL 0.1 MG PO TABS: 0.1000 mg | ORAL_TABLET | Freq: Two times a day (BID) | ORAL | 3 refills | Status: DC

## 2018-11-02 MED ORDER — METOPROLOL TARTRATE 50 MG PO TABS: 150.0000 mg | ORAL_TABLET | Freq: Two times a day (BID) | ORAL | 3 refills | Status: DC

## 2018-11-02 MED ORDER — AMIODARONE HCL 200 MG PO TABS: 200.0000 mg | ORAL_TABLET | Freq: Every day | ORAL | 3 refills | Status: DC

## 2018-11-02 NOTE — Patient Instructions (Addendum)
We will be checking the following labs today - BMET, CBC, HPF and TSH   Medication Instructions:    Continue with your current medicines. BUT  I am cutting amiodarone back to 200 mg a day - this is at the drug store  I have refilled your medicines today   If you need a refill on your cardiac medications before your next appointment, please cll your pharmacy.     Testing/Procedures To Be Arranged:  N/A  Follow-Up:   See [name] in 3 to 4 months  See Dr. Rayann Heman as planned for your device check    At [facility], you and your health needs are our priority.  As part of our continuing mission to provide you with exceptional heart care, we have created designated Provider Care Teams.  These Care Teams include your primary Cardiologist (physician) and Advanced Practice Providers (APPs -  Physician Assistants and Nurse Practitioners) who all work together to provide you with the care you need, when you need it.  Special Instructions:  . Try to get a BP cuff at home and keep a record - bring diary and your cuff with you to next visit so we can check.   Call the [place] office at [phone] if you have any questions, problems or concerns.
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
## 2018-11-02 NOTE — Telephone Encounter (Signed)
S/w pt's daughter per (DPR), pt was seen by [name], NP today and was supposed to get labs.  Pt was stuck 3 times could not get blood.  Pt does have upcoming appt with Dr.Allred on 2/3 and will get labs that day.  Orders placed and linked, appt made, comments in appt notes. Pt was advised to stay hydrated the day of appt.

## 2018-11-03 DIAGNOSIS — N183 Chronic kidney disease, stage 3 (moderate): Secondary | ICD-10-CM | POA: Diagnosis not present

## 2018-11-03 DIAGNOSIS — Z950 Presence of cardiac pacemaker: Secondary | ICD-10-CM | POA: Diagnosis not present

## 2018-11-03 DIAGNOSIS — Z9181 History of falling: Secondary | ICD-10-CM | POA: Diagnosis not present

## 2018-11-03 DIAGNOSIS — I472 Ventricular tachycardia: Secondary | ICD-10-CM | POA: Diagnosis not present

## 2018-11-03 DIAGNOSIS — E1122 Type 2 diabetes mellitus with diabetic chronic kidney disease: Secondary | ICD-10-CM | POA: Diagnosis not present

## 2018-11-03 DIAGNOSIS — E785 Hyperlipidemia, unspecified: Secondary | ICD-10-CM | POA: Diagnosis not present

## 2018-11-03 DIAGNOSIS — I129 Hypertensive chronic kidney disease with stage 1 through stage 4 chronic kidney disease, or unspecified chronic kidney disease: Secondary | ICD-10-CM | POA: Diagnosis not present

## 2018-11-03 DIAGNOSIS — N049 Nephrotic syndrome with unspecified morphologic changes: Secondary | ICD-10-CM | POA: Diagnosis not present

## 2018-11-03 DIAGNOSIS — I2510 Atherosclerotic heart disease of native coronary artery without angina pectoris: Secondary | ICD-10-CM | POA: Diagnosis not present

## 2018-11-03 DIAGNOSIS — I517 Cardiomegaly: Secondary | ICD-10-CM | POA: Diagnosis not present

## 2018-11-04 DIAGNOSIS — N049 Nephrotic syndrome with unspecified morphologic changes: Secondary | ICD-10-CM | POA: Diagnosis not present

## 2018-11-04 DIAGNOSIS — E1122 Type 2 diabetes mellitus with diabetic chronic kidney disease: Secondary | ICD-10-CM | POA: Diagnosis not present

## 2018-11-04 DIAGNOSIS — E785 Hyperlipidemia, unspecified: Secondary | ICD-10-CM | POA: Diagnosis not present

## 2018-11-04 DIAGNOSIS — I129 Hypertensive chronic kidney disease with stage 1 through stage 4 chronic kidney disease, or unspecified chronic kidney disease: Secondary | ICD-10-CM | POA: Diagnosis not present

## 2018-11-04 DIAGNOSIS — Z950 Presence of cardiac pacemaker: Secondary | ICD-10-CM | POA: Diagnosis not present

## 2018-11-04 DIAGNOSIS — I2510 Atherosclerotic heart disease of native coronary artery without angina pectoris: Secondary | ICD-10-CM | POA: Diagnosis not present

## 2018-11-04 DIAGNOSIS — Z9181 History of falling: Secondary | ICD-10-CM | POA: Diagnosis not present

## 2018-11-04 DIAGNOSIS — I472 Ventricular tachycardia: Secondary | ICD-10-CM | POA: Diagnosis not present

## 2018-11-04 DIAGNOSIS — N183 Chronic kidney disease, stage 3 (moderate): Secondary | ICD-10-CM | POA: Diagnosis not present

## 2018-11-04 DIAGNOSIS — I517 Cardiomegaly: Secondary | ICD-10-CM | POA: Diagnosis not present

## 2018-11-04 NOTE — Assessment & Plan Note (Signed)
MOCA in hospital 11/30 and patient declines to have repeat testing and denies he has dementia. His memory and his orientation waxes and wanes and this has been going on for several years now. His daughter is staying home with him for 24 hour care and he does have home health to assist her. They decline further conversation with palliative care at this time. I will continue to follow.

## 2018-11-04 NOTE — Assessment & Plan Note (Signed)
Currently well controlled at today's visit on Metoprolol, Clonidine, Hydralazine, and Norvasc -  Cont current regimen  - He has follow up with Cardiology to manage his amiodarone and pacemaker - Discussed with daughter to check his BP regularly and keep record

## 2018-11-04 NOTE — Assessment & Plan Note (Signed)
Has follow up with Nephrology and procedure to create fistula in arm has been performed but per last Nephrology note he does not need urgent dialysis. Unclear if patient wants dialysis and given his dementia he would be a poor candidate but this is a complicated issue as he is somewhat lucid enough to make medical decisions. Daughter is very involved and I did discuss he needs to make his prior wishes known and get the paperwork done for advance directives to help his quality of care. - Keep follow up with Nephrology, appreciate their recs

## 2018-11-07 DIAGNOSIS — I517 Cardiomegaly: Secondary | ICD-10-CM | POA: Diagnosis not present

## 2018-11-07 DIAGNOSIS — I129 Hypertensive chronic kidney disease with stage 1 through stage 4 chronic kidney disease, or unspecified chronic kidney disease: Secondary | ICD-10-CM | POA: Diagnosis not present

## 2018-11-07 DIAGNOSIS — N183 Chronic kidney disease, stage 3 (moderate): Secondary | ICD-10-CM | POA: Diagnosis not present

## 2018-11-07 DIAGNOSIS — N049 Nephrotic syndrome with unspecified morphologic changes: Secondary | ICD-10-CM | POA: Diagnosis not present

## 2018-11-07 DIAGNOSIS — E785 Hyperlipidemia, unspecified: Secondary | ICD-10-CM | POA: Diagnosis not present

## 2018-11-07 DIAGNOSIS — E1122 Type 2 diabetes mellitus with diabetic chronic kidney disease: Secondary | ICD-10-CM | POA: Diagnosis not present

## 2018-11-07 DIAGNOSIS — I472 Ventricular tachycardia: Secondary | ICD-10-CM | POA: Diagnosis not present

## 2018-11-07 DIAGNOSIS — I2510 Atherosclerotic heart disease of native coronary artery without angina pectoris: Secondary | ICD-10-CM | POA: Diagnosis not present

## 2018-11-07 DIAGNOSIS — Z9181 History of falling: Secondary | ICD-10-CM | POA: Diagnosis not present

## 2018-11-07 DIAGNOSIS — Z950 Presence of cardiac pacemaker: Secondary | ICD-10-CM | POA: Diagnosis not present

## 2018-11-08 ENCOUNTER — Other Ambulatory Visit: Payer: Self-pay

## 2018-11-08 ENCOUNTER — Telehealth: Payer: Self-pay | Admitting: Family Medicine

## 2018-11-08 ENCOUNTER — Emergency Department (HOSPITAL_COMMUNITY): Payer: Medicare HMO

## 2018-11-08 ENCOUNTER — Encounter (HOSPITAL_COMMUNITY): Payer: Self-pay | Admitting: Emergency Medicine

## 2018-11-08 ENCOUNTER — Inpatient Hospital Stay (HOSPITAL_COMMUNITY)
Admission: EM | Admit: 2018-11-08 | Discharge: 2018-11-15 | DRG: 291 | Disposition: A | Discharge status: Skilled Nursing Facility | Payer: Medicare HMO | Attending: Family Medicine | Admitting: Family Medicine

## 2018-11-08 DIAGNOSIS — I10 Essential (primary) hypertension: Secondary | ICD-10-CM | POA: Diagnosis not present

## 2018-11-08 DIAGNOSIS — K5750 Diverticulosis of both small and large intestine without perforation or abscess without bleeding: Secondary | ICD-10-CM | POA: Diagnosis present

## 2018-11-08 DIAGNOSIS — J810 Acute pulmonary edema: Secondary | ICD-10-CM | POA: Diagnosis not present

## 2018-11-08 DIAGNOSIS — R402362 Coma scale, best motor response, obeys commands, at arrival to emergency department: Secondary | ICD-10-CM | POA: Diagnosis present

## 2018-11-08 DIAGNOSIS — N250 Renal osteodystrophy: Secondary | ICD-10-CM | POA: Diagnosis present

## 2018-11-08 DIAGNOSIS — N184 Chronic kidney disease, stage 4 (severe): Secondary | ICD-10-CM

## 2018-11-08 DIAGNOSIS — I132 Hypertensive heart and chronic kidney disease with heart failure and with stage 5 chronic kidney disease, or end stage renal disease: Principal | ICD-10-CM | POA: Diagnosis present

## 2018-11-08 DIAGNOSIS — R5383 Other fatigue: Secondary | ICD-10-CM | POA: Diagnosis not present

## 2018-11-08 DIAGNOSIS — I160 Hypertensive urgency: Secondary | ICD-10-CM | POA: Diagnosis present

## 2018-11-08 DIAGNOSIS — I2510 Atherosclerotic heart disease of native coronary artery without angina pectoris: Secondary | ICD-10-CM | POA: Diagnosis present

## 2018-11-08 DIAGNOSIS — E859 Amyloidosis, unspecified: Secondary | ICD-10-CM | POA: Diagnosis present

## 2018-11-08 DIAGNOSIS — Z66 Do not resuscitate: Secondary | ICD-10-CM | POA: Diagnosis present

## 2018-11-08 DIAGNOSIS — R402132 Coma scale, eyes open, to sound, at arrival to emergency department: Secondary | ICD-10-CM | POA: Diagnosis present

## 2018-11-08 DIAGNOSIS — N185 Chronic kidney disease, stage 5: Secondary | ICD-10-CM | POA: Diagnosis present

## 2018-11-08 DIAGNOSIS — N200 Calculus of kidney: Secondary | ICD-10-CM | POA: Diagnosis present

## 2018-11-08 DIAGNOSIS — F0390 Unspecified dementia without behavioral disturbance: Secondary | ICD-10-CM | POA: Diagnosis present

## 2018-11-08 DIAGNOSIS — I5033 Acute on chronic diastolic (congestive) heart failure: Secondary | ICD-10-CM | POA: Diagnosis present

## 2018-11-08 DIAGNOSIS — Z8679 Personal history of other diseases of the circulatory system: Secondary | ICD-10-CM

## 2018-11-08 DIAGNOSIS — K5900 Constipation, unspecified: Secondary | ICD-10-CM | POA: Diagnosis present

## 2018-11-08 DIAGNOSIS — E1122 Type 2 diabetes mellitus with diabetic chronic kidney disease: Secondary | ICD-10-CM | POA: Diagnosis present

## 2018-11-08 DIAGNOSIS — K449 Diaphragmatic hernia without obstruction or gangrene: Secondary | ICD-10-CM | POA: Diagnosis present

## 2018-11-08 DIAGNOSIS — R918 Other nonspecific abnormal finding of lung field: Secondary | ICD-10-CM | POA: Diagnosis not present

## 2018-11-08 DIAGNOSIS — R402252 Coma scale, best verbal response, oriented, at arrival to emergency department: Secondary | ICD-10-CM | POA: Diagnosis present

## 2018-11-08 DIAGNOSIS — Z8709 Personal history of other diseases of the respiratory system: Secondary | ICD-10-CM

## 2018-11-08 DIAGNOSIS — Z79899 Other long term (current) drug therapy: Secondary | ICD-10-CM

## 2018-11-08 DIAGNOSIS — Z7982 Long term (current) use of aspirin: Secondary | ICD-10-CM

## 2018-11-08 DIAGNOSIS — G4700 Insomnia, unspecified: Secondary | ICD-10-CM | POA: Diagnosis present

## 2018-11-08 DIAGNOSIS — R0902 Hypoxemia: Secondary | ICD-10-CM | POA: Diagnosis present

## 2018-11-08 DIAGNOSIS — I5043 Acute on chronic combined systolic (congestive) and diastolic (congestive) heart failure: Secondary | ICD-10-CM

## 2018-11-08 DIAGNOSIS — Z6826 Body mass index (BMI) 26.0-26.9, adult: Secondary | ICD-10-CM

## 2018-11-08 DIAGNOSIS — Z888 Allergy status to other drugs, medicaments and biological substances status: Secondary | ICD-10-CM

## 2018-11-08 DIAGNOSIS — I442 Atrioventricular block, complete: Secondary | ICD-10-CM | POA: Diagnosis present

## 2018-11-08 DIAGNOSIS — K4090 Unilateral inguinal hernia, without obstruction or gangrene, not specified as recurrent: Secondary | ICD-10-CM | POA: Diagnosis present

## 2018-11-08 DIAGNOSIS — R630 Anorexia: Secondary | ICD-10-CM | POA: Diagnosis present

## 2018-11-08 DIAGNOSIS — R109 Unspecified abdominal pain: Secondary | ICD-10-CM

## 2018-11-08 DIAGNOSIS — N281 Cyst of kidney, acquired: Secondary | ICD-10-CM

## 2018-11-08 DIAGNOSIS — E785 Hyperlipidemia, unspecified: Secondary | ICD-10-CM | POA: Diagnosis present

## 2018-11-08 DIAGNOSIS — D631 Anemia in chronic kidney disease: Secondary | ICD-10-CM | POA: Diagnosis present

## 2018-11-08 DIAGNOSIS — Z9861 Coronary angioplasty status: Secondary | ICD-10-CM

## 2018-11-08 DIAGNOSIS — I700 Atherosclerosis of aorta: Secondary | ICD-10-CM | POA: Diagnosis present

## 2018-11-08 LAB — CBC
HCT: 35.9 % — ABNORMAL LOW (ref 39.0–52.0)
Hemoglobin: 11.7 g/dL — ABNORMAL LOW (ref 13.0–17.0)
MCH: 32.3 pg (ref 26.0–34.0)
MCHC: 32.6 g/dL (ref 30.0–36.0)
MCV: 99.2 fL (ref 80.0–100.0)
Platelets: 260 10*3/uL (ref 150–400)
RBC: 3.62 MIL/uL — ABNORMAL LOW (ref 4.22–5.81)
RDW: 12.9 % (ref 11.5–15.5)
WBC: 14.3 10*3/uL — ABNORMAL HIGH (ref 4.0–10.5)
nRBC: 0 % (ref 0.0–0.2)

## 2018-11-08 LAB — I-STAT TROPONIN, ED: Troponin i, poc: 0 ng/mL (ref 0.00–0.08)

## 2018-11-08 LAB — BASIC METABOLIC PANEL
Anion gap: 12 (ref 5–15)
BUN: 35 mg/dL — ABNORMAL HIGH (ref 8–23)
CO2: 19 mmol/L — ABNORMAL LOW (ref 22–32)
Calcium: 9.2 mg/dL (ref 8.9–10.3)
Chloride: 105 mmol/L (ref 98–111)
Creatinine, Ser: 4.96 mg/dL — ABNORMAL HIGH (ref 0.61–1.24)
GFR calc Af Amer: 12 mL/min — ABNORMAL LOW (ref >60)
GFR calc non Af Amer: 10 mL/min — ABNORMAL LOW (ref >60)
Glucose, Bld: 170 mg/dL — ABNORMAL HIGH (ref 70–99)
Potassium: 4.5 mmol/L (ref 3.5–5.1)
Sodium: 136 mmol/L (ref 135–145)

## 2018-11-08 LAB — BRAIN NATRIURETIC PEPTIDE: B Natriuretic Peptide: 1146.2 pg/mL — ABNORMAL HIGH (ref 0.0–100.0)

## 2018-11-08 MED ORDER — ONDANSETRON HCL 4 MG/2ML IJ SOLN
4.0000 mg | Freq: Once | INTRAMUSCULAR | Status: AC
  Administered 2018-11-08: 4 mg via INTRAVENOUS
  Filled 2018-11-08: qty 2

## 2018-11-08 MED ORDER — SODIUM CHLORIDE 0.9% FLUSH: 3.0000 mL | Freq: Once | INTRAVENOUS | Status: DC

## 2018-11-08 MED ORDER — FUROSEMIDE 10 MG/ML IJ SOLN
40.0000 mg | Freq: Once | INTRAMUSCULAR | Status: AC
  Administered 2018-11-08: 40 mg via INTRAVENOUS
  Filled 2018-11-08: qty 4

## 2018-11-08 NOTE — ED Triage Notes (Signed)
Patient c/o feeling generally weak today; speaks very softly. Patient has a fistula in right arm and was sent by phys for worsening kidney failure.

## 2018-11-08 NOTE — Telephone Encounter (Signed)
**  After Hours/ Emergency Line Call**  Received a call to report that William [name] had sudden onset abdominal pain and body aches. Started off tonight with stomach hurting and nausea. Had bowel movement and then having abdominal pain around umbilicus. No blood in stool. Body aches that are severe rates 10/10. Feeling very fatigued. Poor po appetitive. Has been vomiting NBNB, now dry heaving. Having difficulty keeping liquids down. Have subjective fevers.  Recommended that be evaluated in ED tonight. Daughter voiced good understanding. Will forward to PCP.  Bufford Lope, DO PGY-3, [facility] 11/08/2018 9:18 PM

## 2018-11-09 ENCOUNTER — Telehealth: Payer: Self-pay | Admitting: *Deleted

## 2018-11-09 ENCOUNTER — Emergency Department (HOSPITAL_COMMUNITY): Payer: Medicare HMO

## 2018-11-09 ENCOUNTER — Inpatient Hospital Stay (HOSPITAL_COMMUNITY): Payer: Medicare HMO

## 2018-11-09 ENCOUNTER — Encounter (HOSPITAL_COMMUNITY): Payer: Self-pay | Admitting: Emergency Medicine

## 2018-11-09 DIAGNOSIS — R7989 Other specified abnormal findings of blood chemistry: Secondary | ICD-10-CM | POA: Diagnosis not present

## 2018-11-09 DIAGNOSIS — I5033 Acute on chronic diastolic (congestive) heart failure: Secondary | ICD-10-CM

## 2018-11-09 DIAGNOSIS — R2689 Other abnormalities of gait and mobility: Secondary | ICD-10-CM | POA: Diagnosis not present

## 2018-11-09 DIAGNOSIS — N250 Renal osteodystrophy: Secondary | ICD-10-CM | POA: Diagnosis not present

## 2018-11-09 DIAGNOSIS — I160 Hypertensive urgency: Secondary | ICD-10-CM

## 2018-11-09 DIAGNOSIS — R109 Unspecified abdominal pain: Secondary | ICD-10-CM

## 2018-11-09 DIAGNOSIS — I442 Atrioventricular block, complete: Secondary | ICD-10-CM | POA: Diagnosis not present

## 2018-11-09 DIAGNOSIS — E785 Hyperlipidemia, unspecified: Secondary | ICD-10-CM | POA: Diagnosis not present

## 2018-11-09 DIAGNOSIS — Z7189 Other specified counseling: Secondary | ICD-10-CM | POA: Diagnosis not present

## 2018-11-09 DIAGNOSIS — I2510 Atherosclerotic heart disease of native coronary artery without angina pectoris: Secondary | ICD-10-CM | POA: Diagnosis present

## 2018-11-09 DIAGNOSIS — I129 Hypertensive chronic kidney disease with stage 1 through stage 4 chronic kidney disease, or unspecified chronic kidney disease: Secondary | ICD-10-CM | POA: Diagnosis not present

## 2018-11-09 DIAGNOSIS — R5383 Other fatigue: Secondary | ICD-10-CM | POA: Diagnosis present

## 2018-11-09 DIAGNOSIS — Z6826 Body mass index (BMI) 26.0-26.9, adult: Secondary | ICD-10-CM | POA: Diagnosis not present

## 2018-11-09 DIAGNOSIS — N281 Cyst of kidney, acquired: Secondary | ICD-10-CM | POA: Diagnosis not present

## 2018-11-09 DIAGNOSIS — J810 Acute pulmonary edema: Secondary | ICD-10-CM

## 2018-11-09 DIAGNOSIS — E859 Amyloidosis, unspecified: Secondary | ICD-10-CM | POA: Diagnosis not present

## 2018-11-09 DIAGNOSIS — D631 Anemia in chronic kidney disease: Secondary | ICD-10-CM | POA: Diagnosis present

## 2018-11-09 DIAGNOSIS — I700 Atherosclerosis of aorta: Secondary | ICD-10-CM | POA: Diagnosis present

## 2018-11-09 DIAGNOSIS — R402132 Coma scale, eyes open, to sound, at arrival to emergency department: Secondary | ICD-10-CM | POA: Diagnosis not present

## 2018-11-09 DIAGNOSIS — R630 Anorexia: Secondary | ICD-10-CM | POA: Diagnosis present

## 2018-11-09 DIAGNOSIS — I132 Hypertensive heart and chronic kidney disease with heart failure and with stage 5 chronic kidney disease, or end stage renal disease: Secondary | ICD-10-CM | POA: Diagnosis not present

## 2018-11-09 DIAGNOSIS — E1122 Type 2 diabetes mellitus with diabetic chronic kidney disease: Secondary | ICD-10-CM | POA: Diagnosis not present

## 2018-11-09 DIAGNOSIS — K449 Diaphragmatic hernia without obstruction or gangrene: Secondary | ICD-10-CM | POA: Diagnosis present

## 2018-11-09 DIAGNOSIS — K5750 Diverticulosis of both small and large intestine without perforation or abscess without bleeding: Secondary | ICD-10-CM | POA: Diagnosis present

## 2018-11-09 DIAGNOSIS — R278 Other lack of coordination: Secondary | ICD-10-CM | POA: Diagnosis not present

## 2018-11-09 DIAGNOSIS — N179 Acute kidney failure, unspecified: Secondary | ICD-10-CM | POA: Diagnosis not present

## 2018-11-09 DIAGNOSIS — N2889 Other specified disorders of kidney and ureter: Secondary | ICD-10-CM | POA: Diagnosis not present

## 2018-11-09 DIAGNOSIS — N184 Chronic kidney disease, stage 4 (severe): Secondary | ICD-10-CM | POA: Diagnosis not present

## 2018-11-09 DIAGNOSIS — R0902 Hypoxemia: Secondary | ICD-10-CM | POA: Diagnosis not present

## 2018-11-09 DIAGNOSIS — M2550 Pain in unspecified joint: Secondary | ICD-10-CM | POA: Diagnosis not present

## 2018-11-09 DIAGNOSIS — K4090 Unilateral inguinal hernia, without obstruction or gangrene, not specified as recurrent: Secondary | ICD-10-CM | POA: Diagnosis not present

## 2018-11-09 DIAGNOSIS — R402362 Coma scale, best motor response, obeys commands, at arrival to emergency department: Secondary | ICD-10-CM | POA: Diagnosis not present

## 2018-11-09 DIAGNOSIS — R41841 Cognitive communication deficit: Secondary | ICD-10-CM | POA: Diagnosis not present

## 2018-11-09 DIAGNOSIS — Z7401 Bed confinement status: Secondary | ICD-10-CM | POA: Diagnosis not present

## 2018-11-09 DIAGNOSIS — I120 Hypertensive chronic kidney disease with stage 5 chronic kidney disease or end stage renal disease: Secondary | ICD-10-CM | POA: Diagnosis not present

## 2018-11-09 DIAGNOSIS — M6281 Muscle weakness (generalized): Secondary | ICD-10-CM | POA: Diagnosis not present

## 2018-11-09 DIAGNOSIS — N200 Calculus of kidney: Secondary | ICD-10-CM | POA: Diagnosis present

## 2018-11-09 DIAGNOSIS — E119 Type 2 diabetes mellitus without complications: Secondary | ICD-10-CM | POA: Diagnosis not present

## 2018-11-09 DIAGNOSIS — R5381 Other malaise: Secondary | ICD-10-CM | POA: Diagnosis not present

## 2018-11-09 DIAGNOSIS — I5043 Acute on chronic combined systolic (congestive) and diastolic (congestive) heart failure: Secondary | ICD-10-CM

## 2018-11-09 DIAGNOSIS — Z515 Encounter for palliative care: Secondary | ICD-10-CM | POA: Diagnosis not present

## 2018-11-09 DIAGNOSIS — K5900 Constipation, unspecified: Secondary | ICD-10-CM | POA: Diagnosis present

## 2018-11-09 DIAGNOSIS — F0390 Unspecified dementia without behavioral disturbance: Secondary | ICD-10-CM | POA: Diagnosis present

## 2018-11-09 DIAGNOSIS — R402252 Coma scale, best verbal response, oriented, at arrival to emergency department: Secondary | ICD-10-CM | POA: Diagnosis not present

## 2018-11-09 DIAGNOSIS — N185 Chronic kidney disease, stage 5: Secondary | ICD-10-CM | POA: Diagnosis not present

## 2018-11-09 LAB — D-DIMER, QUANTITATIVE: D-Dimer, Quant: 5.76 ug/mL-FEU — ABNORMAL HIGH (ref 0.00–0.50)

## 2018-11-09 LAB — URINALYSIS, ROUTINE W REFLEX MICROSCOPIC
Bilirubin Urine: NEGATIVE
Glucose, UA: NEGATIVE mg/dL
Hgb urine dipstick: NEGATIVE
Ketones, ur: NEGATIVE mg/dL
Leukocytes, UA: NEGATIVE
Nitrite: NEGATIVE
Protein, ur: 100 mg/dL — AB
Specific Gravity, Urine: 1.009 (ref 1.005–1.030)
pH: 7 (ref 5.0–8.0)

## 2018-11-09 LAB — PHOSPHORUS: Phosphorus: 4.4 mg/dL (ref 2.5–4.6)

## 2018-11-09 MED ORDER — CLONIDINE HCL 0.1 MG PO TABS
0.1000 mg | ORAL_TABLET | Freq: Two times a day (BID) | ORAL | Status: DC
  Administered 2018-11-09 – 2018-11-15 (×13): 0.1 mg via ORAL
  Filled 2018-11-09 (×13): qty 1

## 2018-11-09 MED ORDER — SODIUM BICARBONATE 650 MG PO TABS
650.0000 mg | ORAL_TABLET | Freq: Two times a day (BID) | ORAL | Status: DC
  Administered 2018-11-09 – 2018-11-15 (×13): 650 mg via ORAL
  Filled 2018-11-09 (×13): qty 1

## 2018-11-09 MED ORDER — ACETAMINOPHEN 325 MG PO TABS
650.0000 mg | ORAL_TABLET | ORAL | Status: DC | PRN
  Administered 2018-11-13: 650 mg via ORAL
  Filled 2018-11-09: qty 2

## 2018-11-09 MED ORDER — POLYETHYLENE GLYCOL 3350 17 G PO PACK
17.0000 g | PACK | Freq: Two times a day (BID) | ORAL | Status: DC
  Administered 2018-11-09: 17 g via ORAL
  Filled 2018-11-09: qty 1

## 2018-11-09 MED ORDER — ALUM & MAG HYDROXIDE-SIMETH 200-200-20 MG/5ML PO SUSP: 30.0000 mL | Freq: Four times a day (QID) | ORAL | Status: DC | PRN

## 2018-11-09 MED ORDER — TECHNETIUM TO 99M ALBUMIN AGGREGATED
4.2000 | Freq: Once | INTRAVENOUS | Status: AC | PRN
  Administered 2018-11-09: 4.2 via INTRAVENOUS

## 2018-11-09 MED ORDER — HYDRALAZINE HCL 50 MG PO TABS
100.0000 mg | ORAL_TABLET | Freq: Three times a day (TID) | ORAL | Status: DC
  Administered 2018-11-09 – 2018-11-15 (×17): 100 mg via ORAL
  Filled 2018-11-09 (×19): qty 2

## 2018-11-09 MED ORDER — TECHNETIUM TC 99M DIETHYLENETRIAME-PENTAACETIC ACID
31.0000 | Freq: Once | INTRAVENOUS | Status: AC | PRN
  Administered 2018-11-09: 31 via INTRAVENOUS

## 2018-11-09 MED ORDER — SODIUM CHLORIDE 0.9 % IV SOLN: 250.0000 mL | INTRAVENOUS | Status: DC | PRN

## 2018-11-09 MED ORDER — ONDANSETRON HCL 4 MG/2ML IJ SOLN
4.0000 mg | Freq: Four times a day (QID) | INTRAMUSCULAR | Status: DC | PRN
  Administered 2018-11-09 – 2018-11-10 (×2): 4 mg via INTRAVENOUS
  Filled 2018-11-09 (×2): qty 2

## 2018-11-09 MED ORDER — AMIODARONE HCL 200 MG PO TABS
200.0000 mg | ORAL_TABLET | Freq: Every day | ORAL | Status: DC
  Administered 2018-11-09 – 2018-11-15 (×7): 200 mg via ORAL
  Filled 2018-11-09 (×7): qty 1

## 2018-11-09 MED ORDER — ROSUVASTATIN CALCIUM 5 MG PO TABS
10.0000 mg | ORAL_TABLET | Freq: Every day | ORAL | Status: DC
  Administered 2018-11-09 – 2018-11-14 (×6): 10 mg via ORAL
  Filled 2018-11-09 (×6): qty 2

## 2018-11-09 MED ORDER — SODIUM CHLORIDE 0.9% FLUSH: 3.0000 mL | INTRAVENOUS | Status: DC | PRN

## 2018-11-09 MED ORDER — POLYETHYLENE GLYCOL 3350 17 G PO PACK: 17.0000 g | PACK | Freq: Every day | ORAL | Status: DC | PRN

## 2018-11-09 MED ORDER — SODIUM CHLORIDE 0.9% FLUSH
3.0000 mL | Freq: Two times a day (BID) | INTRAVENOUS | Status: DC
  Administered 2018-11-09 – 2018-11-15 (×13): 3 mL via INTRAVENOUS

## 2018-11-09 MED ORDER — FUROSEMIDE 10 MG/ML IJ SOLN
80.0000 mg | Freq: Once | INTRAMUSCULAR | Status: AC
  Administered 2018-11-09: 80 mg via INTRAVENOUS
  Filled 2018-11-09: qty 8

## 2018-11-09 MED ORDER — ASPIRIN EC 81 MG PO TBEC
81.0000 mg | DELAYED_RELEASE_TABLET | Freq: Every day | ORAL | Status: DC
  Administered 2018-11-09 – 2018-11-15 (×7): 81 mg via ORAL
  Filled 2018-11-09 (×7): qty 1

## 2018-11-09 MED ORDER — METOPROLOL TARTRATE 50 MG PO TABS
150.0000 mg | ORAL_TABLET | Freq: Two times a day (BID) | ORAL | Status: DC
  Administered 2018-11-09 – 2018-11-15 (×12): 150 mg via ORAL
  Filled 2018-11-09 (×13): qty 3

## 2018-11-09 MED ORDER — PANTOPRAZOLE SODIUM 20 MG PO TBEC
20.0000 mg | DELAYED_RELEASE_TABLET | Freq: Every day | ORAL | Status: DC
  Administered 2018-11-09 – 2018-11-15 (×7): 20 mg via ORAL
  Filled 2018-11-09 (×7): qty 1

## 2018-11-09 MED ORDER — ENOXAPARIN SODIUM 30 MG/0.3ML ~~LOC~~ SOLN
30.0000 mg | SUBCUTANEOUS | Status: DC
  Administered 2018-11-09 – 2018-11-13 (×5): 30 mg via SUBCUTANEOUS
  Filled 2018-11-09 (×6): qty 0.3

## 2018-11-09 MED ORDER — AMLODIPINE BESYLATE 10 MG PO TABS
10.0000 mg | ORAL_TABLET | Freq: Every day | ORAL | Status: DC
  Administered 2018-11-09 – 2018-11-15 (×7): 10 mg via ORAL
  Filled 2018-11-09 (×7): qty 1
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
## 2018-11-09 NOTE — Progress Notes (Signed)
Physical Therapy Evaluation Patient Details Name: William [name] MRN: [id number] DOB: 1941-01-08 Today's Date: 11/09/2018   History of Present Illness  78 y.o. male presenting with admission this AM early for hypoxia, pleural effusion, pulm edema, CHF and abd pain.  Has relieved pain but the pt is on 4L O2 with no previous use of O2.  PMHx:  altered mental status, AKI, hypertensive urgency, CKD, T2DM, HTN, CAD s/p PCI, HLD, complete heart block with pacemaker, SDH s/p craniotomy 2016 ([facility]).  Clinical Impression  Pt was seen for mobility using O2 at 4L with baseline sat 100% and after 100' walk was 98%.  Did not wean O2 as pt has just arrived from ED, but per nursing should try to decrease tomorrow to attempt stairs.  Pt is motivated to do so and will agree to this as he was not on O2 at home.  He is not currently safe or able towalk with RW and to manage O2 alone, so will need to see all this effort with tank on stairs if required for home to change his plan from SNF.    Follow Up Recommendations SNF;Supervision for mobility/OOB    Equipment Recommendations  None recommended by PT    Recommendations for Other Services       Precautions / Restrictions Precautions Precautions: Fall Precaution Comments: O2 needed 4L Restrictions Weight Bearing Restrictions: No      Mobility  Bed Mobility Overal bed mobility: Needs Assistance Bed Mobility: Supine to Sit;Sit to Supine Rolling: Min guard Sidelying to sit: Min assist Supine to sit: Min assist Sit to supine: Min assist      Transfers Overall transfer level: Needs assistance Equipment used: Rolling walker (2 wheeled) Transfers: Sit to/from Stand Sit to Stand: Min guard         General transfer comment: min guard for safety  Ambulation/Gait Ambulation/Gait assistance: Min guard Gait Distance (Feet): 100 Feet Assistive device: Rolling walker (2 wheeled) Gait Pattern/deviations: Shuffle;Step-through pattern;Decreased  stride length;Narrow base of support Gait velocity: decreased Gait velocity interpretation: <1.31 ft/sec, indicative of household ambulator General Gait Details: requires RW for standing stability and support  Stairs            Wheelchair Mobility    Modified Rankin (Stroke Patients Only)       Balance     Sitting balance-Leahy Scale: Fair     Standing balance support: Bilateral upper extremity supported;During functional activity Standing balance-Leahy Scale: Poor Standing balance comment: maintains balance with RW                             Pertinent Vitals/Pain Pain Assessment: No/denies pain    Home Living Family/patient expects to be discharged to:: Private residence Living Arrangements: Alone Available Help at Discharge: Family;Available PRN/intermittently Type of Home: House Home Access: Stairs to enter Entrance Stairs-Rails: Right;Left Entrance Stairs-Number of Steps: 2 vs 5 Home Layout: Laundry or work area in basement;Two level Home Equipment: Shower seat - built in;Hand held Tourist information centre manager - 2 wheels;Cane - single point      Prior Function Level of Independence: Independent with assistive device(s)         Comments: denies falls but is charted that daughter reports some     Hand Dominance   Dominant Hand: Right    Extremity/Trunk Assessment   Upper Extremity Assessment Upper Extremity Assessment: Generalized weakness    Lower Extremity Assessment Lower Extremity Assessment: Generalized weakness  Cervical / Trunk Assessment Cervical / Trunk Assessment: Kyphotic  Communication   Communication: No difficulties  Cognition Arousal/Alertness: Awake/alert Behavior During Therapy: WFL for tasks assessed/performed Overall Cognitive Status: Within Functional Limits for tasks assessed Area of Impairment: Awareness                   Current Attention Level: Selective Memory: Decreased short-term memory;Decreased  recall of precautions Following Commands: Follows one step commands inconsistently;Follows one step commands with increased time Safety/Judgement: Decreased awareness of safety;Decreased awareness of deficits   Problem Solving: Decreased initiation        General Comments      Exercises     Assessment/Plan    PT Assessment Patient needs continued PT services  PT Problem List Decreased strength;Decreased range of motion;Decreased activity tolerance;Decreased balance;Decreased coordination;Decreased mobility;Decreased knowledge of use of DME;Decreased safety awareness;Decreased knowledge of precautions;Cardiopulmonary status limiting activity       PT Treatment Interventions DME instruction;Gait training;Functional mobility training;Stair training;Balance training;Therapeutic exercise;Therapeutic activities;Neuromuscular re-education;Patient/family education    PT Goals (Current goals can be found in the Care Plan section)  Acute Rehab PT Goals Patient Stated Goal: to go home PT Goal Formulation: With patient Time For Goal Achievement: 11/23/18 Potential to Achieve Goals: Good    Frequency Min 4X/week   Barriers to discharge Decreased caregiver support;Inaccessible home environment full flights to other level wtih pt needing to get up stairs to bedroom    Co-evaluation               AM-PAC PT "6 Clicks" Mobility  Outcome Measure Help needed turning from your back to your side while in a flat bed without using bedrails?: None Help needed moving from lying on your back to sitting on the side of a flat bed without using bedrails?: A Little Help needed moving to and from a bed to a chair (including a wheelchair)?: A Little Help needed standing up from a chair using your arms (e.g., wheelchair or bedside chair)?: A Little Help needed to walk in hospital room?: A Lot Help needed climbing 3-5 steps with a railing? : Total 6 Click Score: 16    End of Session Equipment  Utilized During Treatment: Gait belt;Oxygen Activity Tolerance: Patient tolerated treatment well;Other (comment)(PT handled O2 tank) Patient left: in bed;with call bell/phone within reach;with bed alarm set Nurse Communication: Mobility status PT Visit Diagnosis: Unsteadiness on feet (R26.81);Other abnormalities of gait and mobility (R26.89);Difficulty in walking, not elsewhere classified (R26.2);Repeated falls (R29.6)    Time: 0109-3235 PT Time Calculation (min) (ACUTE ONLY): 33 min   Charges:   PT Evaluation $PT Eval Moderate Complexity: 1 Mod PT Treatments $Gait Training: 8-22 mins       Ramond Dial 11/09/2018, 1:51 PM   Mee Hives, PT MS Acute Rehab Dept. Number: [place] and [place]

## 2018-11-09 NOTE — Progress Notes (Signed)
Patient admitted for hypoxia, arrived into the unit at about 5:30am. Patient accompanied by daughter.  RT also accompanied patient with bipap.  Bipap not connected because pt is resting with respirations even and unlabored.  Patient was placed on 4L [place] in the room. Cardiac monitoring and pulse ox connected. No sign of distress noted. Will continue to monitor.

## 2018-11-09 NOTE — ED Notes (Signed)
Admitting MD removed pt.'s BIPAP , pt. tolerated nasal cannula 4 lpm/[place] .

## 2018-11-09 NOTE — Progress Notes (Signed)
Patient transported to CT and back to ED without event.

## 2018-11-09 NOTE — ED Notes (Signed)
Admitting took off BiPAP at this time. O2 sats stable.

## 2018-11-09 NOTE — Progress Notes (Addendum)
Trying to titrate oxygen down from 4 L - on 2 L oxygen sat O2 is 97%. Patient is feeling well, no complaints. Will continue to monitor.

## 2018-11-09 NOTE — Progress Notes (Signed)
Patient unable to watch video HF - video is not working.

## 2018-11-09 NOTE — Progress Notes (Signed)
[facility]  Patient Status: Active (receiving services up to time of hospitalization)  [facility] is providing the following services: PT and OT  If patient discharges after hours, please call [phone].   William [name] 11/09/2018, 10:14 AM
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
## 2018-11-09 NOTE — Discharge Summary (Signed)
[facility] Discharge Summary  Patient name: William [name] Medical record number: [id number] Date of birth: June 10, 1941 Age: 78 y.o. Gender: male Date of Admission: 11/08/2018  Date of Discharge: 11/15/2018 Admitting Physician: Kinnie Feil, MD  Primary Care Provider: [name], DO Consultants: Nephro, Palliative  Indication for Hospitalization: Hypoxia, CHF exacerbation  Discharge Diagnoses/Problem List:  Hypoxia Acute on chronic diastolic CHF Abdominal Pain CKD stage 5 HTN HLD T2DM  Disposition: Stable  Discharge Condition: Improved  Discharge Exam:  General: well nourished, well developed, in no acute distress with non-toxic appearance HEENT: normocephalic, atraumatic, moist mucous membranes Neck: supple, normal ROM CV: regular rate and rhythm without murmurs, rubs, or gallops, no lower extremity edema, 2+ radial and pedal pulses Lungs: clear to auscultation bilaterally with normal work of breathing on room air Abdomen: soft, non-tender, non-distended, normoactive bowel sounds Skin: warm, dry, no rashes or lesions Extremities: warm and well perfused, normal tone Neuro: Alert and oriented, speech normal  Brief Hospital Course:  William [name] is a 78 y.o. male with past medical history significant for T2DM, renal cyst, subdural hematoma, HTN, HLD, who initially presented with periumbilical abdominal pain, nausea and NBNB vomiting that self resolved in the ED but then developed acute hypoxia felt to be secondary to CHF exacerbation. Hospital course outlined below.  Abdominal Pain: Patient presented initially complaining of severe periumbilical abdominal pain with a history of nausea, NBNB vomiting, and body aches. CT renal stone significant for small hiatal hernia, mild colon diverticulosis without diverticulitis and small left inguinal hernia containing fat, otherwise negative. His pain self resolved in the ED and throughout  remainder of admission he denied any further abdominal pain. He had episodes of intermittent nausea and vomiting felt to be secondary to uremia, which was improved for >48 hours prior to discharge. No further work up was obtained.   Hypoxia 2/2 to CHF Exacerbation: On presentation, patient acutely desatted to 80's and was placed on BiPAP. CXR showed bilateral interstitial opacities (R>L) and small pleural effusions. CT renal stone also showed moderate bilateral pleural effusions. BNP elevated on admission to 1146. Nephrology was consulted and he received IV lasix which improved his breathing significantly. There was small concern for PE as D-dimer was elevated to 5.67 on admission, however V/Q scan was negative for PE. Additionally, some concern for pneumonia given atypical infiltrates seen on x-ray and leukocytosis of 14.3, however patient remained afebrile without associated symptoms, thus antibiotics were held and patient was closely monitored. He was transitioned from IV lasix to PO with adequate urine output and improvement in his work of breathing. He was slowly weaned back to room air and was back to baseline prior to discharge.  Throughout admission, patient's symptoms improved daily and he was discharged in stable condition with close follow up arranged.  CKD V: Patient had right upper arm AVG placed on 10/20/18 in preparation for dialysis. He continued to have good urine output throughout admission but was felt to require dialysis once his AVG had matured.This was discussed with the patient and family and they agreed. He was discharged on PO lasix with follow up with Dr. Posey Pronto on 2/5 to confirm patency of AVG. Placement with HD center was initiated during admission. Per nephrology, expect outpatient dialysis to begin 1-2 weeks following discharge.  Goals of Care: Palliative was consulted during admission for goals of care discussion. Patient declared he would like to be DNI/DNR going forward.    Issues for Follow Up:  1.  Ensure patient follows up with Dr. Posey Pronto on 2/5 2. May benefit from outpatient palliative referral 3. Episode of low BP to 220'U systollically. Please monitor BP's and adjust blood pressure meds accordingly  Significant Procedures: V/Q scan, CT renal stone study  Significant Labs and Imaging:  Recent Labs  Lab 11/08/18 2236 11/11/18 0934 11/14/18 0644  WBC 14.3* 8.2 6.1  HGB 11.7* 10.6* 11.1*  HCT 35.9* 32.5* 33.0*  PLT 260 252 320   Recent Labs  Lab 11/10/18 1223 11/11/18 0934 11/12/18 0626 11/13/18 0559 11/14/18 0644  NA 137 138 137 136 137  K 3.9 4.2 4.0 4.0 4.1  CL 104 105 103 102 101  CO2 22 23 23 24 23   GLUCOSE 143* 186* 113* 115* 117*  BUN 41* 45* 44* 47* 53*  CREATININE 5.76* 6.41* 6.44* 6.60* 6.74*  CALCIUM 8.5* 8.5* 8.6* 8.4* 8.7*  PHOS 4.1 4.1 4.3 4.4 4.8*  ALBUMIN 2.8* 2.6* 2.5* 2.5* 2.8*   Urinalysis    Component Value Date/Time   COLORURINE STRAW (A) 11/09/2018 0244   APPEARANCEUR CLEAR 11/09/2018 0244   LABSPEC 1.009 11/09/2018 0244   PHURINE 7.0 11/09/2018 0244   GLUCOSEU NEGATIVE 11/09/2018 0244   HGBUR NEGATIVE 11/09/2018 0244   BILIRUBINUR NEGATIVE 11/09/2018 [place] 11/09/2018 0244   PROTEINUR 100 (A) 11/09/2018 0244   NITRITE NEGATIVE 11/09/2018 0244   LEUKOCYTESUR NEGATIVE 11/09/2018 0244    D-dimer 5.76 BNP 1,146.2 Troponin 0.0  Dg Chest 2 View  Result Date: 11/08/2018 CLINICAL DATA:  Wheezing. Weakness today. EXAM: CHEST - 2 VIEW COMPARISON:  Radiograph 10/04/2018. FINDINGS: Left-sided pacemaker in place. Heart is normal in size. Mild aortic atherosclerosis. Bilateral interstitial opacities, right greater than left. Small pleural effusions. No pneumothorax. No acute osseous abnormalities. IMPRESSION: Bilateral interstitial opacities, right greater than left, suspicious for pulmonary edema. Given asymmetric distribution, atypical infection is also considered. Small pleural effusions.  Electronically Signed   By: Keith Rake M.D.   On: 11/08/2018 22:33   Nm Pulmonary Perf And Vent  Result Date: 11/09/2018 CLINICAL DATA:  Suspected.  Positive D-dimer. EXAM: NUCLEAR MEDICINE VENTILATION - PERFUSION LUNG SCAN TECHNIQUE: Ventilation images were obtained in multiple projections using inhaled aerosol Tc-49m DTPA. Perfusion images were obtained in multiple projections after intravenous injection of Tc-34m MAA. RADIOPHARMACEUTICALS:  31.0 mCi of Tc-69m DTPA aerosol inhalation and 0.2 mCi Tc64m MAA IV COMPARISON:  Chest x-ray 11/08/2018. FINDINGS: Patchy ventilatory defects consistent previously identified pulmonary infiltrates/edema. No ventilation perfusion mismatches are noted. Pacemaker defect noted over the left chest. IMPRESSION: 1. Patchy ventilatory defects consistent with previously identified pulmonary infiltrates/edema 2. No evidence of pulmonary embolus. Perfusion scan is unremarkable. Electronically Signed   By: Marcello Moores  Register   On: 11/09/2018 11:30   Ct Renal Stone Study  Result Date: 11/09/2018 CLINICAL DATA:  78 y/o M; generalized weakness and worsening kidney failure. EXAM: CT ABDOMEN AND PELVIS WITHOUT CONTRAST TECHNIQUE: Multidetector CT imaging of the abdomen and pelvis was performed following the standard protocol without IV contrast. COMPARISON:  04/01/2007 MRI of the abdomen. 03/30/2007 CT of the abdomen. FINDINGS: Lower chest: Moderate bilateral pleural effusions. Small hiatal hernia. Hepatobiliary: No focal liver abnormality is seen. No gallstones, gallbladder wall thickening, or biliary dilatation. Pancreas: Unremarkable. No pancreatic ductal dilatation or surrounding inflammatory changes. Spleen: Normal in size without focal abnormality. Adrenals/Urinary Tract: Heterogeneous left kidney lower pole cystic lesion measures 3.4 cm, previously 2 cm in 2008. Kidneys are atrophic bilaterally. No urinary stone disease or hydronephrosis. Normal bladder. Stomach/Bowel:  Stomach is within normal limits. Appendix appears normal. No evidence of bowel wall thickening, distention, or inflammatory changes. Mild colon diverticulosis, no findings of acute diverticulitis. 3.8 cm duodenum diverticulum without associated inflammation. Vascular/Lymphatic: Aortic atherosclerosis. No enlarged abdominal or pelvic lymph nodes. Reproductive: Prostate is unremarkable. Other: Small left inguinal hernia containing fat. Musculoskeletal: No fracture is seen. Lumbar spondylosis predominantly at L4-5 and L5-S1 levels. IMPRESSION: 1. Moderate bilateral pleural effusions. 2. Small hiatal hernia. 3. Bilateral renal atrophy. 4. Heterogeneous left kidney lower pole complex cystic lesion measures up to 3.4 cm, increased in size from 2008 MRI of the abdomen where it is better characterized. 5. Mild colon diverticulosis, no findings of acute diverticulitis. 6. Small left inguinal hernia containing fat. 7. Aortic Atherosclerosis (ICD10-I70.0). Electronically Signed   By: Kristine Garbe M.D.   On: 11/09/2018 02:58   Results/Tests Pending at Time of Discharge: None  Discharge Medications:  Allergies as of 11/15/2018      Reactions   Carvedilol Other (See Comments)   Makes him feel like his pelvis is "grinding"      Medication List    TAKE these medications   amiodarone 200 MG tablet Commonly known as:  PACERONE Take 1 tablet (200 mg total) by mouth daily.   amLODipine 10 MG tablet Commonly known as:  NORVASC Take 1 tablet (10 mg total) by mouth daily.   cloNIDine 0.1 MG tablet Commonly known as:  CATAPRES Take 1 tablet (0.1 mg total) by mouth 2 (two) times daily.   feeding supplement (ENSURE ENLIVE) Liqd Take 237 mLs by mouth 2 (two) times daily between meals.   furosemide 40 MG tablet Commonly known as:  LASIX Take 1 tablet (40 mg total) by mouth 2 (two) times daily.   hydrALAZINE 100 MG tablet Commonly known as:  APRESOLINE Take 1 tablet (100 mg total) by mouth 3 (three)  times daily.   metoprolol tartrate 50 MG tablet Commonly known as:  LOPRESSOR Take 3 tablets (150 mg total) by mouth 2 (two) times daily.   pantoprazole 20 MG tablet Commonly known as:  PROTONIX Take 1 tablet (20 mg total) by mouth daily.   polyethylene glycol packet Commonly known as:  MIRALAX / GLYCOLAX Take 17 g by mouth daily.   rosuvastatin 10 MG tablet Commonly known as:  CRESTOR Take 1 tablet (10 mg total) by mouth daily at 6 PM.   sodium bicarbonate 650 MG tablet Take 1 tablet (650 mg total) by mouth 2 (two) times daily.       Discharge Instructions: Please refer to Patient Instructions section of EMR for full details.  Patient was counseled important signs and symptoms that should prompt return to medical care, changes in medications, dietary instructions, activity restrictions, and follow up appointments.   Follow-Up Appointments:  Contact information for follow-up providers    Elmarie Shiley, MD Follow up on 11/16/2018.   Specialty:  Nephrology Why:  please keep your appt w/ Dr Elmarie Shiley at 2 pm on Wed Feb 5th next week.  Contact information: [place] [address] [phone]            Contact information for after-discharge care    Destination    [facility] Preferred [facility]F .   Service:  Skilled Nursing Contact information: [place] [address] [place], [place], DO 11/15/2018, 1:22 PM PGY-1, [place]

## 2018-11-09 NOTE — Telephone Encounter (Signed)
Patients daughter(caregiver) is calling because she is interested in having a home Nurse with pt.   He is currently in the hospital and would like to get this process going before he leaves. [place], Salome Spotted, CMA

## 2018-11-09 NOTE — ED Notes (Signed)
Attempted to call report

## 2018-11-09 NOTE — Progress Notes (Addendum)
Family Medicine Teaching Service Daily Progress Note Intern Pager: [phone]  Patient name: William [name] Medical record number: [id number] Date of birth: May 28, 1941 Age: 78 y.o. Gender: male  Primary Care Provider: [name], DO Consultants: Nephro Code Status: DNR  Pt Overview and Major Events to Date:  1/29: admitted to [facility], V/Q scan neg for PE, CT stone neg  Assessment and Plan: William [name] is a 78 y.o. male presenting with generalized fatigue, hypoxia. 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 S/p 80mg  IV lasix yesterday with improvement in O2 requirements, currently on 2L O2 at 93-99%. 1.75L UOP over 24 hours. Initial concern for possible PNA given bilateral interstitial opacities, however patient has remained hemodynamically stable and afebrile overnight without any symptoms of cough. Initial D-dimer elevated, however V/Q scan negative for PE. Nephro consulted. Has remained hemodynamically stable and afebrile ON. - Per nephro, begin PO lasix 40mg  BID, will follow up recs - S/p 80mg  IV lasix on 1/29 - PT/OT consulted, will follow up - daily wts, strict I/O's, cardiac monitoring, pulse ox  - incentive spirometry  - continue to wean O2 as tolerated - consider oral lasix at discharge - palliative care consulted, will follow up - continue to hold antibiotics, will obtain repeat CXR and consider ABX if becomes febrile or has worsening resp status/cough  CKD V:  RUA AVG placement on 10/20/2018. Nephro consulted. UOP ON 1.75L.  Wt on admission 82kg, wt this AM 76.5kg. Elevated BUN may be causing nausea/vomiting symptoms. - Per nephro, patient open to dialysis when time comes, begin PO lasix 40mg  BID, f/u in 1 week after d/c with [facility] to set up outpt HD, continue NaHCO3 650mg   BID - avoid nephrotoxic agents  - follow up renal function panel  Abdominal pain  Nausea/Vomiting:  Still denies any abdominal pain. Some nausea and vomiting overnight. May be 2/2 to uremia (elevarted to 35) vs viral gastroenteritis vs constipation.  - continue GI cocktail/Miralax PRN - continue to monitor - consider further workup if symptoms worsen or abdominal pain recurs  T2DM- diet controlled  CBG's ON: 85-207 - continue monitoring with CBG  CAD s/p PCI, complete heart block with pacemaker. Home meds: amiodorone 200mg  daily, ASA 81mg  daily, rosuvastatin 10mg  daily - continue home meds  HTN- home meds: amlodipine 10mg  daily, clonipine 0.1mg  BID, hydralazine 100mg  TID, lopressor 50mg  BID BP this AM : 135/54 - continue home meds  Insomnia:  Endorses inability to sleep x 2 days.  - Melatonin qHS   Goals of Care: Palliative was consulted. Long talk with family. Patient has opted to be DNR/DNI. Family and patient was ready to start dialysis when indicated. Primary care giver of patient is daughter William [name], currently working on [place] as an outpatient.  - Per palliative, will consult care management for home health resources   FEN/GI: heart healthy, carb modified diet PPx:  lovenox   Disposition: Continue to wean O2, nephro recs  Subjective:  Patient feeling well this AM. Denies any chest pain or SOB. Some nausea and vomiting after evening meal but continues to deny any abdominal pain or diarrhea. Also notes inability to sleep x2 days. Otherwise denies any concerns or complaints.  Objective: Temp:  [97.7 F (36.5 C)-98.9 F (37.2 C)] 98.7 F (37.1 C) (01/30 0448) Pulse Rate:  [66-71] 66 (01/30  0448) Resp:  [16-20] 16 (01/30 0448) BP: (135-149)/(54-62) 135/54 (01/30 0448) SpO2:  [96 %-99 %] 99 % (01/30 0448) Weight:  [76.5 kg] 76.5 kg (01/30 0602) Physical Exam: General: well nourished, well developed, in no acute distress with non-toxic appearance, lying comfortably in bed  with nasal canula in place. HEENT: normocephalic, atraumatic, moist mucous membranes Neck: supple, normal ROM CV: regular rate and rhythm without murmurs, rubs, or gallops, no lower extremity edema Lungs: clear to auscultation bilaterally with normal work of breathing Abdomen: soft, non-tender, non-distended, normoactive bowel sounds Skin: warm, dry, no rashes or lesions Extremities: warm and well perfused, normal tone Neuro: Alert and oriented, speech normal  Laboratory: Recent Labs  Lab 11/08/18 2236  WBC 14.3*  HGB 11.7*  HCT 35.9*  PLT 260   Recent Labs  Lab 11/08/18 2236  NA 136  K 4.5  CL 105  CO2 19*  BUN 35*  CREATININE 4.96*  CALCIUM 9.2  GLUCOSE 170*   Urinalysis    Component Value Date/Time   COLORURINE STRAW (A) 11/09/2018 0244   APPEARANCEUR CLEAR 11/09/2018 0244   LABSPEC 1.009 11/09/2018 0244   PHURINE 7.0 11/09/2018 [place] 11/09/2018 0244   HGBUR NEGATIVE 11/09/2018 [place] 11/09/2018 [place] 11/09/2018 0244   PROTEINUR 100 (A) 11/09/2018 0244   NITRITE NEGATIVE 11/09/2018 0244   LEUKOCYTESUR NEGATIVE 11/09/2018 0244   D-dimer 5.76 BNP 1,146.2 Troponin 0.0  Imaging/Diagnostic Tests: Dg Chest 2 View  Result Date: 11/08/2018 CLINICAL DATA:  Wheezing. Weakness today. EXAM: CHEST - 2 VIEW COMPARISON:  Radiograph 10/04/2018. FINDINGS: Left-sided pacemaker in place. Heart is normal in size. Mild aortic atherosclerosis. Bilateral interstitial opacities, right greater than left. Small pleural effusions. No pneumothorax. No acute osseous abnormalities. IMPRESSION: Bilateral interstitial opacities, right greater than left, suspicious for pulmonary edema. Given asymmetric distribution, atypical infection is also considered. Small pleural effusions. Electronically Signed   By: Keith Rake M.D.   On: 11/08/2018 22:33   Nm Pulmonary Perf And Vent  Result Date: 11/09/2018 CLINICAL DATA:  Suspected.   Positive D-dimer. EXAM: NUCLEAR MEDICINE VENTILATION - PERFUSION LUNG SCAN TECHNIQUE: Ventilation images were obtained in multiple projections using inhaled aerosol Tc-64m DTPA. Perfusion images were obtained in multiple projections after intravenous injection of Tc-39m MAA. RADIOPHARMACEUTICALS:  31.0 mCi of Tc-45m DTPA aerosol inhalation and 0.2 mCi Tc62m MAA IV COMPARISON:  Chest x-ray 11/08/2018. FINDINGS: Patchy ventilatory defects consistent previously identified pulmonary infiltrates/edema. No ventilation perfusion mismatches are noted. Pacemaker defect noted over the left chest. IMPRESSION: 1. Patchy ventilatory defects consistent with previously identified pulmonary infiltrates/edema 2. No evidence of pulmonary embolus. Perfusion scan is unremarkable. Electronically Signed   By: Marcello Moores  Register   On: 11/09/2018 11:30   Ct Renal Stone Study  Result Date: 11/09/2018 CLINICAL DATA:  78 y/o M; generalized weakness and worsening kidney failure. EXAM: CT ABDOMEN AND PELVIS WITHOUT CONTRAST TECHNIQUE: Multidetector CT imaging of the abdomen and pelvis was performed following the standard protocol without IV contrast. COMPARISON:  04/01/2007 MRI of the abdomen. 03/30/2007 CT of the abdomen. FINDINGS: Lower chest: Moderate bilateral pleural effusions. Small hiatal hernia. Hepatobiliary: No focal liver abnormality is seen. No gallstones, gallbladder wall thickening, or biliary dilatation. Pancreas: Unremarkable. No pancreatic ductal dilatation or surrounding inflammatory changes. Spleen: Normal in size without focal abnormality. Adrenals/Urinary Tract: Heterogeneous left kidney lower pole cystic lesion measures 3.4 cm, previously 2 cm in 2008. Kidneys are atrophic bilaterally. No urinary stone disease or hydronephrosis.  Normal bladder. Stomach/Bowel: Stomach is within normal limits. Appendix appears normal. No evidence of bowel wall thickening, distention, or inflammatory changes. Mild colon diverticulosis, no  findings of acute diverticulitis. 3.8 cm duodenum diverticulum without associated inflammation. Vascular/Lymphatic: Aortic atherosclerosis. No enlarged abdominal or pelvic lymph nodes. Reproductive: Prostate is unremarkable. Other: Small left inguinal hernia containing fat. Musculoskeletal: No fracture is seen. Lumbar spondylosis predominantly at L4-5 and L5-S1 levels. IMPRESSION: 1. Moderate bilateral pleural effusions. 2. Small hiatal hernia. 3. Bilateral renal atrophy. 4. Heterogeneous left kidney lower pole complex cystic lesion measures up to 3.4 cm, increased in size from 2008 MRI of the abdomen where it is better characterized. 5. Mild colon diverticulosis, no findings of acute diverticulitis. 6. Small left inguinal hernia containing fat. 7. Aortic Atherosclerosis (ICD10-I70.0). Electronically Signed   By: Kristine Garbe M.D.   On: 11/09/2018 02:58     Danna Hefty, DO 11/10/2018, 9:47 AM PGY-1, [place] Intern pager: [phone], text pages welcome

## 2018-11-10 DIAGNOSIS — Z7189 Other specified counseling: Secondary | ICD-10-CM

## 2018-11-10 DIAGNOSIS — Z515 Encounter for palliative care: Secondary | ICD-10-CM

## 2018-11-10 LAB — RENAL FUNCTION PANEL
Albumin: 2.8 g/dL — ABNORMAL LOW (ref 3.5–5.0)
Anion gap: 11 (ref 5–15)
BUN: 41 mg/dL — ABNORMAL HIGH (ref 8–23)
CO2: 22 mmol/L (ref 22–32)
Calcium: 8.5 mg/dL — ABNORMAL LOW (ref 8.9–10.3)
Chloride: 104 mmol/L (ref 98–111)
Creatinine, Ser: 5.76 mg/dL — ABNORMAL HIGH (ref 0.61–1.24)
GFR calc Af Amer: 10 mL/min — ABNORMAL LOW (ref >60)
GFR calc non Af Amer: 9 mL/min — ABNORMAL LOW (ref >60)
Glucose, Bld: 143 mg/dL — ABNORMAL HIGH (ref 70–99)
Phosphorus: 4.1 mg/dL (ref 2.5–4.6)
Potassium: 3.9 mmol/L (ref 3.5–5.1)
Sodium: 137 mmol/L (ref 135–145)

## 2018-11-10 MED ORDER — FUROSEMIDE 40 MG PO TABS
40.0000 mg | ORAL_TABLET | Freq: Two times a day (BID) | ORAL | Status: DC
  Administered 2018-11-10 – 2018-11-15 (×10): 40 mg via ORAL
  Filled 2018-11-10 (×11): qty 1

## 2018-11-10 MED ORDER — TRAZODONE HCL 50 MG PO TABS
50.0000 mg | ORAL_TABLET | Freq: Once | ORAL | Status: AC | PRN
  Administered 2018-11-13: 50 mg via ORAL
  Filled 2018-11-10 (×2): qty 1

## 2018-11-10 MED ORDER — MELATONIN 3 MG PO TABS
3.0000 mg | ORAL_TABLET | Freq: Every day | ORAL | Status: DC
  Administered 2018-11-10: 3 mg via ORAL
  Filled 2018-11-10: qty 1

## 2018-11-10 NOTE — Progress Notes (Signed)
[facility] Progress Note    Assessment/ Plan:    78 y.o. male HTN DM CASHD s/p PCI HLD heart block w/ PM, amyloidosis and CKD IV with recent hospitalization for uncontrolled HTN + AMS. 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 Of note his Creatinine was already 2.25 a year ago but now cr in the 5's w/ UPC 5.4 leading to a RUA AVG placement on 10/20/2018. Bladder scan was negative for obstruction during recent hospitalization.  Here w/ abd pain noted to be constipated. Also being treated for volume overload.  1. CKD5 - He really wanted dialysis initially in order to spend a few more years with his children but was worried after speaking with friends on dialysis. I counseled him that he may not do well with dialysis but won't know till he's on it. I also reassured him that if he doesn't do well on HD he can always stop it and be made comfort care. He feels this is reasonable. - Continue  diuresis and hopefully his renal function remains stable. - No absolute indication to initiate dialysis  But may be getting uremic with anorexia and  Mild nausea. If we can wait 1 more week it'll be 4 weeks since the RUA AVG was placed; therefore, needs f/u within a week after d/c w/ [facility] to set up outpt HD. - Continue NaHCO3 650mg  BID. 2. HTN - better controlled after restarting home regimen; I question compliance. 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 4. Anemia - will check an iron panel. 5. DM 6. H/o sarcoidosis  Subjective:   Not sleeping well which is effecting him. Abd pain completely resolved. No f/c/n/v/dyspnea.  Poor appetite.   Objective:   BP (!) 135/54 (BP Location: Left Arm)   Pulse 66   Temp 98.7 F (37.1 C) (Oral)   Resp 16   Wt 76.5 kg   SpO2 99%   BMI 26.41 kg/m   Intake/Output Summary (Last 24 hours) at 11/10/2018 0720 Last data filed at 11/10/2018 0450 Gross per 24 hour  Intake 320 ml  Output 1750 ml  Net  -1430 ml   Weight change: -5.5 kg  Physical Exam: GEN: NAD, NCAT, pleasant HEENT: No conjunctival pallor, EOMI NECK: Supple, no thyromegaly LUNGS: CTA B/L no rales, rhonchi or wheezing CV: RRR, No M/R/G ABD: SNDNT +BS  EXT: Tr  edema   Imaging: Dg Chest 2 View  Result Date: 11/08/2018 CLINICAL DATA:  Wheezing. Weakness today. EXAM: CHEST - 2 VIEW COMPARISON:  Radiograph 10/04/2018. FINDINGS: Left-sided pacemaker in place. Heart is normal in size. Mild aortic atherosclerosis. Bilateral interstitial opacities, right greater than left. Small pleural effusions. No pneumothorax. No acute osseous abnormalities. IMPRESSION: Bilateral interstitial opacities, right greater than left, suspicious for pulmonary edema. Given asymmetric distribution, atypical infection is also considered. Small pleural effusions. Electronically Signed   By: Keith Rake M.D.   On: 11/08/2018 22:33   Nm Pulmonary Perf And Vent  Result Date: 11/09/2018 CLINICAL DATA:  Suspected.  Positive D-dimer. EXAM: NUCLEAR MEDICINE VENTILATION - PERFUSION LUNG SCAN TECHNIQUE: Ventilation images were obtained in multiple projections using inhaled aerosol Tc-38m DTPA. Perfusion images were obtained in multiple projections after intravenous injection of Tc-38m MAA. RADIOPHARMACEUTICALS:  31.0 mCi of Tc-31m DTPA aerosol inhalation and 0.2 mCi Tc99m MAA IV COMPARISON:  Chest x-ray 11/08/2018. FINDINGS: Patchy ventilatory defects consistent previously identified pulmonary infiltrates/edema. No ventilation perfusion mismatches are noted. Pacemaker defect noted over the left  chest. IMPRESSION: 1. Patchy ventilatory defects consistent with previously identified pulmonary infiltrates/edema 2. No evidence of pulmonary embolus. Perfusion scan is unremarkable. Electronically Signed   By: Marcello Moores  Register   On: 11/09/2018 11:30   Ct Renal Stone Study  Result Date: 11/09/2018 CLINICAL DATA:  78 y/o M; generalized weakness and worsening kidney  failure. EXAM: CT ABDOMEN AND PELVIS WITHOUT CONTRAST TECHNIQUE: Multidetector CT imaging of the abdomen and pelvis was performed following the standard protocol without IV contrast. COMPARISON:  04/01/2007 MRI of the abdomen. 03/30/2007 CT of the abdomen. FINDINGS: Lower chest: Moderate bilateral pleural effusions. Small hiatal hernia. Hepatobiliary: No focal liver abnormality is seen. No gallstones, gallbladder wall thickening, or biliary dilatation. Pancreas: Unremarkable. No pancreatic ductal dilatation or surrounding inflammatory changes. Spleen: Normal in size without focal abnormality. Adrenals/Urinary Tract: Heterogeneous left kidney lower pole cystic lesion measures 3.4 cm, previously 2 cm in 2008. Kidneys are atrophic bilaterally. No urinary stone disease or hydronephrosis. Normal bladder. Stomach/Bowel: Stomach is within normal limits. Appendix appears normal. No evidence of bowel wall thickening, distention, or inflammatory changes. Mild colon diverticulosis, no findings of acute diverticulitis. 3.8 cm duodenum diverticulum without associated inflammation. Vascular/Lymphatic: Aortic atherosclerosis. No enlarged abdominal or pelvic lymph nodes. Reproductive: Prostate is unremarkable. Other: Small left inguinal hernia containing fat. Musculoskeletal: No fracture is seen. Lumbar spondylosis predominantly at L4-5 and L5-S1 levels. IMPRESSION: 1. Moderate bilateral pleural effusions. 2. Small hiatal hernia. 3. Bilateral renal atrophy. 4. Heterogeneous left kidney lower pole complex cystic lesion measures up to 3.4 cm, increased in size from 2008 MRI of the abdomen where it is better characterized. 5. Mild colon diverticulosis, no findings of acute diverticulitis. 6. Small left inguinal hernia containing fat. 7. Aortic Atherosclerosis (ICD10-I70.0). Electronically Signed   By: Kristine Garbe M.D.   On: 11/09/2018 02:58    Labs: BMET Recent Labs  Lab 11/08/18 2236 11/09/18 2001  NA 136  --    K 4.5  --   CL 105  --   CO2 19*  --   GLUCOSE 170*  --   BUN 35*  --   CREATININE 4.96*  --   CALCIUM 9.2  --   PHOS  --  4.4   CBC Recent Labs  Lab 11/08/18 2236  WBC 14.3*  HGB 11.7*  HCT 35.9*  MCV 99.2  PLT 260    Medications:    . amiodarone  200 mg Oral Daily  . amLODipine  10 mg Oral Daily  . aspirin EC  81 mg Oral Daily  . cloNIDine  0.1 mg Oral BID  . enoxaparin (LOVENOX) injection  30 mg Subcutaneous Q24H  . hydrALAZINE  100 mg Oral TID  . metoprolol tartrate  150 mg Oral BID  . pantoprazole  20 mg Oral Daily  . rosuvastatin  10 mg Oral q1800  . sodium bicarbonate  650 mg Oral BID  . sodium chloride flush  3 mL Intravenous Once  . sodium chloride flush  3 mL Intravenous Q12H      Otelia Santee, MD 11/10/2018, 7:20 AM
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
## 2018-11-10 NOTE — Evaluation (Signed)
Occupational Therapy Evaluation Patient Details Name: William [name] MRN: [id number] DOB: 14-Jul-1941 Today's Date: 11/10/2018    History of Present Illness 78 y.o. male presenting with admission this AM early for hypoxia, pleural effusion, pulm edema, CHF and abd pain.  Has relieved pain but the pt is on 4L O2 with no previous use of O2.  PMHx:  altered mental status, AKI, hypertensive urgency, CKD, T2DM, HTN, CAD s/p PCI, HLD, complete heart block with pacemaker, SDH s/p craniotomy 2016 ([facility]).   Clinical Impression   Pt with decline in function and safety with ADLs and ADL mobility with decreased strength, balance, endurance and cognition. Pt with Poor dynamic balance and impaired STM. Pt requires mod - max A with LB ADLs and min guard A with RW for ADL mobility. Pt would benefit from acute OT services to address impairments to maximize level function and safety    Follow Up Recommendations  SNF;Supervision/Assistance - 24 hour    Equipment Recommendations  Other (comment)(TBD at next venue of care)    Recommendations for Other Services       Precautions / Restrictions Precautions Precautions: Fall Precaution Comments: O2 needed 4L Restrictions Weight Bearing Restrictions: No      Mobility Bed Mobility Overal bed mobility: Needs Assistance Bed Mobility: Supine to Sit;Sit to Supine     Supine to sit: Min assist Sit to supine: Min assist   General bed mobility comments: extra time, slow to initiate  Transfers Overall transfer level: Needs assistance Equipment used: Rolling walker (2 wheeled) Transfers: Sit to/from Stand Sit to Stand: Min guard         General transfer comment: min guard for safety    Balance Overall balance assessment: Needs assistance;History of Falls Sitting-balance support: Feet supported;No upper extremity supported Sitting balance-Leahy Scale: Fair     Standing balance support: Bilateral upper extremity supported;During functional  activity Standing balance-Leahy Scale: Poor                             ADL either performed or assessed with clinical judgement   ADL Overall ADL's : Needs assistance/impaired Eating/Feeding: Set up;Sitting   Grooming: Standing;Wash/dry face;Wash/dry hands;Min guard   Upper Body Bathing: Min guard;Sitting   Lower Body Bathing: Moderate assistance;Sit to/from stand;Cueing for safety   Upper Body Dressing : Min guard;Sitting   Lower Body Dressing: Maximal assistance;Sit to/from stand;Cueing for safety   Toilet Transfer: Min guard;Ambulation;RW;Cueing for safety   Toileting- Clothing Manipulation and Hygiene: Moderate assistance;Sit to/from stand;Cueing for safety       Functional mobility during ADLs: Min guard;Rolling walker;Cueing for safety       Vision Baseline Vision/History: Wears glasses Wears Glasses: At all times Patient Visual Report: No change from baseline       Perception     Praxis      Pertinent Vitals/Pain Pain Assessment: No/denies pain Pain Score: 0-No pain Pain Intervention(s): Monitored during session     Hand Dominance Right   Extremity/Trunk Assessment Upper Extremity Assessment Upper Extremity Assessment: Generalized weakness   Lower Extremity Assessment Lower Extremity Assessment: Defer to PT evaluation   Cervical / Trunk Assessment Cervical / Trunk Assessment: Kyphotic   Communication Communication Communication: No difficulties   Cognition Arousal/Alertness: Awake/alert Behavior During Therapy: WFL for tasks assessed/performed Overall Cognitive Status: No family/caregiver present to determine baseline cognitive functioning Area of Impairment: Awareness  Orientation Level: Disoriented to;Situation   Memory: Decreased short-term memory;Decreased recall of precautions Following Commands: Follows one step commands inconsistently;Follows one step commands with increased time Safety/Judgement:  Decreased awareness of safety;Decreased awareness of deficits   Problem Solving: Decreased initiation General Comments: pt stated that he was still eating lunch, however had opened his lunch tray, slow to initiate tassk   General Comments       Exercises     Shoulder Instructions      Home Living Family/patient expects to be discharged to:: Private residence Living Arrangements: Alone Available Help at Discharge: Family;Available PRN/intermittently Type of Home: House Home Access: Stairs to enter [employer] of Steps: 2 Entrance Stairs-Rails: Right;Left Home Layout: Laundry or work area in basement;Two level Alternate Level Stairs-Number of Steps: flight Alternate Level Stairs-Rails: Right Bathroom Shower/Tub: Walk-in shower;Tub/shower unit   Bathroom Toilet: Standard Bathroom Accessibility: Yes   Home Equipment: Shower seat - built in;Hand held Tourist information centre manager - 2 wheels;Cane - single point          Prior Functioning/Environment Level of Independence: Independent with assistive device(s)    ADL's / Homemaking Assistance Needed: pt reports that he was independent with selfcare/ADLs, toileting. His daughter cooks meals, home mgt   Comments: denies falls but is charted that daughter reports some        OT Problem List: Impaired balance (sitting and/or standing);Impaired vision/perception;Decreased cognition;Decreased safety awareness;Decreased knowledge of use of DME or AE;Pain      OT Treatment/Interventions: Self-care/ADL training;DME and/or AE instruction;Therapeutic activities;Cognitive remediation/compensation;Patient/family education;Balance training    OT Goals(Current goals can be found in the care plan section) Acute Rehab OT Goals Patient Stated Goal: to go home OT Goal Formulation: With patient Time For Goal Achievement: 11/24/18 Potential to Achieve Goals: Good ADL Goals Pt Will Perform Grooming: with set-up;with supervision;standing Pt  Will Perform Upper Body Bathing: with supervision;with set-up;sitting Pt Will Perform Lower Body Bathing: with min assist;sitting/lateral leans;sit to/from stand Pt Will Perform Upper Body Dressing: with supervision;with set-up;sitting Pt Will Transfer to Toilet: with supervision;ambulating Pt Will Perform Toileting - Clothing Manipulation and hygiene: with min assist;sit to/from stand  OT Frequency: Min 2X/week   Barriers to D/C: Decreased caregiver support          Co-evaluation              AM-PAC OT "6 Clicks" Daily Activity     Outcome Measure Help from another person eating meals?: None Help from another person taking care of personal grooming?: A Little Help from another person toileting, which includes using toliet, bedpan, or urinal?: A Lot Help from another person bathing (including washing, rinsing, drying)?: A Lot Help from another person to put on and taking off regular upper body clothing?: A Little Help from another person to put on and taking off regular lower body clothing?: A Lot 6 Click Score: 16   End of Session Equipment Utilized During Treatment: Rolling walker;Gait belt  Activity Tolerance: Patient tolerated treatment well Patient left: in bed;with bed alarm set  OT Visit Diagnosis: Unsteadiness on feet (R26.81);Other abnormalities of gait and mobility (R26.89);Repeated falls (R29.6);History of falling (Z91.81);Other symptoms and signs involving the nervous system (R29.898);Other symptoms and signs involving cognitive function                Time: 1334-1402 OT Time Calculation (min): 28 min Charges:  OT General Charges $OT Visit: 1 Visit OT Evaluation $OT Eval Moderate Complexity: 1 Mod OT Treatments $Self Care/Home Management : 8-22 mins  Emmit Alexanders [facility] 11/10/2018, 2:15 PM

## 2018-11-11 LAB — RENAL FUNCTION PANEL
Albumin: 2.6 g/dL — ABNORMAL LOW (ref 3.5–5.0)
Anion gap: 10 (ref 5–15)
BUN: 45 mg/dL — ABNORMAL HIGH (ref 8–23)
CO2: 23 mmol/L (ref 22–32)
Calcium: 8.5 mg/dL — ABNORMAL LOW (ref 8.9–10.3)
Chloride: 105 mmol/L (ref 98–111)
Creatinine, Ser: 6.41 mg/dL — ABNORMAL HIGH (ref 0.61–1.24)
GFR calc Af Amer: 9 mL/min — ABNORMAL LOW (ref >60)
GFR calc non Af Amer: 8 mL/min — ABNORMAL LOW (ref >60)
Glucose, Bld: 186 mg/dL — ABNORMAL HIGH (ref 70–99)
Phosphorus: 4.1 mg/dL (ref 2.5–4.6)
Potassium: 4.2 mmol/L (ref 3.5–5.1)
Sodium: 138 mmol/L (ref 135–145)

## 2018-11-11 LAB — GLUCOSE, CAPILLARY
Glucose-Capillary: 101 mg/dL — ABNORMAL HIGH (ref 70–99)
Glucose-Capillary: 170 mg/dL — ABNORMAL HIGH (ref 70–99)

## 2018-11-11 LAB — CBC
HCT: 32.5 % — ABNORMAL LOW (ref 39.0–52.0)
Hemoglobin: 10.6 g/dL — ABNORMAL LOW (ref 13.0–17.0)
MCH: 31.7 pg (ref 26.0–34.0)
MCHC: 32.6 g/dL (ref 30.0–36.0)
MCV: 97.3 fL (ref 80.0–100.0)
Platelets: 252 10*3/uL (ref 150–400)
RBC: 3.34 MIL/uL — ABNORMAL LOW (ref 4.22–5.81)
RDW: 12.6 % (ref 11.5–15.5)
WBC: 8.2 10*3/uL (ref 4.0–10.5)
nRBC: 0 % (ref 0.0–0.2)

## 2018-11-11 MED ORDER — SENNOSIDES-DOCUSATE SODIUM 8.6-50 MG PO TABS
1.0000 | ORAL_TABLET | Freq: Two times a day (BID) | ORAL | Status: DC
  Administered 2018-11-11 – 2018-11-13 (×5): 1 via ORAL
  Filled 2018-11-11 (×5): qty 1

## 2018-11-11 MED ORDER — POLYETHYLENE GLYCOL 3350 17 G PO PACK
17.0000 g | PACK | Freq: Two times a day (BID) | ORAL | Status: DC
  Administered 2018-11-11 – 2018-11-15 (×7): 17 g via ORAL
  Filled 2018-11-11 (×9): qty 1

## 2018-11-11 MED ORDER — MELATONIN 3 MG PO TABS
9.0000 mg | ORAL_TABLET | Freq: Every day | ORAL | Status: DC
  Administered 2018-11-11 – 2018-11-14 (×4): 9 mg via ORAL
  Filled 2018-11-11 (×4): qty 3

## 2018-11-11 NOTE — Progress Notes (Signed)
PT Cancellation Note  Patient Details Name: William [name] MRN: [id number] DOB: [birth year]   Cancelled Treatment:    Reason Eval/Treat Not Completed: Patient declined, no reason specified. Patient daughter requesting for [place] to come back later. Reports "he is resting." Will follow up as schedule allows.   Erick Blinks, SPT  Erick Blinks 11/11/2018, 10:49 AM
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
## 2018-11-11 NOTE — Care Management Important Message (Signed)
Important Message  Patient Details  Name: William [name] MRN: [id number] Date of Birth: 07/06/1941   Medicare Important Message Given:  Yes    [name] 11/11/2018, 3:13 PM

## 2018-11-11 NOTE — Progress Notes (Addendum)
[facility] Progress Note    Assessment/ Plan:    78 y.o. male HTN DM CASHD s/p PCI HLD heart block w/ PM, amyloidosis and CKD IV with recent hospitalization for uncontrolled HTN + AMS. 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 Of note his Creatinine was already 2.25 a year ago but now cr in the 5's w/ UPC 5.4 leading to a RUA AVG placement on 10/20/2018. Bladder scan was negative for obstruction during recent hospitalization.  Here w/ abd pain noted to be constipated. Also being treated for volume overload.  1. CKD5 - He really wanted dialysis initially in order to spend a few more years with his children but was worried after speaking with friends on dialysis.  We counseled him that he may not do well with dialysis but won't know till he's on it. We also reassured him that if he doesn't do well on HD he can always stop it and be made comfort care. He feels this is reasonable.  -  mildly uremic with anorexia and mild nausea, does not need HD acutely. AVG is 3 wks old, needs 4 weeks to use. OK for dc home, patient will see Dr Posey Pronto next week (has appt on Wed 2/5) and he will complete the CLIP process which we have started today. Patient should be able to start outpt HD in the next 1-2 weeks maximum. I have spoken w/ a daughter and a son on the phone outlining the plan and the notes as above.   - Continue NaHCO3 650mg  BID  2. HTN - better controlled after restarting home regimen 3. Vol excess/ CHF - off of nasal O2 today, diuresed 2.5 L here over 2 days. Will dc home on lasix 40 mg bid and fluid restriction 1200 cc/ d.   4. Anemia - Hb 10.5- 11.5 , no need esa at this time 5. DM 6. H/o sarcoidosis 7. Nausea - please send home w/ a Rx for antiemetics he can use prn until HD is up and started  Kelly Splinter MD [facility] pgr [phone]   11/11/2018, 11:05 AM    Subjective:   No abd pain , mild nausea, poor appetite.   No SOB, on room air.     Objective:   BP (!) 123/49   Pulse 67   Temp 97.9 F (36.6 C) (Oral)   Resp 16   Wt 76.6 kg   SpO2 97%   BMI 26.45 kg/m   Intake/Output Summary (Last 24 hours) at 11/11/2018 1038 Last data filed at 11/11/2018 0949 Gross per 24 hour  Intake 143 ml  Output 1350 ml  Net -1207 ml   Weight change: 0.1 kg  Physical Exam: GEN: NAD, NCAT, pleasant HEENT: No conjunctival pallor, EOMI NECK: Supple, no thyromegaly LUNGS: CTA B/L no rales, rhonchi or wheezing CV: RRR, No M/R/G ABD: SNDNT +BS  EXT: Tr  edema   Imaging: Nm Pulmonary Perf And Vent  Result Date: 11/09/2018 CLINICAL DATA:  Suspected.  Positive D-dimer. EXAM: NUCLEAR MEDICINE VENTILATION - PERFUSION LUNG SCAN TECHNIQUE: Ventilation images were obtained in multiple projections using inhaled aerosol Tc-52m DTPA. Perfusion images were obtained in multiple projections after intravenous injection of Tc-66m MAA. RADIOPHARMACEUTICALS:  31.0 mCi of Tc-52m DTPA aerosol inhalation and 0.2 mCi Tc48m MAA IV COMPARISON:  Chest x-ray 11/08/2018. FINDINGS: Patchy ventilatory defects consistent previously identified pulmonary infiltrates/edema. No ventilation perfusion mismatches are noted. Pacemaker defect noted over the left chest. IMPRESSION:  1. Patchy ventilatory defects consistent with previously identified pulmonary infiltrates/edema 2. No evidence of pulmonary embolus. Perfusion scan is unremarkable. Electronically Signed   By: Marcello Moores  Register   On: 11/09/2018 11:30    Labs: BMET Recent Labs  Lab 11/08/18 2236 11/09/18 2001 11/10/18 1223 11/11/18 0934  NA 136  --  137 138  K 4.5  --  3.9 4.2  CL 105  --  104 105  CO2 19*  --  22 23  GLUCOSE 170*  --  143* 186*  BUN 35*  --  41* 45*  CREATININE 4.96*  --  5.76* 6.41*  CALCIUM 9.2  --  8.5* 8.5*  PHOS  --  4.4 4.1 4.1   CBC Recent Labs  Lab 11/08/18 2236 11/11/18 0934  WBC 14.3* 8.2  HGB 11.7* 10.6*  HCT 35.9* 32.5*  MCV 99.2 97.3  PLT 260  252    Medications:    . amiodarone  200 mg Oral Daily  . amLODipine  10 mg Oral Daily  . aspirin EC  81 mg Oral Daily  . cloNIDine  0.1 mg Oral BID  . enoxaparin (LOVENOX) injection  30 mg Subcutaneous Q24H  . furosemide  40 mg Oral BID  . hydrALAZINE  100 mg Oral TID  . Melatonin  9 mg Oral QHS  . metoprolol tartrate  150 mg Oral BID  . pantoprazole  20 mg Oral Daily  . polyethylene glycol  17 g Oral BID  . rosuvastatin  10 mg Oral q1800  . senna-docusate  1 tablet Oral BID  . sodium bicarbonate  650 mg Oral BID  . sodium chloride flush  3 mL Intravenous Once  . sodium chloride flush  3 mL Intravenous Q12H

## 2018-11-11 NOTE — Progress Notes (Signed)
Family Medicine Teaching Service Daily Progress Note Intern Pager: [phone]  Patient name: William [name] Medical record number: [id number] Date of birth: 1941-10-09 Age: 78 y.o. Gender: male  Primary Care Provider: [name], DO Consultants: Nephro Code Status: DNR  Pt Overview and Major Events to Date:  1/29: admitted to [facility], V/Q scan neg for PE, CT stone neg 1/31: weaned to room air, Safe for discharge per nephro, work up for SNF placement/HH initiated  Assessment and Plan: William [name] is a 78 y.o. male presenting with generalized fatigue, hypoxia. PMH is significant for AMS, CKDIII, HTN, T2DM, CAD s/p PCI, HLD, complete heart block with pacemaker, amyloidosis   Hypoxia 2/2 HFpEF exacerbation: improving Switched to PO lasix yesterday. 1.35L out overnight. Weaned to room air this morning with stable vitals and normal work of breathing. Has remained hemodynamically stable and afebrile. OT recommending SNF.  - Per nephro, clear for discharge, f/u Dr. Posey Pronto on 2/5, CLIP process started, dialysis in 1-2 weeks, D/c home with PO 40mg  lasix BID and PRN antiemetic (phenergan/zofran) - PT/OT consulted, will follow up - recommending SNF - CSW and care management consulted for SNF vs H/H - process for placement initiated - daily wts, strict I/O's, cardiac monitoring, pulse ox  - incentive spirometry   CKD V:  RUA AVG placement on 10/20/2018. Nephro consulted. UOP ON 1.35L.  Improvement in nausea/vomiting, tolerating PO. - see nephro recs above - avoid nephrotoxic agents  - follow up renal function panel  Abdominal pain  Nausea/Vomiting: resolved Tolerating PO this morning, denies N/V. Complaining of some constipation. Abdomen firm on palpation. - Miralax BID, Senna BID - continue GI cocktail PRN - continue to monitor - consider further workup if symptoms worsen or abdominal pain recurs  T2DM- diet controlled  CBG's ON: 100-170 - continue monitoring with CBG  CAD s/p  PCI, complete heart block with pacemaker. Home meds: amiodorone 200mg  daily, ASA 81mg  daily, rosuvastatin 10mg  daily - continue home meds  HTN- home meds: amlodipine 10mg  daily, clonipine 0.1mg  BID, hydralazine 100mg  TID, lopressor 50mg  BID BP this AM : 133/60 - continue home meds  Insomnia:  Endorses inability to sleep x 2 days.  - Melatonin qHS   Goals of Care: Patient has opted to be DNR/DNI. Family and patient was ready to start dialysis when indicated. Primary care giver of patient is daughter William [name], currently working on [place] as an outpatient. Family would like SNF placement if insurance will pay for it or H/H for more assistance at home.  - Palliative consulted, appreciate recs - Care management and CSW consulted for SNF vs H/H placement, will follow up.   Insomnia: Unable to sleep for several nights.  - Incrase to 9mg  Melatonin qHS  FEN/GI: heart healthy, carb modified diet PPx:  lovenox   Disposition: Pending SNF placement vs Home with H/H  Subjective:  Patient feeling well this AM. Denies any chest pain or SOB, nausea, or vomiting. Tolerating PO this morning. Still unable to sleep ON. Refused labs at 0400 this mroning but will get them this morning. Daughter at bedside with questions concerning plans. Otherwise denies any concerns or complaints.  Objective: Temp:  [97.9 F (36.6 C)-99.1 F (37.3 C)] 97.9 F (36.6 C) (01/31 0845) Pulse Rate:  [65-68] 67 (01/31 0616) Resp:  [16] 16 (01/31 0616) BP: (117-135)/(51-66) 133/60 (01/31 0616) SpO2:  [97 %-100 %] 97 % (01/31 0616) Weight:  [76.6 kg] 76.6 kg (01/31 0616) Physical Exam: General: well nourished, well developed, in no acute  distress with non-toxic appearance, lying comfortably in bed eating breakfast, daughter at bedside HEENT: normocephalic, atraumatic, moist mucous membranes Neck: supple, normal ROM CV: regular rate and rhythm without murmurs, rubs, or gallops Lungs: clear to auscultation bilaterally with  normal work of breathing Abdomen: firm and slightly distended to palpation but non-tender, normoactive bowel sounds Skin: warm, dry, no rashes or lesions Neuro: Alert and oriented, speech normal  Laboratory: Recent Labs  Lab 11/08/18 2236  WBC 14.3*  HGB 11.7*  HCT 35.9*  PLT 260   Recent Labs  Lab 11/08/18 2236 11/10/18 1223  NA 136 137  K 4.5 3.9  CL 105 104  CO2 19* 22  BUN 35* 41*  CREATININE 4.96* 5.76*  CALCIUM 9.2 8.5*  GLUCOSE 170* 143*   D-dimer 5.76 BNP 1,146.2 Troponin 0.0  Imaging/Diagnostic Tests: No results found.  Mina Marble [place], DO 11/11/2018, 9:30 AM PGY-1, [place] Intern pager: [phone], text pages welcome

## 2018-11-11 NOTE — Clinical Social Work Note (Signed)
Clinical Social Work Assessment  Patient Details  Name: William [name] MRN: [id number] Date of Birth: 04/26/1941  Date of referral:  11/11/18               Reason for consult:  Facility Placement                Permission sought to share information with:  Facility Sport and exercise psychologist, Family Supports Permission granted to share information::  Yes, Verbal Permission Granted  Name::     Health visitor::  SNFs  Relationship::  Daughter/POA  Contact Information:  [phone]  Housing/Transportation Living arrangements for the past 2 months:  [place] of Information:  Patient, Adult Children Patient Interpreter Needed:  None Criminal Activity/Legal Involvement Pertinent to Current Situation/Hospitalization:  No - Comment as needed Significant Relationships:  Adult Children Lives with:  Adult Children, Self Do you feel safe going back to the place where you live?  No Need for family participation in patient care:  Yes (Comment)  Care giving concerns:  CSW received consult for possible SNF placement at time of discharge. CSW spoke with patient and daughter at bedside regarding PT recommendation of SNF placement at time of discharge. Patient's daughter reported that patient has been staying at her house but she would like him to go to rehab. Patient expressed understanding of PT recommendation and is agreeable to SNF placement at time of discharge. CSW to continue to follow and assist with discharge planning needs.   Social Worker assessment / plan:  CSW spoke with patient concerning possibility of rehab at [facility] before returning home.  Employment status:  Retired Nurse, adult PT Recommendations:  [place] / Referral to community resources:  [place]  Patient/Family's Response to care:  Patient not wanting to talk right now. Patient's daughter at first asking CSW to set up home health services  and order a hospital bed if insurance approves it. Then she stated that patient was going to rehab. CSW explained that they are two different discharge plans and that rehab would need to be authorized by insurance. She repots understanding and requests [facility]. CSW will ask them to start insurance authorization. Daughter is aware that patient was denied SNF last admission and the same thing might happen this time. Insurance is also closed on the weekend.   Patient/Family's Understanding of and Emotional Response to Diagnosis, Current Treatment, and Prognosis:  Patient/family is realistic regarding therapy needs and expressed being hopeful for SNF placement. Patient expressed understanding of CSW role and discharge process as well as medical condition. No questions/concerns about plan or treatment.    Emotional Assessment Appearance:  Appears stated age Attitude/Demeanor/Rapport:  Self-Absorbed Affect (typically observed):  Accepting, Quiet Orientation:  Oriented to Self, Oriented to Place, Oriented to  Time, Oriented to Situation Alcohol / Substance use:  Not Applicable Psych involvement (Current and /or in the community):  No (Comment)  Discharge Needs  Concerns to be addressed:  Care Coordination Readmission within the last 30 days:  Yes Current discharge risk:  None Barriers to Discharge:  Continued Medical Work up   [employer], LCSW 11/11/2018, 11:57 AM

## 2018-11-11 NOTE — Progress Notes (Signed)
Family Medicine Teaching Service Daily Progress Note Intern Pager: [phone]  Patient name: William [name] Medical record number: [id number] Date of birth: 23-Jul-1941 Age: 78 y.o. Gender: male  Primary Care Provider: [name], DO Consultants: Nephro Code Status: DNR  Pt Overview and Major Events to Date:  1/29: admitted to [facility], V/Q scan neg for PE, CT stone neg 1/31: weaned to room air, Safe for discharge per nephro, work up for SNF placement/HH initiated  Assessment and Plan: William [name] is a 78 y.o. male presenting with generalized fatigue, hypoxia 2/2 CHF exacerbation. PMH is significant for CKDIII, HTN, T2DM, CAD s/p PCI, HLD, complete heart block with pacemaker, amyloidosis   HFpEF exacerbation, improved. Now on room air and appears euvolemic. UOP 2050cc out in last 24h, net 3.8L for hospital stay. Weight down from 180 to 168lbs. 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 Still with some intermittent mild nausea likely due to slightly elevated BUN but no acute need for HD. No nausea this morning - Per nephro, stable for DC home with Rx for antiemetics. Setup for outpt f/u and to complete CLIP process - continue sodium bicarb 650mg  BID - prn zofran  - am renal function panel still pending  T2DM- diet controlled  - continue glucose monitoring on renal function panel  CAD s/p PCI, complete heart block with pacemaker. Stable. Home meds: amiodorone 200mg  daily, ASA 81mg  daily, rosuvastatin 10mg  daily - continue home meds  HTN, stable.home meds: amlodipine 10mg  daily, clonipine 0.1mg  BID, hydralazine 100mg  TID, lopressor 150mg  BID - continue home meds  Insomnia:  - Melatonin qHS   Goals of Care: - Per palliative, patient wishes to trial HD and MOST form completed. May benefit from outpatient palliative referral. - Care management and CSW  consulted for SNF vs H/H placement, will follow up.   Constipation, last BM 2 days ago.  - mirlalax BID - senakot BID - glycerin suppository x1  FEN/GI: heart healthy, carb modified diet PPx:  lovenox   Disposition: Pending SNF placement vs Home with H/H. Per daughter [name] [facility]), if insurance is barrier to SNF placement she has opportunity to change enrollment until Feb 15.    Subjective:  Patient states he overall feels well this morning, breathing is back to normal. He denies nausea. His only complaint is that he has not had a bowel movement in 2 days and is requesting a suppository. He has no abdominal pain or discomfort.  Objective: Temp:  [97.9 F (36.6 C)-98.6 F (37 C)] 98.4 F (36.9 C) (02/01 0611) Pulse Rate:  [60-66] 62 (02/01 0611) Resp:  [16-18] 16 (01/31 2112) BP: (117-132)/(47-60) 132/60 (02/01 0611) SpO2:  [96 %-99 %] 99 % (02/01 1027) Physical Exam: General: laying in bed comfortably in NAD Cardiovascular: RRR, no murmurs.  Respiratory: CTAB, NWOB on RA Abdomen: soft, nontender, nondistended + bowel sounds Extremities: warm and well perfused. No LE edema Neuro: alert and awake, grossly normal. Psych: appropriate affect.  Laboratory: Recent Labs  Lab 11/08/18 2236 11/11/18 0934  WBC 14.3* 8.2  HGB 11.7* 10.6*  HCT 35.9* 32.5*  PLT 260 252   Recent Labs  Lab 11/08/18 2236 11/10/18 1223 11/11/18 0934  NA 136 137 138  K 4.5 3.9 4.2  CL 105 104 105  CO2 19* 22 23  BUN 35* 41* 45*  CREATININE 4.96* 5.76* 6.41*  CALCIUM  9.2 8.5* 8.5*  GLUCOSE 170* 143* 186*     Imaging/Diagnostic Tests: Dg Chest 2 View  Result Date: 11/08/2018 CLINICAL DATA:  Wheezing. Weakness today. EXAM: CHEST - 2 VIEW COMPARISON:  Radiograph 10/04/2018. FINDINGS: Left-sided pacemaker in place. Heart is normal in size. Mild aortic atherosclerosis. Bilateral interstitial opacities, right greater than left. Small pleural effusions. No pneumothorax. No acute osseous  abnormalities. IMPRESSION: Bilateral interstitial opacities, right greater than left, suspicious for pulmonary edema. Given asymmetric distribution, atypical infection is also considered. Small pleural effusions. Electronically Signed   By: Keith Rake M.D.   On: 11/08/2018 22:33   Nm Pulmonary Perf And Vent  Result Date: 11/09/2018 CLINICAL DATA:  Suspected.  Positive D-dimer. EXAM: NUCLEAR MEDICINE VENTILATION - PERFUSION LUNG SCAN TECHNIQUE: Ventilation images were obtained in multiple projections using inhaled aerosol Tc-69m DTPA. Perfusion images were obtained in multiple projections after intravenous injection of Tc-31m MAA. RADIOPHARMACEUTICALS:  31.0 mCi of Tc-87m DTPA aerosol inhalation and 0.2 mCi Tc67m MAA IV COMPARISON:  Chest x-ray 11/08/2018. FINDINGS: Patchy ventilatory defects consistent previously identified pulmonary infiltrates/edema. No ventilation perfusion mismatches are noted. Pacemaker defect noted over the left chest. IMPRESSION: 1. Patchy ventilatory defects consistent with previously identified pulmonary infiltrates/edema 2. No evidence of pulmonary embolus. Perfusion scan is unremarkable. Electronically Signed   By: Marcello Moores  Register   On: 11/09/2018 11:30   Ct Renal Stone Study  Result Date: 11/09/2018 CLINICAL DATA:  78 y/o M; generalized weakness and worsening kidney failure. EXAM: CT ABDOMEN AND PELVIS WITHOUT CONTRAST TECHNIQUE: Multidetector CT imaging of the abdomen and pelvis was performed following the standard protocol without IV contrast. COMPARISON:  04/01/2007 MRI of the abdomen. 03/30/2007 CT of the abdomen. FINDINGS: Lower chest: Moderate bilateral pleural effusions. Small hiatal hernia. Hepatobiliary: No focal liver abnormality is seen. No gallstones, gallbladder wall thickening, or biliary dilatation. Pancreas: Unremarkable. No pancreatic ductal dilatation or surrounding inflammatory changes. Spleen: Normal in size without focal abnormality. Adrenals/Urinary  Tract: Heterogeneous left kidney lower pole cystic lesion measures 3.4 cm, previously 2 cm in 2008. Kidneys are atrophic bilaterally. No urinary stone disease or hydronephrosis. Normal bladder. Stomach/Bowel: Stomach is within normal limits. Appendix appears normal. No evidence of bowel wall thickening, distention, or inflammatory changes. Mild colon diverticulosis, no findings of acute diverticulitis. 3.8 cm duodenum diverticulum without associated inflammation. Vascular/Lymphatic: Aortic atherosclerosis. No enlarged abdominal or pelvic lymph nodes. Reproductive: Prostate is unremarkable. Other: Small left inguinal hernia containing fat. Musculoskeletal: No fracture is seen. Lumbar spondylosis predominantly at L4-5 and L5-S1 levels. IMPRESSION: 1. Moderate bilateral pleural effusions. 2. Small hiatal hernia. 3. Bilateral renal atrophy. 4. Heterogeneous left kidney lower pole complex cystic lesion measures up to 3.4 cm, increased in size from 2008 MRI of the abdomen where it is better characterized. 5. Mild colon diverticulosis, no findings of acute diverticulitis. 6. Small left inguinal hernia containing fat. 7. Aortic Atherosclerosis (ICD10-I70.0). Electronically Signed   By: Kristine Garbe M.D.   On: 11/09/2018 02:58     Bufford Lope, DO 11/12/2018, 6:37 AM PGY-3, [place] Intern pager: [phone], text pages welcome

## 2018-11-11 NOTE — Progress Notes (Signed)
SATURATION QUALIFICATIONS: (This note is used to comply with regulatory documentation for home oxygen)  Patient Saturations on Room Air at Rest = 94%  Patient Saturations on Room Air while Ambulating = 91%   Please briefly explain why patient needs home oxygen: Pt able to maintain adequate oxygen saturations without supplemental oxygen during ambulation. Pt oxygen sats ranging from 91-94% on RA throughout mobility tasks.  Leighton Ruff, PT, DPT  Acute Rehabilitation Services  Pager: [phone] Office: [phone]

## 2018-11-11 NOTE — NC FL2 (Signed)
[place] LEVEL OF CARE SCREENING TOOL     IDENTIFICATION  Patient Name: William [name] Birthdate: 14-Mar-1941 Sex: male Admission Date (Current Location): 11/08/2018  [facility] and [place] Number:  Herbalist and Address:  [facility] [place] [address], [place], [place] [address]      Provider Number: [id number]  Attending Physician Name and Address:  Dickie La, MD  Relative Name and Phone Number:  Develle Sievers, Daughter, [phone]; Zamir Staples, Daughter, [phone]; Angelito Hopping, Daughter, [phone]    Current Level of Care: Hospital Recommended Level of Care: [place] Prior Approval Number:    Date Approved/Denied:   PASRR Number: [id number]  Discharge Plan: SNF    Current Diagnoses: Patient Active Problem List   Diagnosis Date Noted  . Hypoxia 11/09/2018  . Acute pulmonary edema (HCC)   . Acute on chronic diastolic congestive heart failure ([place])   . Abdominal pain   . Chronic kidney disease (CKD), stage IV (severe) ([place])   . Renal cyst 10/17/2018  . Ventricular tachycardia ([place])   . Decreased activities of daily living (ADL)   . Palliative care by specialist   . 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 Goals of care, counseling/discussion   . Diarrhea   . Dehydration   . Dementia without behavioral disturbance ([place])   . Hypertensive urgency   . Altered mental status   . AKI (acute kidney injury) ([place]) 10/04/2018  . Type 2 diabetes mellitus without complication, without long-term current use of insulin ([place]) 10/30/2017  . Benign essential HTN 10/30/2017  . Hyperlipidemia 10/30/2017    Orientation RESPIRATION BLADDER Height & Weight     Self, Time, Situation, Place  Normal Continent Weight: 76.6 kg Height:     BEHAVIORAL SYMPTOMS/MOOD NEUROLOGICAL BOWEL NUTRITION STATUS      Continent Diet(Please see DC Summary)  AMBULATORY STATUS COMMUNICATION  OF NEEDS Skin   Limited Assist Verbally Surgical wounds(Closed incision on arm)                       Personal Care Assistance Level of Assistance  Bathing, Feeding, Dressing, Total care Bathing Assistance: Limited assistance Feeding assistance: Independent Dressing Assistance: Limited assistance Total Care Assistance: Limited assistance   Functional Limitations Info  Sight, Hearing, Speech Sight Info: Adequate Hearing Info: Adequate Speech Info: Adequate    SPECIAL CARE FACTORS FREQUENCY  PT (By licensed PT), OT (By licensed OT)     PT Frequency: 5x/week OT Frequency: 3x/week            Contractures Contractures Info: Not present    Additional Factors Info  Code Status, Allergies, Psychotropic Code Status Info: DNR Allergies Info: Carvedilol Psychotropic Info: Clonidine         Current Medications (11/11/2018):  This is the current hospital active medication list Current Facility-Administered Medications  Medication Dose Route Frequency Provider Last Rate Last Dose  . 0.9 %  sodium chloride infusion  250 mL Intravenous PRN Ouida Sills, Chelsey L, DO      . acetaminophen (TYLENOL) tablet 650 mg  650 mg Oral Q4H PRN Anderson, Chelsey L, DO      . alum & mag hydroxide-simeth (MAALOX/MYLANTA) 200-200-20 MG/5ML suspension 30 mL  30 mL Oral Q6H PRN Ouida Sills, Chelsey L, DO      . amiodarone (PACERONE) tablet 200 mg  200 mg Oral Daily Anderson, Chelsey L, DO   200 mg  at 11/11/18 0942  . amLODipine (NORVASC) tablet 10 mg  10 mg Oral Daily Anderson, Chelsey L, DO   10 mg at 11/11/18 0944  . aspirin EC tablet 81 mg  81 mg Oral Daily Anderson, Chelsey L, DO   81 mg at 11/11/18 0940  . cloNIDine (CATAPRES) tablet 0.1 mg  0.1 mg Oral BID Ouida Sills, Chelsey L, DO   0.1 mg at 11/11/18 6294  . enoxaparin (LOVENOX) injection 30 mg  30 mg Subcutaneous Q24H Anderson, Chelsey L, DO   30 mg at 11/11/18 0945  . furosemide (LASIX) tablet 40 mg  40 mg Oral BID Dwana Melena, MD   40 mg at  11/11/18 0943  . hydrALAZINE (APRESOLINE) tablet 100 mg  100 mg Oral TID Ouida Sills, Chelsey L, DO   100 mg at 11/11/18 0943  . Melatonin TABS 9 mg  9 mg Oral QHS Mullis, Kiersten P, DO      . metoprolol tartrate (LOPRESSOR) tablet 150 mg  150 mg Oral BID Anderson, Chelsey L, DO   150 mg at 11/11/18 0943  . ondansetron (ZOFRAN) injection 4 mg  4 mg Intravenous Q6H PRN Ouida Sills, Chelsey L, DO   4 mg at 11/10/18 7654  . pantoprazole (PROTONIX) EC tablet 20 mg  20 mg Oral Daily Anderson, Chelsey L, DO   20 mg at 11/11/18 0941  . polyethylene glycol (MIRALAX / GLYCOLAX) packet 17 g  17 g Oral BID Mullis, Kiersten P, DO   17 g at 11/11/18 0954  . rosuvastatin (CRESTOR) tablet 10 mg  10 mg Oral q1800 Anderson, Chelsey L, DO   10 mg at 11/10/18 1820  . senna-docusate (Senokot-S) tablet 1 tablet  1 tablet Oral BID Mullis, Kiersten P, DO   1 tablet at 11/11/18 0954  . sodium bicarbonate tablet 650 mg  650 mg Oral BID Anderson, Chelsey L, DO   650 mg at 11/11/18 6503  . sodium chloride flush (NS) 0.9 % injection 3 mL  3 mL Intravenous Once Anderson, Chelsey L, DO      . sodium chloride flush (NS) 0.9 % injection 3 mL  3 mL Intravenous Q12H Anderson, Chelsey L, DO   3 mL at 11/11/18 0949  . sodium chloride flush (NS) 0.9 % injection 3 mL  3 mL Intravenous PRN Ouida Sills, Chelsey L, DO      . traZODone (DESYREL) tablet 50 mg  50 mg Oral Once PRN Benay Pike, MD         Discharge Medications: Please see discharge summary for a list of discharge medications.  Relevant Imaging Results:  Relevant Lab Results:   Additional Information SSN: [id number]  Benard Halsted, LCSW

## 2018-11-11 NOTE — Progress Notes (Addendum)
MD on call notified that patient has refused all labs this morning. Per pt he will get them taken later he jsut didn't want them drawn when the phlebotomist came in around 04:30 d/t wanting to sleep. RN explained to pt that labs are drawn early in the morning so they can be ready when MDs come in the morning. On call MD aware that labs will have to be re-ordered this morning.  Will continue to monitor the pt. Hoover Brunette, RN

## 2018-11-12 LAB — RENAL FUNCTION PANEL
Albumin: 2.5 g/dL — ABNORMAL LOW (ref 3.5–5.0)
Anion gap: 11 (ref 5–15)
BUN: 44 mg/dL — ABNORMAL HIGH (ref 8–23)
CO2: 23 mmol/L (ref 22–32)
Calcium: 8.6 mg/dL — ABNORMAL LOW (ref 8.9–10.3)
Chloride: 103 mmol/L (ref 98–111)
Creatinine, Ser: 6.44 mg/dL — ABNORMAL HIGH (ref 0.61–1.24)
GFR calc Af Amer: 9 mL/min — ABNORMAL LOW (ref >60)
GFR calc non Af Amer: 8 mL/min — ABNORMAL LOW (ref >60)
Glucose, Bld: 113 mg/dL — ABNORMAL HIGH (ref 70–99)
Phosphorus: 4.3 mg/dL (ref 2.5–4.6)
Potassium: 4 mmol/L (ref 3.5–5.1)
Sodium: 137 mmol/L (ref 135–145)

## 2018-11-12 LAB — GLUCOSE, CAPILLARY: Glucose-Capillary: 104 mg/dL — ABNORMAL HIGH (ref 70–99)

## 2018-11-12 MED ORDER — GLYCERIN (LAXATIVE) 2.1 G RE SUPP
1.0000 | Freq: Once | RECTAL | Status: AC
  Administered 2018-11-12: 1 via RECTAL
  Filled 2018-11-12: qty 1

## 2018-11-12 NOTE — Progress Notes (Signed)
[facility] Progress Note    Assessment/ Plan:    78 y.o. male HTN DM CASHD s/p PCI HLD heart block w/ PM, amyloidosis and CKD IV with recent hospitalization for uncontrolled HTN + AMS. 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 His Creatinine was 2.25 a year ago but now cr in the 5's and 6's w/ UPC 5.4 leading to a RUA AVG placement on 10/20/2018- followed as OP by Dr. Posey Pronto.  1. CKD5 - He really wanted dialysis initially in order to spend a few more years with his children but was worried after speaking with friends on dialysis.  We counseled him that he may not do well with dialysis but won't know till he's on it. We also reassured him that if he doesn't do well on HD he can always stop it and be made comfort care. He feels this is reasonable.  -  mildly uremic with anorexia and mild nausea, does not need HD acutely- crt stable from yesterday to today. AVG is 3 wks old, needs 4 weeks to use. OK for dc home, patient will see Dr Posey Pronto - has appt on Wed 2/5 and he can complete the CLIP process which we have started today. Patient should be able to start outpt HD in the next 1-2 weeks maximum. - Continue NaHCO3 650mg  BID  2. HTN - better controlled after restarting home regimen- on many meds  3. Vol excess/ CHF - off of nasal O2 today, diuresed 2.5 L here over 2 days. Would dc home on lasix 40 mg bid and fluid restriction 1200 cc/ d.   4. Anemia - Hb 10.5- 11.5 , no need esa at this time 5. DM 6. H/o sarcoidosis 7. Nausea - please send home w/ a Rx for antiemetics he can use prn until HD is up and started  Plan is in place as above.... renal will sign off- no longer see pt as inpatient but call if questions  Louis Meckel    Subjective:   Says feels OK- no nausea    Objective:   BP 132/60   Pulse 62   Temp 98.4 F (36.9 C) (Oral)   Resp 16   Wt 76.6 kg   SpO2 99%   BMI 26.45 kg/m   Intake/Output Summary (Last 24  hours) at 11/12/2018 0902 Last data filed at 11/11/2018 2224 Gross per 24 hour  Intake 3 ml  Output 700 ml  Net -697 ml   Weight change:   Physical Exam: GEN: NAD, NCAT, pleasant HEENT: No conjunctival pallor, EOMI NECK: Supple, no thyromegaly LUNGS: CTA B/L no rales, rhonchi or wheezing CV: RRR, No M/R/G ABD: SNDNT +BS  EXT: Tr  edema   Imaging: No results found.  Labs: BMET Recent Labs  Lab 11/08/18 2236 11/09/18 2001 11/10/18 1223 11/11/18 0934 11/12/18 0626  NA 136  --  137 138 137  K 4.5  --  3.9 4.2 4.0  CL 105  --  104 105 103  CO2 19*  --  22 23 23   GLUCOSE 170*  --  143* 186* 113*  BUN 35*  --  41* 45* 44*  CREATININE 4.96*  --  5.76* 6.41* 6.44*  CALCIUM 9.2  --  8.5* 8.5* 8.6*  PHOS  --  4.4 4.1 4.1 4.3   CBC Recent Labs  Lab 11/08/18 2236 11/11/18 0934  WBC 14.3* 8.2  HGB 11.7* 10.6*  HCT 35.9* 32.5*  MCV 99.2  97.3  PLT 260 252    Medications:    . amiodarone  200 mg Oral Daily  . amLODipine  10 mg Oral Daily  . aspirin EC  81 mg Oral Daily  . cloNIDine  0.1 mg Oral BID  . enoxaparin (LOVENOX) injection  30 mg Subcutaneous Q24H  . furosemide  40 mg Oral BID  . Glycerin (Adult)  1 suppository Rectal Once  . hydrALAZINE  100 mg Oral TID  . Melatonin  9 mg Oral QHS  . metoprolol tartrate  150 mg Oral BID  . pantoprazole  20 mg Oral Daily  . polyethylene glycol  17 g Oral BID  . rosuvastatin  10 mg Oral q1800  . senna-docusate  1 tablet Oral BID  . sodium bicarbonate  650 mg Oral BID  . sodium chloride flush  3 mL Intravenous Once  . sodium chloride flush  3 mL Intravenous Q12H

## 2018-11-12 NOTE — Plan of Care (Signed)
  Problem: Education: Goal: Knowledge of General Education information will improve Description Including pain rating scale, medication(s)/side effects and non-pharmacologic comfort measures Outcome: Progressing   Problem: Health Behavior/Discharge Planning: Goal: Ability to manage health-related needs will improve Outcome: Progressing   Problem: Nutrition: Goal: Adequate nutrition will be maintained Outcome: Progressing   Problem: Elimination: Goal: Will not experience complications related to bowel motility Outcome: Progressing Goal: Will not experience complications related to urinary retention Outcome: Progressing   Problem: Elimination: Goal: Will not experience complications related to urinary retention Outcome: Progressing   Problem: Education: Goal: Ability to demonstrate management of disease process will improve Outcome: Progressing Goal: Ability to verbalize understanding of medication therapies will improve Outcome: Progressing Goal: Individualized Educational Video(s) Outcome: Progressing   Problem: Activity: Goal: Capacity to carry out activities will improve Outcome: Progressing   Problem: Cardiac: Goal: Ability to achieve and maintain adequate cardiopulmonary perfusion will improve Outcome: Progressing

## 2018-11-13 LAB — RENAL FUNCTION PANEL
Albumin: 2.5 g/dL — ABNORMAL LOW (ref 3.5–5.0)
Anion gap: 10 (ref 5–15)
BUN: 47 mg/dL — ABNORMAL HIGH (ref 8–23)
CO2: 24 mmol/L (ref 22–32)
Calcium: 8.4 mg/dL — ABNORMAL LOW (ref 8.9–10.3)
Chloride: 102 mmol/L (ref 98–111)
Creatinine, Ser: 6.6 mg/dL — ABNORMAL HIGH (ref 0.61–1.24)
GFR calc Af Amer: 9 mL/min — ABNORMAL LOW (ref >60)
GFR calc non Af Amer: 7 mL/min — ABNORMAL LOW (ref >60)
Glucose, Bld: 115 mg/dL — ABNORMAL HIGH (ref 70–99)
Phosphorus: 4.4 mg/dL (ref 2.5–4.6)
Potassium: 4 mmol/L (ref 3.5–5.1)
Sodium: 136 mmol/L (ref 135–145)

## 2018-11-13 MED ORDER — SENNOSIDES-DOCUSATE SODIUM 8.6-50 MG PO TABS
2.0000 | ORAL_TABLET | Freq: Two times a day (BID) | ORAL | Status: DC
  Administered 2018-11-13 – 2018-11-15 (×3): 2 via ORAL
  Filled 2018-11-13 (×4): qty 2

## 2018-11-13 MED ORDER — GLYCERIN (LAXATIVE) 2.1 G RE SUPP
1.0000 | Freq: Once | RECTAL | Status: AC
  Administered 2018-11-13: 1 via RECTAL
  Filled 2018-11-13: qty 1

## 2018-11-13 NOTE — Progress Notes (Signed)
Family Medicine Teaching Service Daily Progress Note Intern Pager: [phone]  Patient name: William [name] Medical record number: [id number] Date of birth: 1940-12-25 Age: 78 y.o. Gender: male  Primary Care Provider: [name], DO Consultants: Nephro Code Status: DNR  Pt Overview and Major Events to Date:  1/29: admitted to [place], V/Q scan neg for PE, CT stone neg 1/31: weaned to room air, Safe for discharge per nephro, work up for SNF placement/HH initiated  Assessment and Plan: William [name] is a 78 y.o. male presenting with generalized fatigue, hypoxia 2/2 CHF exacerbation. PMH is significant for CKDIII, HTN, T2DM, CAD s/p PCI, HLD, complete heart block with pacemaker, amyloidosis   HFpEF exacerbation, improved.  Continues to remain stable on room air with normal WOB.  Appears euvolemic. UOP 0.85L out in last 24h. Weight slightly up this AM 180>168>171. 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 Kidney function stable, BUN 40's. No nausea this morning. - Per nephro, stable for DC home with Rx for antiemetics. Setup for outpt f/u and to complete CLIP process - continue sodium bicarb 650mg  BID - prn zofran   CAD s/p PCI, complete heart block with pacemaker. Stable. Home meds: amiodorone 200mg  daily, ASA 81mg  daily, rosuvastatin 10mg  daily - continue home meds  HTN, stable.home meds: amlodipine 10mg  daily, clonidine 0.1mg  BID, hydralazine 100mg  TID, lopressor 150mg  BID - continue home meds  T2DM- diet controlled, stable  - continue glucose monitoring on renal function panel  Insomnia:  - Melatonin qHS  - Consider trazodone x 1 if persists this evening  Goals of Care: - Per palliative, patient wishes to trial HD and MOST form completed. May benefit from outpatient palliative referral. - Care management and CSW consulted for SNF vs H/H placement,  will follow up.   Constipation, last BM 1/28, did no receive glycerin suppository yesterday. Adbomen soft but decreased bowel sounds on exam. - mirlalax BID - increase senakot to 2 po BID - Glycerin suppository x1  FEN/GI: heart healthy, carb modified diet, protonix  PPx:  lovenox   Disposition: Pending SNF placement vs Home with H/H. Per daughter [name] [facility]), if insurance is barrier to SNF placement she has opportunity to change enrollment until Feb 15.    Subjective:  Patient states he feels well this morning. Denies any SOB or trouble breathing with ambulation. Still denies having a bowel movement and did not receive suppository yesterday, however denies any nausea, vomiting, or abdominal pain. He also notes he did not sleep overnight and labs coming in at 4am is irritating.   Objective: Temp:  [97.6 F (36.4 C)-98.3 F (36.8 C)] 98.3 F (36.8 C) (02/02 0449) Pulse Rate:  [60-66] 66 (02/02 0449) Resp:  [18] 18 (02/01 2227) BP: (117-138)/(52-57) 138/55 (02/02 0449) SpO2:  [97 %-100 %] 97 % (02/02 0449) Weight:  [77.7 kg] 77.7 kg (02/02 0449) Physical Exam: General: well nourished, well developed, in no acute distress with non-toxic appearance, brushing teeth initiall, walked well to bed and sitting up comfortably HEENT: normocephalic, atraumatic, moist mucous membranes Neck: supple, normal ROM CV: regular rate and rhythm without murmurs, rubs, or gallops, no lower extremity edema Lungs: clear to auscultation bilaterally with normal work of breathing on room air Abdomen: soft, non-tender, non-distended, bowel sounds difficult to appreciate Skin: warm, dry, no rashes or lesions Extremities: warm and well perfused, normal tone MSK: ROM  grossly intact, gait normal Neuro: Alert and oriented, speech normal  Laboratory: Recent Labs  Lab 11/08/18 2236 11/11/18 0934  WBC 14.3* 8.2  HGB 11.7* 10.6*  HCT 35.9* 32.5*  PLT 260 252   Recent Labs  Lab 11/11/18 0934  11/12/18 0626 11/13/18 0559  NA 138 137 136  K 4.2 4.0 4.0  CL 105 103 102  CO2 23 23 24   BUN 45* 44* 47*  CREATININE 6.41* 6.44* 6.60*  CALCIUM 8.5* 8.6* 8.4*  GLUCOSE 186* 113* 115*     Imaging/Diagnostic Tests: No results found.  Danna Hefty, DO 11/13/2018, 9:57 AM PGY-1 [place] Intern pager: [phone], text pages welcome

## 2018-11-14 ENCOUNTER — Other Ambulatory Visit: Payer: Medicare HMO

## 2018-11-14 ENCOUNTER — Encounter: Payer: Medicare Other | Admitting: Internal Medicine

## 2018-11-14 LAB — CBC
HCT: 33 % — ABNORMAL LOW (ref 39.0–52.0)
Hemoglobin: 11.1 g/dL — ABNORMAL LOW (ref 13.0–17.0)
MCH: 32.4 pg (ref 26.0–34.0)
MCHC: 33.6 g/dL (ref 30.0–36.0)
MCV: 96.2 fL (ref 80.0–100.0)
Platelets: 320 10*3/uL (ref 150–400)
RBC: 3.43 MIL/uL — ABNORMAL LOW (ref 4.22–5.81)
RDW: 12.5 % (ref 11.5–15.5)
WBC: 6.1 10*3/uL (ref 4.0–10.5)
nRBC: 0 % (ref 0.0–0.2)

## 2018-11-14 LAB — RENAL FUNCTION PANEL
Albumin: 2.8 g/dL — ABNORMAL LOW (ref 3.5–5.0)
Anion gap: 13 (ref 5–15)
BUN: 53 mg/dL — ABNORMAL HIGH (ref 8–23)
CO2: 23 mmol/L (ref 22–32)
Calcium: 8.7 mg/dL — ABNORMAL LOW (ref 8.9–10.3)
Chloride: 101 mmol/L (ref 98–111)
Creatinine, Ser: 6.74 mg/dL — ABNORMAL HIGH (ref 0.61–1.24)
GFR calc Af Amer: 8 mL/min — ABNORMAL LOW (ref >60)
GFR calc non Af Amer: 7 mL/min — ABNORMAL LOW (ref >60)
Glucose, Bld: 117 mg/dL — ABNORMAL HIGH (ref 70–99)
Phosphorus: 4.8 mg/dL — ABNORMAL HIGH (ref 2.5–4.6)
Potassium: 4.1 mmol/L (ref 3.5–5.1)
Sodium: 137 mmol/L (ref 135–145)

## 2018-11-14 MED ORDER — LIVING BETTER WITH HEART FAILURE BOOK
Freq: Once | Status: AC
  Administered 2018-11-14: 15:00:00

## 2018-11-14 NOTE — Progress Notes (Signed)
Notified night md that pt's bp was low 101/45 and lasix and hydralazine were held at 6pm.

## 2018-11-14 NOTE — Progress Notes (Signed)
[place] still awaiting insurance approval.   Cedric Fishman LCSW [phone]

## 2018-11-14 NOTE — Progress Notes (Signed)
Family Medicine Teaching Service Daily Progress Note Intern Pager: [phone]  Patient name: William [name] Medical record number: [id number] Date of birth: 15-Feb-1941 Age: 78 y.o. Gender: male  Primary Care Provider: [name], DO Consultants: Nephro Code Status: DNR  Pt Overview and Major Events to Date:  1/29: admitted to [facility], V/Q scan neg for PE, CT stone neg 1/31: weaned to room air, Safe for discharge per nephro, work up for SNF placement/HH initiated  Assessment and Plan: William [name] is a 78 y.o. male presenting with generalized fatigue, hypoxia 2/2 CHF exacerbation. PMH is significant for CKDIII, HTN, T2DM, CAD s/p PCI, HLD, complete heart block with pacemaker, amyloidosis   HFpEF exacerbation, improved, stable  - Continue lasix 40mg  BID - daily wts, strict I/O's, cardiac monitoring, pulse ox  - incentive spirometry   CKD V: stable - continue sodium bicarb 650mg  BID - prn zofran   CAD s/p PCI, complete heart block with pacemaker. Stable. Home meds: amiodorone 200mg  daily, ASA 81mg  daily, rosuvastatin 10mg  daily - continue home meds  HTN, stable.home meds: amlodipine 10mg  daily, clonidine 0.1mg  BID, hydralazine 100mg  TID, lopressor 150mg  BID - continue home meds  T2DM- diet controlled, stable  - continue glucose monitoring on renal function panel  Insomnia:  - Melatonin qHS  - Consider trazodone x 1 if persists this evening  Goals of Care: - Care management and CSW consulted for SNF vs H/H placement, will follow up.   FEN/GI: heart healthy, carb modified diet, protonix  PPx:  lovenox   Disposition: Pending SNF placement vs Home with H/H   Subjective:  No complaints or concerns this morning. Slept well overnight.   Objective: Temp:  [97.7 F (36.5 C)-98.4 F (36.9 C)] 97.7 F (36.5 C) (02/03 0630) Pulse Rate:  [60-66] 66 (02/03 0632) Resp:  [18] 18 (02/03 1601) BP: (122-136)/(52-68) 129/54 (02/03 0932) SpO2:  [97 %-99 %] 99 % (02/03  3557) Weight:  [75.3 kg] 75.3 kg (02/03 3220) Physical Exam: General: well nourished, well developed, in no acute distress with non-toxic appearance, lying comfortably in bed  CV: regular rate and rhythm without murmurs, rubs, or gallops, no lower extremity edema, 2+ radial and pedal pulses Lungs: clear to auscultation bilaterally with normal work of breathing on room air Extremities: warm and well perfused, normal tone Neuro: Alert and oriented, speech normal  Laboratory: Recent Labs  Lab 11/08/18 2236 11/11/18 0934 11/14/18 0644  WBC 14.3* 8.2 6.1  HGB 11.7* 10.6* 11.1*  HCT 35.9* 32.5* 33.0*  PLT 260 252 320   Recent Labs  Lab 11/12/18 0626 11/13/18 0559 11/14/18 0644  NA 137 136 137  K 4.0 4.0 4.1  CL 103 102 101  CO2 23 24 23   BUN 44* 47* 53*  CREATININE 6.44* 6.60* 6.74*  CALCIUM 8.6* 8.4* 8.7*  GLUCOSE 113* 115* 117*     Imaging/Diagnostic Tests: No results found.  William Marble Stanley, DO 11/14/2018, 3:02 PM PGY-1 [facility] Intern pager: [phone], text pages welcome
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
## 2018-11-15 DIAGNOSIS — R1111 Vomiting without nausea: Secondary | ICD-10-CM | POA: Diagnosis not present

## 2018-11-15 DIAGNOSIS — I10 Essential (primary) hypertension: Secondary | ICD-10-CM | POA: Diagnosis not present

## 2018-11-15 DIAGNOSIS — I5032 Chronic diastolic (congestive) heart failure: Secondary | ICD-10-CM | POA: Diagnosis not present

## 2018-11-15 DIAGNOSIS — E1122 Type 2 diabetes mellitus with diabetic chronic kidney disease: Secondary | ICD-10-CM | POA: Diagnosis not present

## 2018-11-15 DIAGNOSIS — R41841 Cognitive communication deficit: Secondary | ICD-10-CM | POA: Diagnosis not present

## 2018-11-15 DIAGNOSIS — R4189 Other symptoms and signs involving cognitive functions and awareness: Secondary | ICD-10-CM | POA: Diagnosis present

## 2018-11-15 DIAGNOSIS — Z992 Dependence on renal dialysis: Secondary | ICD-10-CM | POA: Diagnosis not present

## 2018-11-15 DIAGNOSIS — N19 Unspecified kidney failure: Secondary | ICD-10-CM | POA: Diagnosis not present

## 2018-11-15 DIAGNOSIS — Z515 Encounter for palliative care: Secondary | ICD-10-CM | POA: Diagnosis not present

## 2018-11-15 DIAGNOSIS — R278 Other lack of coordination: Secondary | ICD-10-CM | POA: Diagnosis not present

## 2018-11-15 DIAGNOSIS — R531 Weakness: Secondary | ICD-10-CM | POA: Diagnosis not present

## 2018-11-15 DIAGNOSIS — I120 Hypertensive chronic kidney disease with stage 5 chronic kidney disease or end stage renal disease: Secondary | ICD-10-CM | POA: Diagnosis not present

## 2018-11-15 DIAGNOSIS — I132 Hypertensive heart and chronic kidney disease with heart failure and with stage 5 chronic kidney disease, or end stage renal disease: Secondary | ICD-10-CM | POA: Diagnosis not present

## 2018-11-15 DIAGNOSIS — R110 Nausea: Secondary | ICD-10-CM | POA: Diagnosis not present

## 2018-11-15 DIAGNOSIS — M6281 Muscle weakness (generalized): Secondary | ICD-10-CM | POA: Diagnosis not present

## 2018-11-15 DIAGNOSIS — E1129 Type 2 diabetes mellitus with other diabetic kidney complication: Secondary | ICD-10-CM | POA: Diagnosis not present

## 2018-11-15 DIAGNOSIS — R5383 Other fatigue: Secondary | ICD-10-CM | POA: Diagnosis not present

## 2018-11-15 DIAGNOSIS — E8589 Other amyloidosis: Secondary | ICD-10-CM | POA: Diagnosis not present

## 2018-11-15 DIAGNOSIS — N2581 Secondary hyperparathyroidism of renal origin: Secondary | ICD-10-CM | POA: Diagnosis not present

## 2018-11-15 DIAGNOSIS — E440 Moderate protein-calorie malnutrition: Secondary | ICD-10-CM | POA: Diagnosis not present

## 2018-11-15 DIAGNOSIS — I5033 Acute on chronic diastolic (congestive) heart failure: Secondary | ICD-10-CM | POA: Diagnosis not present

## 2018-11-15 DIAGNOSIS — F0390 Unspecified dementia without behavioral disturbance: Secondary | ICD-10-CM | POA: Diagnosis present

## 2018-11-15 DIAGNOSIS — E785 Hyperlipidemia, unspecified: Secondary | ICD-10-CM | POA: Diagnosis present

## 2018-11-15 DIAGNOSIS — I350 Nonrheumatic aortic (valve) stenosis: Secondary | ICD-10-CM | POA: Diagnosis present

## 2018-11-15 DIAGNOSIS — I442 Atrioventricular block, complete: Secondary | ICD-10-CM | POA: Diagnosis not present

## 2018-11-15 DIAGNOSIS — I770 Arteriovenous fistula, acquired: Secondary | ICD-10-CM | POA: Diagnosis not present

## 2018-11-15 DIAGNOSIS — Z888 Allergy status to other drugs, medicaments and biological substances status: Secondary | ICD-10-CM | POA: Diagnosis not present

## 2018-11-15 DIAGNOSIS — M2550 Pain in unspecified joint: Secondary | ICD-10-CM | POA: Diagnosis not present

## 2018-11-15 DIAGNOSIS — R2689 Other abnormalities of gait and mobility: Secondary | ICD-10-CM | POA: Diagnosis not present

## 2018-11-15 DIAGNOSIS — R5381 Other malaise: Secondary | ICD-10-CM | POA: Diagnosis not present

## 2018-11-15 DIAGNOSIS — Z66 Do not resuscitate: Secondary | ICD-10-CM | POA: Diagnosis not present

## 2018-11-15 DIAGNOSIS — I2510 Atherosclerotic heart disease of native coronary artery without angina pectoris: Secondary | ICD-10-CM | POA: Diagnosis present

## 2018-11-15 DIAGNOSIS — I472 Ventricular tachycardia: Secondary | ICD-10-CM | POA: Diagnosis not present

## 2018-11-15 DIAGNOSIS — R112 Nausea with vomiting, unspecified: Secondary | ICD-10-CM | POA: Diagnosis present

## 2018-11-15 DIAGNOSIS — Z7189 Other specified counseling: Secondary | ICD-10-CM | POA: Diagnosis not present

## 2018-11-15 DIAGNOSIS — N281 Cyst of kidney, acquired: Secondary | ICD-10-CM | POA: Diagnosis not present

## 2018-11-15 DIAGNOSIS — Z6824 Body mass index (BMI) 24.0-24.9, adult: Secondary | ICD-10-CM | POA: Diagnosis not present

## 2018-11-15 DIAGNOSIS — R0902 Hypoxemia: Secondary | ICD-10-CM | POA: Diagnosis not present

## 2018-11-15 DIAGNOSIS — I5031 Acute diastolic (congestive) heart failure: Secondary | ICD-10-CM | POA: Diagnosis not present

## 2018-11-15 DIAGNOSIS — I700 Atherosclerosis of aorta: Secondary | ICD-10-CM | POA: Diagnosis not present

## 2018-11-15 DIAGNOSIS — L8990 Pressure ulcer of unspecified site, unspecified stage: Secondary | ICD-10-CM | POA: Diagnosis not present

## 2018-11-15 DIAGNOSIS — E119 Type 2 diabetes mellitus without complications: Secondary | ICD-10-CM | POA: Diagnosis not present

## 2018-11-15 DIAGNOSIS — D631 Anemia in chronic kidney disease: Secondary | ICD-10-CM | POA: Diagnosis not present

## 2018-11-15 DIAGNOSIS — Z7401 Bed confinement status: Secondary | ICD-10-CM | POA: Diagnosis not present

## 2018-11-15 DIAGNOSIS — N186 End stage renal disease: Secondary | ICD-10-CM | POA: Diagnosis not present

## 2018-11-15 DIAGNOSIS — R0989 Other specified symptoms and signs involving the circulatory and respiratory systems: Secondary | ICD-10-CM | POA: Diagnosis not present

## 2018-11-15 DIAGNOSIS — N185 Chronic kidney disease, stage 5: Secondary | ICD-10-CM | POA: Diagnosis not present

## 2018-11-15 DIAGNOSIS — Z950 Presence of cardiac pacemaker: Secondary | ICD-10-CM | POA: Diagnosis not present

## 2018-11-15 DIAGNOSIS — Z79899 Other long term (current) drug therapy: Secondary | ICD-10-CM | POA: Diagnosis not present

## 2018-11-15 DIAGNOSIS — N184 Chronic kidney disease, stage 4 (severe): Secondary | ICD-10-CM | POA: Diagnosis not present

## 2018-11-15 DIAGNOSIS — R69 Illness, unspecified: Secondary | ICD-10-CM | POA: Diagnosis not present

## 2018-11-15 DIAGNOSIS — N179 Acute kidney failure, unspecified: Secondary | ICD-10-CM | POA: Diagnosis not present

## 2018-11-15 DIAGNOSIS — L89152 Pressure ulcer of sacral region, stage 2: Secondary | ICD-10-CM | POA: Diagnosis not present

## 2018-11-15 LAB — GLUCOSE, CAPILLARY
Glucose-Capillary: 97 mg/dL (ref 70–99)
Glucose-Capillary: 163 mg/dL — ABNORMAL HIGH (ref 70–99)

## 2018-11-15 MED ORDER — FUROSEMIDE 40 MG PO TABS: 40.0000 mg | ORAL_TABLET | Freq: Two times a day (BID) | ORAL | 0 refills | Status: DC

## 2018-11-15 NOTE — Clinical Social Work Placement (Signed)
   CLINICAL SOCIAL WORK PLACEMENT  NOTE  Date:  11/15/2018  Patient Details  Name: William [name] MRN: [id number] Date of Birth: 09-23-1941  Clinical Social Work is seeking post-discharge placement for this patient at the [facility] level of care (*CSW will initial, date and re-position this form in  chart as items are completed):  Yes   Patient/family provided with [facility] Work Department's list of facilities offering this level of care within the geographic area requested by the patient (or if unable, by the patient's family).  Yes   Patient/family informed of their freedom to choose among providers that offer the needed level of care, that participate in Medicare, Medicaid or managed care program needed by the patient, have an available bed and are willing to accept the patient.  Yes   Patient/family informed of [facility]'s ownership interest in [facility] and [facility], as well as of the fact that they are under no obligation to receive care at these facilities.  PASRR submitted to EDS on       PASRR number received on       Existing PASRR number confirmed on 11/11/18     FL2 transmitted to all facilities in geographic area requested by pt/family on 11/11/18     FL2 transmitted to all facilities within larger geographic area on       Patient informed that his/her managed care company has contracts with or will negotiate with certain facilities, including the following:        Yes   Patient/family informed of bed offers received.  Patient chooses bed at [facility]     Physician recommends and patient chooses bed at      Patient to be transferred to [facility] on 11/15/18.  Patient to be transferred to facility by [facility]     Patient family notified on 11/15/18 of transfer.  Name of family member notified:  Daughter, Benita     PHYSICIAN       Additional Comment:     _______________________________________________ Benard Halsted, LCSW 11/15/2018, 1:39 PM
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
## 2018-11-15 NOTE — Progress Notes (Signed)
Patient will DC to: [facility] Anticipated DC date: 11/15/2018 Family notified: Daughter, Leisure centre manager by: Corey Harold    Per MD patient ready for DC to [facility]. RN, patient, patient's family, and facility notified of DC. Discharge Summary and FL2 sent to facility. RN to call report prior to discharge [phone] Room 1201P). DC packet on chart. Ambulance transport requested for patient.   CSW will sign off for now as social work intervention is no longer needed. Please consult [place] again if new needs arise.  Cedric Fishman, LCSW Clinical Social Worker [phone]

## 2018-11-15 NOTE — Progress Notes (Signed)
Patient discharged to [facility] via ambulance. Report given to Izell [place], RN. All questions answered.

## 2018-11-15 NOTE — Care Management Important Message (Signed)
Important Message  Patient Details  Name: William [name] MRN: [id number] Date of Birth: 1941-10-03   Medicare Important Message Given:  Yes    [name] 11/15/2018, 3:47 PM

## 2018-11-15 NOTE — Progress Notes (Signed)
[facility] has Biochemist, clinical for patient.   Percell Locus Karesa Maultsby LCSW [phone]

## 2018-11-15 NOTE — Plan of Care (Signed)
  Problem: Education: Goal: Knowledge of General Education information will improve Description Including pain rating scale, medication(s)/side effects and non-pharmacologic comfort measures Outcome: Adequate for Discharge   Problem: Health Behavior/Discharge Planning: Goal: Ability to manage health-related needs will improve Outcome: Adequate for Discharge   Problem: Clinical Measurements: Goal: Ability to maintain clinical measurements within normal limits will improve Outcome: Adequate for Discharge Goal: Diagnostic test results will improve Outcome: Adequate for Discharge Goal: Respiratory complications will improve Outcome: Adequate for Discharge Goal: Cardiovascular complication will be avoided Outcome: Adequate for Discharge   Problem: Nutrition: Goal: Adequate nutrition will be maintained Outcome: Adequate for Discharge   Problem: Elimination: Goal: Will not experience complications related to bowel motility Outcome: Adequate for Discharge Goal: Will not experience complications related to urinary retention Outcome: Adequate for Discharge   Problem: Education: Goal: Ability to demonstrate management of disease process will improve Outcome: Adequate for Discharge Goal: Ability to verbalize understanding of medication therapies will improve Outcome: Adequate for Discharge Goal: Individualized Educational Video(s) Outcome: Adequate for Discharge   Problem: Activity: Goal: Capacity to carry out activities will improve Outcome: Adequate for Discharge   Problem: Cardiac: Goal: Ability to achieve and maintain adequate cardiopulmonary perfusion will improve Outcome: Adequate for Discharge

## 2018-11-16 DIAGNOSIS — D631 Anemia in chronic kidney disease: Secondary | ICD-10-CM | POA: Diagnosis not present

## 2018-11-16 DIAGNOSIS — N185 Chronic kidney disease, stage 5: Secondary | ICD-10-CM | POA: Diagnosis not present

## 2018-11-16 DIAGNOSIS — R0902 Hypoxemia: Secondary | ICD-10-CM | POA: Diagnosis not present

## 2018-11-16 DIAGNOSIS — E119 Type 2 diabetes mellitus without complications: Secondary | ICD-10-CM | POA: Diagnosis not present

## 2018-11-16 DIAGNOSIS — I5033 Acute on chronic diastolic (congestive) heart failure: Secondary | ICD-10-CM | POA: Diagnosis not present

## 2018-11-16 DIAGNOSIS — N2581 Secondary hyperparathyroidism of renal origin: Secondary | ICD-10-CM | POA: Diagnosis not present

## 2018-11-16 DIAGNOSIS — I120 Hypertensive chronic kidney disease with stage 5 chronic kidney disease or end stage renal disease: Secondary | ICD-10-CM | POA: Diagnosis not present

## 2018-11-17 DIAGNOSIS — I442 Atrioventricular block, complete: Secondary | ICD-10-CM | POA: Diagnosis not present

## 2018-11-17 DIAGNOSIS — R0989 Other specified symptoms and signs involving the circulatory and respiratory systems: Secondary | ICD-10-CM | POA: Diagnosis not present

## 2018-11-17 DIAGNOSIS — I700 Atherosclerosis of aorta: Secondary | ICD-10-CM | POA: Diagnosis not present

## 2018-11-17 DIAGNOSIS — E8589 Other amyloidosis: Secondary | ICD-10-CM | POA: Diagnosis not present

## 2018-11-17 DIAGNOSIS — E1129 Type 2 diabetes mellitus with other diabetic kidney complication: Secondary | ICD-10-CM | POA: Diagnosis not present

## 2018-11-17 DIAGNOSIS — N281 Cyst of kidney, acquired: Secondary | ICD-10-CM | POA: Diagnosis not present

## 2018-11-17 DIAGNOSIS — I5031 Acute diastolic (congestive) heart failure: Secondary | ICD-10-CM | POA: Diagnosis not present

## 2018-11-17 DIAGNOSIS — I472 Ventricular tachycardia: Secondary | ICD-10-CM | POA: Diagnosis not present

## 2018-11-17 DIAGNOSIS — N185 Chronic kidney disease, stage 5: Secondary | ICD-10-CM | POA: Diagnosis not present

## 2018-11-17 DIAGNOSIS — I770 Arteriovenous fistula, acquired: Secondary | ICD-10-CM | POA: Diagnosis not present

## 2018-11-18 ENCOUNTER — Emergency Department (HOSPITAL_COMMUNITY): Payer: Medicare HMO

## 2018-11-18 ENCOUNTER — Inpatient Hospital Stay (HOSPITAL_COMMUNITY)
Admission: EM | Admit: 2018-11-18 | Discharge: 2018-11-24 | DRG: 291 | Disposition: A | Discharge status: Skilled Nursing Facility | Payer: Medicare HMO | Attending: Family Medicine | Admitting: Family Medicine

## 2018-11-18 ENCOUNTER — Encounter (HOSPITAL_COMMUNITY): Payer: Self-pay | Admitting: Emergency Medicine

## 2018-11-18 ENCOUNTER — Other Ambulatory Visit: Payer: Self-pay

## 2018-11-18 DIAGNOSIS — D631 Anemia in chronic kidney disease: Secondary | ICD-10-CM | POA: Insufficient documentation

## 2018-11-18 DIAGNOSIS — Z79899 Other long term (current) drug therapy: Secondary | ICD-10-CM | POA: Diagnosis not present

## 2018-11-18 DIAGNOSIS — R5383 Other fatigue: Secondary | ICD-10-CM

## 2018-11-18 DIAGNOSIS — Z992 Dependence on renal dialysis: Secondary | ICD-10-CM | POA: Diagnosis present

## 2018-11-18 DIAGNOSIS — F0390 Unspecified dementia without behavioral disturbance: Secondary | ICD-10-CM | POA: Diagnosis present

## 2018-11-18 DIAGNOSIS — I442 Atrioventricular block, complete: Secondary | ICD-10-CM | POA: Diagnosis not present

## 2018-11-18 DIAGNOSIS — L89152 Pressure ulcer of sacral region, stage 2: Secondary | ICD-10-CM | POA: Diagnosis not present

## 2018-11-18 DIAGNOSIS — R531 Weakness: Secondary | ICD-10-CM | POA: Diagnosis not present

## 2018-11-18 DIAGNOSIS — R278 Other lack of coordination: Secondary | ICD-10-CM | POA: Diagnosis not present

## 2018-11-18 DIAGNOSIS — I5022 Chronic systolic (congestive) heart failure: Secondary | ICD-10-CM

## 2018-11-18 DIAGNOSIS — R509 Fever, unspecified: Secondary | ICD-10-CM | POA: Insufficient documentation

## 2018-11-18 DIAGNOSIS — N281 Cyst of kidney, acquired: Secondary | ICD-10-CM | POA: Diagnosis present

## 2018-11-18 DIAGNOSIS — Z6824 Body mass index (BMI) 24.0-24.9, adult: Secondary | ICD-10-CM | POA: Diagnosis not present

## 2018-11-18 DIAGNOSIS — I350 Nonrheumatic aortic (valve) stenosis: Secondary | ICD-10-CM | POA: Diagnosis present

## 2018-11-18 DIAGNOSIS — R4189 Other symptoms and signs involving cognitive functions and awareness: Secondary | ICD-10-CM | POA: Diagnosis present

## 2018-11-18 DIAGNOSIS — L8990 Pressure ulcer of unspecified site, unspecified stage: Secondary | ICD-10-CM | POA: Diagnosis present

## 2018-11-18 DIAGNOSIS — Z743 Need for continuous supervision: Secondary | ICD-10-CM | POA: Diagnosis not present

## 2018-11-18 DIAGNOSIS — N179 Acute kidney failure, unspecified: Secondary | ICD-10-CM | POA: Diagnosis not present

## 2018-11-18 DIAGNOSIS — E1151 Type 2 diabetes mellitus with diabetic peripheral angiopathy without gangrene: Secondary | ICD-10-CM

## 2018-11-18 DIAGNOSIS — N189 Chronic kidney disease, unspecified: Secondary | ICD-10-CM | POA: Insufficient documentation

## 2018-11-18 DIAGNOSIS — D688 Other specified coagulation defects: Secondary | ICD-10-CM

## 2018-11-18 DIAGNOSIS — R2689 Other abnormalities of gait and mobility: Secondary | ICD-10-CM | POA: Diagnosis not present

## 2018-11-18 DIAGNOSIS — N186 End stage renal disease: Secondary | ICD-10-CM | POA: Diagnosis not present

## 2018-11-18 DIAGNOSIS — E1122 Type 2 diabetes mellitus with diabetic chronic kidney disease: Secondary | ICD-10-CM | POA: Diagnosis present

## 2018-11-18 DIAGNOSIS — I132 Hypertensive heart and chronic kidney disease with heart failure and with stage 5 chronic kidney disease, or end stage renal disease: Principal | ICD-10-CM | POA: Diagnosis present

## 2018-11-18 DIAGNOSIS — I5033 Acute on chronic diastolic (congestive) heart failure: Secondary | ICD-10-CM | POA: Diagnosis not present

## 2018-11-18 DIAGNOSIS — I10 Essential (primary) hypertension: Secondary | ICD-10-CM | POA: Diagnosis not present

## 2018-11-18 DIAGNOSIS — Z66 Do not resuscitate: Secondary | ICD-10-CM | POA: Diagnosis not present

## 2018-11-18 DIAGNOSIS — I5032 Chronic diastolic (congestive) heart failure: Secondary | ICD-10-CM | POA: Diagnosis not present

## 2018-11-18 DIAGNOSIS — R41841 Cognitive communication deficit: Secondary | ICD-10-CM | POA: Diagnosis not present

## 2018-11-18 DIAGNOSIS — N2581 Secondary hyperparathyroidism of renal origin: Secondary | ICD-10-CM

## 2018-11-18 DIAGNOSIS — R0602 Shortness of breath: Secondary | ICD-10-CM | POA: Insufficient documentation

## 2018-11-18 DIAGNOSIS — M6281 Muscle weakness (generalized): Secondary | ICD-10-CM | POA: Diagnosis not present

## 2018-11-18 DIAGNOSIS — D869 Sarcoidosis, unspecified: Secondary | ICD-10-CM | POA: Insufficient documentation

## 2018-11-18 DIAGNOSIS — Z888 Allergy status to other drugs, medicaments and biological substances status: Secondary | ICD-10-CM | POA: Diagnosis not present

## 2018-11-18 DIAGNOSIS — N185 Chronic kidney disease, stage 5: Secondary | ICD-10-CM

## 2018-11-18 DIAGNOSIS — Z515 Encounter for palliative care: Secondary | ICD-10-CM | POA: Diagnosis not present

## 2018-11-18 DIAGNOSIS — E440 Moderate protein-calorie malnutrition: Secondary | ICD-10-CM | POA: Diagnosis present

## 2018-11-18 DIAGNOSIS — I2510 Atherosclerotic heart disease of native coronary artery without angina pectoris: Secondary | ICD-10-CM | POA: Diagnosis present

## 2018-11-18 DIAGNOSIS — R112 Nausea with vomiting, unspecified: Secondary | ICD-10-CM | POA: Diagnosis not present

## 2018-11-18 DIAGNOSIS — R110 Nausea: Secondary | ICD-10-CM

## 2018-11-18 DIAGNOSIS — R69 Illness, unspecified: Secondary | ICD-10-CM | POA: Diagnosis not present

## 2018-11-18 DIAGNOSIS — R1111 Vomiting without nausea: Secondary | ICD-10-CM | POA: Diagnosis not present

## 2018-11-18 DIAGNOSIS — N19 Unspecified kidney failure: Secondary | ICD-10-CM | POA: Diagnosis not present

## 2018-11-18 DIAGNOSIS — R52 Pain, unspecified: Secondary | ICD-10-CM | POA: Insufficient documentation

## 2018-11-18 DIAGNOSIS — Z7189 Other specified counseling: Secondary | ICD-10-CM | POA: Diagnosis not present

## 2018-11-18 DIAGNOSIS — Z950 Presence of cardiac pacemaker: Secondary | ICD-10-CM

## 2018-11-18 DIAGNOSIS — E785 Hyperlipidemia, unspecified: Secondary | ICD-10-CM | POA: Diagnosis present

## 2018-11-18 DIAGNOSIS — R279 Unspecified lack of coordination: Secondary | ICD-10-CM | POA: Diagnosis not present

## 2018-11-18 DIAGNOSIS — I120 Hypertensive chronic kidney disease with stage 5 chronic kidney disease or end stage renal disease: Secondary | ICD-10-CM | POA: Diagnosis not present

## 2018-11-18 DIAGNOSIS — R001 Bradycardia, unspecified: Secondary | ICD-10-CM | POA: Diagnosis not present

## 2018-11-18 DIAGNOSIS — E119 Type 2 diabetes mellitus without complications: Secondary | ICD-10-CM | POA: Diagnosis not present

## 2018-11-18 HISTORY — DX: Other specified coagulation defects: D68.8

## 2018-11-18 HISTORY — DX: Secondary hyperparathyroidism of renal origin: N25.81

## 2018-11-18 HISTORY — DX: Shortness of breath: R06.02

## 2018-11-18 HISTORY — DX: Type 2 diabetes mellitus with diabetic peripheral angiopathy without gangrene: E11.51

## 2018-11-18 HISTORY — DX: Chronic systolic (congestive) heart failure: I50.22

## 2018-11-18 LAB — CBC WITH DIFFERENTIAL/PLATELET
Abs Immature Granulocytes: 0.03 10*3/uL (ref 0.00–0.07)
Basophils Absolute: 0.1 10*3/uL (ref 0.0–0.1)
Basophils Relative: 1 %
Eosinophils Absolute: 0.2 10*3/uL (ref 0.0–0.5)
Eosinophils Relative: 2 %
HCT: 41.1 % (ref 39.0–52.0)
Hemoglobin: 13.1 g/dL (ref 13.0–17.0)
Immature Granulocytes: 0 %
Lymphocytes Relative: 13 %
Lymphs Abs: 1.2 10*3/uL (ref 0.7–4.0)
MCH: 31 pg (ref 26.0–34.0)
MCHC: 31.9 g/dL (ref 30.0–36.0)
MCV: 97.4 fL (ref 80.0–100.0)
Monocytes Absolute: 0.9 10*3/uL (ref 0.1–1.0)
Monocytes Relative: 10 %
Neutro Abs: 6.8 10*3/uL (ref 1.7–7.7)
Neutrophils Relative %: 74 %
Platelets: 384 10*3/uL (ref 150–400)
RBC: 4.22 MIL/uL (ref 4.22–5.81)
RDW: 12.3 % (ref 11.5–15.5)
WBC: 9.2 10*3/uL (ref 4.0–10.5)
nRBC: 0 % (ref 0.0–0.2)

## 2018-11-18 LAB — BASIC METABOLIC PANEL
Anion gap: 15 (ref 5–15)
BUN: 39 mg/dL — ABNORMAL HIGH (ref 8–23)
CO2: 22 mmol/L (ref 22–32)
Calcium: 9.3 mg/dL (ref 8.9–10.3)
Chloride: 99 mmol/L (ref 98–111)
Creatinine, Ser: 5.87 mg/dL — ABNORMAL HIGH (ref 0.61–1.24)
GFR calc Af Amer: 10 mL/min — ABNORMAL LOW (ref >60)
GFR calc non Af Amer: 9 mL/min — ABNORMAL LOW (ref >60)
Glucose, Bld: 130 mg/dL — ABNORMAL HIGH (ref 70–99)
Potassium: 4.3 mmol/L (ref 3.5–5.1)
Sodium: 136 mmol/L (ref 135–145)

## 2018-11-18 LAB — MAGNESIUM: Magnesium: 2.2 mg/dL (ref 1.7–2.4)

## 2018-11-18 LAB — TROPONIN I: Troponin I: <0.03 ng/mL (ref <0.03)

## 2018-11-18 NOTE — ED Triage Notes (Signed)
BIB [facility] from [facility] with c/o of nausea and fatigue. Pt had first ever dialysis appt on Monday. 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 1 Episode of emesis. Hx of pacemaker.

## 2018-11-18 NOTE — ED Notes (Signed)
Pt was expected to start dialysis Monday @ 1:00/ [facility] according to daughter Avner Stroder [phone]
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
## 2018-11-18 NOTE — ED Notes (Signed)
Admitting doctors at the bedside 

## 2018-11-18 NOTE — ED Notes (Signed)
Condom cath placed on pt 
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
## 2018-11-18 NOTE — ED Notes (Signed)
Unsuccessful attempt to call report to 75[facility] nurse will call back

## 2018-11-19 DIAGNOSIS — L8990 Pressure ulcer of unspecified site, unspecified stage: Secondary | ICD-10-CM | POA: Diagnosis present

## 2018-11-19 DIAGNOSIS — N19 Unspecified kidney failure: Secondary | ICD-10-CM

## 2018-11-19 DIAGNOSIS — R110 Nausea: Secondary | ICD-10-CM

## 2018-11-19 DIAGNOSIS — N186 End stage renal disease: Secondary | ICD-10-CM

## 2018-11-19 DIAGNOSIS — Z992 Dependence on renal dialysis: Secondary | ICD-10-CM | POA: Diagnosis present

## 2018-11-19 DIAGNOSIS — R112 Nausea with vomiting, unspecified: Secondary | ICD-10-CM

## 2018-11-19 DIAGNOSIS — N185 Chronic kidney disease, stage 5: Secondary | ICD-10-CM

## 2018-11-19 DIAGNOSIS — E440 Moderate protein-calorie malnutrition: Secondary | ICD-10-CM | POA: Diagnosis present

## 2018-11-19 HISTORY — DX: Dependence on renal dialysis: N18.6

## 2018-11-19 LAB — RENAL FUNCTION PANEL
Albumin: 2.9 g/dL — ABNORMAL LOW (ref 3.5–5.0)
Anion gap: 11 (ref 5–15)
BUN: 37 mg/dL — ABNORMAL HIGH (ref 8–23)
CO2: 24 mmol/L (ref 22–32)
Calcium: 8.7 mg/dL — ABNORMAL LOW (ref 8.9–10.3)
Chloride: 101 mmol/L (ref 98–111)
Creatinine, Ser: 5.67 mg/dL — ABNORMAL HIGH (ref 0.61–1.24)
GFR calc Af Amer: 10 mL/min — ABNORMAL LOW (ref >60)
GFR calc non Af Amer: 9 mL/min — ABNORMAL LOW (ref >60)
Glucose, Bld: 106 mg/dL — ABNORMAL HIGH (ref 70–99)
Phosphorus: 4.2 mg/dL (ref 2.5–4.6)
Potassium: 3.8 mmol/L (ref 3.5–5.1)
Sodium: 136 mmol/L (ref 135–145)

## 2018-11-19 LAB — CBC
HCT: 35.8 % — ABNORMAL LOW (ref 39.0–52.0)
Hemoglobin: 11.7 g/dL — ABNORMAL LOW (ref 13.0–17.0)
MCH: 31.2 pg (ref 26.0–34.0)
MCHC: 32.7 g/dL (ref 30.0–36.0)
MCV: 95.5 fL (ref 80.0–100.0)
Platelets: 399 10*3/uL (ref 150–400)
RBC: 3.75 MIL/uL — ABNORMAL LOW (ref 4.22–5.81)
RDW: 12.2 % (ref 11.5–15.5)
WBC: 7.3 10*3/uL (ref 4.0–10.5)
nRBC: 0 % (ref 0.0–0.2)

## 2018-11-19 LAB — URINALYSIS, ROUTINE W REFLEX MICROSCOPIC
Bacteria, UA: NONE SEEN
Bilirubin Urine: NEGATIVE
Glucose, UA: NEGATIVE mg/dL
Hgb urine dipstick: NEGATIVE
Ketones, ur: NEGATIVE mg/dL
Leukocytes, UA: NEGATIVE
Nitrite: NEGATIVE
Protein, ur: 100 mg/dL — AB
Specific Gravity, Urine: 1.008 (ref 1.005–1.030)
pH: 8 (ref 5.0–8.0)

## 2018-11-19 LAB — MRSA PCR SCREENING: MRSA by PCR: NEGATIVE

## 2018-11-19 LAB — HEPATITIS B SURFACE ANTIGEN: Hepatitis B Surface Ag: NEGATIVE

## 2018-11-19 MED ORDER — POLYETHYLENE GLYCOL 3350 17 G PO PACK: 17.0000 g | PACK | Freq: Every day | ORAL | Status: DC | PRN

## 2018-11-19 MED ORDER — PANTOPRAZOLE SODIUM 20 MG PO TBEC
20.0000 mg | DELAYED_RELEASE_TABLET | Freq: Every day | ORAL | Status: DC
  Administered 2018-11-19 – 2018-11-24 (×6): 20 mg via ORAL
  Filled 2018-11-19 (×6): qty 1

## 2018-11-19 MED ORDER — ASPIRIN 81 MG PO CHEW
81.0000 mg | CHEWABLE_TABLET | Freq: Every day | ORAL | Status: DC
  Administered 2018-11-19 – 2018-11-24 (×6): 81 mg via ORAL
  Filled 2018-11-19 (×6): qty 1

## 2018-11-19 MED ORDER — SODIUM CHLORIDE 0.9 % IV SOLN: 100.0000 mL | INTRAVENOUS | Status: DC | PRN

## 2018-11-19 MED ORDER — ACETAMINOPHEN 650 MG RE SUPP: 650.0000 mg | Freq: Four times a day (QID) | RECTAL | Status: DC | PRN

## 2018-11-19 MED ORDER — AMIODARONE HCL 200 MG PO TABS
200.0000 mg | ORAL_TABLET | Freq: Every day | ORAL | Status: DC
  Administered 2018-11-19 – 2018-11-24 (×6): 200 mg via ORAL
  Filled 2018-11-19 (×6): qty 1

## 2018-11-19 MED ORDER — ACETAMINOPHEN 325 MG PO TABS
650.0000 mg | ORAL_TABLET | Freq: Four times a day (QID) | ORAL | Status: DC | PRN
  Administered 2018-11-20 – 2018-11-22 (×2): 650 mg via ORAL
  Filled 2018-11-19 (×2): qty 2

## 2018-11-19 MED ORDER — ROSUVASTATIN CALCIUM 5 MG PO TABS
10.0000 mg | ORAL_TABLET | Freq: Every day | ORAL | Status: DC
  Administered 2018-11-19 – 2018-11-23 (×5): 10 mg via ORAL
  Filled 2018-11-19 (×5): qty 2

## 2018-11-19 MED ORDER — RENA-VITE PO TABS
1.0000 | ORAL_TABLET | Freq: Every day | ORAL | Status: DC
  Administered 2018-11-19 – 2018-11-23 (×5): 1 via ORAL
  Filled 2018-11-19 (×5): qty 1

## 2018-11-19 MED ORDER — AMLODIPINE BESYLATE 10 MG PO TABS
10.0000 mg | ORAL_TABLET | Freq: Every day | ORAL | Status: DC
  Administered 2018-11-19 – 2018-11-24 (×4): 10 mg via ORAL
  Filled 2018-11-19 (×4): qty 1

## 2018-11-19 MED ORDER — PENTAFLUOROPROP-TETRAFLUOROETH EX AERO: 1.0000 "application " | INHALATION_SPRAY | CUTANEOUS | Status: DC | PRN

## 2018-11-19 MED ORDER — HYDRALAZINE HCL 50 MG PO TABS
75.0000 mg | ORAL_TABLET | Freq: Three times a day (TID) | ORAL | Status: DC
  Administered 2018-11-19 – 2018-11-24 (×11): 75 mg via ORAL
  Filled 2018-11-19 (×12): qty 1

## 2018-11-19 MED ORDER — CHLORHEXIDINE GLUCONATE CLOTH 2 % EX PADS
6.0000 | MEDICATED_PAD | Freq: Every day | CUTANEOUS | Status: DC
  Administered 2018-11-19 – 2018-11-22 (×3): 6 via TOPICAL

## 2018-11-19 MED ORDER — LIDOCAINE HCL (PF) 1 % IJ SOLN: 5.0000 mL | INTRAMUSCULAR | Status: DC | PRN

## 2018-11-19 MED ORDER — SODIUM BICARBONATE 650 MG PO TABS
650.0000 mg | ORAL_TABLET | Freq: Two times a day (BID) | ORAL | Status: DC
  Administered 2018-11-19 – 2018-11-24 (×12): 650 mg via ORAL
  Filled 2018-11-19 (×12): qty 1

## 2018-11-19 MED ORDER — PROMETHAZINE HCL 25 MG PO TABS: 12.5000 mg | ORAL_TABLET | Freq: Four times a day (QID) | ORAL | Status: DC | PRN

## 2018-11-19 MED ORDER — METOPROLOL TARTRATE 50 MG PO TABS
150.0000 mg | ORAL_TABLET | Freq: Two times a day (BID) | ORAL | Status: DC
  Administered 2018-11-19 – 2018-11-24 (×10): 150 mg via ORAL
  Filled 2018-11-19 (×11): qty 1

## 2018-11-19 MED ORDER — ENOXAPARIN SODIUM 30 MG/0.3ML ~~LOC~~ SOLN: 30.0000 mg | SUBCUTANEOUS | Status: DC

## 2018-11-19 MED ORDER — ENSURE ENLIVE PO LIQD
237.0000 mL | Freq: Two times a day (BID) | ORAL | Status: DC
  Administered 2018-11-20 – 2018-11-24 (×6): 237 mL via ORAL

## 2018-11-19 MED ORDER — LIDOCAINE-PRILOCAINE 2.5-2.5 % EX CREA: 1.0000 "application " | TOPICAL_CREAM | CUTANEOUS | Status: DC | PRN

## 2018-11-19 MED ORDER — CLONIDINE HCL 0.1 MG PO TABS
0.1000 mg | ORAL_TABLET | Freq: Two times a day (BID) | ORAL | Status: DC
  Administered 2018-11-19 – 2018-11-24 (×9): 0.1 mg via ORAL
  Filled 2018-11-19 (×11): qty 1

## 2018-11-19 MED ORDER — HEPARIN SODIUM (PORCINE) 5000 UNIT/ML IJ SOLN
5000.0000 [IU] | Freq: Three times a day (TID) | INTRAMUSCULAR | Status: DC
  Administered 2018-11-19 – 2018-11-24 (×15): 5000 [IU] via SUBCUTANEOUS
  Filled 2018-11-19 (×15): qty 1
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
## 2018-11-19 NOTE — Progress Notes (Signed)
Family Medicine Teaching Service Daily Progress Note Intern Pager: [phone]  Patient name: William [name] Medical record number: [id number] Date of birth: 24-Dec-1940 Age: 78 y.o. Gender: male  Primary Care Provider: [name], DO Consultants: Nephrology Code Status: DNR/DNI  Pt Overview and Major Events to Date:  2/8: admitted to [facility], Nephrology consulted  Assessment and Plan: WYLEY HACK is a 78 y.o. male presenting with generalized fatigue with nausea/vomiting and anorexia secondary to ESRD . PMH is significant for CAD, HFpEF, complete heart block, DM-II, HTN.   Hospitalized on 1/28-2/4 for CHF exacerbation, discharged with nephrology follow-up with plans for HD ~2/9, AVG placed on 1/9.  Fatigue/Nausea in setting of new ESRD: Similar symptoms during prior visit, attributed to uremia from worsening kidney function. Nephrology consulted in ED, with plans for HD during admission.  Nausea/vomiting improved, persistent loose diarrhea overnight (x3). Electrolytes WNL including Mag and Phos. BUN 39>37, Cr 5.87>5.67 (improved from prior admission). CBC WNL. Hemodynamically stable and afebrile. Could be viral gastroenteritis as he has been in SNF, unlikely C-diff given stable labs and vitals.  - nephrology consulted, will follow up recs - consider GI panel if no improvement after diuresis - continue home NaBicarb 650mg  BID - Continue to hold lasix pending nephro recs - daily weights, monitor ins and outs - incentive spirometry - PRN phenergan for nausea (QTc 486)  DM-II - A1C 6.3 (09/2018).  Patient is diet controlled.  Did not receive insulin during previous admission.   - monitor CBGs with BMET daily - consider sSSI if needed  HFpEF  CAD s/p PCI, complete heart block with pacemaker  H/o V-tach  ECHO 76/19: vigorous systolic function (50-93% EF), G1DD mild aortic stenosis, severe LVH. Patient currently taking amiodarone 200mg  QD, rosuvastatin 10mg , Metoprolol 150mg  BID,  ASA 81mg  - continue home meds  HTN - Soft BP this AM 90/41. Home Meds: amlodipine 10mg , clonidine 0.1mg  BID, hydralazine 75mg  TID, and lopressor 150mg  BID.  - continue home meds - monitor blood pressure - consider decreasing Amlodipine if continues to have soft BP's, especially s/p diuresis  FEN/GI: renal/carb controlled. protonix Prophylaxis: heparin SQ  Disposition: pending Nephro recs, improvement, and HD  Subjective:  Feels well this morning except for some persistent diarrhea. 3 episodes of loose brown diarrhea overnight with times of incontinence in bed. Denies any pelvic numbness or tingling. Denies nausea or vomiting. Tolerated PO this morning. Denies any headaches or changes in vision. Denies any other concerns or complaints.  Objective: Temp:  [98 F (36.7 C)-99.3 F (37.4 C)] 98.1 F (36.7 C) (02/08 0442) Pulse Rate:  [61-69] 63 (02/08 0442) Resp:  [10-16] 14 (02/08 0036) BP: (120-170)/(52-72) 120/52 (02/08 0442) SpO2:  [97 %-100 %] 100 % (02/08 0442) Weight:  [72.4 kg-75.7 kg] 72.4 kg (02/08 0036) Physical Exam: General: well nourished, well developed, in no acute distress with non-toxic appearance, lying comfortably in bed CV: regular rate and rhythm without murmurs, rubs, or gallops, no lower extremity edema Lungs: clear to auscultation bilaterally with normal work of breathing Abdomen: soft, non-tender, non-distended, increased bowel sounds Skin: warm, dry, no rashes or lesions Extremities: warm and well perfused, normal tone Neuro: Alert and oriented, speech normal  Laboratory: Recent Labs  Lab 11/14/18 0644 11/18/18 2055 11/19/18 0617  WBC 6.1 9.2 7.3  HGB 11.1* 13.1 11.7*  HCT 33.0* 41.1 35.8*  PLT 320 384 399   Recent Labs  Lab 11/14/18 0644 11/18/18 2055 11/19/18 0617  NA 137 136 136  K 4.1 4.3 3.8  CL 101 99 101  CO2 23 22 24   BUN 53* 39* 37*  CREATININE 6.74* 5.87* 5.67*  CALCIUM 8.7* 9.3 8.7*  GLUCOSE 117* 130* 106*   Mag  2.2 Phos 4.2 Trop <0.03  Urinalysis    Component Value Date/Time   COLORURINE STRAW (A) 11/19/2018 0026   APPEARANCEUR CLEAR 11/19/2018 0026   LABSPEC 1.008 11/19/2018 0026   PHURINE 8.0 11/19/2018 0026   GLUCOSEU NEGATIVE 11/19/2018 0026   HGBUR NEGATIVE 11/19/2018 0026   BILIRUBINUR NEGATIVE 11/19/2018 0026   KETONESUR NEGATIVE 11/19/2018 0026   PROTEINUR 100 (A) 11/19/2018 0026   NITRITE NEGATIVE 11/19/2018 0026   LEUKOCYTESUR NEGATIVE 11/19/2018 0026   Imaging/Diagnostic Tests: Dg Chest Port 1 View - 11/18/2018 IMPRESSION: 1. Borderline cardiomegaly.  No acute pulmonary process. Electronically Signed   By: Elon Alas M.D.   On: 11/18/2018 21:27   Danna Hefty, DO 11/19/2018, 8:27 AM PGY-1, [place] Intern pager: [phone], text pages welcome

## 2018-11-19 NOTE — ED Notes (Signed)
Report given to rn on 70m

## 2018-11-19 NOTE — Progress Notes (Signed)
Initial Nutrition Assessment  DOCUMENTATION CODES:   Non-severe (moderate) malnutrition in context of chronic illness  INTERVENTION:    Ensure Enlive po BID, each supplement provides 350 kcal and 20 grams of protein  Liberalize diet   Start Rena-Vit  Provide new HD education once pt is able.   NUTRITION DIAGNOSIS:   Moderate Malnutrition related to chronic illness(ESRD on HD) as evidenced by energy intake < 75% for > or equal to 1 month, mild fat depletion, moderate muscle depletion, severe muscle depletion.   GOAL:   Patient will meet greater than or equal to 90% of their needs  MONITOR:   PO intake, Supplement acceptance, Weight trends, Labs, I & O's  REASON FOR ASSESSMENT:   Malnutrition Screening Tool    ASSESSMENT:   Patient with PMH significant for ESRD, CAD, CHF, complete heart block, DM, HTN, and ESRD. Recently admitted 1/28-2/4 for CHF exacerbation discharged with plans for HD on 2/9 (AVG placed on 1/9). Presents this admission with fatigue/nasuea in setting of new ESRD.    Pt in HD at the time of RD visit. Pt complained of fatigue making it difficult to provide history.   PTA pt started to see a decline in intake over the last month due to hospitalizations. States his appetite has been nonexistent over the last week due to increasingly worse nausea. States during this time period he was provided with three meals daily but could only tolerate ~25%. He previously was drinking Ensure but stopped over the last week. Meal completions charted as 0-50% for pt's last three meals.   He is unsure of his EDW. Per records, pt came in at 75.7 kg and is down to 71.4 kg after HD. Nephrology note pending.   Pt continues to make urine but unable to quantify how much daily.   Pt will need new HD education once he is able.   I/O: +270 ml  UOP: not charted- looked to have 50 ml in catheter bag Net UF: 0 ml on 2/8  Medications reviewed and include: sodium bicarb Labs  reviewed: corrected calcium 9.6 (wdl)  NUTRITION - FOCUSED PHYSICAL EXAM:    Most Recent Value  Orbital Region  No depletion  Upper Arm Region  Moderate depletion  Thoracic and Lumbar Region  Unable to assess  Buccal Region  Mild depletion  Temple Region  Moderate depletion  Clavicle Bone Region  Mild depletion  Clavicle and Acromion Bone Region  Moderate depletion  Scapular Bone Region  Unable to assess  Dorsal Hand  No depletion  Patellar Region  Severe depletion  Anterior Thigh Region  Severe depletion  Posterior Calf Region  Severe depletion  Edema (RD Assessment)  Mild  Hair  Reviewed  Eyes  Reviewed  Mouth  Reviewed  Skin  Reviewed  Nails  Reviewed     Diet Order:   Diet Order            Diet renal/carb modified with fluid restriction Diet-HS Snack? Nothing; Fluid restriction: 1200 mL Fluid; Room service appropriate? Yes; Fluid consistency: Thin  Diet effective now              EDUCATION NEEDS:   Education needs have been addressed  Skin:     Last BM:     Height:   Ht Readings from Last 1 Encounters:  11/19/18 5\' 7"  (1.702 m)    Weight:   Wt Readings from Last 1 Encounters:  11/19/18 71.4 kg    Ideal Body Weight:  67.3  kg  BMI:  Body mass index is 24.65 kg/m.  Estimated Nutritional Needs:   Kcal:  2200-2400 kcal  Protein:  115-130 grams  Fluid:  1000 ml + UOP   Mariana Single RD, LDN Clinical Nutrition Pager # [phone]

## 2018-11-20 DIAGNOSIS — E440 Moderate protein-calorie malnutrition: Secondary | ICD-10-CM

## 2018-11-20 DIAGNOSIS — N186 End stage renal disease: Secondary | ICD-10-CM

## 2018-11-20 DIAGNOSIS — F0390 Unspecified dementia without behavioral disturbance: Secondary | ICD-10-CM

## 2018-11-20 LAB — HEPATITIS B CORE ANTIBODY, IGM: Hep B C IgM: NEGATIVE

## 2018-11-20 LAB — PHOSPHORUS: Phosphorus: 3.8 mg/dL (ref 2.5–4.6)

## 2018-11-20 LAB — HEPATITIS B SURFACE ANTIBODY,QUALITATIVE: Hep B S Ab: NONREACTIVE

## 2018-11-20 NOTE — Progress Notes (Signed)
[facility]    NEPHROLOGY PROGRESS NOTE  SUBJECTIVE: Currently without complaints.  Denies chest pain, shortness of breath, nausea, vomiting, diarrhea or dysuria.  Reports his dialysis went well yesterday.  We will plan for 3 hours of dialysis tomorrow.  All other review of systems are negative.     OBJECTIVE:  Vitals:   11/20/18 0541 11/20/18 0735  BP: (!) 137/57 (!) 147/81  Pulse: (!) 59 (!) 57  Resp:  18  Temp: 98.6 F (37 C) 97.9 F (36.6 C)  SpO2: 100% 99%    Intake/Output Summary (Last 24 hours) at 11/20/2018 1715 Last data filed at 11/20/2018 1319 Gross per 24 hour  Intake 0 ml  Output 450 ml  Net -450 ml      [place]earl:  AAOx3 NAD HEENT: MMM [place] AT anicteric sclera Neck:  No JVD, no adenopathy CV:  Heart RRR  Lungs:  L/S CTA bilaterally Abd:  abd SNT/ND with normal BS GU:  Bladder non-palpable Extremities:  No LE edema.  Right upper extremity AV fistula Skin:  No skin rash  MEDICATIONS:  . amiodarone  200 mg Oral Daily  . amLODipine  10 mg Oral Daily  . aspirin  81 mg Oral Daily  . Chlorhexidine Gluconate Cloth  6 each Topical Q0600  . cloNIDine  0.1 mg Oral BID  . feeding supplement (ENSURE ENLIVE)  237 mL Oral BID BM  . heparin injection (subcutaneous)  5,000 Units Subcutaneous Q8H  . hydrALAZINE  75 mg Oral Q8H  . metoprolol tartrate  150 mg Oral BID  . multivitamin  1 tablet Oral QHS  . pantoprazole  20 mg Oral Daily  . rosuvastatin  10 mg Oral q1800  . sodium bicarbonate  650 mg Oral BID       LABS:   CBC Latest Ref Rng & Units 11/19/2018 11/18/2018 11/14/2018  WBC 4.0 - 10.5 K/uL 7.3 9.2 6.1  Hemoglobin 13.0 - 17.0 g/dL 11.7(L) 13.1 11.1(L)  Hematocrit 39.0 - 52.0 % 35.8(L) 41.1 33.0(L)  Platelets 150 - 400 K/uL 399 384 320    CMP Latest Ref Rng & Units 11/19/2018 11/18/2018 11/14/2018  Glucose 70 - 99 mg/dL 106(H) 130(H) 117(H)  BUN 8 - 23 mg/dL 37(H) 39(H) 53(H)  Creatinine 0.61 - 1.24 mg/dL 5.67(H) 5.87(H) 6.74(H)  Sodium 135 - 145  mmol/L 136 136 137  Potassium 3.5 - 5.1 mmol/L 3.8 4.3 4.1  Chloride 98 - 111 mmol/L 101 99 101  CO2 22 - 32 mmol/L 24 22 23   Calcium 8.9 - 10.3 mg/dL 8.7(L) 9.3 8.7(L)  Total Protein 6.5 - 8.1 g/dL - - -  Total Bilirubin 0.3 - 1.2 mg/dL - - -  Alkaline Phos 38 - 126 U/L - - -  AST 15 - 41 U/L - - -  ALT 0 - 44 U/L - - -    Lab Results  Component Value Date   CALCIUM 8.7 (L) 11/19/2018   PHOS 3.8 11/20/2018       Component Value Date/Time   COLORURINE STRAW (A) 11/19/2018 0026   APPEARANCEUR CLEAR 11/19/2018 0026   LABSPEC 1.008 11/19/2018 0026   PHURINE 8.0 11/19/2018 0026   GLUCOSEU NEGATIVE 11/19/2018 0026   HGBUR NEGATIVE 11/19/2018 0026   BILIRUBINUR NEGATIVE 11/19/2018 0026   KETONESUR NEGATIVE 11/19/2018 0026   PROTEINUR 100 (A) 11/19/2018 0026   NITRITE NEGATIVE 11/19/2018 0026   LEUKOCYTESUR NEGATIVE 11/19/2018 0026      Component Value Date/Time   HCO3 26.3 (H) 03/30/2007 0201  TCO2 28 03/30/2007 0201    No results found for: IRON, TIBC, FERRITIN, IRONPCTSAT     ASSESSMENT/PLAN:    1.  End-stage renal disease.  We will begin hemodialysis for uremic symptoms.  First dialysis was on Saturday for 2 hours.  We will plan second dialysis treatment on Monday for 3 hours.  Was already set up as an outpatient for dialysis.  2.  Uremia.  As above, will begin dialysis via right upper extremity AV fistula.  3.  Type 2 diabetes.  Overall blood sugar control is reasonable.  Continue current therapy.  4.  Heart failure with preserved ejection fraction.  This should be easily managed on dialysis.  Ejection fraction from 12/29 was 65 to 70%.  5.  Complete heart block status post pacemaker.  6.  BMD.    Phosphorus control excellent.  7.  Anemia.  Hemoglobin is above goal.  We will continue to monitor for need for ESA.      [place], DO, [place]

## 2018-11-20 NOTE — Progress Notes (Signed)
Family Medicine Teaching Service Daily Progress Note Intern Pager: [phone]  Patient name: William [name] Medical record number: [id number] Date of birth: 04-04-1941 Age: 78 y.o. Gender: male  Primary Care Provider: [name], DO Consultants: Nephrology Code Status: DNR/DNI  Pt Overview and Major Events to Date:  2/8: admitted to [facility], Nephrology consulted  Assessment and Plan: William [name] is a 78 y.o. male presenting with generalized fatigue with nausea/vomiting and anorexia secondary to ESRD. PMH is significant for CAD, HFpEF, complete heart block, DM-II, HTN.   Hospitalized on 1/28-2/4 for CHF exacerbation, discharged with nephrology follow-up with plans for HD ~2/9, AVG placed on 1/9.  Fatigue/Nausea in setting of new ESRD: Similar symptoms during prior visit, attributed to uremia from worsening kidney function. Nephrology following with plans for therapeutic HD.   Electrolytes WNL including Mag and Phos. BUN 39>37, Cr 5.87>5.67. Hemodynamically stable and afebrile. Could be viral gastroenteritis as he has been in SNF, unlikely C-diff given stable labs and vitals.  - nephrology consulted, HD on 2/8 and again on 2/10 for relief of uremic symptoms - consider GI panel if no improvement after diuresis on 2/10 - continue home NaBicarb 650mg  BID - Continue to hold lasix  - daily weights, monitor ins and outs - incentive spirometry - PRN phenergan for nausea (QTc 486)  DM-II - A1C 6.3 (09/2018).  Patient is diet controlled.  Did not receive insulin during previous admission. CBGs since admission range from 104-170 fasting - monitor CBGs with BMET daily - consider sSSI if glucose consistently above 200 fasting  HFpEF  CAD s/p PCI, complete heart block with pacemaker  H/o V-tach  ECHO 53/29: vigorous systolic function (92-42% EF), G1DD mild aortic stenosis, severe LVH. Patient currently taking amiodarone 200mg  QD, rosuvastatin 10mg , Metoprolol 150mg  BID, ASA 81mg  -  continue home meds  HTN - Improved. SBP 90-142 in past 24 hours. Home Meds: amlodipine 10mg , clonidine 0.1mg  BID, hydralazine 75mg  TID, and lopressor 150mg  BID.  - continue home meds - monitor blood pressure - consider decreasing Amlodipine if continues to have soft BP's, especially s/p diuresis  FEN/GI: renal/carb controlled. protonix Prophylaxis: heparin SQ  Disposition: pending Nephro recs, improvement, and HD  Subjective:  Feels good this morning and no longer complaining of nausea or diarrhea. He states he may feel like eating and will attempt to eat breakfast this morning.  Objective: Temp:  [97.5 F (36.4 C)-98.2 F (36.8 C)] 98.2 F (36.8 C) (02/08 2159) Pulse Rate:  [59-60] 59 (02/08 2159) Resp:  [15-18] 18 (02/08 1645) BP: (90-142)/(41-70) 130/55 (02/08 2159) SpO2:  [95 %-100 %] 100 % (02/08 2159) FiO2 (%):  [16 %] 16 % (02/08 2159) Weight:  [71.3 kg-71.4 kg] 71.4 kg (02/08 1524) Physical Exam: Gen: Alert and Oriented to person and place HEENT: NCAT CV: RRR, 2/6 systolic murmur, normal S1, S2 split Resp: CTAB, no wheezing, rales, or rhonchi, comfortable work of breathing Abd: non-distended, non-tender, soft, +bs in all four quadrants Ext: no clubbing, cyanosis, or edema Skin: warm, dry, intact, no rashes  Laboratory: Recent Labs  Lab 11/14/18 0644 11/18/18 2055 11/19/18 0617  WBC 6.1 9.2 7.3  HGB 11.1* 13.1 11.7*  HCT 33.0* 41.1 35.8*  PLT 320 384 399   Recent Labs  Lab 11/14/18 0644 11/18/18 2055 11/19/18 0617  NA 137 136 136  K 4.1 4.3 3.8  CL 101 99 101  CO2 23 22 24   BUN 53* 39* 37*  CREATININE 6.74* 5.87* 5.67*  CALCIUM 8.7* 9.3 8.7*  GLUCOSE 117* 130* 106*   Mag 2.2 Phos 4.2 Trop <0.03  Urinalysis    Component Value Date/Time   COLORURINE STRAW (A) 11/19/2018 0026   APPEARANCEUR CLEAR 11/19/2018 0026   LABSPEC 1.008 11/19/2018 0026   PHURINE 8.0 11/19/2018 0026   GLUCOSEU NEGATIVE 11/19/2018 0026   HGBUR NEGATIVE 11/19/2018  0026   BILIRUBINUR NEGATIVE 11/19/2018 0026   KETONESUR NEGATIVE 11/19/2018 0026   PROTEINUR 100 (A) 11/19/2018 0026   NITRITE NEGATIVE 11/19/2018 0026   LEUKOCYTESUR NEGATIVE 11/19/2018 0026   Imaging/Diagnostic Tests: Dg Chest Port 1 View - 11/18/2018 IMPRESSION: 1. Borderline cardiomegaly.  No acute pulmonary process. Electronically Signed   By: Elon Alas M.D.   On: 11/18/2018 21:27   [name], DO 11/20/2018, 5:31 AM PGY-2, [place] Intern pager: [phone], text pages welcome

## 2018-11-20 NOTE — Discharge Summary (Addendum)
[facility] Discharge Summary  Patient name: William [name] Medical record number: [id number] Date of birth: [birth year] Age: 78 y.o. Gender: male Date of Admission: 11/18/2018  Date of Discharge: 11/24/18 Admitting Physician: Martyn Malay, MD  Primary Care Provider: [name], DO Consultants: Nephrology  Indication for Hospitalization: Generalized fatigue, nausea, vomiting, anorxia  Discharge Diagnoses/Problem List:  Nausea and vomiting ESRD HFpEF HTN   Disposition: SNF  Discharge Condition: stable  Discharge Exam:  BP (!) 117/51 (BP Location: Left Arm)   Pulse (!) 59   Temp 98.8 F (37.1 C) (Oral)   Resp 18   Ht 5\' 7"  (1.702 m)   Wt 72.2 kg   SpO2 100%   BMI 24.93 kg/m   Physical Exam  Gen: Lying in bed comfortably with lights off in room  CV: 2/6 holosystolic murmur. 2+ left RP, 1+ right RP. No BLEE Resp:. No increased WOB. CTAB Abd: NTND on palpation to all 4 quadrants.   Extremities: moves extremities spontaneously. Warm and well perfused.   Brief Hospital Course:  William [name] is a 78 y.o. male with past medical history significant for T2DM, renal cyst, subdural hematoma, HTN, HLD, who presented with nausea, vomiting, diarrhea, and generalized fatigue likely  secondary to uremia in the setting of new ESRD. Patient was discharged from [facility] on 11/15/18 s/p CHF exacerbation in the setting of CKD V with plans to start HD on 2/10 when he developed the above symptoms. Nephrology was consulted upon admission and patient received his first HD on 11/19/18. He continued to receive HD via his right AVF while inpatient with improvement in his symptoms. Patient was medically stable for discharge on 2/10 and awaited SNF approval from this point forward. Patient had close follow up with nephrology and outpatient dialysis arranged.   Goals of Care: Palliative was consulted during admission for goals of care discussion.  Issues for  Follow Up:  1. HD schedule MWF  2. Continue to follow CXR/pulm symptoms while on amiodarone   Significant Procedures: None  Significant Labs and Imaging:  CBC: Recent Labs  Lab 11/18/18 2055 11/19/18 0617 11/22/18 0310 11/23/18 0750  WBC 9.2 7.3 7.2 9.6  NEUTROABS 6.8  --   --   --   HGB 13.1 11.7* 10.5* 10.4*  HCT 41.1 35.8* 32.7* 32.7*  MCV 97.4 95.5 97.0 98.2  PLT 384 399 350 784   Basic Metabolic Panel: Recent Labs  Lab 11/18/18 2055 11/19/18 0617 11/20/18 0558 11/21/18 0432 11/22/18 0310 11/23/18 0750  NA 136 136  --  137 137 138  K 4.3 3.8  --  3.9 3.9 4.2  CL 99 101  --  102 100 101  CO2 22 24  --  26 26 30   GLUCOSE 130* 106*  --  127* 107* 111*  BUN 39* 37*  --  30* 30* 24*  CREATININE 5.87* 5.67*  --  5.52* 5.51* 4.76*  CALCIUM 9.3 8.7*  --  8.3* 8.5* 8.4*  MG 2.2  --   --   --   --   --   PHOS  --  4.2 3.8 3.7 3.1 2.8   GFR: Estimated Creatinine Clearance: 12.2 mL/min (A) (by C-G formula based on SCr of 4.76 mg/dL (H)). Liver Function Tests: Recent Labs  Lab 11/19/18 0617 11/21/18 0432 11/22/18 0310 11/23/18 0750  ALBUMIN 2.9* 2.6* 2.6* 2.6*   Cardiac Enzymes: Recent Labs  Lab 11/18/18 2055  TROPONINI <0.03   Urine analysis:  Component Value Date/Time   COLORURINE STRAW (A) 11/19/2018 0026   APPEARANCEUR CLEAR 11/19/2018 0026   LABSPEC 1.008 11/19/2018 0026   PHURINE 8.0 11/19/2018 0026   GLUCOSEU NEGATIVE 11/19/2018 0026   HGBUR NEGATIVE 11/19/2018 0026   BILIRUBINUR NEGATIVE 11/19/2018 0026   KETONESUR NEGATIVE 11/19/2018 0026   PROTEINUR 100 (A) 11/19/2018 0026   NITRITE NEGATIVE 11/19/2018 0026   LEUKOCYTESUR NEGATIVE 11/19/2018 0026    Results/Tests Pending at Time of Discharge: none   Discharge Medications:  Allergies as of 11/24/2018      Reactions   Carvedilol Other (See Comments)   Makes him feel like his pelvis is "grinding"      Medication List    STOP taking these medications   feeding supplement (ENSURE  ENLIVE) Liqd     TAKE these medications   amiodarone 200 MG tablet Commonly known as:  PACERONE Take 1 tablet (200 mg total) by mouth daily.   amLODipine 10 MG tablet Commonly known as:  NORVASC Take 1 tablet (10 mg total) by mouth daily.   aspirin 81 MG chewable tablet Chew 1 tablet (81 mg total) by mouth daily.   cloNIDine 0.1 MG tablet Commonly known as:  CATAPRES Take 1 tablet (0.1 mg total) by mouth 2 (two) times daily.   furosemide 40 MG tablet Commonly known as:  LASIX Take 1 tablet (40 mg total) by mouth 2 (two) times daily.   hydrALAZINE 25 MG tablet Commonly known as:  APRESOLINE Take 75 mg by mouth 3 (three) times daily. What changed:  Another medication with the same name was removed. Continue taking this medication, and follow the directions you see here.   metoprolol tartrate 50 MG tablet Commonly known as:  LOPRESSOR Take 3 tablets (150 mg total) by mouth 2 (two) times daily.   multivitamin Tabs tablet Take 1 tablet by mouth at bedtime.   pantoprazole 20 MG tablet Commonly known as:  PROTONIX Take 1 tablet (20 mg total) by mouth daily.   polyethylene glycol packet Commonly known as:  MIRALAX / GLYCOLAX Take 17 g by mouth daily.   rosuvastatin 10 MG tablet Commonly known as:  CRESTOR Take 1 tablet (10 mg total) by mouth daily at 6 PM.   sodium bicarbonate 650 MG tablet Take 1 tablet (650 mg total) by mouth 2 (two) times daily.       Discharge Instructions: Please refer to Patient Instructions section of EMR for full details.  Patient was counseled important signs and symptoms that should prompt return to medical care, changes in medications, dietary instructions, activity restrictions, and follow up appointments.   Follow-Up Appointments:   Future Appointments  Date Time Provider [place]  02/27/2019  9:40 AM Nahser, Wonda Cheng, MD [facility]    Wilber Oliphant, MD 11/24/2018, 11:00 AM PGY-1, [place]

## 2018-11-21 DIAGNOSIS — Z515 Encounter for palliative care: Secondary | ICD-10-CM

## 2018-11-21 DIAGNOSIS — Z7189 Other specified counseling: Secondary | ICD-10-CM

## 2018-11-21 LAB — RENAL FUNCTION PANEL
Albumin: 2.6 g/dL — ABNORMAL LOW (ref 3.5–5.0)
Anion gap: 9 (ref 5–15)
BUN: 30 mg/dL — ABNORMAL HIGH (ref 8–23)
CO2: 26 mmol/L (ref 22–32)
Calcium: 8.3 mg/dL — ABNORMAL LOW (ref 8.9–10.3)
Chloride: 102 mmol/L (ref 98–111)
Creatinine, Ser: 5.52 mg/dL — ABNORMAL HIGH (ref 0.61–1.24)
GFR calc Af Amer: 11 mL/min — ABNORMAL LOW (ref >60)
GFR calc non Af Amer: 9 mL/min — ABNORMAL LOW (ref >60)
Glucose, Bld: 127 mg/dL — ABNORMAL HIGH (ref 70–99)
Phosphorus: 3.7 mg/dL (ref 2.5–4.6)
Potassium: 3.9 mmol/L (ref 3.5–5.1)
Sodium: 137 mmol/L (ref 135–145)

## 2018-11-21 LAB — PARATHYROID HORMONE, INTACT (NO CA): PTH: 141 pg/mL — ABNORMAL HIGH (ref 15–65)

## 2018-11-21 NOTE — Progress Notes (Signed)
PT Cancellation Note  Patient Details Name: William [name] MRN: [id number] DOB: [birth year]   Cancelled Treatment:    Reason Eval/Treat Not Completed: Fatigue/lethargy limiting ability to participate. Pt declining any mobility assessment with PT at this time. Pt states he is very fatigued and just wants to get some rest. Asking that therapy come back after his HD session. Educated pt that some pts find they are more fatigued after HD, however pt continues to decline. Will check back as schedule allows to initiate PT evaluation.    Thelma Comp 11/21/2018, 8:41 AM   Rolinda Roan, PT, DPT Acute Rehabilitation Services Pager: [phone] Office: [phone]

## 2018-11-21 NOTE — Progress Notes (Signed)
Daily Progress Note   Patient Name: William [name]       Date: 11/21/18 DOB: 1941-03-11  Age: 78 y.o. MRN#: [id number] Attending Physician: Martyn Malay, MD Primary Care Physician: [name], DO Admit Date: 11/18/2018  Reason for Consultation/Follow-up: Establishing goals of care  Subjective/GOC:  Patient awake, alert, oriented. Denies pain or discomfort. Waiting to go to dialysis this afternoon. No family at bedside.  This NP, met with patient and daughter William [name]) last hospitalization and completed GOC/MOST form. Note reviewed. Patient wishes include DNR/DNI, limited interventions including CPAP/BiPAP if indicated, IVF/ABX if indicated, and feeding tube for a time trial. Durable DNR completed. Copies made for chart, patient, and children. During this conversation, patient willing to start outpatient when nephrology feels indicated. Patient with very supportive children and has open, honest conversations with them regarding his goals and EOL wishes.  Mr. Stauffer had his first dialysis on Saturday. He describes this experience as "transparent" and reports that his only discomfort was at the beginning when needle was placed. He is eager to see how today goes and is hopeful to get on a routine that gives him "quality" of life.   After last hospitalization, patient discharged to SNF for rehab. He tells me he has been doing well with ambulation. He will return to SNF for more rehab with hopes to discharge to his daughter's home for added support (two granddaughters available to help). He wishes to eventually return to his home, which is close to [place]. He plans to look into meals on wheels.   Answered questions and concerns. PMT contact information given.   Length of Stay: 3  Current  Medications: Scheduled Meds:  . amiodarone  200 mg Oral Daily  . amLODipine  10 mg Oral Daily  . aspirin  81 mg Oral Daily  . Chlorhexidine Gluconate Cloth  6 each Topical Q0600  . cloNIDine  0.1 mg Oral BID  . feeding supplement (ENSURE ENLIVE)  237 mL Oral BID BM  . heparin injection (subcutaneous)  5,000 Units Subcutaneous Q8H  . hydrALAZINE  75 mg Oral Q8H  . metoprolol tartrate  150 mg Oral BID  . multivitamin  1 tablet Oral QHS  . pantoprazole  20 mg Oral Daily  . rosuvastatin  10 mg Oral q1800  . sodium bicarbonate  650 mg  Oral BID    Continuous Infusions:   PRN Meds: acetaminophen **OR** acetaminophen, polyethylene glycol, promethazine  Physical Exam Vitals signs and nursing note reviewed.  Constitutional:      General: He is awake.  HENT:     Head: Normocephalic and atraumatic.  Pulmonary:     Effort: No tachypnea, accessory muscle usage or respiratory distress.  Abdominal:     Tenderness: There is no abdominal tenderness.  Skin:    General: Skin is warm and dry.  Neurological:     Mental Status: He is alert and oriented to person, place, and time.  Psychiatric:        Attention and Perception: Attention normal.        Mood and Affect: Mood normal.        Speech: Speech normal.        Cognition and Memory: Cognition normal.            Vital Signs: BP (!) 131/56 (BP Location: Left Arm)   Pulse (!) 59   Temp (!) 97.5 F (36.4 C) (Oral)   Resp 18   Ht _0  (1.702 m)   Wt 71.4 kg   SpO2 100%   BMI 24.65 kg/m  SpO2: SpO2: 100 % O2 Device: O2 Device: Room Air O2 Flow Rate:    Intake/output summary:   Intake/Output Summary (Last 24 hours) at 11/21/2018 1446 Last data filed at 11/21/2018 1100 Gross per 24 hour  Intake 780 ml  Output 750 ml  Net 30 ml   LBM: Last BM Date: 11/19/18 Baseline Weight: Weight: 75.7 kg Most recent weight: Weight: 71.4 kg       Palliative Assessment/Data:  PPS 50%     Patient Active Problem List   Diagnosis Date  Noted  . Pressure injury of skin 11/19/2018  . ESRD (end stage renal disease) ([place]) 11/19/2018  . Malnutrition of moderate degree 11/19/2018  . Nausea and vomiting 11/18/2018  . Hypoxia 11/09/2018  . Acute pulmonary edema (H[facility])   . Acute on chronic diastolic congestive heart failure ([place])   . Abdominal pain   . Chronic kidney disease (CKD), stage IV (severe) ([place])   . Renal cyst 10/17/2018  . Ventricular tachycardia ([place])   . Decreased activities of daily living (ADL)   . Palliative care by specialist   . 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 Goals of care, counseling/discussion   . Diarrhea   . Dehydration   . Dementia without behavioral disturbance ([place])   . Hypertensive urgency   . Altered mental status   . AKI (acute kidney injury) ([place]) 10/04/2018  . Type 2 diabetes mellitus without complication, without long-term current use of insulin ([place]) 10/30/2017  . Benign essential HTN 10/30/2017  . Hyperlipidemia 10/30/2017    Palliative Care Assessment & Plan   Patient Profile: 78 y.o. male  with past medical history of CKD stage 5 s/p fistula placement 10/20/2018, HTN, DM, subdural hematoma, CAD s/p PCI, HLD, amyloidosis admitted on 11/08/2018 with nausea, vomiting, loose stools, and anorexia. Patient followed by nephrology with plans to start hemodialysis on 11/21/18. Palliative medicine consultation for goals of care.   Assessment: ESRD starting hemodialysis Uremic symptoms Anemia HTN HFpEF CAD s/p PCI  Recommendations/Plan:  PMT provider discussed [place] with patient/daughter previous hospitalization. Please review note from 11/10/2018. At this time, MOST form was completed. Patient wishes include DNR/DNI, limited interventions including BiPAP/CPAP if indicated, ABX and IVF if indicated, and feeding tube  for time defined trial period. Patient has an open, honest relationship with his children and was encouraged to re-visit conversations regarding  his goals and EOL wishes.  Patient hopeful to get on a good routine with hemodialysis and maintain quality of life.   May benefit from outpatient palliative referral. Likely back to SNF to continue rehab.  Code Status: DNR   Code Status Orders  (From admission, onward)         Start     Ordered   11/19/18 0033  Do not attempt resuscitation (DNR)  Continuous    Question Answer Comment  In the event of cardiac or respiratory ARREST Do not call a "code blue"   In the event of cardiac or respiratory ARREST Do not perform Intubation, CPR, defibrillation or ACLS   In the event of cardiac or respiratory ARREST Use medication by any route, position, wound care, and other measures to relive pain and suffering. May use oxygen, suction and manual treatment of airway obstruction as needed for comfort.      11/19/18 0032        Code Status History    Date Active Date Inactive Code Status Order ID Comments User Context   11/09/2018 0711 11/15/2018 1836 DNR [id number]  Richarda Osmond, DO Inpatient   10/06/2018 0219 10/21/2018 2313 Full Code [id number]  Richarda Osmond, DO Inpatient   10/04/2018 2127 10/06/2018 0219 DNR [id number]  Kathrene Alu, MD ED    Advance Directive Documentation     Most Recent Value  Type of Advance Directive  Living will  Pre-existing out of facility DNR order (yellow form or pink MOST form)  -  "MOST" Form in Place?  -       Prognosis:   Unable to determine  Discharge Planning:  [place] for rehab with Palliative care service follow-up  Care plan was discussed with patient  Thank you for allowing the Palliative Medicine Team to assist in the care of this patient.   Time In: 1410 Time Out: 1450 Total Time 40 Prolonged Time Billed  no      Greater than 50%  of this time was spent counseling and coordinating care related to the above assessment and plan.  Ihor Dow, FNP-C Palliative Medicine Team  Phone: [phone] Fax:  [phone]  Please contact Palliative Medicine Team phone at [phone] for questions and concerns.

## 2018-11-21 NOTE — Evaluation (Signed)
Physical Therapy Evaluation Patient Details Name: William [name] MRN: [id number] DOB: 30-Dec-1940 Today's Date: 11/21/2018   History of Present Illness  admission secondary to AMS and had AV fistula placed on 10/20/2018. PMH includes DM, CHF, CAD s/p PCI, DM, and HTN.   Clinical Impression  Pt admitted secondary to problem above with deficits below. Performed eval with OT given poor mobility tolerance. Mobility limited to within the room, and required min guard A. Pt slightly impulsive and presenting with slowed processing throughout. Recommend return to SNF at d/c to continue to improve independence and safety with functional mobility. Will continue to follow acutely.     Follow Up Recommendations SNF;Supervision for mobility/OOB    Equipment Recommendations  None recommended by PT    Recommendations for Other Services       Precautions / Restrictions Precautions Precautions: Fall Restrictions Weight Bearing Restrictions: No      Mobility  Bed Mobility Overal bed mobility: Needs Assistance Bed Mobility: Supine to Sit     Supine to sit: Min guard     General bed mobility comments: Min guard for safety. Increased time required to come to EOB.   Transfers Overall transfer level: Needs assistance Equipment used: None Transfers: Sit to/from Stand Sit to Stand: Min guard         General transfer comment: Min guard for safety. Cues to wait for PT/OT prior to initiating mobility tasks.   Ambulation/Gait Ambulation/Gait assistance: Min guard Gait Distance (Feet): 15 Feet Assistive device: None Gait Pattern/deviations: Step-through pattern;Decreased stride length Gait velocity: decreased   General Gait Details: Slow, cautious gait throughout room. Required min guard A for safety. Poor mobility tolerance, so gait limited to within the room.   Stairs            Wheelchair Mobility    Modified Rankin (Stroke Patients Only)       Balance Overall balance  assessment: Needs assistance Sitting-balance support: No upper extremity supported;Feet supported Sitting balance-Leahy Scale: Fair     Standing balance support: No upper extremity supported;During functional activity Standing balance-Leahy Scale: Fair Standing balance comment: Able to maintain static standing at sink without UE support                              Pertinent Vitals/Pain Pain Assessment: No/denies pain    Home Living Family/patient expects to be discharged to:: Skilled nursing facility                      Prior Function Level of Independence: Needs assistance   Gait / Transfers Assistance Needed: Was working with PT on ambulating with AD.   ADL's / Homemaking Assistance Needed: Pt at SNF prior to admission. Anticipate he required assist with ADL tasks. Was working with OT as well.         Hand Dominance   Dominant Hand: Right    Extremity/Trunk Assessment   Upper Extremity Assessment Upper Extremity Assessment: Defer to OT evaluation    Lower Extremity Assessment Lower Extremity Assessment: Generalized weakness    Cervical / Trunk Assessment Cervical / Trunk Assessment: Kyphotic  Communication   Communication: No difficulties  Cognition Arousal/Alertness: Awake/alert Behavior During Therapy: Impulsive Overall Cognitive Status: Impaired/Different from baseline Area of Impairment: Problem solving;Awareness;Safety/judgement                         Safety/Judgement: Decreased awareness of safety Awareness:  Emergent Problem Solving: Slow processing General Comments: Pt somwhat slow performing ADL/mobility tasks. Pt requiring safety cues to wait for PT/OT to perform mobility tasks.       General Comments General comments (skin integrity, edema, etc.): No family present.     Exercises     Assessment/Plan    PT Assessment Patient needs continued PT services  PT Problem List Decreased strength;Decreased range of  motion;Decreased activity tolerance;Decreased balance;Decreased mobility;Decreased knowledge of use of DME;Decreased safety awareness;Decreased knowledge of precautions;Cardiopulmonary status limiting activity       PT Treatment Interventions DME instruction;Gait training;Functional mobility training;Stair training;Balance training;Therapeutic exercise;Therapeutic activities;Patient/family education    PT Goals (Current goals can be found in the Care Plan section)  Acute Rehab PT Goals Patient Stated Goal: to get better PT Goal Formulation: With patient Time For Goal Achievement: 12/05/18 Potential to Achieve Goals: Good    Frequency Min 2X/week   Barriers to discharge        Co-evaluation PT/OT/SLP Co-Evaluation/Treatment: Yes Reason for Co-Treatment: To address functional/ADL transfers;Complexity of the patient's impairments (multi-system involvement) PT goals addressed during session: Mobility/safety with mobility;Balance         AM-PAC PT "6 Clicks" Mobility  Outcome Measure Help needed turning from your back to your side while in a flat bed without using bedrails?: A Little Help needed moving from lying on your back to sitting on the side of a flat bed without using bedrails?: A Little Help needed moving to and from a bed to a chair (including a wheelchair)?: A Little Help needed standing up from a chair using your arms (e.g., wheelchair or bedside chair)?: A Little Help needed to walk in hospital room?: A Little Help needed climbing 3-5 steps with a railing? : A Lot 6 Click Score: 17    End of Session   Activity Tolerance: Patient limited by fatigue Patient left: in chair;with call bell/phone within reach;with chair alarm set Nurse Communication: Mobility status PT Visit Diagnosis: Unsteadiness on feet (R26.81);Muscle weakness (generalized) (M62.81);Other abnormalities of gait and mobility (R26.89)    Time: 9021-1155 PT Time Calculation (min) (ACUTE ONLY): 20  min   Charges:   PT Evaluation $PT Eval Low Complexity: [place], PT, DPT  Acute Rehabilitation Services  Pager: [phone] Office: [phone]   Rudean Hitt 11/21/2018, 3:49 PM

## 2018-11-21 NOTE — Progress Notes (Signed)
[facility]    NEPHROLOGY PROGRESS NOTE  SUBJECTIVE: Currently without complaints.  Denies chest pain, shortness of breath, nausea, vomiting, diarrhea or dysuria.  Will have second dialysis treatment today.  Was already set up as an outpatient for dialysis that was to begin today.   OBJECTIVE:  Vitals:   11/21/18 0557 11/21/18 0856  BP: (!) 116/50 (!) 131/56  Pulse: (!) 59 (!) 59  Resp:  18  Temp:  (!) 97.5 F (36.4 C)  SpO2: 100% 100%    Intake/Output Summary (Last 24 hours) at 11/21/2018 1319 Last data filed at 11/21/2018 1100 Gross per 24 hour  Intake 1020 ml  Output 750 ml  Net 270 ml      Genearl:  AAOx3 NAD HEENT: MMM [place] AT anicteric sclera Neck:  No JVD, no adenopathy CV:  Heart RRR  Lungs:  L/S CTA bilaterally Abd:  abd SNT/ND with normal BS GU:  Bladder non-palpable Extremities:  No LE edema.  Right upper extremity AV fistula Skin:  No skin rash  MEDICATIONS:  . amiodarone  200 mg Oral Daily  . amLODipine  10 mg Oral Daily  . aspirin  81 mg Oral Daily  . Chlorhexidine Gluconate Cloth  6 each Topical Q0600  . cloNIDine  0.1 mg Oral BID  . feeding supplement (ENSURE ENLIVE)  237 mL Oral BID BM  . heparin injection (subcutaneous)  5,000 Units Subcutaneous Q8H  . hydrALAZINE  75 mg Oral Q8H  . metoprolol tartrate  150 mg Oral BID  . multivitamin  1 tablet Oral QHS  . pantoprazole  20 mg Oral Daily  . rosuvastatin  10 mg Oral q1800  . sodium bicarbonate  650 mg Oral BID       LABS:   CBC Latest Ref Rng & Units 11/19/2018 11/18/2018 11/14/2018  WBC 4.0 - 10.5 K/uL 7.3 9.2 6.1  Hemoglobin 13.0 - 17.0 g/dL 11.7(L) 13.1 11.1(L)  Hematocrit 39.0 - 52.0 % 35.8(L) 41.1 33.0(L)  Platelets 150 - 400 K/uL 399 384 320    CMP Latest Ref Rng & Units 11/21/2018 11/19/2018 11/18/2018  Glucose 70 - 99 mg/dL 127(H) 106(H) 130(H)  BUN 8 - 23 mg/dL 30(H) 37(H) 39(H)  Creatinine 0.61 - 1.24 mg/dL 5.52(H) 5.67(H) 5.87(H)  Sodium 135 - 145 mmol/L 137 136 136   Potassium 3.5 - 5.1 mmol/L 3.9 3.8 4.3  Chloride 98 - 111 mmol/L 102 101 99  CO2 22 - 32 mmol/L 26 24 22   Calcium 8.9 - 10.3 mg/dL 8.3(L) 8.7(L) 9.3  Total Protein 6.5 - 8.1 g/dL - - -  Total Bilirubin 0.3 - 1.2 mg/dL - - -  Alkaline Phos 38 - 126 U/L - - -  AST 15 - 41 U/L - - -  ALT 0 - 44 U/L - - -    Lab Results  Component Value Date   PTH 141 (H) 11/20/2018   CALCIUM 8.3 (L) 11/21/2018   PHOS 3.7 11/21/2018       Component Value Date/Time   COLORURINE STRAW (A) 11/19/2018 0026   APPEARANCEUR CLEAR 11/19/2018 0026   LABSPEC 1.008 11/19/2018 0026   PHURINE 8.0 11/19/2018 0026   GLUCOSEU NEGATIVE 11/19/2018 0026   HGBUR NEGATIVE 11/19/2018 0026   BILIRUBINUR NEGATIVE 11/19/2018 0026   KETONESUR NEGATIVE 11/19/2018 0026   PROTEINUR 100 (A) 11/19/2018 0026   NITRITE NEGATIVE 11/19/2018 0026   LEUKOCYTESUR NEGATIVE 11/19/2018 0026      Component Value Date/Time   HCO3 26.3 (H) 03/30/2007 0201  TCO2 28 03/30/2007 0201    No results found for: IRON, TIBC, FERRITIN, IRONPCTSAT     ASSESSMENT/PLAN:    1.  End-stage renal disease.  We will begin hemodialysis for uremic symptoms.  First dialysis was on Saturday for 2 hours.  We will plan second dialysis treatment on Monday for 3 hours.  Was already set up as an outpatient for dialysis.  Social work to coordinate discharge with the dialysis unit and his destination.  2.  Uremia.  As above, now on dialysis dialysis via right upper extremity AV fistula.  3.  Type 2 diabetes.  Overall blood sugar control is reasonable.  Continue current therapy.  4.  Heart failure with preserved ejection fraction.  This should be easily managed on dialysis.  Ejection fraction from 12/29 was 65 to 70%.  5.  Complete heart block status post pacemaker.  6.  BMD.    Phosphorus control excellent.  7.  Anemia.  Hemoglobin is above goal.  We will continue to monitor for need for ESA.      [place], DO, [place]

## 2018-11-21 NOTE — Care Management Important Message (Signed)
Important Message  Patient Details  Name: William [name] MRN: [id number] Date of Birth: 1941-10-07   Medicare Important Message Given:  Yes    [name] 11/21/2018, 4:14 PM

## 2018-11-21 NOTE — Evaluation (Signed)
Occupational Therapy Evaluation Patient Details Name: William [name] MRN: [id number] DOB: [birth year] Today's Date: 11/21/2018    History of Present Illness Pt is a 78 y/o male admission secondary to AMS and nausea/vomiting and had AV fistula placed on 10/20/2018. Recently discharged from [facility] to SNF and this is a readmission. PMH includes DM, CHF, CAD s/p PCI, DM, and HTN.    Clinical Impression   Pt familiar to therapist from previous admission. Pt is currently min guard assist for transfers, sink level grooming, and ADL. He was impulsive and has decreased safety awareness - limited recall of what he was working on with therapy at the SNF. OT will follow acutely and recommend return to SNF to maximize safety and independence in ADL and functional transfers.     Follow Up Recommendations  SNF;Supervision/Assistance - 24 hour    Equipment Recommendations  Other (comment)(defer to next venue of care)    Recommendations for Other Services       Precautions / Restrictions Precautions Precautions: Fall Restrictions Weight Bearing Restrictions: No      Mobility Bed Mobility Overal bed mobility: Needs Assistance Bed Mobility: Supine to Sit     Supine to sit: Min guard     General bed mobility comments: Min guard for safety. Increased time required to come to EOB.   Transfers Overall transfer level: Needs assistance Equipment used: None Transfers: Sit to/from Stand Sit to Stand: Min guard         General transfer comment: Min guard for safety. Cues to wait for PT/OT prior to initiating mobility tasks.     Balance Overall balance assessment: Needs assistance Sitting-balance support: No upper extremity supported;Feet supported Sitting balance-Leahy Scale: Fair     Standing balance support: No upper extremity supported;During functional activity Standing balance-Leahy Scale: Fair Standing balance comment: Able to maintain static standing at sink without UE support                            ADL either performed or assessed with clinical judgement   ADL Overall ADL's : Needs assistance/impaired     Grooming: Wash/dry hands;Wash/dry face;Oral care;Min guard;Standing Grooming Details (indicate cue type and reason): sink level, requires cues for problem solving Upper Body Bathing: Min guard;Sitting   Lower Body Bathing: Min guard;Sitting/lateral leans   Upper Body Dressing : Set up;Sitting   Lower Body Dressing: Minimal assistance;Sit to/from stand   Toilet Transfer: Min guard;Minimal assistance;Ambulation Toilet Transfer Details (indicate cue type and reason): close min guard for safety Toileting- Clothing Manipulation and Hygiene: Min guard;Sit to/from stand       Functional mobility during ADLs: Min guard;Minimal assistance(used furniture for safety - reaches out for support)       Vision Baseline Vision/History: Wears glasses Wears Glasses: At all times Patient Visual Report: No change from baseline       Perception     Praxis      Pertinent Vitals/Pain Pain Assessment: No/denies pain     Hand Dominance Right   Extremity/Trunk Assessment Upper Extremity Assessment Upper Extremity Assessment: Generalized weakness   Lower Extremity Assessment Lower Extremity Assessment: Defer to PT evaluation   Cervical / Trunk Assessment Cervical / Trunk Assessment: Kyphotic   Communication Communication Communication: No difficulties   Cognition Arousal/Alertness: Awake/alert Behavior During Therapy: Impulsive Overall Cognitive Status: Impaired/Different from baseline Area of Impairment: Problem solving;Awareness;Safety/judgement  Safety/Judgement: Decreased awareness of safety Awareness: Emergent Problem Solving: Slow processing General Comments: Pt somwhat slow performing ADL/mobility tasks. Pt requiring safety cues to wait for PT/OT to perform mobility tasks.    General Comments  No  family present this session    Exercises     Shoulder Instructions      Home Living Family/patient expects to be discharged to:: Skilled nursing facility                                        Prior Functioning/Environment Level of Independence: Needs assistance  Gait / Transfers Assistance Needed: Was working with PT on ambulating with AD.  ADL's / Homemaking Assistance Needed: Pt at SNF prior to admission. Anticipate he required assist with ADL tasks. Was working with OT as well.             OT Problem List: Impaired balance (sitting and/or standing);Impaired vision/perception;Decreased cognition;Decreased safety awareness;Decreased knowledge of use of DME or AE      OT Treatment/Interventions: Self-care/ADL training;DME and/or AE instruction;Therapeutic activities;Cognitive remediation/compensation;Patient/family education;Balance training    OT Goals(Current goals can be found in the care plan section) Acute Rehab OT Goals Patient Stated Goal: to get better, get back to [facility] home OT Goal Formulation: With patient Time For Goal Achievement: 12/05/18 Potential to Achieve Goals: Good ADL Goals Pt Will Perform Grooming: with modified independence;standing Pt Will Perform Upper Body Dressing: with modified independence;standing Pt Will Perform Lower Body Dressing: with modified independence;sit to/from stand Pt Will Transfer to Toilet: with modified independence;ambulating Pt Will Perform Toileting - Clothing Manipulation and hygiene: with modified independence;sit to/from stand  OT Frequency: Min 2X/week   Barriers to D/C:            Co-evaluation PT/OT/SLP Co-Evaluation/Treatment: Yes Reason for Co-Treatment: To address functional/ADL transfers;Complexity of the patient's impairments (multi-system involvement) PT goals addressed during session: Mobility/safety with mobility;Balance OT goals addressed during session: ADL's and self-care      AM-PAC  OT "6 Clicks" Daily Activity     Outcome Measure Help from another person eating meals?: None Help from another person taking care of personal grooming?: A Little Help from another person toileting, which includes using toliet, bedpan, or urinal?: A Little Help from another person bathing (including washing, rinsing, drying)?: A Little Help from another person to put on and taking off regular upper body clothing?: A Little Help from another person to put on and taking off regular lower body clothing?: A Little 6 Click Score: 19   End of Session Equipment Utilized During Treatment: Gait belt Nurse Communication: Mobility status  Activity Tolerance: Patient tolerated treatment well Patient left: in chair;with call bell/phone within reach;with chair alarm set  OT Visit Diagnosis: Unsteadiness on feet (R26.81);Other abnormalities of gait and mobility (R26.89);Repeated falls (R29.6);History of falling (Z91.81);Other symptoms and signs involving the nervous system (R29.898);Other symptoms and signs involving cognitive function                Time: 4854-6270 OT Time Calculation (min): 17 min Charges:  OT General Charges $OT Visit: 1 Visit OT Evaluation $OT Eval Low Complexity: [place] OTR/L Acute Rehabilitation Services Pager: [phone] Office: [place] 11/21/2018, 4:03 PM

## 2018-11-21 NOTE — Progress Notes (Signed)
Family Medicine Teaching Service Daily Progress Note Intern Pager: [phone]  Patient name: William [name] Medical record number: [id number] Date of birth: November 21, 1940 Age: 78 y.o. Gender: male  Primary Care Provider: [name], DO Consultants: Neprhology, SW, Palliative Code Status: DNR   Pt Overview and Major Events to Date:  Hospital Day: 4  Admitted: 11/18/2018 for CC: Nausea and Fatigue 11/19/18 : First HD  Assessment and Plan: William [name] is a 78 y.o. male admitted for generalized fatigue, n/v, anorexia consistent with uremia in setting of new ESRD.  His chronic conditions include CAD, HFpEF, heart block, DM II, HTN.   # New ESRD Second dialysis today, 3 hours, and tolerated well.   SW to coordinate discharge with the dialysis unit   Appreciate nephrology recs  # Diet controlled Dibabetes  stable  Continue Crestor 10mg    # HFpEF  Continue   # CAD s/p PCI  ASA 81mg    # V tach, s/p pace maker   Continue home amiodorone 200mg ,   # HTN   Continue home clonidine 0.1mg  BID, hydralazine 75mg  q8 hours, metoprolol 150mg  BID   # FEN . Fluid restriction 1276mL daily   . Electrolytes: manage w/ HD  . Renal/carb modified diet   # DVT Prophylaxis . Heparin   # Disposition . Trending Labs: RFP  . Patient admitted from [facility] SNF  PT/OT to see for recs   SW to call [place], daughter, for updates  . Discharge to nursing home, pending insurance approval. Patient medically ready for discharge.  Subjective  Patient reports doing well overnight. No acute events. He has some lower back pain and bilateral heal pain.   Objective   Vital Signs Intake/Output  Temp:  [97.4 F (36.3 C)-97.9 F (36.6 C)] 97.8 F (36.6 C) (02/09 2043) Pulse Rate:  [57-60] 59 (02/10 0557) Resp:  [17-18] 18 (02/09 2043) BP: (116-147)/(50-81) 116/50 (02/10 0557) SpO2:  [99 %-100 %] 100 % (02/10 0557) Weight:  [71.4 kg] 71.4 kg (02/09 2043)  Filed Weights   11/19/18 1315  11/19/18 1524 11/20/18 2043  Weight: 71.3 kg 71.4 kg 71.4 kg   Intake/Output      02/09 0701 - 02/10 0700 02/10 0701 - 02/11 0700   P.O. 660    Total Intake(mL/kg) 660 (9.2)    Urine (mL/kg/hr) 100 (0.1)    Other     Stool 0    Total Output 100    Net +560         Urine Occurrence 0 x    Stool Occurrence 0 x       Physical Exam  Gen: NAD, alert, non-toxic, well-appearing, lying comfortably in bed Skin: Warm and dry. B/L heels nonerythematous with mild pain to palpation  HEENT: NCAT. CV: Heart sounds distant. No BLEE. Resp: CTAB. No increased WOB Abd: NTND on palpation to all 4 quadrants.  Pos bowel sounds Extremities: moves extremities spontaneously. Warm and well perfused.   Laboratory: Recent Labs  Lab 11/18/18 2055 11/19/18 0617  WBC 9.2 7.3  HGB 13.1 11.7*  HCT 41.1 35.8*  PLT 384 399   Recent Labs  Lab 11/18/18 2055 11/19/18 0617 11/21/18 0432  NA 136 136 137  K 4.3 3.8 3.9  CL 99 101 102  CO2 22 24 26   BUN 39* 37* 30*  CREATININE 5.87* 5.67* 5.52*  CALCIUM 9.3 8.7* 8.3*  GLUCOSE 130* 106* 127*   MRSA neg   Imaging/Diagnostic Tests: No results found.   Current Medications: Scheduled Meds: .  amiodarone  200 mg Oral Daily  . amLODipine  10 mg Oral Daily  . aspirin  81 mg Oral Daily  . Chlorhexidine Gluconate Cloth  6 each Topical Q0600  . cloNIDine  0.1 mg Oral BID  . feeding supplement (ENSURE ENLIVE)  237 mL Oral BID BM  . heparin injection (subcutaneous)  5,000 Units Subcutaneous Q8H  . hydrALAZINE  75 mg Oral Q8H  . metoprolol tartrate  150 mg Oral BID  . multivitamin  1 tablet Oral QHS  . pantoprazole  20 mg Oral Daily  . rosuvastatin  10 mg Oral q1800  . sodium bicarbonate  650 mg Oral BID   PRN Meds: acetaminophen **OR** acetaminophen, polyethylene glycol, promethazine  Wilber Oliphant, MD 11/21/2018, 7:21 AM PGY-1, [place] Intern pager: [phone], text pages welcome

## 2018-11-22 DIAGNOSIS — N19 Unspecified kidney failure: Secondary | ICD-10-CM

## 2018-11-22 LAB — RENAL FUNCTION PANEL
Albumin: 2.6 g/dL — ABNORMAL LOW (ref 3.5–5.0)
Anion gap: 11 (ref 5–15)
BUN: 30 mg/dL — ABNORMAL HIGH (ref 8–23)
CO2: 26 mmol/L (ref 22–32)
Calcium: 8.5 mg/dL — ABNORMAL LOW (ref 8.9–10.3)
Chloride: 100 mmol/L (ref 98–111)
Creatinine, Ser: 5.51 mg/dL — ABNORMAL HIGH (ref 0.61–1.24)
GFR calc Af Amer: 11 mL/min — ABNORMAL LOW (ref >60)
GFR calc non Af Amer: 9 mL/min — ABNORMAL LOW (ref >60)
Glucose, Bld: 107 mg/dL — ABNORMAL HIGH (ref 70–99)
Phosphorus: 3.1 mg/dL (ref 2.5–4.6)
Potassium: 3.9 mmol/L (ref 3.5–5.1)
Sodium: 137 mmol/L (ref 135–145)

## 2018-11-22 LAB — CBC
HCT: 32.7 % — ABNORMAL LOW (ref 39.0–52.0)
Hemoglobin: 10.5 g/dL — ABNORMAL LOW (ref 13.0–17.0)
MCH: 31.2 pg (ref 26.0–34.0)
MCHC: 32.1 g/dL (ref 30.0–36.0)
MCV: 97 fL (ref 80.0–100.0)
Platelets: 350 10*3/uL (ref 150–400)
RBC: 3.37 MIL/uL — ABNORMAL LOW (ref 4.22–5.81)
RDW: 12.3 % (ref 11.5–15.5)
WBC: 7.2 10*3/uL (ref 4.0–10.5)
nRBC: 0 % (ref 0.0–0.2)

## 2018-11-22 MED ORDER — SODIUM CHLORIDE 0.9 % IV SOLN: 100.0000 mL | INTRAVENOUS | Status: DC | PRN

## 2018-11-22 MED ORDER — LIDOCAINE-PRILOCAINE 2.5-2.5 % EX CREA: 1.0000 "application " | TOPICAL_CREAM | CUTANEOUS | Status: DC | PRN

## 2018-11-22 MED ORDER — PENTAFLUOROPROP-TETRAFLUOROETH EX AERO: 1.0000 "application " | INHALATION_SPRAY | CUTANEOUS | Status: DC | PRN

## 2018-11-22 MED ORDER — LIDOCAINE HCL (PF) 1 % IJ SOLN: 5.0000 mL | INTRAMUSCULAR | Status: DC | PRN

## 2018-11-22 MED ORDER — HEPARIN SODIUM (PORCINE) 1000 UNIT/ML DIALYSIS: 1000.0000 [IU] | INTRAMUSCULAR | Status: DC | PRN

## 2018-11-22 NOTE — Progress Notes (Signed)
Patient refused to remove condom cath stated he would start using urinal tomorrow morning.   Marylyn Appenzeller, Tivis Ringer, RN

## 2018-11-22 NOTE — Progress Notes (Signed)
[facility]    NEPHROLOGY PROGRESS NOTE  SUBJECTIVE: Tired after HD at 3am last night due to logistics.     OBJECTIVE:  Vitals:   11/22/18 0603 11/22/18 0638  BP: (!) 179/85 (!) 179/85  Pulse: 67   Resp: 15   Temp: (!) 97.5 F (36.4 C)   SpO2: 99%     Intake/Output Summary (Last 24 hours) at 11/22/2018 0931 Last data filed at 11/22/2018 0900 Gross per 24 hour  Intake 540 ml  Output 1650 ml  Net -1110 ml      Genearl:  AAOx3 NAD HEENT: MMM [place] AT anicteric sclera Neck:  No JVD, no adenopathy CV:  Heart RRR  Lungs:  L/S CTA bilaterally Abd:  abd SNT/ND with normal BS GU:  Bladder non-palpable Extremities:  No LE edema.  Right upper extremity AV fistula dressings Skin:  No skin rash  MEDICATIONS:  . amiodarone  200 mg Oral Daily  . amLODipine  10 mg Oral Daily  . aspirin  81 mg Oral Daily  . Chlorhexidine Gluconate Cloth  6 each Topical Q0600  . cloNIDine  0.1 mg Oral BID  . feeding supplement (ENSURE ENLIVE)  237 mL Oral BID BM  . heparin injection (subcutaneous)  5,000 Units Subcutaneous Q8H  . hydrALAZINE  75 mg Oral Q8H  . metoprolol tartrate  150 mg Oral BID  . multivitamin  1 tablet Oral QHS  . pantoprazole  20 mg Oral Daily  . rosuvastatin  10 mg Oral q1800  . sodium bicarbonate  650 mg Oral BID       LABS:   CBC Latest Ref Rng & Units 11/22/2018 11/19/2018 11/18/2018  WBC 4.0 - 10.5 K/uL 7.2 7.3 9.2  Hemoglobin 13.0 - 17.0 g/dL 10.5(L) 11.7(L) 13.1  Hematocrit 39.0 - 52.0 % 32.7(L) 35.8(L) 41.1  Platelets 150 - 400 K/uL 350 399 384    CMP Latest Ref Rng & Units 11/22/2018 11/21/2018 11/19/2018  Glucose 70 - 99 mg/dL 107(H) 127(H) 106(H)  BUN 8 - 23 mg/dL 30(H) 30(H) 37(H)  Creatinine 0.61 - 1.24 mg/dL 5.51(H) 5.52(H) 5.67(H)  Sodium 135 - 145 mmol/L 137 137 136  Potassium 3.5 - 5.1 mmol/L 3.9 3.9 3.8  Chloride 98 - 111 mmol/L 100 102 101  CO2 22 - 32 mmol/L 26 26 24   Calcium 8.9 - 10.3 mg/dL 8.5(L) 8.3(L) 8.7(L)  Total Protein 6.5 -  8.1 g/dL - - -  Total Bilirubin 0.3 - 1.2 mg/dL - - -  Alkaline Phos 38 - 126 U/L - - -  AST 15 - 41 U/L - - -  ALT 0 - 44 U/L - - -    Lab Results  Component Value Date   PTH 141 (H) 11/20/2018   CALCIUM 8.5 (L) 11/22/2018   PHOS 3.1 11/22/2018       Component Value Date/Time   COLORURINE STRAW (A) 11/19/2018 0026   APPEARANCEUR CLEAR 11/19/2018 0026   LABSPEC 1.008 11/19/2018 0026   PHURINE 8.0 11/19/2018 0026   GLUCOSEU NEGATIVE 11/19/2018 0026   HGBUR NEGATIVE 11/19/2018 0026   BILIRUBINUR NEGATIVE 11/19/2018 0026   KETONESUR NEGATIVE 11/19/2018 0026   PROTEINUR 100 (A) 11/19/2018 0026   NITRITE NEGATIVE 11/19/2018 0026   LEUKOCYTESUR NEGATIVE 11/19/2018 0026      Component Value Date/Time   HCO3 26.3 (H) 03/30/2007 0201   TCO2 28 03/30/2007 0201    No results found for: IRON, TIBC, FERRITIN, IRONPCTSAT     ASSESSMENT/PLAN:    1.  End-stage renal disease.  We began hemodialysis for uremic symptoms.  First dialysis was on Saturday for 2 hours, 2nd 2/11 x 3 hrs.  Plan next dialysis tomorrow as I believe his outpt schedule is MWF (can change if that's not the case).    Was already set up as an outpatient for dialysis.  Social work to coordinate discharge with the dialysis unit and his destination.  2.  Uremia.  As above, now on dialysis dialysis via right upper extremity AV fistula.  3.  Type 2 diabetes.  Overall blood sugar control is reasonable.  Continue current therapy.  4.  Heart failure with preserved ejection fraction.  This should be easily managed on dialysis.  Ejection fraction from 12/29 was 65 to 70%.  5.  Complete heart block status post pacemaker.  6.  BMD.    Phosphorus control excellent.  7.  Anemia.  Hemoglobin is above goal.  We will continue to monitor for need for ESA.  8.  HTN. Controlled generally, a bit high this AM.  Follow.     Jannifer Hick MD

## 2018-11-22 NOTE — Progress Notes (Signed)
HD tx initiated via 17G x 3 w/ some access issues. Access is hard and wide like it may have been infiltrated previous tx but I don't know that it was. Had to stick venous 2x as the first time I got flash and then it stopped. Pulled needle and clot came out w/ it. Pull/push/flush well w/o problem once it got both cannulated successfully VSS Will continue to monitor while on HD tx

## 2018-11-22 NOTE — Clinical Social Work Note (Addendum)
Clinical Social Work Assessment  Patient Details  Name: William [name] MRN: [id number] Date of Birth: September 09, 1941  Date of referral:  11/21/18               Reason for consult:  Discharge Planning                Permission sought to share information with:  Facility Art therapist granted to share information::  Yes, Verbal Permission Granted  Name::     William [name]  Agency::     Relationship::  Daughter  Contact Information:  [phone]  Housing/Transportation Living arrangements for the past 2 months:  Skilled Nursing Facility(From [facility]) Source of Information:  Adult Children - Daughter Child psychotherapist Patient Interpreter Needed:  None Criminal Activity/Legal Involvement Pertinent to Current Situation/Hospitalization:  No - Comment as needed Significant Relationships:  Adult Children Lives with:  Adult Children Do you feel safe going back to the place where you live?  No(Daughter in agreement with patient returning to [facility] to continue rehab) Need for family participation in patient care:  Yes (Comment)  Care giving concerns:  Daughter expressed no concerns regarding patient's care at [facility] and Rehab.  Social Worker assessment / plan: CSW talked with patient briefly and received permission to speak with his daughter. Talked with daughter (2/10) by phone regarding patient's readiness for discharge. Ms. Saulters indicated that her dad will return to [facility]. CSW explained that facility will initiate insurance authorization and he will discharge once auth received, and daughter expressed understanding.  Employment status:  Retired Medical sales representative) PT Recommendations:  [place] / Referral to community resources:  Other (Comment Required)(None needed or requested as patient from [facility] and will return)  Patient/Family's Response to care:  No concerns expressed by daughter or  patient regarding his care during hospitalization.   Patient/Family's Understanding of and Emotional Response to Diagnosis, Current Treatment, and Prognosis:  Daughter expressed understanding of her father's need for continued rehab post discharge.  Emotional Assessment Appearance:  Appears stated age Attitude/Demeanor/Rapport:  Lethargic(Patient appeared to be napping, but awakened when name called) Affect (typically observed):  Appropriate Orientation:  Oriented to Self, Oriented to Place, Oriented to Situation Alcohol / Substance use:  Never Used Psych involvement (Current and /or in the community):  No (Comment)  Discharge Needs  Concerns to be addressed:  Discharge Planning Concerns Readmission within the last 30 days:  Yes Current discharge risk:  None Barriers to Discharge:  [place], [place], [place] 11/22/2018, 12:02 PM

## 2018-11-22 NOTE — Progress Notes (Signed)
Family Medicine Teaching Service Daily Progress Note Intern Pager: [phone]  Patient name: William [name] Medical record number: [id number] Date of birth: 11/01/1940 Age: 78 y.o. Gender: male  Primary Care Provider: [name], DO Consultants: Neprhology, SW, Palliative Code Status: DNR   Pt Overview and Major Events to Date:  Hospital Day: 5  Admitted: 11/18/2018 for CC: Nausea and Fatigue 11/19/18 : First HD  Assessment and Plan: William [name] is a 78 y.o. male admitted uremia in setting of new ESRD.  His chronic conditions include CAD, HFpEF, heart block, DM II, HTN.   # New ESRD + BMD  HD initiated at 3am today.   Plans per nephrology   SW to coordinate discharge with the dialysis unit   #Chronic Anemia, unspcecified  11.7>10.5. Baseline over the last year ~11-12.   Continue to monitor outpatient   # Diet controlled Dibabetes  stable  Continue Crestor 10mg    # HTN + HFpEF  Continue home clonidine 0.1mg  BID, hydralazine 75mg  q8 hours, metoprolol 150mg  BID   # CAD s/p PCI  ASA 81mg    # V tach, complete heart block  s/p pace maker   Continue home amiodorone 200mg  and metoprolol   # FEN . Fluid restriction 1222mL daily   . Electrolytes: manage w/ HD  . Renal/carb modified diet   # VTE Prophylaxis . Heparin   # Disposition . Trending Labs: RFP  . Patient admitted from [facility] SNF  PT/OT to see for recs for SNF . Discharge to nursing home, pending insurance approval. Patient medically ready for discharge.  Subjective  NAEO  Objective   Vital Signs Intake/Output  Temp:  [97.5 F (36.4 C)-98.1 F (36.7 C)] 97.5 F (36.4 C) (02/11 0603) Pulse Rate:  [59-67] 67 (02/11 0603) Resp:  [11-18] 15 (02/11 0603) BP: (128-179)/(49-85) 179/85 (02/11 0638) SpO2:  [99 %-100 %] 99 % (02/11 0603) Weight:  [73.3 kg-74.3 kg] 73.3 kg (02/11 0603)  Filed Weights   11/20/18 2043 11/22/18 0244 11/22/18 0603  Weight: 71.4 kg 74.3 kg 73.3 kg   Intake/Output       02/10 0701 - 02/11 0700 02/11 0701 - 02/12 0700   P.O. 780    Total Intake(mL/kg) 780 (10.6)    Urine (mL/kg/hr) 300 (0.2)    Emesis/NG output 0    Other 1000    Stool 0    Total Output 1300    Net -520         Urine Occurrence 0 x    Stool Occurrence 0 x       Physical Exam  Gen: NAD, alert, non-toxic, well-appearing, lying comfortably in bed Skin: Warm and dry. B/L heels nonerythematous with mild pain to palpation  HEENT: NCAT. CV: Heart sounds distant. No BLEE. Resp: CTAB. No increased WOB Abd: NTND on palpation to all 4 quadrants.  Pos bowel sounds Extremities: moves extremities spontaneously. Warm and well perfused.   Laboratory: Recent Labs  Lab 11/18/18 2055 11/19/18 0617 11/22/18 0310  WBC 9.2 7.3 7.2  HGB 13.1 11.7* 10.5*  HCT 41.1 35.8* 32.7*  PLT 384 399 350   Recent Labs  Lab 11/18/18 2055 11/19/18 0617 11/21/18 0432 11/22/18 0310  NA 136 136 137 137  K 4.3 3.8 3.9 3.9  CL 99 101 102 100  CO2 22 24 26 26   BUN 39* 37* 30* 30*  CREATININE 5.87* 5.67* 5.52* 5.51*  CALCIUM 9.3 8.7* 8.3* 8.5*  GLUCOSE 130* 106* 127* 107*   MRSA neg  Imaging/Diagnostic Tests: No results found.   Wilber Oliphant, MD 11/22/2018, 7:49 AM PGY-1, [place] Intern pager: [phone], text pages welcome

## 2018-11-23 LAB — CBC
HCT: 32.7 % — ABNORMAL LOW (ref 39.0–52.0)
Hemoglobin: 10.4 g/dL — ABNORMAL LOW (ref 13.0–17.0)
MCH: 31.2 pg (ref 26.0–34.0)
MCHC: 31.8 g/dL (ref 30.0–36.0)
MCV: 98.2 fL (ref 80.0–100.0)
Platelets: 342 10*3/uL (ref 150–400)
RBC: 3.33 MIL/uL — ABNORMAL LOW (ref 4.22–5.81)
RDW: 12.2 % (ref 11.5–15.5)
WBC: 9.6 10*3/uL (ref 4.0–10.5)
nRBC: 0 % (ref 0.0–0.2)

## 2018-11-23 LAB — RENAL FUNCTION PANEL
Albumin: 2.6 g/dL — ABNORMAL LOW (ref 3.5–5.0)
Anion gap: 7 (ref 5–15)
BUN: 24 mg/dL — ABNORMAL HIGH (ref 8–23)
CO2: 30 mmol/L (ref 22–32)
Calcium: 8.4 mg/dL — ABNORMAL LOW (ref 8.9–10.3)
Chloride: 101 mmol/L (ref 98–111)
Creatinine, Ser: 4.76 mg/dL — ABNORMAL HIGH (ref 0.61–1.24)
GFR calc Af Amer: 13 mL/min — ABNORMAL LOW (ref >60)
GFR calc non Af Amer: 11 mL/min — ABNORMAL LOW (ref >60)
Glucose, Bld: 111 mg/dL — ABNORMAL HIGH (ref 70–99)
Phosphorus: 2.8 mg/dL (ref 2.5–4.6)
Potassium: 4.2 mmol/L (ref 3.5–5.1)
Sodium: 138 mmol/L (ref 135–145)

## 2018-11-23 MED ORDER — HEPARIN SODIUM (PORCINE) 1000 UNIT/ML IJ SOLN
INTRAMUSCULAR | Status: AC
  Filled 2018-11-23: qty 1

## 2018-11-23 NOTE — Clinical Social Work Note (Addendum)
Received information regarding patient's outpatient dialysis treatment. Patient set-up at [facility], MWF at 1:10 pm. Information provided to patient and daughter. Patient may need assistance with transportation to dialysis. CSW will follow-up with his daughter. [name] will discharge back to [facility] once insurance authorization received.  William [name], MSW, LCSW Licensed Clinical Social Worker [place] [phone]

## 2018-11-23 NOTE — NC FL2 (Signed)
[place] LEVEL OF CARE SCREENING TOOL     IDENTIFICATION  Patient Name: William [name] Birthdate: [birth year] Sex: male Admission Date (Current Location): 11/18/2018  [facility] and [place] Number:  Herbalist and Address:  [facility], [place] [address], [place], [place] [address]      Provider Number: [id number]  Attending Physician Name and Address:  Martyn Malay, MD  Relative Name and Phone Number:  Cardell Rachel - daughter, [phone]    Current Level of Care: Hospital Recommended Level of Care: Skilled Nursing Facility([facility]) Prior Approval Number:    Date Approved/Denied:   PASRR Number: [id number](Eff. 10/07/18)  Discharge Plan: SNF    Current Diagnoses: Patient Active Problem List   Diagnosis Date Noted  . Uremia syndrome   . Pressure injury of skin 11/19/2018  . ESRD (end stage renal disease) ([place]) 11/19/2018  . Malnutrition of moderate degree 11/19/2018  . Nausea and vomiting 11/18/2018  . Hypoxia 11/09/2018  . Acute pulmonary edema (HCC)   . Acute on chronic diastolic congestive heart failure ([place])   . Abdominal pain   . Chronic kidney disease (CKD), stage IV (severe) ([place])   . Renal cyst 10/17/2018  . Ventricular tachycardia ([place])   . Decreased activities of daily living (ADL)   . Palliative care by specialist   . 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 Goals of care, counseling/discussion   . Diarrhea   . Dehydration   . Dementia without behavioral disturbance ([place])   . Hypertensive urgency   . Altered mental status   . AKI (acute kidney injury) ([place]) 10/04/2018  . Type 2 diabetes mellitus without complication, without long-term current use of insulin ([place]) 10/30/2017  . Benign essential HTN 10/30/2017  . Hyperlipidemia 10/30/2017    Orientation RESPIRATION BLADDER Height & Weight     Self, Situation, Place  Normal Incontinent, External  catheter(Catheter placed 2/7) Weight: 161 lb 2.5 oz (73.1 kg) Height:  5\' 7"  (170.2 cm)  BEHAVIORAL SYMPTOMS/MOOD NEUROLOGICAL BOWEL NUTRITION STATUS      Continent Diet  AMBULATORY STATUS COMMUNICATION OF NEEDS Skin   Limited Assist(Min Guard per PT) Verbally Other (Comment)(Stage 2 pressure injury to mid-sacrum with foam dressing; Fissure medial sacrum with foam dressing; MASD to mid sacrumm)                       Personal Care Assistance Level of Assistance  Bathing, Feeding, Dressing Bathing Assistance: Limited assistance(Min Guard per OT) Feeding assistance: Independent(Assistance with set-up) Dressing Assistance: Limited assistance(Min assist per OT)     Functional Limitations Info    Sight Info: Impaired(Wears glasses) Hearing Info: Impaired Speech Info: Impaired    SPECIAL CARE FACTORS FREQUENCY  PT (By licensed PT), OT (By licensed OT)     PT Frequency: PT eval 2/10. PT at SNF Eval and Treat OT Frequency: OT eval 2/10. OT at SNF Eval and Treat            Contractures Contractures Info: Not present    Additional Factors Info  Code Status Code Status Info: DNR Allergies Info: Carvedilol           Current Medications (11/23/2018):  This is the current hospital active medication list Current Facility-Administered Medications  Medication Dose Route Frequency Provider Last Rate Last Dose  . 0.9 %  sodium chloride infusion  100 mL Intravenous PRN Finnigan, Nancy A, DO      .  0.9 %  sodium chloride infusion  100 mL Intravenous PRN Finnigan, Nancy A, DO      . acetaminophen (TYLENOL) tablet 650 mg  650 mg Oral Q6H PRN Benay Pike, MD   650 mg at 11/22/18 2235   Or  . acetaminophen (TYLENOL) suppository 650 mg  650 mg Rectal Q6H PRN Benay Pike, MD      . amiodarone (PACERONE) tablet 200 mg  200 mg Oral Daily Benay Pike, MD   200 mg at 11/22/18 0350  . amLODipine (NORVASC) tablet 10 mg  10 mg Oral Daily Benay Pike, MD   10 mg at 11/22/18 0938   . aspirin chewable tablet 81 mg  81 mg Oral Daily Mullis, Kiersten P, DO   81 mg at 11/22/18 1829  . Chlorhexidine Gluconate Cloth 2 % PADS 6 each  6 each Topical Q0600 Finnigan, Izora Gala A, DO   6 each at 11/22/18 [phone]  . cloNIDine (CATAPRES) tablet 0.1 mg  0.1 mg Oral BID Benay Pike, MD   0.1 mg at 11/22/18 2234  . feeding supplement (ENSURE ENLIVE) (ENSURE ENLIVE) liquid 237 mL  237 mL Oral BID BM Martyn Malay, MD   237 mL at 11/22/18 1443  . heparin 1000 UNIT/ML injection           . heparin injection 1,000 Units  1,000 Units Dialysis PRN Finnigan, Nancy A, DO      . heparin injection 5,000 Units  5,000 Units Subcutaneous Q8H Rory Percy, DO   5,000 Units at 11/23/18 0551  . hydrALAZINE (APRESOLINE) tablet 75 mg  75 mg Oral Q8H Benay Pike, MD   75 mg at 11/22/18 2234  . lidocaine (PF) (XYLOCAINE) 1 % injection 5 mL  5 mL Intradermal PRN Finnigan, Nancy A, DO      . lidocaine-prilocaine (EMLA) cream 1 application  1 application Topical PRN Finnigan, Nancy A, DO      . metoprolol tartrate (LOPRESSOR) tablet 150 mg  150 mg Oral BID Benay Pike, MD   150 mg at 11/22/18 2234  . multivitamin (RENA-VIT) tablet 1 tablet  1 tablet Oral QHS Martyn Malay, MD   1 tablet at 11/22/18 2235  . pantoprazole (PROTONIX) EC tablet 20 mg  20 mg Oral Daily Benay Pike, MD   20 mg at 11/22/18 6967  . pentafluoroprop-tetrafluoroeth (GEBAUERS) aerosol 1 application  1 application Topical PRN Finnigan, Nancy A, DO      . polyethylene glycol (MIRALAX / GLYCOLAX) packet 17 g  17 g Oral Daily PRN Benay Pike, MD      . promethazine (PHENERGAN) tablet 12.5 mg  12.5 mg Oral Q6H PRN Benay Pike, MD      . rosuvastatin (CRESTOR) tablet 10 mg  10 mg Oral q1800 Benay Pike, MD   10 mg at 11/22/18 1730  . sodium bicarbonate tablet 650 mg  650 mg Oral BID Benay Pike, MD   650 mg at 11/22/18 2234     Discharge Medications: Please see discharge summary for a list of discharge  medications.  Relevant Imaging Results:  Relevant Lab Results:   Additional Information ss#[id number]. Dialysis [facility] - MWF at 1:10 pm  Sable Feil, LCSW

## 2018-11-23 NOTE — Progress Notes (Signed)
Patient wanted the condom catheter taken off by NT. NT removed condom catheter. Patient is now going to use the urinal.   Mickel Baas B. RN

## 2018-11-23 NOTE — Progress Notes (Signed)
Family Medicine Teaching Service Daily Progress Note Intern Pager: [phone]  Patient name: OAKLYN MANS Medical record number: [id number] Date of birth: 02-May-1941 Age: 78 y.o. Gender: male  Primary Care Provider: [name], DO Consultants: Neprhology, SW, Palliative Code Status: DNR   Pt Overview and Major Events to Date:  Hospital Day: 6  Admitted: 11/18/2018 for CC: Nausea and Fatigue 11/19/18 : First HD  Assessment and Plan: WYLDER MACOMBER is a 78 y.o. male admitted uremia in setting of new ESRD.  His chronic conditions include CAD, HFpEF, heart block, DM II, HTN.   # New ESRD + BMD  HD this AM. Pt seen at HD.   Plans per nephrology   SW to coordinate discharge with the dialysis unit   #Chronic Anemia, unspcecified  Stable 10.5>10.4. Baseline over the last year ~11-12.   Continue to monitor outpatient   # Diet controlled Dibabetes  stable  Continue Crestor 10mg    # HTN + HFpEF  Continue home clonidine 0.1mg  BID, hydralazine 75mg  q8 hours, metoprolol 150mg  BID   # CAD s/p PCI  ASA 81mg    # V tach, complete heart block  s/p pace maker   Continue home amiodorone 200mg  and metoprolol   # FEN . Fluid restriction 1266mL daily   . Electrolytes: manage w/ HD  . Renal/carb modified diet   # VTE Prophylaxis . Heparin   # Disposition . Trending Labs: none, CBC, BPM stable 2/12 . Patient admitted from [facility] SN[facility]  PT/OT to see for recs for SNF . Discharge to nursing home, pending insurance approval. Patient medically ready for discharge.  Subjective  NAEO. Pt refused to remove condom cath overnight and again this morning .  Objective   Vital Signs Intake/Output  Temp:  [97.4 F (36.3 C)-99 F (37.2 C)] 98.7 F (37.1 C) (02/12 1140) Pulse Rate:  [60-64] 60 (02/12 1140) Resp:  [16-18] 18 (02/12 1140) BP: (86-144)/(51-66) 124/57 (02/12 1140) SpO2:  [97 %-100 %] 97 % (02/12 1140) Weight:  [72.6 kg-73.1 kg] 73.1 kg (02/12 0714)  Filed Weights    11/22/18 2234 11/23/18 0500 11/23/18 0714  Weight: 72.6 kg 72.6 kg 73.1 kg   Intake/Output      02/11 0701 - 02/12 0700 02/12 0701 - 02/13 0700   P.O. 300 300   Total Intake(mL/kg) 300 (4.1) 300 (4.1)   Urine (mL/kg/hr) 350 (0.2) 300 (0.5)   Emesis/NG output     Other  805   Stool 0    Total Output 350 1105   Net -50 -805        Urine Occurrence 0 x    Stool Occurrence 0 x    Emesis Occurrence 0 x       Physical Exam  Gen: Lying in HD bed with blankets covering face and body  CV: No BLEE. Resp:. No increased WOB Abd: NTND on palpation to all 4 quadrants.   Extremities: moves extremities spontaneously. Warm and well perfused.   Laboratory: Recent Labs  Lab 11/18/18 2055 11/19/18 0617 11/22/18 0310 11/23/18 0750  WBC 9.2 7.3 7.2 9.6  HGB 13.1 11.7* 10.5* 10.4*  HCT 41.1 35.8* 32.7* 32.7*  PLT 384 399 350 342   Recent Labs  Lab 11/18/18 2055 11/19/18 0617 11/21/18 0432 11/22/18 0310 11/23/18 0750  NA 136 136 137 137 138  K 4.3 3.8 3.9 3.9 4.2  CL 99 101 102 100 101  CO2 22 24 26 26 30   BUN 39* 37* 30* 30* 24*  CREATININE  5.87* 5.67* 5.52* 5.51* 4.76*  CALCIUM 9.3 8.7* 8.3* 8.5* 8.4*  GLUCOSE 130* 106* 127* 107* 111*   MRSA neg   Imaging/Diagnostic Tests: No results found.   Wilber Oliphant, MD 11/23/2018, 2:40 PM PGY-1, [place] Intern pager: [phone], text pages welcome

## 2018-11-23 NOTE — Progress Notes (Signed)
Patient is refusing to take his condom catheter off at this time. Will continue to monitor.   [name] RN

## 2018-11-23 NOTE — Progress Notes (Signed)
[name]    NEPHROLOGY PROGRESS NOTE  SUBJECTIVE: Dialyzing this AM.  Tells me he doesn't know what is going on, 'just wants treatment.'  No particular complaints.   OBJECTIVE:  Vitals:   11/23/18 0730 11/23/18 0800  BP: 124/66 113/62  Pulse: 60 60  Resp:    Temp:    SpO2:      Intake/Output Summary (Last 24 hours) at 11/23/2018 0932 Last data filed at 11/23/2018 0601 Gross per 24 hour  Intake 300 ml  Output 350 ml  Net -50 ml      Genearl:  AAOx3 NAD HEENT: MMM [place] AT anicteric sclera Neck:  No JVD CV:  Heart RRR  Lungs:  L/S CTA bilaterally Abd:  abd SNT/ND with normal BS GU:  Bladder non-palpable Extremities:  No LE edema.  Right upper extremity AV fistula with needles in place Skin:  No skin rash  MEDICATIONS:  . heparin      . amiodarone  200 mg Oral Daily  . amLODipine  10 mg Oral Daily  . aspirin  81 mg Oral Daily  . Chlorhexidine Gluconate Cloth  6 each Topical Q0600  . cloNIDine  0.1 mg Oral BID  . feeding supplement (ENSURE ENLIVE)  237 mL Oral BID BM  . heparin injection (subcutaneous)  5,000 Units Subcutaneous Q8H  . hydrALAZINE  75 mg Oral Q8H  . metoprolol tartrate  150 mg Oral BID  . multivitamin  1 tablet Oral QHS  . pantoprazole  20 mg Oral Daily  . rosuvastatin  10 mg Oral q1800  . sodium bicarbonate  650 mg Oral BID       LABS:   CBC Latest Ref Rng & Units 11/23/2018 11/22/2018 11/19/2018  WBC 4.0 - 10.5 K/uL 9.6 7.2 7.3  Hemoglobin 13.0 - 17.0 g/dL 10.4(L) 10.5(L) 11.7(L)  Hematocrit 39.0 - 52.0 % 32.7(L) 32.7(L) 35.8(L)  Platelets 150 - 400 K/uL 342 350 399    CMP Latest Ref Rng & Units 11/23/2018 11/22/2018 11/21/2018  Glucose 70 - 99 mg/dL 111(H) 107(H) 127(H)  BUN 8 - 23 mg/dL 24(H) 30(H) 30(H)  Creatinine 0.61 - 1.24 mg/dL 4.76(H) 5.51(H) 5.52(H)  Sodium 135 - 145 mmol/L 138 137 137  Potassium 3.5 - 5.1 mmol/L 4.2 3.9 3.9  Chloride 98 - 111 mmol/L 101 100 102  CO2 22 - 32 mmol/L 30 26 26   Calcium 8.9 - 10.3  mg/dL 8.4(L) 8.5(L) 8.3(L)  Total Protein 6.5 - 8.1 g/dL - - -  Total Bilirubin 0.3 - 1.2 mg/dL - - -  Alkaline Phos 38 - 126 U/L - - -  AST 15 - 41 U/L - - -  ALT 0 - 44 U/L - - -    Lab Results  Component Value Date   PTH 141 (H) 11/20/2018   CALCIUM 8.4 (L) 11/23/2018   PHOS 2.8 11/23/2018       Component Value Date/Time   COLORURINE STRAW (A) 11/19/2018 0026   APPEARANCEUR CLEAR 11/19/2018 0026   LABSPEC 1.008 11/19/2018 0026   PHURINE 8.0 11/19/2018 0026   GLUCOSEU NEGATIVE 11/19/2018 0026   HGBUR NEGATIVE 11/19/2018 0026   BILIRUBINUR NEGATIVE 11/19/2018 0026   KETONESUR NEGATIVE 11/19/2018 0026   PROTEINUR 100 (A) 11/19/2018 0026   NITRITE NEGATIVE 11/19/2018 0026   LEUKOCYTESUR NEGATIVE 11/19/2018 0026      Component Value Date/Time   HCO3 26.3 (H) 03/30/2007 0201   TCO2 28 03/30/2007 0201    No results found for: IRON, TIBC, FERRITIN, IRONPCTSAT  ASSESSMENT/PLAN:    1.  End-stage renal disease.  We began hemodialysis for uremic symptoms.  First dialysis was on Saturday 2/9.  Currently on MWF schedule which I believe is his outpatient schedule.   Social work to coordinate discharge with the dialysis unit and his destination.  2.  Uremia.  As above, now on dialysis dialysis via right upper extremity AV fistula.  3.  Type 2 diabetes.  Overall blood sugar control is reasonable.  Continue current therapy.  4.  Heart failure with preserved ejection fraction.  This should be easily managed on dialysis.  Ejection fraction from 12/29 was 65 to 70%.  5.  Complete heart block status post pacemaker.  6.  BMD.    Phosphorus control excellent.  7.  Anemia.  Hemoglobin is at goal.  We will continue to monitor for need for ESA.  8.  HTN. Controlled generally.    Jannifer Hick MD

## 2018-11-24 DIAGNOSIS — N186 End stage renal disease: Secondary | ICD-10-CM | POA: Diagnosis not present

## 2018-11-24 DIAGNOSIS — Z992 Dependence on renal dialysis: Secondary | ICD-10-CM | POA: Diagnosis not present

## 2018-11-24 DIAGNOSIS — N2581 Secondary hyperparathyroidism of renal origin: Secondary | ICD-10-CM | POA: Diagnosis not present

## 2018-11-24 DIAGNOSIS — D509 Iron deficiency anemia, unspecified: Secondary | ICD-10-CM | POA: Diagnosis not present

## 2018-11-24 DIAGNOSIS — R2689 Other abnormalities of gait and mobility: Secondary | ICD-10-CM | POA: Diagnosis not present

## 2018-11-24 DIAGNOSIS — E1122 Type 2 diabetes mellitus with diabetic chronic kidney disease: Secondary | ICD-10-CM | POA: Diagnosis not present

## 2018-11-24 DIAGNOSIS — I5033 Acute on chronic diastolic (congestive) heart failure: Secondary | ICD-10-CM | POA: Diagnosis not present

## 2018-11-24 DIAGNOSIS — D688 Other specified coagulation defects: Secondary | ICD-10-CM | POA: Diagnosis not present

## 2018-11-24 DIAGNOSIS — R112 Nausea with vomiting, unspecified: Secondary | ICD-10-CM | POA: Diagnosis not present

## 2018-11-24 DIAGNOSIS — I5031 Acute diastolic (congestive) heart failure: Secondary | ICD-10-CM | POA: Diagnosis not present

## 2018-11-24 DIAGNOSIS — E7849 Other hyperlipidemia: Secondary | ICD-10-CM | POA: Diagnosis not present

## 2018-11-24 DIAGNOSIS — F0390 Unspecified dementia without behavioral disturbance: Secondary | ICD-10-CM | POA: Diagnosis not present

## 2018-11-24 DIAGNOSIS — I10 Essential (primary) hypertension: Secondary | ICD-10-CM | POA: Diagnosis not present

## 2018-11-24 DIAGNOSIS — R41841 Cognitive communication deficit: Secondary | ICD-10-CM | POA: Diagnosis not present

## 2018-11-24 DIAGNOSIS — R001 Bradycardia, unspecified: Secondary | ICD-10-CM | POA: Diagnosis not present

## 2018-11-24 DIAGNOSIS — N179 Acute kidney failure, unspecified: Secondary | ICD-10-CM | POA: Diagnosis not present

## 2018-11-24 DIAGNOSIS — R4189 Other symptoms and signs involving cognitive functions and awareness: Secondary | ICD-10-CM | POA: Diagnosis not present

## 2018-11-24 DIAGNOSIS — I472 Ventricular tachycardia: Secondary | ICD-10-CM | POA: Diagnosis not present

## 2018-11-24 DIAGNOSIS — R279 Unspecified lack of coordination: Secondary | ICD-10-CM | POA: Diagnosis not present

## 2018-11-24 DIAGNOSIS — E119 Type 2 diabetes mellitus without complications: Secondary | ICD-10-CM | POA: Diagnosis not present

## 2018-11-24 DIAGNOSIS — Z23 Encounter for immunization: Secondary | ICD-10-CM | POA: Diagnosis not present

## 2018-11-24 DIAGNOSIS — N19 Unspecified kidney failure: Secondary | ICD-10-CM | POA: Diagnosis not present

## 2018-11-24 DIAGNOSIS — M6281 Muscle weakness (generalized): Secondary | ICD-10-CM | POA: Diagnosis not present

## 2018-11-24 DIAGNOSIS — R278 Other lack of coordination: Secondary | ICD-10-CM | POA: Diagnosis not present

## 2018-11-24 DIAGNOSIS — D631 Anemia in chronic kidney disease: Secondary | ICD-10-CM | POA: Diagnosis not present

## 2018-11-24 DIAGNOSIS — N185 Chronic kidney disease, stage 5: Secondary | ICD-10-CM | POA: Diagnosis not present

## 2018-11-24 DIAGNOSIS — Z743 Need for continuous supervision: Secondary | ICD-10-CM | POA: Diagnosis not present

## 2018-11-24 DIAGNOSIS — R69 Illness, unspecified: Secondary | ICD-10-CM | POA: Diagnosis not present

## 2018-11-24 DIAGNOSIS — R1111 Vomiting without nausea: Secondary | ICD-10-CM | POA: Diagnosis not present

## 2018-11-24 DIAGNOSIS — R110 Nausea: Secondary | ICD-10-CM | POA: Diagnosis not present

## 2018-11-24 DIAGNOSIS — E440 Moderate protein-calorie malnutrition: Secondary | ICD-10-CM | POA: Diagnosis not present

## 2018-11-24 MED ORDER — RENA-VITE PO TABS: 1.0000 | ORAL_TABLET | Freq: Every day | ORAL | 0 refills | Status: DC

## 2018-11-24 MED ORDER — ASPIRIN 81 MG PO CHEW: 81.0000 mg | CHEWABLE_TABLET | Freq: Every day | ORAL | 1 refills | Status: AC

## 2018-11-24 MED ORDER — CHLORHEXIDINE GLUCONATE CLOTH 2 % EX PADS: 6.0000 | MEDICATED_PAD | Freq: Every day | CUTANEOUS | Status: DC

## 2018-11-24 NOTE — Progress Notes (Signed)
[facility]    NEPHROLOGY PROGRESS NOTE  SUBJECTIVE: Sleeping this AM, arouses to voice. No particular complaints.   OBJECTIVE:  Vitals:   11/24/18 0610 11/24/18 0940  BP: (!) 129/54 (!) 117/51  Pulse: 64 (!) 59  Resp:  18  Temp: 98.6 F (37 C) 98.8 F (37.1 C)  SpO2: 100% 100%    Intake/Output Summary (Last 24 hours) at 11/24/2018 1346 Last data filed at 11/24/2018 1100 Gross per 24 hour  Intake 420 ml  Output 100 ml  Net 320 ml      Genearl:  AAOx3 NAD HEENT: MMM [place] AT anicteric sclera Neck:  No JVD CV:  Heart RRR  Lungs:  L/S CTA bilaterally Abd:  abd SNT/ND with normal BS Extremities:  No LE edema.  Right upper extremity AV fistula t/b+ Skin:  No skin rash  MEDICATIONS:  . amiodarone  200 mg Oral Daily  . amLODipine  10 mg Oral Daily  . aspirin  81 mg Oral Daily  . Chlorhexidine Gluconate Cloth  6 each Topical Q0600  . cloNIDine  0.1 mg Oral BID  . feeding supplement (ENSURE ENLIVE)  237 mL Oral BID BM  . heparin injection (subcutaneous)  5,000 Units Subcutaneous Q8H  . hydrALAZINE  75 mg Oral Q8H  . metoprolol tartrate  150 mg Oral BID  . multivitamin  1 tablet Oral QHS  . pantoprazole  20 mg Oral Daily  . rosuvastatin  10 mg Oral q1800  . sodium bicarbonate  650 mg Oral BID       LABS:   CBC Latest Ref Rng & Units 11/23/2018 11/22/2018 11/19/2018  WBC 4.0 - 10.5 K/uL 9.6 7.2 7.3  Hemoglobin 13.0 - 17.0 g/dL 10.4(L) 10.5(L) 11.7(L)  Hematocrit 39.0 - 52.0 % 32.7(L) 32.7(L) 35.8(L)  Platelets 150 - 400 K/uL 342 350 399    CMP Latest Ref Rng & Units 11/23/2018 11/22/2018 11/21/2018  Glucose 70 - 99 mg/dL 111(H) 107(H) 127(H)  BUN 8 - 23 mg/dL 24(H) 30(H) 30(H)  Creatinine 0.61 - 1.24 mg/dL 4.76(H) 5.51(H) 5.52(H)  Sodium 135 - 145 mmol/L 138 137 137  Potassium 3.5 - 5.1 mmol/L 4.2 3.9 3.9  Chloride 98 - 111 mmol/L 101 100 102  CO2 22 - 32 mmol/L 30 26 26   Calcium 8.9 - 10.3 mg/dL 8.4(L) 8.5(L) 8.3(L)  Total Protein 6.5 - 8.1 g/dL - -  -  Total Bilirubin 0.3 - 1.2 mg/dL - - -  Alkaline Phos 38 - 126 U/L - - -  AST 15 - 41 U/L - - -  ALT 0 - 44 U/L - - -    Lab Results  Component Value Date   PTH 141 (H) 11/20/2018   CALCIUM 8.4 (L) 11/23/2018   PHOS 2.8 11/23/2018       Component Value Date/Time   COLORURINE STRAW (A) 11/19/2018 0026   APPEARANCEUR CLEAR 11/19/2018 0026   LABSPEC 1.008 11/19/2018 0026   PHURINE 8.0 11/19/2018 0026   GLUCOSEU NEGATIVE 11/19/2018 0026   HGBUR NEGATIVE 11/19/2018 0026   BILIRUBINUR NEGATIVE 11/19/2018 0026   KETONESUR NEGATIVE 11/19/2018 0026   PROTEINUR 100 (A) 11/19/2018 0026   NITRITE NEGATIVE 11/19/2018 0026   LEUKOCYTESUR NEGATIVE 11/19/2018 0026      Component Value Date/Time   HCO3 26.3 (H) 03/30/2007 0201   TCO2 28 03/30/2007 0201    No results found for: IRON, TIBC, FERRITIN, IRONPCTSAT     ASSESSMENT/PLAN:    1.  End-stage renal disease.  We began  hemodialysis for uremic symptoms.  First dialysis was on Saturday 2/9.  Currently on MWF schedule which I believe is his outpatient schedule - HD tomorrow.   Social work to coordinate discharge with the dialysis unit and his destination.  Discharge is now pending insurance approval  2.  Uremia.  As above, now on dialysis dialysis via right upper extremity AV fistula.  3.  Type 2 diabetes.  Overall blood sugar control is reasonable.  Continue current therapy.  4.  Heart failure with preserved ejection fraction.  This should be easily managed on dialysis.  Ejection fraction from 12/29 was 65 to 70%.  5.  Complete heart block status post pacemaker.  6.  BMD.    Phosphorus control excellent.  7.  Anemia.  Hemoglobin is at goal.  We will continue to monitor for need for ESA.  8.  HTN. Controlled generally.    Jannifer Hick MD

## 2018-11-24 NOTE — Clinical Social Work Note (Signed)
Patient medically stable for discharge and is returning to [facility] - room 1201-P today. Facility admissions director Melvenia Beam notified of patient's readiness for discharge and d/c summary transmitted to SNF. Daughter, Asiel Chrostowski at the bedside and was informed of patient's readiness for discharge. Nurse provided with information to call report to receiving nurse at [facility]. Also provided daughter with SCAT application, which included completed Professional Verification section completed by CSW. CSW signing off as no other SW intervention services needed.  Odie Edmonds Givens, MSW, LCSW Licensed Clinical Social Worker [place] [phone]

## 2018-11-24 NOTE — Progress Notes (Signed)
Report called to [facility]. 

## 2018-11-24 NOTE — Progress Notes (Signed)
Family Medicine Teaching Service Daily Progress Note Intern Pager: [phone]  Patient name: William [name] Medical record number: [id number] Date of birth: 12-Mar-1941 Age: 78 y.o. Gender: male  Primary Care Provider: [name], DO Consultants: Neprhology, SW, Palliative Code Status: DNR   Pt Overview and Major Events to Date:  Hospital Day: 7  Admitted: 11/18/2018 for CC: Nausea and Fatigue 11/19/18 : First HD  Assessment and Plan: William [name] is a 78 y.o. male admitted uremia in setting of new ESRD.  His chronic conditions include CAD, HFpEF, heart block, DM II, HTN.   # New ESRD + BMD  HD yesterday.  Plans per nephrology   Plans to go to [facility] MWF at 1:10 pm   PT/OT  #Chronic Anemia, unspcecified  Stable 10.5>10.4. Baseline over the last year ~11-12.   Continue to monitor outpatient   # Diet controlled Dibabetes  stable  Continue Crestor 10mg    # HTN + HFpEF  Continue home clonidine 0.1mg  BID, hydralazine 75mg  q8 hours, metoprolol 150mg  BID   # CAD s/p PCI  ASA 81mg    # V tach, complete heart block  s/p pace maker   Continue home amiodorone 200mg  and metoprolol   # FEN . Fluid restriction 1225mL daily   . Electrolytes: manage w/ HD  . Renal/carb modified diet   # VTE Prophylaxis . Heparin   # Disposition . Trending Labs: none, CBC, BPM stable 2/12 . Patient admitted from [facility] SNF  PT/OT to see for recs for SNF . Discharge to [facility], pending insurance approval. Patient medically ready for discharge.   #Family Communication  Spoke to daughter on the phone this morning and gave updates from medical viewpoint, he is currently stable. She is concerned that he hasn't taken a shower or had regular hygiene care. She will speak to nursing on staff today.  Subjective  NAEO  Objective   Vital Signs Intake/Output  Temp:  [98 F (36.7 C)-99.1 F (37.3 C)] 98.6 F (37 C) (02/13 0610) Pulse Rate:  [60-65] 64 (02/13  0610) Resp:  [16-18] 16 (02/12 2021) BP: (86-142)/(52-102) 129/54 (02/13 0610) SpO2:  [97 %-100 %] 100 % (02/13 0610) Weight:  [72.2 kg] 72.2 kg (02/12 2303)  Filed Weights   11/23/18 0500 11/23/18 0714 11/23/18 2303  Weight: 72.6 kg 73.1 kg 72.2 kg   Intake/Output      02/12 0701 - 02/13 0700 02/13 0701 - 02/14 0700   P.O. 600    Total Intake(mL/kg) 600 (8.3)    Urine (mL/kg/hr) 400 (0.2)    Emesis/NG output 0    Other 805    Stool 0    Total Output 1205    Net -605         Urine Occurrence 1 x    Stool Occurrence 0 x       Physical Exam  Gen: Lying in bed comfortably with lights off in room  CV: 2/6 holosystolic murmur. 2+ left RP, 1+ right RP. No BLEE Resp:. No increased WOB. CTAB Abd: NTND on palpation to all 4 quadrants.   Extremities: moves extremities spontaneously. Warm and well perfused.   Laboratory: No new labs today  MRSA neg   Imaging/Diagnostic Tests: No results found.   Wilber Oliphant, MD 11/24/2018, 9:01 AM PGY-1, [place] Intern pager: [phone], text pages welcome

## 2018-11-24 NOTE — Progress Notes (Signed)
PT Cancellation Note  Patient Details Name: William [name] MRN: [id number] DOB: 1941-01-28   Cancelled Treatment:    Reason Eval/Treat Not Completed: Fatigue/lethargy limiting ability to participate.  Pt is having some difficulties with fatigue post procedure today, and asked to wait.  Will try again at another time.   Ramond Dial 11/24/2018, 1:38 PM  Mee Hives, PT MS Acute Rehab Dept. Number: [place] [facility] [place]

## 2018-11-25 DIAGNOSIS — N2581 Secondary hyperparathyroidism of renal origin: Secondary | ICD-10-CM | POA: Diagnosis not present

## 2018-11-25 DIAGNOSIS — N185 Chronic kidney disease, stage 5: Secondary | ICD-10-CM | POA: Diagnosis not present

## 2018-11-25 DIAGNOSIS — Z992 Dependence on renal dialysis: Secondary | ICD-10-CM | POA: Diagnosis not present

## 2018-11-25 DIAGNOSIS — D509 Iron deficiency anemia, unspecified: Secondary | ICD-10-CM | POA: Diagnosis not present

## 2018-11-25 DIAGNOSIS — R4189 Other symptoms and signs involving cognitive functions and awareness: Secondary | ICD-10-CM | POA: Diagnosis not present

## 2018-11-25 DIAGNOSIS — N186 End stage renal disease: Secondary | ICD-10-CM | POA: Diagnosis not present

## 2018-11-25 DIAGNOSIS — Z23 Encounter for immunization: Secondary | ICD-10-CM | POA: Diagnosis not present

## 2018-11-25 DIAGNOSIS — E119 Type 2 diabetes mellitus without complications: Secondary | ICD-10-CM | POA: Diagnosis not present

## 2018-11-25 DIAGNOSIS — I5033 Acute on chronic diastolic (congestive) heart failure: Secondary | ICD-10-CM | POA: Diagnosis not present

## 2018-11-25 DIAGNOSIS — D631 Anemia in chronic kidney disease: Secondary | ICD-10-CM | POA: Diagnosis not present

## 2018-11-25 DIAGNOSIS — D688 Other specified coagulation defects: Secondary | ICD-10-CM | POA: Diagnosis not present

## 2018-11-29 DIAGNOSIS — I5031 Acute diastolic (congestive) heart failure: Secondary | ICD-10-CM | POA: Diagnosis not present

## 2018-11-29 DIAGNOSIS — N186 End stage renal disease: Secondary | ICD-10-CM | POA: Diagnosis not present

## 2018-11-29 DIAGNOSIS — R110 Nausea: Secondary | ICD-10-CM | POA: Diagnosis not present

## 2018-11-29 DIAGNOSIS — I10 Essential (primary) hypertension: Secondary | ICD-10-CM | POA: Diagnosis not present

## 2018-11-30 DIAGNOSIS — D509 Iron deficiency anemia, unspecified: Secondary | ICD-10-CM | POA: Insufficient documentation

## 2018-12-02 DIAGNOSIS — I10 Essential (primary) hypertension: Secondary | ICD-10-CM | POA: Diagnosis not present

## 2018-12-02 DIAGNOSIS — E119 Type 2 diabetes mellitus without complications: Secondary | ICD-10-CM | POA: Diagnosis not present

## 2018-12-02 DIAGNOSIS — I5031 Acute diastolic (congestive) heart failure: Secondary | ICD-10-CM | POA: Diagnosis not present

## 2018-12-02 DIAGNOSIS — E7849 Other hyperlipidemia: Secondary | ICD-10-CM | POA: Diagnosis not present

## 2018-12-02 DIAGNOSIS — I472 Ventricular tachycardia: Secondary | ICD-10-CM | POA: Diagnosis not present

## 2018-12-02 DIAGNOSIS — N185 Chronic kidney disease, stage 5: Secondary | ICD-10-CM | POA: Diagnosis not present

## 2018-12-10 DIAGNOSIS — N186 End stage renal disease: Secondary | ICD-10-CM | POA: Diagnosis not present

## 2018-12-10 DIAGNOSIS — I129 Hypertensive chronic kidney disease with stage 1 through stage 4 chronic kidney disease, or unspecified chronic kidney disease: Secondary | ICD-10-CM | POA: Diagnosis not present

## 2018-12-10 DIAGNOSIS — Z992 Dependence on renal dialysis: Secondary | ICD-10-CM | POA: Diagnosis not present

## 2018-12-13 DIAGNOSIS — Z4802 Encounter for removal of sutures: Secondary | ICD-10-CM | POA: Insufficient documentation

## 2018-12-13 DIAGNOSIS — I871 Compression of vein: Secondary | ICD-10-CM | POA: Diagnosis not present

## 2018-12-13 DIAGNOSIS — D688 Other specified coagulation defects: Secondary | ICD-10-CM | POA: Diagnosis not present

## 2018-12-13 DIAGNOSIS — D509 Iron deficiency anemia, unspecified: Secondary | ICD-10-CM | POA: Diagnosis not present

## 2018-12-13 DIAGNOSIS — N186 End stage renal disease: Secondary | ICD-10-CM | POA: Diagnosis not present

## 2018-12-13 DIAGNOSIS — E119 Type 2 diabetes mellitus without complications: Secondary | ICD-10-CM | POA: Diagnosis not present

## 2018-12-13 DIAGNOSIS — Z23 Encounter for immunization: Secondary | ICD-10-CM | POA: Diagnosis not present

## 2018-12-13 DIAGNOSIS — N2581 Secondary hyperparathyroidism of renal origin: Secondary | ICD-10-CM | POA: Diagnosis not present

## 2018-12-13 DIAGNOSIS — D631 Anemia in chronic kidney disease: Secondary | ICD-10-CM | POA: Diagnosis not present

## 2018-12-13 DIAGNOSIS — Z992 Dependence on renal dialysis: Secondary | ICD-10-CM | POA: Diagnosis not present

## 2018-12-13 DIAGNOSIS — T82868A Thrombosis of vascular prosthetic devices, implants and grafts, initial encounter: Secondary | ICD-10-CM | POA: Diagnosis not present

## 2018-12-14 DIAGNOSIS — N186 End stage renal disease: Secondary | ICD-10-CM | POA: Diagnosis not present

## 2018-12-14 DIAGNOSIS — D631 Anemia in chronic kidney disease: Secondary | ICD-10-CM | POA: Diagnosis not present

## 2018-12-14 DIAGNOSIS — Z23 Encounter for immunization: Secondary | ICD-10-CM | POA: Diagnosis not present

## 2018-12-14 DIAGNOSIS — E119 Type 2 diabetes mellitus without complications: Secondary | ICD-10-CM | POA: Diagnosis not present

## 2018-12-14 DIAGNOSIS — N2581 Secondary hyperparathyroidism of renal origin: Secondary | ICD-10-CM | POA: Diagnosis not present

## 2018-12-14 DIAGNOSIS — D688 Other specified coagulation defects: Secondary | ICD-10-CM | POA: Diagnosis not present

## 2018-12-14 DIAGNOSIS — D509 Iron deficiency anemia, unspecified: Secondary | ICD-10-CM | POA: Diagnosis not present

## 2018-12-16 DIAGNOSIS — N2581 Secondary hyperparathyroidism of renal origin: Secondary | ICD-10-CM | POA: Diagnosis not present

## 2018-12-16 DIAGNOSIS — D631 Anemia in chronic kidney disease: Secondary | ICD-10-CM | POA: Diagnosis not present

## 2018-12-16 DIAGNOSIS — N186 End stage renal disease: Secondary | ICD-10-CM | POA: Diagnosis not present

## 2018-12-16 DIAGNOSIS — D509 Iron deficiency anemia, unspecified: Secondary | ICD-10-CM | POA: Diagnosis not present

## 2018-12-16 DIAGNOSIS — Z23 Encounter for immunization: Secondary | ICD-10-CM | POA: Diagnosis not present

## 2018-12-16 DIAGNOSIS — D688 Other specified coagulation defects: Secondary | ICD-10-CM | POA: Diagnosis not present

## 2018-12-16 DIAGNOSIS — E119 Type 2 diabetes mellitus without complications: Secondary | ICD-10-CM | POA: Diagnosis not present

## 2018-12-19 DIAGNOSIS — Z23 Encounter for immunization: Secondary | ICD-10-CM | POA: Diagnosis not present

## 2018-12-19 DIAGNOSIS — D631 Anemia in chronic kidney disease: Secondary | ICD-10-CM | POA: Diagnosis not present

## 2018-12-19 DIAGNOSIS — E119 Type 2 diabetes mellitus without complications: Secondary | ICD-10-CM | POA: Diagnosis not present

## 2018-12-19 DIAGNOSIS — D688 Other specified coagulation defects: Secondary | ICD-10-CM | POA: Diagnosis not present

## 2018-12-19 DIAGNOSIS — D509 Iron deficiency anemia, unspecified: Secondary | ICD-10-CM | POA: Diagnosis not present

## 2018-12-19 DIAGNOSIS — N186 End stage renal disease: Secondary | ICD-10-CM | POA: Diagnosis not present

## 2018-12-19 DIAGNOSIS — N2581 Secondary hyperparathyroidism of renal origin: Secondary | ICD-10-CM | POA: Diagnosis not present

## 2018-12-20 ENCOUNTER — Telehealth: Payer: Self-pay

## 2018-12-20 DIAGNOSIS — N183 Chronic kidney disease, stage 3 (moderate): Secondary | ICD-10-CM | POA: Diagnosis not present

## 2018-12-20 DIAGNOSIS — I517 Cardiomegaly: Secondary | ICD-10-CM | POA: Diagnosis not present

## 2018-12-20 DIAGNOSIS — E1122 Type 2 diabetes mellitus with diabetic chronic kidney disease: Secondary | ICD-10-CM | POA: Diagnosis not present

## 2018-12-20 DIAGNOSIS — I472 Ventricular tachycardia: Secondary | ICD-10-CM | POA: Diagnosis not present

## 2018-12-20 DIAGNOSIS — Z9181 History of falling: Secondary | ICD-10-CM | POA: Diagnosis not present

## 2018-12-20 DIAGNOSIS — E785 Hyperlipidemia, unspecified: Secondary | ICD-10-CM | POA: Diagnosis not present

## 2018-12-20 DIAGNOSIS — I2510 Atherosclerotic heart disease of native coronary artery without angina pectoris: Secondary | ICD-10-CM | POA: Diagnosis not present

## 2018-12-20 DIAGNOSIS — N049 Nephrotic syndrome with unspecified morphologic changes: Secondary | ICD-10-CM | POA: Diagnosis not present

## 2018-12-20 DIAGNOSIS — I129 Hypertensive chronic kidney disease with stage 1 through stage 4 chronic kidney disease, or unspecified chronic kidney disease: Secondary | ICD-10-CM | POA: Diagnosis not present

## 2018-12-20 DIAGNOSIS — Z950 Presence of cardiac pacemaker: Secondary | ICD-10-CM | POA: Diagnosis not present

## 2018-12-20 NOTE — Telephone Encounter (Signed)
Amy, PT with [facility], LVM on nurse line requesting verbal orders for PT: 2x week 3 weeks   VO can be called to [phone], VM can be left on Amy's confidential VM.

## 2018-12-21 DIAGNOSIS — D631 Anemia in chronic kidney disease: Secondary | ICD-10-CM | POA: Diagnosis not present

## 2018-12-21 DIAGNOSIS — E119 Type 2 diabetes mellitus without complications: Secondary | ICD-10-CM | POA: Diagnosis not present

## 2018-12-21 DIAGNOSIS — N2581 Secondary hyperparathyroidism of renal origin: Secondary | ICD-10-CM | POA: Diagnosis not present

## 2018-12-21 DIAGNOSIS — D688 Other specified coagulation defects: Secondary | ICD-10-CM | POA: Diagnosis not present

## 2018-12-21 DIAGNOSIS — D509 Iron deficiency anemia, unspecified: Secondary | ICD-10-CM | POA: Diagnosis not present

## 2018-12-21 DIAGNOSIS — Z23 Encounter for immunization: Secondary | ICD-10-CM | POA: Diagnosis not present

## 2018-12-21 DIAGNOSIS — N186 End stage renal disease: Secondary | ICD-10-CM | POA: Diagnosis not present

## 2018-12-21 DIAGNOSIS — R69 Illness, unspecified: Secondary | ICD-10-CM | POA: Diagnosis not present

## 2018-12-23 DIAGNOSIS — D631 Anemia in chronic kidney disease: Secondary | ICD-10-CM | POA: Diagnosis not present

## 2018-12-23 DIAGNOSIS — D688 Other specified coagulation defects: Secondary | ICD-10-CM | POA: Diagnosis not present

## 2018-12-23 DIAGNOSIS — D509 Iron deficiency anemia, unspecified: Secondary | ICD-10-CM | POA: Diagnosis not present

## 2018-12-23 DIAGNOSIS — N2581 Secondary hyperparathyroidism of renal origin: Secondary | ICD-10-CM | POA: Diagnosis not present

## 2018-12-23 DIAGNOSIS — E119 Type 2 diabetes mellitus without complications: Secondary | ICD-10-CM | POA: Diagnosis not present

## 2018-12-23 DIAGNOSIS — Z23 Encounter for immunization: Secondary | ICD-10-CM | POA: Diagnosis not present

## 2018-12-23 DIAGNOSIS — N186 End stage renal disease: Secondary | ICD-10-CM | POA: Diagnosis not present

## 2018-12-26 DIAGNOSIS — E119 Type 2 diabetes mellitus without complications: Secondary | ICD-10-CM | POA: Diagnosis not present

## 2018-12-26 DIAGNOSIS — N2581 Secondary hyperparathyroidism of renal origin: Secondary | ICD-10-CM | POA: Diagnosis not present

## 2018-12-26 DIAGNOSIS — D509 Iron deficiency anemia, unspecified: Secondary | ICD-10-CM | POA: Diagnosis not present

## 2018-12-26 DIAGNOSIS — D631 Anemia in chronic kidney disease: Secondary | ICD-10-CM | POA: Diagnosis not present

## 2018-12-26 DIAGNOSIS — D688 Other specified coagulation defects: Secondary | ICD-10-CM | POA: Diagnosis not present

## 2018-12-26 DIAGNOSIS — Z23 Encounter for immunization: Secondary | ICD-10-CM | POA: Diagnosis not present

## 2018-12-26 DIAGNOSIS — N186 End stage renal disease: Secondary | ICD-10-CM | POA: Diagnosis not present

## 2018-12-27 DIAGNOSIS — I129 Hypertensive chronic kidney disease with stage 1 through stage 4 chronic kidney disease, or unspecified chronic kidney disease: Secondary | ICD-10-CM | POA: Diagnosis not present

## 2018-12-27 DIAGNOSIS — N049 Nephrotic syndrome with unspecified morphologic changes: Secondary | ICD-10-CM | POA: Diagnosis not present

## 2018-12-27 DIAGNOSIS — E1122 Type 2 diabetes mellitus with diabetic chronic kidney disease: Secondary | ICD-10-CM | POA: Diagnosis not present

## 2018-12-27 DIAGNOSIS — Z950 Presence of cardiac pacemaker: Secondary | ICD-10-CM | POA: Diagnosis not present

## 2018-12-27 DIAGNOSIS — I472 Ventricular tachycardia: Secondary | ICD-10-CM | POA: Diagnosis not present

## 2018-12-27 DIAGNOSIS — E785 Hyperlipidemia, unspecified: Secondary | ICD-10-CM | POA: Diagnosis not present

## 2018-12-27 DIAGNOSIS — I517 Cardiomegaly: Secondary | ICD-10-CM | POA: Diagnosis not present

## 2018-12-27 DIAGNOSIS — N183 Chronic kidney disease, stage 3 (moderate): Secondary | ICD-10-CM | POA: Diagnosis not present

## 2018-12-27 DIAGNOSIS — Z9181 History of falling: Secondary | ICD-10-CM | POA: Diagnosis not present

## 2018-12-27 DIAGNOSIS — I2510 Atherosclerotic heart disease of native coronary artery without angina pectoris: Secondary | ICD-10-CM | POA: Diagnosis not present

## 2018-12-28 DIAGNOSIS — N2581 Secondary hyperparathyroidism of renal origin: Secondary | ICD-10-CM | POA: Diagnosis not present

## 2018-12-28 DIAGNOSIS — N186 End stage renal disease: Secondary | ICD-10-CM | POA: Diagnosis not present

## 2018-12-28 DIAGNOSIS — E119 Type 2 diabetes mellitus without complications: Secondary | ICD-10-CM | POA: Diagnosis not present

## 2018-12-28 DIAGNOSIS — D688 Other specified coagulation defects: Secondary | ICD-10-CM | POA: Diagnosis not present

## 2018-12-28 DIAGNOSIS — Z23 Encounter for immunization: Secondary | ICD-10-CM | POA: Diagnosis not present

## 2018-12-28 DIAGNOSIS — D509 Iron deficiency anemia, unspecified: Secondary | ICD-10-CM | POA: Diagnosis not present

## 2018-12-28 DIAGNOSIS — D631 Anemia in chronic kidney disease: Secondary | ICD-10-CM | POA: Diagnosis not present

## 2018-12-30 DIAGNOSIS — Z23 Encounter for immunization: Secondary | ICD-10-CM | POA: Diagnosis not present

## 2018-12-30 DIAGNOSIS — D509 Iron deficiency anemia, unspecified: Secondary | ICD-10-CM | POA: Diagnosis not present

## 2018-12-30 DIAGNOSIS — D688 Other specified coagulation defects: Secondary | ICD-10-CM | POA: Diagnosis not present

## 2018-12-30 DIAGNOSIS — N2581 Secondary hyperparathyroidism of renal origin: Secondary | ICD-10-CM | POA: Diagnosis not present

## 2018-12-30 DIAGNOSIS — N186 End stage renal disease: Secondary | ICD-10-CM | POA: Diagnosis not present

## 2018-12-30 DIAGNOSIS — D631 Anemia in chronic kidney disease: Secondary | ICD-10-CM | POA: Diagnosis not present

## 2018-12-30 DIAGNOSIS — E119 Type 2 diabetes mellitus without complications: Secondary | ICD-10-CM | POA: Diagnosis not present

## 2019-01-02 DIAGNOSIS — D509 Iron deficiency anemia, unspecified: Secondary | ICD-10-CM | POA: Diagnosis not present

## 2019-01-02 DIAGNOSIS — D688 Other specified coagulation defects: Secondary | ICD-10-CM | POA: Diagnosis not present

## 2019-01-02 DIAGNOSIS — E119 Type 2 diabetes mellitus without complications: Secondary | ICD-10-CM | POA: Diagnosis not present

## 2019-01-02 DIAGNOSIS — N186 End stage renal disease: Secondary | ICD-10-CM | POA: Diagnosis not present

## 2019-01-02 DIAGNOSIS — N2581 Secondary hyperparathyroidism of renal origin: Secondary | ICD-10-CM | POA: Diagnosis not present

## 2019-01-02 DIAGNOSIS — D631 Anemia in chronic kidney disease: Secondary | ICD-10-CM | POA: Diagnosis not present

## 2019-01-02 DIAGNOSIS — Z23 Encounter for immunization: Secondary | ICD-10-CM | POA: Diagnosis not present

## 2019-01-03 DIAGNOSIS — I472 Ventricular tachycardia: Secondary | ICD-10-CM | POA: Diagnosis not present

## 2019-01-03 DIAGNOSIS — I132 Hypertensive heart and chronic kidney disease with heart failure and with stage 5 chronic kidney disease, or end stage renal disease: Secondary | ICD-10-CM | POA: Diagnosis not present

## 2019-01-03 DIAGNOSIS — E1122 Type 2 diabetes mellitus with diabetic chronic kidney disease: Secondary | ICD-10-CM | POA: Diagnosis not present

## 2019-01-03 DIAGNOSIS — Z950 Presence of cardiac pacemaker: Secondary | ICD-10-CM | POA: Diagnosis not present

## 2019-01-03 DIAGNOSIS — E785 Hyperlipidemia, unspecified: Secondary | ICD-10-CM | POA: Diagnosis not present

## 2019-01-03 DIAGNOSIS — I2510 Atherosclerotic heart disease of native coronary artery without angina pectoris: Secondary | ICD-10-CM | POA: Diagnosis not present

## 2019-01-03 DIAGNOSIS — N049 Nephrotic syndrome with unspecified morphologic changes: Secondary | ICD-10-CM | POA: Diagnosis not present

## 2019-01-03 DIAGNOSIS — I5033 Acute on chronic diastolic (congestive) heart failure: Secondary | ICD-10-CM | POA: Diagnosis not present

## 2019-01-03 DIAGNOSIS — Z9181 History of falling: Secondary | ICD-10-CM | POA: Diagnosis not present

## 2019-01-03 DIAGNOSIS — N185 Chronic kidney disease, stage 5: Secondary | ICD-10-CM | POA: Diagnosis not present

## 2019-01-04 DIAGNOSIS — D688 Other specified coagulation defects: Secondary | ICD-10-CM | POA: Diagnosis not present

## 2019-01-04 DIAGNOSIS — N186 End stage renal disease: Secondary | ICD-10-CM | POA: Diagnosis not present

## 2019-01-04 DIAGNOSIS — Z23 Encounter for immunization: Secondary | ICD-10-CM | POA: Diagnosis not present

## 2019-01-04 DIAGNOSIS — N2581 Secondary hyperparathyroidism of renal origin: Secondary | ICD-10-CM | POA: Diagnosis not present

## 2019-01-04 DIAGNOSIS — D509 Iron deficiency anemia, unspecified: Secondary | ICD-10-CM | POA: Diagnosis not present

## 2019-01-04 DIAGNOSIS — D631 Anemia in chronic kidney disease: Secondary | ICD-10-CM | POA: Diagnosis not present

## 2019-01-04 DIAGNOSIS — E119 Type 2 diabetes mellitus without complications: Secondary | ICD-10-CM | POA: Diagnosis not present

## 2019-01-05 ENCOUNTER — Telehealth: Payer: Self-pay | Admitting: *Deleted

## 2019-01-05 NOTE — Telephone Encounter (Signed)
[facility] nurse wants to let PCP know that pt fell yesterday, but did not sustain an injury.  He is fine today. Christen Bame, CMA

## 2019-01-06 DIAGNOSIS — N2581 Secondary hyperparathyroidism of renal origin: Secondary | ICD-10-CM | POA: Diagnosis not present

## 2019-01-06 DIAGNOSIS — D688 Other specified coagulation defects: Secondary | ICD-10-CM | POA: Diagnosis not present

## 2019-01-06 DIAGNOSIS — N186 End stage renal disease: Secondary | ICD-10-CM | POA: Diagnosis not present

## 2019-01-06 DIAGNOSIS — D509 Iron deficiency anemia, unspecified: Secondary | ICD-10-CM | POA: Diagnosis not present

## 2019-01-06 DIAGNOSIS — Z23 Encounter for immunization: Secondary | ICD-10-CM | POA: Diagnosis not present

## 2019-01-06 DIAGNOSIS — D631 Anemia in chronic kidney disease: Secondary | ICD-10-CM | POA: Diagnosis not present

## 2019-01-06 DIAGNOSIS — E119 Type 2 diabetes mellitus without complications: Secondary | ICD-10-CM | POA: Diagnosis not present

## 2019-01-09 DIAGNOSIS — D688 Other specified coagulation defects: Secondary | ICD-10-CM | POA: Diagnosis not present

## 2019-01-09 DIAGNOSIS — E119 Type 2 diabetes mellitus without complications: Secondary | ICD-10-CM | POA: Diagnosis not present

## 2019-01-09 DIAGNOSIS — N186 End stage renal disease: Secondary | ICD-10-CM | POA: Diagnosis not present

## 2019-01-09 DIAGNOSIS — N2581 Secondary hyperparathyroidism of renal origin: Secondary | ICD-10-CM | POA: Diagnosis not present

## 2019-01-09 DIAGNOSIS — D509 Iron deficiency anemia, unspecified: Secondary | ICD-10-CM | POA: Diagnosis not present

## 2019-01-09 DIAGNOSIS — Z23 Encounter for immunization: Secondary | ICD-10-CM | POA: Diagnosis not present

## 2019-01-09 DIAGNOSIS — D631 Anemia in chronic kidney disease: Secondary | ICD-10-CM | POA: Diagnosis not present

## 2019-01-10 DIAGNOSIS — E1122 Type 2 diabetes mellitus with diabetic chronic kidney disease: Secondary | ICD-10-CM | POA: Diagnosis not present

## 2019-01-10 DIAGNOSIS — Z950 Presence of cardiac pacemaker: Secondary | ICD-10-CM | POA: Diagnosis not present

## 2019-01-10 DIAGNOSIS — N049 Nephrotic syndrome with unspecified morphologic changes: Secondary | ICD-10-CM | POA: Diagnosis not present

## 2019-01-10 DIAGNOSIS — I129 Hypertensive chronic kidney disease with stage 1 through stage 4 chronic kidney disease, or unspecified chronic kidney disease: Secondary | ICD-10-CM | POA: Diagnosis not present

## 2019-01-10 DIAGNOSIS — Z992 Dependence on renal dialysis: Secondary | ICD-10-CM | POA: Diagnosis not present

## 2019-01-10 DIAGNOSIS — I2510 Atherosclerotic heart disease of native coronary artery without angina pectoris: Secondary | ICD-10-CM | POA: Diagnosis not present

## 2019-01-10 DIAGNOSIS — I5033 Acute on chronic diastolic (congestive) heart failure: Secondary | ICD-10-CM | POA: Diagnosis not present

## 2019-01-10 DIAGNOSIS — I132 Hypertensive heart and chronic kidney disease with heart failure and with stage 5 chronic kidney disease, or end stage renal disease: Secondary | ICD-10-CM | POA: Diagnosis not present

## 2019-01-10 DIAGNOSIS — E785 Hyperlipidemia, unspecified: Secondary | ICD-10-CM | POA: Diagnosis not present

## 2019-01-10 DIAGNOSIS — N186 End stage renal disease: Secondary | ICD-10-CM | POA: Diagnosis not present

## 2019-01-10 DIAGNOSIS — I472 Ventricular tachycardia: Secondary | ICD-10-CM | POA: Diagnosis not present

## 2019-01-10 DIAGNOSIS — Z9181 History of falling: Secondary | ICD-10-CM | POA: Diagnosis not present

## 2019-01-10 DIAGNOSIS — N185 Chronic kidney disease, stage 5: Secondary | ICD-10-CM | POA: Diagnosis not present

## 2019-01-11 DIAGNOSIS — D688 Other specified coagulation defects: Secondary | ICD-10-CM | POA: Diagnosis not present

## 2019-01-11 DIAGNOSIS — N186 End stage renal disease: Secondary | ICD-10-CM | POA: Diagnosis not present

## 2019-01-11 DIAGNOSIS — E119 Type 2 diabetes mellitus without complications: Secondary | ICD-10-CM | POA: Diagnosis not present

## 2019-01-11 DIAGNOSIS — N2581 Secondary hyperparathyroidism of renal origin: Secondary | ICD-10-CM | POA: Diagnosis not present

## 2019-01-11 DIAGNOSIS — D509 Iron deficiency anemia, unspecified: Secondary | ICD-10-CM | POA: Diagnosis not present

## 2019-01-11 DIAGNOSIS — Z23 Encounter for immunization: Secondary | ICD-10-CM | POA: Diagnosis not present

## 2019-01-11 DIAGNOSIS — D631 Anemia in chronic kidney disease: Secondary | ICD-10-CM | POA: Diagnosis not present

## 2019-01-12 ENCOUNTER — Telehealth: Payer: Self-pay | Admitting: *Deleted

## 2019-01-12 DIAGNOSIS — Z9181 History of falling: Secondary | ICD-10-CM | POA: Diagnosis not present

## 2019-01-12 DIAGNOSIS — N049 Nephrotic syndrome with unspecified morphologic changes: Secondary | ICD-10-CM | POA: Diagnosis not present

## 2019-01-12 DIAGNOSIS — I472 Ventricular tachycardia: Secondary | ICD-10-CM | POA: Diagnosis not present

## 2019-01-12 DIAGNOSIS — I132 Hypertensive heart and chronic kidney disease with heart failure and with stage 5 chronic kidney disease, or end stage renal disease: Secondary | ICD-10-CM | POA: Diagnosis not present

## 2019-01-12 DIAGNOSIS — I2510 Atherosclerotic heart disease of native coronary artery without angina pectoris: Secondary | ICD-10-CM | POA: Diagnosis not present

## 2019-01-12 DIAGNOSIS — I5033 Acute on chronic diastolic (congestive) heart failure: Secondary | ICD-10-CM | POA: Diagnosis not present

## 2019-01-12 DIAGNOSIS — Z950 Presence of cardiac pacemaker: Secondary | ICD-10-CM | POA: Diagnosis not present

## 2019-01-12 DIAGNOSIS — E785 Hyperlipidemia, unspecified: Secondary | ICD-10-CM | POA: Diagnosis not present

## 2019-01-12 DIAGNOSIS — N185 Chronic kidney disease, stage 5: Secondary | ICD-10-CM | POA: Diagnosis not present

## 2019-01-12 DIAGNOSIS — E1122 Type 2 diabetes mellitus with diabetic chronic kidney disease: Secondary | ICD-10-CM | POA: Diagnosis not present

## 2019-01-12 NOTE — Telephone Encounter (Signed)
Brandon from [facility] calling for OT verbal orders as follows:  2 time(s) weekly for 4 week(s)  You can leave verbal orders on confidential voicemail.  Christen Bame, CMA

## 2019-01-17 DIAGNOSIS — N185 Chronic kidney disease, stage 5: Secondary | ICD-10-CM | POA: Diagnosis not present

## 2019-01-17 DIAGNOSIS — I132 Hypertensive heart and chronic kidney disease with heart failure and with stage 5 chronic kidney disease, or end stage renal disease: Secondary | ICD-10-CM | POA: Diagnosis not present

## 2019-01-17 DIAGNOSIS — I472 Ventricular tachycardia: Secondary | ICD-10-CM | POA: Diagnosis not present

## 2019-01-17 DIAGNOSIS — E785 Hyperlipidemia, unspecified: Secondary | ICD-10-CM | POA: Diagnosis not present

## 2019-01-17 DIAGNOSIS — I5033 Acute on chronic diastolic (congestive) heart failure: Secondary | ICD-10-CM | POA: Diagnosis not present

## 2019-01-17 DIAGNOSIS — I2510 Atherosclerotic heart disease of native coronary artery without angina pectoris: Secondary | ICD-10-CM | POA: Diagnosis not present

## 2019-01-17 DIAGNOSIS — N049 Nephrotic syndrome with unspecified morphologic changes: Secondary | ICD-10-CM | POA: Diagnosis not present

## 2019-01-17 DIAGNOSIS — Z950 Presence of cardiac pacemaker: Secondary | ICD-10-CM | POA: Diagnosis not present

## 2019-01-17 DIAGNOSIS — Z9181 History of falling: Secondary | ICD-10-CM | POA: Diagnosis not present

## 2019-01-17 DIAGNOSIS — E1122 Type 2 diabetes mellitus with diabetic chronic kidney disease: Secondary | ICD-10-CM | POA: Diagnosis not present

## 2019-01-19 DIAGNOSIS — Z9181 History of falling: Secondary | ICD-10-CM | POA: Diagnosis not present

## 2019-01-19 DIAGNOSIS — N049 Nephrotic syndrome with unspecified morphologic changes: Secondary | ICD-10-CM | POA: Diagnosis not present

## 2019-01-19 DIAGNOSIS — Z950 Presence of cardiac pacemaker: Secondary | ICD-10-CM | POA: Diagnosis not present

## 2019-01-19 DIAGNOSIS — I5033 Acute on chronic diastolic (congestive) heart failure: Secondary | ICD-10-CM | POA: Diagnosis not present

## 2019-01-19 DIAGNOSIS — I132 Hypertensive heart and chronic kidney disease with heart failure and with stage 5 chronic kidney disease, or end stage renal disease: Secondary | ICD-10-CM | POA: Diagnosis not present

## 2019-01-19 DIAGNOSIS — I472 Ventricular tachycardia: Secondary | ICD-10-CM | POA: Diagnosis not present

## 2019-01-19 DIAGNOSIS — I2510 Atherosclerotic heart disease of native coronary artery without angina pectoris: Secondary | ICD-10-CM | POA: Diagnosis not present

## 2019-01-19 DIAGNOSIS — E1122 Type 2 diabetes mellitus with diabetic chronic kidney disease: Secondary | ICD-10-CM | POA: Diagnosis not present

## 2019-01-19 DIAGNOSIS — N185 Chronic kidney disease, stage 5: Secondary | ICD-10-CM | POA: Diagnosis not present

## 2019-01-19 DIAGNOSIS — E785 Hyperlipidemia, unspecified: Secondary | ICD-10-CM | POA: Diagnosis not present

## 2019-01-19 NOTE — Telephone Encounter (Signed)
Were verbal orders given? Christen Bame, CMA

## 2019-01-24 DIAGNOSIS — E785 Hyperlipidemia, unspecified: Secondary | ICD-10-CM | POA: Diagnosis not present

## 2019-01-24 DIAGNOSIS — Z9181 History of falling: Secondary | ICD-10-CM | POA: Diagnosis not present

## 2019-01-24 DIAGNOSIS — Z950 Presence of cardiac pacemaker: Secondary | ICD-10-CM | POA: Diagnosis not present

## 2019-01-24 DIAGNOSIS — E1122 Type 2 diabetes mellitus with diabetic chronic kidney disease: Secondary | ICD-10-CM | POA: Diagnosis not present

## 2019-01-24 DIAGNOSIS — N049 Nephrotic syndrome with unspecified morphologic changes: Secondary | ICD-10-CM | POA: Diagnosis not present

## 2019-01-24 DIAGNOSIS — N185 Chronic kidney disease, stage 5: Secondary | ICD-10-CM | POA: Diagnosis not present

## 2019-01-24 DIAGNOSIS — I472 Ventricular tachycardia: Secondary | ICD-10-CM | POA: Diagnosis not present

## 2019-01-24 DIAGNOSIS — I2510 Atherosclerotic heart disease of native coronary artery without angina pectoris: Secondary | ICD-10-CM | POA: Diagnosis not present

## 2019-01-24 DIAGNOSIS — I132 Hypertensive heart and chronic kidney disease with heart failure and with stage 5 chronic kidney disease, or end stage renal disease: Secondary | ICD-10-CM | POA: Diagnosis not present

## 2019-01-24 DIAGNOSIS — I5033 Acute on chronic diastolic (congestive) heart failure: Secondary | ICD-10-CM | POA: Diagnosis not present

## 2019-01-26 DIAGNOSIS — I2510 Atherosclerotic heart disease of native coronary artery without angina pectoris: Secondary | ICD-10-CM | POA: Diagnosis not present

## 2019-01-26 DIAGNOSIS — N049 Nephrotic syndrome with unspecified morphologic changes: Secondary | ICD-10-CM | POA: Diagnosis not present

## 2019-01-26 DIAGNOSIS — I132 Hypertensive heart and chronic kidney disease with heart failure and with stage 5 chronic kidney disease, or end stage renal disease: Secondary | ICD-10-CM | POA: Diagnosis not present

## 2019-01-26 DIAGNOSIS — E1122 Type 2 diabetes mellitus with diabetic chronic kidney disease: Secondary | ICD-10-CM | POA: Diagnosis not present

## 2019-01-26 DIAGNOSIS — I472 Ventricular tachycardia: Secondary | ICD-10-CM | POA: Diagnosis not present

## 2019-01-26 DIAGNOSIS — E785 Hyperlipidemia, unspecified: Secondary | ICD-10-CM | POA: Diagnosis not present

## 2019-01-26 DIAGNOSIS — Z950 Presence of cardiac pacemaker: Secondary | ICD-10-CM | POA: Diagnosis not present

## 2019-01-26 DIAGNOSIS — N185 Chronic kidney disease, stage 5: Secondary | ICD-10-CM | POA: Diagnosis not present

## 2019-01-26 DIAGNOSIS — Z9181 History of falling: Secondary | ICD-10-CM | POA: Diagnosis not present

## 2019-01-26 DIAGNOSIS — I5033 Acute on chronic diastolic (congestive) heart failure: Secondary | ICD-10-CM | POA: Diagnosis not present

## 2019-01-29 ENCOUNTER — Other Ambulatory Visit: Payer: Self-pay | Admitting: Family Medicine

## 2019-01-31 DIAGNOSIS — I132 Hypertensive heart and chronic kidney disease with heart failure and with stage 5 chronic kidney disease, or end stage renal disease: Secondary | ICD-10-CM | POA: Diagnosis not present

## 2019-01-31 DIAGNOSIS — I472 Ventricular tachycardia: Secondary | ICD-10-CM | POA: Diagnosis not present

## 2019-01-31 DIAGNOSIS — Z9181 History of falling: Secondary | ICD-10-CM | POA: Diagnosis not present

## 2019-01-31 DIAGNOSIS — E1122 Type 2 diabetes mellitus with diabetic chronic kidney disease: Secondary | ICD-10-CM | POA: Diagnosis not present

## 2019-01-31 DIAGNOSIS — Z950 Presence of cardiac pacemaker: Secondary | ICD-10-CM | POA: Diagnosis not present

## 2019-01-31 DIAGNOSIS — I2510 Atherosclerotic heart disease of native coronary artery without angina pectoris: Secondary | ICD-10-CM | POA: Diagnosis not present

## 2019-01-31 DIAGNOSIS — N049 Nephrotic syndrome with unspecified morphologic changes: Secondary | ICD-10-CM | POA: Diagnosis not present

## 2019-01-31 DIAGNOSIS — N185 Chronic kidney disease, stage 5: Secondary | ICD-10-CM | POA: Diagnosis not present

## 2019-01-31 DIAGNOSIS — I5033 Acute on chronic diastolic (congestive) heart failure: Secondary | ICD-10-CM | POA: Diagnosis not present

## 2019-01-31 DIAGNOSIS — E785 Hyperlipidemia, unspecified: Secondary | ICD-10-CM | POA: Diagnosis not present

## 2019-02-08 DIAGNOSIS — D631 Anemia in chronic kidney disease: Secondary | ICD-10-CM | POA: Diagnosis not present

## 2019-02-08 DIAGNOSIS — N186 End stage renal disease: Secondary | ICD-10-CM | POA: Diagnosis not present

## 2019-02-08 DIAGNOSIS — N2581 Secondary hyperparathyroidism of renal origin: Secondary | ICD-10-CM | POA: Diagnosis not present

## 2019-02-08 DIAGNOSIS — D688 Other specified coagulation defects: Secondary | ICD-10-CM | POA: Diagnosis not present

## 2019-02-08 DIAGNOSIS — D509 Iron deficiency anemia, unspecified: Secondary | ICD-10-CM | POA: Diagnosis not present

## 2019-02-08 DIAGNOSIS — E119 Type 2 diabetes mellitus without complications: Secondary | ICD-10-CM | POA: Diagnosis not present

## 2019-02-08 DIAGNOSIS — Z23 Encounter for immunization: Secondary | ICD-10-CM | POA: Diagnosis not present

## 2019-02-09 DIAGNOSIS — I2510 Atherosclerotic heart disease of native coronary artery without angina pectoris: Secondary | ICD-10-CM | POA: Diagnosis not present

## 2019-02-09 DIAGNOSIS — N049 Nephrotic syndrome with unspecified morphologic changes: Secondary | ICD-10-CM | POA: Diagnosis not present

## 2019-02-09 DIAGNOSIS — I132 Hypertensive heart and chronic kidney disease with heart failure and with stage 5 chronic kidney disease, or end stage renal disease: Secondary | ICD-10-CM | POA: Diagnosis not present

## 2019-02-09 DIAGNOSIS — E1122 Type 2 diabetes mellitus with diabetic chronic kidney disease: Secondary | ICD-10-CM | POA: Diagnosis not present

## 2019-02-09 DIAGNOSIS — Z992 Dependence on renal dialysis: Secondary | ICD-10-CM | POA: Diagnosis not present

## 2019-02-09 DIAGNOSIS — I472 Ventricular tachycardia: Secondary | ICD-10-CM | POA: Diagnosis not present

## 2019-02-09 DIAGNOSIS — I129 Hypertensive chronic kidney disease with stage 1 through stage 4 chronic kidney disease, or unspecified chronic kidney disease: Secondary | ICD-10-CM | POA: Diagnosis not present

## 2019-02-09 DIAGNOSIS — N186 End stage renal disease: Secondary | ICD-10-CM | POA: Diagnosis not present

## 2019-02-09 DIAGNOSIS — Z9181 History of falling: Secondary | ICD-10-CM | POA: Diagnosis not present

## 2019-02-09 DIAGNOSIS — I5033 Acute on chronic diastolic (congestive) heart failure: Secondary | ICD-10-CM | POA: Diagnosis not present

## 2019-02-09 DIAGNOSIS — E785 Hyperlipidemia, unspecified: Secondary | ICD-10-CM | POA: Diagnosis not present

## 2019-02-09 DIAGNOSIS — N185 Chronic kidney disease, stage 5: Secondary | ICD-10-CM | POA: Diagnosis not present

## 2019-02-09 DIAGNOSIS — Z950 Presence of cardiac pacemaker: Secondary | ICD-10-CM | POA: Diagnosis not present

## 2019-02-10 DIAGNOSIS — D688 Other specified coagulation defects: Secondary | ICD-10-CM | POA: Diagnosis not present

## 2019-02-10 DIAGNOSIS — N186 End stage renal disease: Secondary | ICD-10-CM | POA: Diagnosis not present

## 2019-02-10 DIAGNOSIS — E119 Type 2 diabetes mellitus without complications: Secondary | ICD-10-CM | POA: Diagnosis not present

## 2019-02-10 DIAGNOSIS — D631 Anemia in chronic kidney disease: Secondary | ICD-10-CM | POA: Diagnosis not present

## 2019-02-10 DIAGNOSIS — Z23 Encounter for immunization: Secondary | ICD-10-CM | POA: Diagnosis not present

## 2019-02-10 DIAGNOSIS — D509 Iron deficiency anemia, unspecified: Secondary | ICD-10-CM | POA: Diagnosis not present

## 2019-02-10 DIAGNOSIS — N2581 Secondary hyperparathyroidism of renal origin: Secondary | ICD-10-CM | POA: Diagnosis not present

## 2019-02-13 DIAGNOSIS — N2581 Secondary hyperparathyroidism of renal origin: Secondary | ICD-10-CM | POA: Diagnosis not present

## 2019-02-13 DIAGNOSIS — E119 Type 2 diabetes mellitus without complications: Secondary | ICD-10-CM | POA: Diagnosis not present

## 2019-02-13 DIAGNOSIS — Z23 Encounter for immunization: Secondary | ICD-10-CM | POA: Diagnosis not present

## 2019-02-13 DIAGNOSIS — D688 Other specified coagulation defects: Secondary | ICD-10-CM | POA: Diagnosis not present

## 2019-02-13 DIAGNOSIS — D509 Iron deficiency anemia, unspecified: Secondary | ICD-10-CM | POA: Diagnosis not present

## 2019-02-13 DIAGNOSIS — N186 End stage renal disease: Secondary | ICD-10-CM | POA: Diagnosis not present

## 2019-02-13 DIAGNOSIS — D631 Anemia in chronic kidney disease: Secondary | ICD-10-CM | POA: Diagnosis not present

## 2019-02-15 DIAGNOSIS — D631 Anemia in chronic kidney disease: Secondary | ICD-10-CM | POA: Diagnosis not present

## 2019-02-15 DIAGNOSIS — D688 Other specified coagulation defects: Secondary | ICD-10-CM | POA: Diagnosis not present

## 2019-02-15 DIAGNOSIS — N2581 Secondary hyperparathyroidism of renal origin: Secondary | ICD-10-CM | POA: Diagnosis not present

## 2019-02-15 DIAGNOSIS — N186 End stage renal disease: Secondary | ICD-10-CM | POA: Diagnosis not present

## 2019-02-15 DIAGNOSIS — E119 Type 2 diabetes mellitus without complications: Secondary | ICD-10-CM | POA: Diagnosis not present

## 2019-02-15 DIAGNOSIS — Z23 Encounter for immunization: Secondary | ICD-10-CM | POA: Diagnosis not present

## 2019-02-15 DIAGNOSIS — D509 Iron deficiency anemia, unspecified: Secondary | ICD-10-CM | POA: Diagnosis not present

## 2019-02-17 DIAGNOSIS — N2581 Secondary hyperparathyroidism of renal origin: Secondary | ICD-10-CM | POA: Diagnosis not present

## 2019-02-17 DIAGNOSIS — D509 Iron deficiency anemia, unspecified: Secondary | ICD-10-CM | POA: Diagnosis not present

## 2019-02-17 DIAGNOSIS — N186 End stage renal disease: Secondary | ICD-10-CM | POA: Diagnosis not present

## 2019-02-17 DIAGNOSIS — D631 Anemia in chronic kidney disease: Secondary | ICD-10-CM | POA: Diagnosis not present

## 2019-02-17 DIAGNOSIS — Z23 Encounter for immunization: Secondary | ICD-10-CM | POA: Diagnosis not present

## 2019-02-17 DIAGNOSIS — E119 Type 2 diabetes mellitus without complications: Secondary | ICD-10-CM | POA: Diagnosis not present

## 2019-02-17 DIAGNOSIS — D688 Other specified coagulation defects: Secondary | ICD-10-CM | POA: Diagnosis not present

## 2019-02-20 ENCOUNTER — Telehealth: Payer: Self-pay | Admitting: Nurse Practitioner

## 2019-02-20 DIAGNOSIS — N186 End stage renal disease: Secondary | ICD-10-CM | POA: Diagnosis not present

## 2019-02-20 DIAGNOSIS — D509 Iron deficiency anemia, unspecified: Secondary | ICD-10-CM | POA: Diagnosis not present

## 2019-02-20 DIAGNOSIS — D631 Anemia in chronic kidney disease: Secondary | ICD-10-CM | POA: Diagnosis not present

## 2019-02-20 DIAGNOSIS — Z23 Encounter for immunization: Secondary | ICD-10-CM | POA: Diagnosis not present

## 2019-02-20 DIAGNOSIS — E119 Type 2 diabetes mellitus without complications: Secondary | ICD-10-CM | POA: Diagnosis not present

## 2019-02-20 DIAGNOSIS — N2581 Secondary hyperparathyroidism of renal origin: Secondary | ICD-10-CM | POA: Diagnosis not present

## 2019-02-20 DIAGNOSIS — D688 Other specified coagulation defects: Secondary | ICD-10-CM | POA: Diagnosis not present

## 2019-02-20 NOTE — Telephone Encounter (Signed)
Left message for patient to call back regarding his appointment with [name] on 5/18

## 2019-02-22 DIAGNOSIS — D688 Other specified coagulation defects: Secondary | ICD-10-CM | POA: Diagnosis not present

## 2019-02-22 DIAGNOSIS — D509 Iron deficiency anemia, unspecified: Secondary | ICD-10-CM | POA: Diagnosis not present

## 2019-02-22 DIAGNOSIS — D631 Anemia in chronic kidney disease: Secondary | ICD-10-CM | POA: Diagnosis not present

## 2019-02-22 DIAGNOSIS — N186 End stage renal disease: Secondary | ICD-10-CM | POA: Diagnosis not present

## 2019-02-22 DIAGNOSIS — N2581 Secondary hyperparathyroidism of renal origin: Secondary | ICD-10-CM | POA: Diagnosis not present

## 2019-02-22 DIAGNOSIS — E119 Type 2 diabetes mellitus without complications: Secondary | ICD-10-CM | POA: Diagnosis not present

## 2019-02-22 DIAGNOSIS — Z23 Encounter for immunization: Secondary | ICD-10-CM | POA: Diagnosis not present

## 2019-02-23 NOTE — Telephone Encounter (Signed)
Spoke with patient to reschedule his appointment with [name]. During our discussion I realized patient has not seen EP as advised by [name], NP in January. He states the appointment was cancelled. He states he is passing out at the end of dialysis. He states he does not feel like he is going to pass out any other time, only as his dialysis is ending. I advised him per [name], to hold his hydralazine and clonidine on the morning of dialysis.  I moved his appointment to a clinic appointment on 5/21 with [name] and will send a message to the EP scheduler for patient to be rescheduled with an EP provider. Patient will need an ekg. Patient verbalized understanding and agreement with plan and thanked me for the call.

## 2019-02-24 DIAGNOSIS — D509 Iron deficiency anemia, unspecified: Secondary | ICD-10-CM | POA: Diagnosis not present

## 2019-02-24 DIAGNOSIS — N2581 Secondary hyperparathyroidism of renal origin: Secondary | ICD-10-CM | POA: Diagnosis not present

## 2019-02-24 DIAGNOSIS — N186 End stage renal disease: Secondary | ICD-10-CM | POA: Diagnosis not present

## 2019-02-24 DIAGNOSIS — D631 Anemia in chronic kidney disease: Secondary | ICD-10-CM | POA: Diagnosis not present

## 2019-02-24 DIAGNOSIS — D688 Other specified coagulation defects: Secondary | ICD-10-CM | POA: Diagnosis not present

## 2019-02-24 DIAGNOSIS — E119 Type 2 diabetes mellitus without complications: Secondary | ICD-10-CM | POA: Diagnosis not present

## 2019-02-24 DIAGNOSIS — Z23 Encounter for immunization: Secondary | ICD-10-CM | POA: Diagnosis not present

## 2019-02-27 ENCOUNTER — Ambulatory Visit: Payer: Medicare HMO | Admitting: Cardiovascular Disease

## 2019-02-27 DIAGNOSIS — D509 Iron deficiency anemia, unspecified: Secondary | ICD-10-CM | POA: Diagnosis not present

## 2019-02-27 DIAGNOSIS — E119 Type 2 diabetes mellitus without complications: Secondary | ICD-10-CM | POA: Diagnosis not present

## 2019-02-27 DIAGNOSIS — N186 End stage renal disease: Secondary | ICD-10-CM | POA: Diagnosis not present

## 2019-02-27 DIAGNOSIS — N2581 Secondary hyperparathyroidism of renal origin: Secondary | ICD-10-CM | POA: Diagnosis not present

## 2019-02-27 DIAGNOSIS — D688 Other specified coagulation defects: Secondary | ICD-10-CM | POA: Diagnosis not present

## 2019-02-27 DIAGNOSIS — D631 Anemia in chronic kidney disease: Secondary | ICD-10-CM | POA: Diagnosis not present

## 2019-02-27 DIAGNOSIS — Z23 Encounter for immunization: Secondary | ICD-10-CM | POA: Diagnosis not present

## 2019-02-28 ENCOUNTER — Telehealth: Payer: Self-pay | Admitting: Cardiology

## 2019-02-28 NOTE — Telephone Encounter (Signed)
-----   Message from Debbora Dus sent at 02/28/2019 10:22 AM EDT ----- Good morning, Ladies!!  I have a staff message to reschedule this fellow from a cancellation he had in February to re-establish care d/t being hospitalized. Wanted to see if we have his device info and if we know  If he is remote capable before I make his appt virtual.    Thanks, Hope you are all doing well! Melissa

## 2019-02-28 NOTE — Telephone Encounter (Signed)
LMOVM requesting a return call. Pt needs to be released in carelink.

## 2019-03-01 DIAGNOSIS — D631 Anemia in chronic kidney disease: Secondary | ICD-10-CM | POA: Diagnosis not present

## 2019-03-01 DIAGNOSIS — E119 Type 2 diabetes mellitus without complications: Secondary | ICD-10-CM | POA: Diagnosis not present

## 2019-03-01 DIAGNOSIS — N2581 Secondary hyperparathyroidism of renal origin: Secondary | ICD-10-CM | POA: Diagnosis not present

## 2019-03-01 DIAGNOSIS — D688 Other specified coagulation defects: Secondary | ICD-10-CM | POA: Diagnosis not present

## 2019-03-01 DIAGNOSIS — D509 Iron deficiency anemia, unspecified: Secondary | ICD-10-CM | POA: Diagnosis not present

## 2019-03-01 DIAGNOSIS — N186 End stage renal disease: Secondary | ICD-10-CM | POA: Diagnosis not present

## 2019-03-01 DIAGNOSIS — Z23 Encounter for immunization: Secondary | ICD-10-CM | POA: Diagnosis not present

## 2019-03-01 NOTE — Telephone Encounter (Signed)
Pt is in enrolled in carelink

## 2019-03-02 ENCOUNTER — Encounter: Payer: Self-pay | Admitting: Cardiovascular Disease

## 2019-03-02 ENCOUNTER — Other Ambulatory Visit: Payer: Self-pay

## 2019-03-02 ENCOUNTER — Ambulatory Visit (INDEPENDENT_AMBULATORY_CARE_PROVIDER_SITE_OTHER): Payer: BC Managed Care – PPO | Admitting: Cardiovascular Disease

## 2019-03-02 VITALS — BP 108/62 | HR 60 | Ht 67.0 in | Wt 144.0 lb

## 2019-03-02 DIAGNOSIS — N186 End stage renal disease: Secondary | ICD-10-CM | POA: Diagnosis not present

## 2019-03-02 DIAGNOSIS — Z992 Dependence on renal dialysis: Secondary | ICD-10-CM | POA: Diagnosis not present

## 2019-03-02 DIAGNOSIS — Z950 Presence of cardiac pacemaker: Secondary | ICD-10-CM | POA: Diagnosis not present

## 2019-03-02 DIAGNOSIS — I1311 Hypertensive heart and chronic kidney disease without heart failure, with stage 5 chronic kidney disease, or end stage renal disease: Secondary | ICD-10-CM

## 2019-03-02 DIAGNOSIS — I10 Essential (primary) hypertension: Secondary | ICD-10-CM | POA: Diagnosis not present

## 2019-03-02 DIAGNOSIS — N185 Chronic kidney disease, stage 5: Secondary | ICD-10-CM

## 2019-03-02 DIAGNOSIS — Z7189 Other specified counseling: Secondary | ICD-10-CM

## 2019-03-02 MED ORDER — AMLODIPINE BESYLATE 5 MG PO TABS: 5.0000 mg | ORAL_TABLET | Freq: Every day | ORAL | 3 refills | Status: DC
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
## 2019-03-02 NOTE — Patient Instructions (Signed)
Medication Instructions:  Your physician has recommended you make the following change in your medication:   DECREASE Amlodipine to 5 mg once daily  If you need a refill on your cardiac medications before your next appointment, please call your pharmacy.    Lab work: None Ordered    Testing/Procedures: None Ordered    Follow-Up: Your physician recommends that you schedule a follow-up appointment on Thursday Sept. 10 at 2:20 pm with [name]

## 2019-03-03 DIAGNOSIS — D631 Anemia in chronic kidney disease: Secondary | ICD-10-CM | POA: Diagnosis not present

## 2019-03-03 DIAGNOSIS — N2581 Secondary hyperparathyroidism of renal origin: Secondary | ICD-10-CM | POA: Diagnosis not present

## 2019-03-03 DIAGNOSIS — Z23 Encounter for immunization: Secondary | ICD-10-CM | POA: Diagnosis not present

## 2019-03-03 DIAGNOSIS — D509 Iron deficiency anemia, unspecified: Secondary | ICD-10-CM | POA: Diagnosis not present

## 2019-03-03 DIAGNOSIS — E119 Type 2 diabetes mellitus without complications: Secondary | ICD-10-CM | POA: Diagnosis not present

## 2019-03-03 DIAGNOSIS — D688 Other specified coagulation defects: Secondary | ICD-10-CM | POA: Diagnosis not present

## 2019-03-03 DIAGNOSIS — N186 End stage renal disease: Secondary | ICD-10-CM | POA: Diagnosis not present

## 2019-03-06 DIAGNOSIS — D631 Anemia in chronic kidney disease: Secondary | ICD-10-CM | POA: Diagnosis not present

## 2019-03-06 DIAGNOSIS — E119 Type 2 diabetes mellitus without complications: Secondary | ICD-10-CM | POA: Diagnosis not present

## 2019-03-06 DIAGNOSIS — N186 End stage renal disease: Secondary | ICD-10-CM | POA: Diagnosis not present

## 2019-03-06 DIAGNOSIS — D688 Other specified coagulation defects: Secondary | ICD-10-CM | POA: Diagnosis not present

## 2019-03-06 DIAGNOSIS — D509 Iron deficiency anemia, unspecified: Secondary | ICD-10-CM | POA: Diagnosis not present

## 2019-03-06 DIAGNOSIS — N2581 Secondary hyperparathyroidism of renal origin: Secondary | ICD-10-CM | POA: Diagnosis not present

## 2019-03-06 DIAGNOSIS — Z23 Encounter for immunization: Secondary | ICD-10-CM | POA: Diagnosis not present

## 2019-03-08 DIAGNOSIS — D509 Iron deficiency anemia, unspecified: Secondary | ICD-10-CM | POA: Diagnosis not present

## 2019-03-08 DIAGNOSIS — E119 Type 2 diabetes mellitus without complications: Secondary | ICD-10-CM | POA: Diagnosis not present

## 2019-03-08 DIAGNOSIS — D631 Anemia in chronic kidney disease: Secondary | ICD-10-CM | POA: Diagnosis not present

## 2019-03-08 DIAGNOSIS — N2581 Secondary hyperparathyroidism of renal origin: Secondary | ICD-10-CM | POA: Diagnosis not present

## 2019-03-08 DIAGNOSIS — D688 Other specified coagulation defects: Secondary | ICD-10-CM | POA: Diagnosis not present

## 2019-03-08 DIAGNOSIS — Z23 Encounter for immunization: Secondary | ICD-10-CM | POA: Diagnosis not present

## 2019-03-08 DIAGNOSIS — N186 End stage renal disease: Secondary | ICD-10-CM | POA: Diagnosis not present

## 2019-03-10 DIAGNOSIS — N2581 Secondary hyperparathyroidism of renal origin: Secondary | ICD-10-CM | POA: Diagnosis not present

## 2019-03-10 DIAGNOSIS — D631 Anemia in chronic kidney disease: Secondary | ICD-10-CM | POA: Diagnosis not present

## 2019-03-10 DIAGNOSIS — E119 Type 2 diabetes mellitus without complications: Secondary | ICD-10-CM | POA: Diagnosis not present

## 2019-03-10 DIAGNOSIS — D509 Iron deficiency anemia, unspecified: Secondary | ICD-10-CM | POA: Diagnosis not present

## 2019-03-10 DIAGNOSIS — D688 Other specified coagulation defects: Secondary | ICD-10-CM | POA: Diagnosis not present

## 2019-03-10 DIAGNOSIS — Z23 Encounter for immunization: Secondary | ICD-10-CM | POA: Diagnosis not present

## 2019-03-10 DIAGNOSIS — N186 End stage renal disease: Secondary | ICD-10-CM | POA: Diagnosis not present

## 2019-03-12 DIAGNOSIS — N186 End stage renal disease: Secondary | ICD-10-CM | POA: Diagnosis not present

## 2019-03-12 DIAGNOSIS — Z992 Dependence on renal dialysis: Secondary | ICD-10-CM | POA: Diagnosis not present

## 2019-03-12 DIAGNOSIS — I129 Hypertensive chronic kidney disease with stage 1 through stage 4 chronic kidney disease, or unspecified chronic kidney disease: Secondary | ICD-10-CM | POA: Diagnosis not present

## 2019-03-13 DIAGNOSIS — N186 End stage renal disease: Secondary | ICD-10-CM | POA: Diagnosis not present

## 2019-03-13 DIAGNOSIS — Z992 Dependence on renal dialysis: Secondary | ICD-10-CM | POA: Diagnosis not present

## 2019-03-13 DIAGNOSIS — I871 Compression of vein: Secondary | ICD-10-CM | POA: Diagnosis not present

## 2019-03-13 DIAGNOSIS — T82868A Thrombosis of vascular prosthetic devices, implants and grafts, initial encounter: Secondary | ICD-10-CM | POA: Diagnosis not present

## 2019-03-14 DIAGNOSIS — D631 Anemia in chronic kidney disease: Secondary | ICD-10-CM | POA: Diagnosis not present

## 2019-03-14 DIAGNOSIS — E119 Type 2 diabetes mellitus without complications: Secondary | ICD-10-CM | POA: Diagnosis not present

## 2019-03-14 DIAGNOSIS — D688 Other specified coagulation defects: Secondary | ICD-10-CM | POA: Diagnosis not present

## 2019-03-14 DIAGNOSIS — N2581 Secondary hyperparathyroidism of renal origin: Secondary | ICD-10-CM | POA: Diagnosis not present

## 2019-03-14 DIAGNOSIS — N186 End stage renal disease: Secondary | ICD-10-CM | POA: Diagnosis not present

## 2019-03-14 DIAGNOSIS — D509 Iron deficiency anemia, unspecified: Secondary | ICD-10-CM | POA: Diagnosis not present

## 2019-03-15 DIAGNOSIS — D509 Iron deficiency anemia, unspecified: Secondary | ICD-10-CM | POA: Diagnosis not present

## 2019-03-15 DIAGNOSIS — D688 Other specified coagulation defects: Secondary | ICD-10-CM | POA: Diagnosis not present

## 2019-03-15 DIAGNOSIS — E119 Type 2 diabetes mellitus without complications: Secondary | ICD-10-CM | POA: Diagnosis not present

## 2019-03-15 DIAGNOSIS — N186 End stage renal disease: Secondary | ICD-10-CM | POA: Diagnosis not present

## 2019-03-15 DIAGNOSIS — D631 Anemia in chronic kidney disease: Secondary | ICD-10-CM | POA: Diagnosis not present

## 2019-03-15 DIAGNOSIS — N2581 Secondary hyperparathyroidism of renal origin: Secondary | ICD-10-CM | POA: Diagnosis not present

## 2019-03-17 DIAGNOSIS — N2581 Secondary hyperparathyroidism of renal origin: Secondary | ICD-10-CM | POA: Diagnosis not present

## 2019-03-17 DIAGNOSIS — D509 Iron deficiency anemia, unspecified: Secondary | ICD-10-CM | POA: Diagnosis not present

## 2019-03-17 DIAGNOSIS — D688 Other specified coagulation defects: Secondary | ICD-10-CM | POA: Diagnosis not present

## 2019-03-17 DIAGNOSIS — D631 Anemia in chronic kidney disease: Secondary | ICD-10-CM | POA: Diagnosis not present

## 2019-03-17 DIAGNOSIS — E119 Type 2 diabetes mellitus without complications: Secondary | ICD-10-CM | POA: Diagnosis not present

## 2019-03-17 DIAGNOSIS — N186 End stage renal disease: Secondary | ICD-10-CM | POA: Diagnosis not present

## 2019-03-20 DIAGNOSIS — D509 Iron deficiency anemia, unspecified: Secondary | ICD-10-CM | POA: Diagnosis not present

## 2019-03-20 DIAGNOSIS — D688 Other specified coagulation defects: Secondary | ICD-10-CM | POA: Diagnosis not present

## 2019-03-20 DIAGNOSIS — D631 Anemia in chronic kidney disease: Secondary | ICD-10-CM | POA: Diagnosis not present

## 2019-03-20 DIAGNOSIS — N186 End stage renal disease: Secondary | ICD-10-CM | POA: Diagnosis not present

## 2019-03-20 DIAGNOSIS — E119 Type 2 diabetes mellitus without complications: Secondary | ICD-10-CM | POA: Diagnosis not present

## 2019-03-20 DIAGNOSIS — N2581 Secondary hyperparathyroidism of renal origin: Secondary | ICD-10-CM | POA: Diagnosis not present

## 2019-03-22 DIAGNOSIS — N2581 Secondary hyperparathyroidism of renal origin: Secondary | ICD-10-CM | POA: Diagnosis not present

## 2019-03-22 DIAGNOSIS — N186 End stage renal disease: Secondary | ICD-10-CM | POA: Diagnosis not present

## 2019-03-22 DIAGNOSIS — D631 Anemia in chronic kidney disease: Secondary | ICD-10-CM | POA: Diagnosis not present

## 2019-03-22 DIAGNOSIS — E119 Type 2 diabetes mellitus without complications: Secondary | ICD-10-CM | POA: Diagnosis not present

## 2019-03-22 DIAGNOSIS — D509 Iron deficiency anemia, unspecified: Secondary | ICD-10-CM | POA: Diagnosis not present

## 2019-03-22 DIAGNOSIS — D688 Other specified coagulation defects: Secondary | ICD-10-CM | POA: Diagnosis not present

## 2019-03-24 DIAGNOSIS — D631 Anemia in chronic kidney disease: Secondary | ICD-10-CM | POA: Diagnosis not present

## 2019-03-24 DIAGNOSIS — N2581 Secondary hyperparathyroidism of renal origin: Secondary | ICD-10-CM | POA: Diagnosis not present

## 2019-03-24 DIAGNOSIS — E119 Type 2 diabetes mellitus without complications: Secondary | ICD-10-CM | POA: Diagnosis not present

## 2019-03-24 DIAGNOSIS — D509 Iron deficiency anemia, unspecified: Secondary | ICD-10-CM | POA: Diagnosis not present

## 2019-03-24 DIAGNOSIS — N186 End stage renal disease: Secondary | ICD-10-CM | POA: Diagnosis not present

## 2019-03-24 DIAGNOSIS — D688 Other specified coagulation defects: Secondary | ICD-10-CM | POA: Diagnosis not present

## 2019-03-27 DIAGNOSIS — D631 Anemia in chronic kidney disease: Secondary | ICD-10-CM | POA: Diagnosis not present

## 2019-03-27 DIAGNOSIS — D688 Other specified coagulation defects: Secondary | ICD-10-CM | POA: Diagnosis not present

## 2019-03-27 DIAGNOSIS — N2581 Secondary hyperparathyroidism of renal origin: Secondary | ICD-10-CM | POA: Diagnosis not present

## 2019-03-27 DIAGNOSIS — D509 Iron deficiency anemia, unspecified: Secondary | ICD-10-CM | POA: Diagnosis not present

## 2019-03-27 DIAGNOSIS — E119 Type 2 diabetes mellitus without complications: Secondary | ICD-10-CM | POA: Diagnosis not present

## 2019-03-27 DIAGNOSIS — N186 End stage renal disease: Secondary | ICD-10-CM | POA: Diagnosis not present

## 2019-03-29 DIAGNOSIS — D631 Anemia in chronic kidney disease: Secondary | ICD-10-CM | POA: Diagnosis not present

## 2019-03-29 DIAGNOSIS — N2581 Secondary hyperparathyroidism of renal origin: Secondary | ICD-10-CM | POA: Diagnosis not present

## 2019-03-29 DIAGNOSIS — D688 Other specified coagulation defects: Secondary | ICD-10-CM | POA: Diagnosis not present

## 2019-03-29 DIAGNOSIS — N186 End stage renal disease: Secondary | ICD-10-CM | POA: Diagnosis not present

## 2019-03-29 DIAGNOSIS — E119 Type 2 diabetes mellitus without complications: Secondary | ICD-10-CM | POA: Diagnosis not present

## 2019-03-29 DIAGNOSIS — D509 Iron deficiency anemia, unspecified: Secondary | ICD-10-CM | POA: Diagnosis not present

## 2019-03-30 ENCOUNTER — Ambulatory Visit (INDEPENDENT_AMBULATORY_CARE_PROVIDER_SITE_OTHER): Payer: Medicare HMO | Admitting: *Deleted

## 2019-03-30 DIAGNOSIS — I5033 Acute on chronic diastolic (congestive) heart failure: Secondary | ICD-10-CM

## 2019-03-30 DIAGNOSIS — I472 Ventricular tachycardia, unspecified: Secondary | ICD-10-CM

## 2019-03-30 LAB — CUP PACEART REMOTE DEVICE CHECK
Battery Impedance: 518 Ohm
Battery Remaining Longevity: 83 mo
Battery Voltage: 2.78 V
Brady Statistic AP VP Percent: 59 %
Brady Statistic AP VS Percent: 0 %
Brady Statistic AS VP Percent: 41 %
Brady Statistic AS VS Percent: 0 %
Date Time Interrogation Session: 20200618153021
Implantable Lead Implant Date: 20060418
Implantable Lead Implant Date: 20060418
Implantable Lead Location: 753859
Implantable Lead Location: 753860
Implantable Lead Model: 5076
Implantable Lead Model: 5076
Implantable Pulse Generator Implant Date: 20060418
Lead Channel Impedance Value: 431 Ohm
Lead Channel Impedance Value: 633 Ohm
Lead Channel Pacing Threshold Amplitude: 0.5 V
Lead Channel Pacing Threshold Amplitude: 0.625 V
Lead Channel Pacing Threshold Pulse Width: 0.4 ms
Lead Channel Pacing Threshold Pulse Width: 0.4 ms
Lead Channel Sensing Intrinsic Amplitude: 2.8 mV
Lead Channel Setting Pacing Amplitude: 1.5 V
Lead Channel Setting Pacing Amplitude: 2 V
Lead Channel Setting Pacing Pulse Width: 0.4 ms
Lead Channel Setting Sensing Sensitivity: 4 mV

## 2019-03-31 DIAGNOSIS — N2581 Secondary hyperparathyroidism of renal origin: Secondary | ICD-10-CM | POA: Diagnosis not present

## 2019-03-31 DIAGNOSIS — D688 Other specified coagulation defects: Secondary | ICD-10-CM | POA: Diagnosis not present

## 2019-03-31 DIAGNOSIS — D509 Iron deficiency anemia, unspecified: Secondary | ICD-10-CM | POA: Diagnosis not present

## 2019-03-31 DIAGNOSIS — N186 End stage renal disease: Secondary | ICD-10-CM | POA: Diagnosis not present

## 2019-03-31 DIAGNOSIS — D631 Anemia in chronic kidney disease: Secondary | ICD-10-CM | POA: Diagnosis not present

## 2019-03-31 DIAGNOSIS — E119 Type 2 diabetes mellitus without complications: Secondary | ICD-10-CM | POA: Diagnosis not present

## 2019-04-03 DIAGNOSIS — D631 Anemia in chronic kidney disease: Secondary | ICD-10-CM | POA: Diagnosis not present

## 2019-04-03 DIAGNOSIS — D509 Iron deficiency anemia, unspecified: Secondary | ICD-10-CM | POA: Diagnosis not present

## 2019-04-03 DIAGNOSIS — E119 Type 2 diabetes mellitus without complications: Secondary | ICD-10-CM | POA: Diagnosis not present

## 2019-04-03 DIAGNOSIS — N186 End stage renal disease: Secondary | ICD-10-CM | POA: Diagnosis not present

## 2019-04-03 DIAGNOSIS — D688 Other specified coagulation defects: Secondary | ICD-10-CM | POA: Diagnosis not present

## 2019-04-03 DIAGNOSIS — N2581 Secondary hyperparathyroidism of renal origin: Secondary | ICD-10-CM | POA: Diagnosis not present

## 2019-04-04 NOTE — Progress Notes (Signed)
Remote pacemaker transmission.   

## 2019-04-05 DIAGNOSIS — I871 Compression of vein: Secondary | ICD-10-CM | POA: Diagnosis not present

## 2019-04-05 DIAGNOSIS — Z992 Dependence on renal dialysis: Secondary | ICD-10-CM | POA: Diagnosis not present

## 2019-04-05 DIAGNOSIS — T82868A Thrombosis of vascular prosthetic devices, implants and grafts, initial encounter: Secondary | ICD-10-CM | POA: Diagnosis not present

## 2019-04-05 DIAGNOSIS — N186 End stage renal disease: Secondary | ICD-10-CM | POA: Diagnosis not present

## 2019-04-06 DIAGNOSIS — D509 Iron deficiency anemia, unspecified: Secondary | ICD-10-CM | POA: Diagnosis not present

## 2019-04-06 DIAGNOSIS — N186 End stage renal disease: Secondary | ICD-10-CM | POA: Diagnosis not present

## 2019-04-06 DIAGNOSIS — N2581 Secondary hyperparathyroidism of renal origin: Secondary | ICD-10-CM | POA: Diagnosis not present

## 2019-04-06 DIAGNOSIS — D631 Anemia in chronic kidney disease: Secondary | ICD-10-CM | POA: Diagnosis not present

## 2019-04-06 DIAGNOSIS — E119 Type 2 diabetes mellitus without complications: Secondary | ICD-10-CM | POA: Diagnosis not present

## 2019-04-06 DIAGNOSIS — D688 Other specified coagulation defects: Secondary | ICD-10-CM | POA: Diagnosis not present

## 2019-04-07 ENCOUNTER — Other Ambulatory Visit: Payer: Self-pay

## 2019-04-07 ENCOUNTER — Inpatient Hospital Stay (HOSPITAL_COMMUNITY)
Admission: EM | Admit: 2019-04-07 | Discharge: 2019-04-09 | DRG: 640 | Disposition: A | Discharge status: Home / Self Care | Payer: Medicare HMO | Attending: Family Medicine | Admitting: Family Medicine

## 2019-04-07 ENCOUNTER — Emergency Department (HOSPITAL_COMMUNITY): Payer: Medicare HMO

## 2019-04-07 DIAGNOSIS — E1122 Type 2 diabetes mellitus with diabetic chronic kidney disease: Secondary | ICD-10-CM | POA: Diagnosis present

## 2019-04-07 DIAGNOSIS — Z20828 Contact with and (suspected) exposure to other viral communicable diseases: Secondary | ICD-10-CM | POA: Diagnosis not present

## 2019-04-07 DIAGNOSIS — I152 Hypertension secondary to endocrine disorders: Secondary | ICD-10-CM | POA: Diagnosis present

## 2019-04-07 DIAGNOSIS — I5033 Acute on chronic diastolic (congestive) heart failure: Secondary | ICD-10-CM | POA: Diagnosis present

## 2019-04-07 DIAGNOSIS — R918 Other nonspecific abnormal finding of lung field: Secondary | ICD-10-CM | POA: Diagnosis not present

## 2019-04-07 DIAGNOSIS — N2581 Secondary hyperparathyroidism of renal origin: Secondary | ICD-10-CM | POA: Diagnosis not present

## 2019-04-07 DIAGNOSIS — Z992 Dependence on renal dialysis: Secondary | ICD-10-CM

## 2019-04-07 DIAGNOSIS — R55 Syncope and collapse: Secondary | ICD-10-CM

## 2019-04-07 DIAGNOSIS — E876 Hypokalemia: Secondary | ICD-10-CM | POA: Diagnosis not present

## 2019-04-07 DIAGNOSIS — M898X9 Other specified disorders of bone, unspecified site: Secondary | ICD-10-CM | POA: Diagnosis present

## 2019-04-07 DIAGNOSIS — I132 Hypertensive heart and chronic kidney disease with heart failure and with stage 5 chronic kidney disease, or end stage renal disease: Secondary | ICD-10-CM | POA: Diagnosis present

## 2019-04-07 DIAGNOSIS — N186 End stage renal disease: Secondary | ICD-10-CM | POA: Diagnosis present

## 2019-04-07 DIAGNOSIS — Z7982 Long term (current) use of aspirin: Secondary | ICD-10-CM

## 2019-04-07 DIAGNOSIS — E119 Type 2 diabetes mellitus without complications: Secondary | ICD-10-CM | POA: Diagnosis not present

## 2019-04-07 DIAGNOSIS — E7800 Pure hypercholesterolemia, unspecified: Secondary | ICD-10-CM | POA: Diagnosis present

## 2019-04-07 DIAGNOSIS — I4729 Other ventricular tachycardia: Secondary | ICD-10-CM

## 2019-04-07 DIAGNOSIS — E861 Hypovolemia: Secondary | ICD-10-CM | POA: Diagnosis not present

## 2019-04-07 DIAGNOSIS — E1159 Type 2 diabetes mellitus with other circulatory complications: Secondary | ICD-10-CM | POA: Diagnosis present

## 2019-04-07 DIAGNOSIS — D631 Anemia in chronic kidney disease: Secondary | ICD-10-CM | POA: Diagnosis present

## 2019-04-07 DIAGNOSIS — J939 Pneumothorax, unspecified: Secondary | ICD-10-CM | POA: Diagnosis not present

## 2019-04-07 DIAGNOSIS — D688 Other specified coagulation defects: Secondary | ICD-10-CM | POA: Diagnosis not present

## 2019-04-07 DIAGNOSIS — K219 Gastro-esophageal reflux disease without esophagitis: Secondary | ICD-10-CM | POA: Diagnosis present

## 2019-04-07 DIAGNOSIS — D509 Iron deficiency anemia, unspecified: Secondary | ICD-10-CM | POA: Diagnosis not present

## 2019-04-07 DIAGNOSIS — I2510 Atherosclerotic heart disease of native coronary artery without angina pectoris: Secondary | ICD-10-CM | POA: Diagnosis present

## 2019-04-07 DIAGNOSIS — Z1159 Encounter for screening for other viral diseases: Secondary | ICD-10-CM

## 2019-04-07 DIAGNOSIS — Z888 Allergy status to other drugs, medicaments and biological substances status: Secondary | ICD-10-CM

## 2019-04-07 DIAGNOSIS — I472 Ventricular tachycardia: Secondary | ICD-10-CM | POA: Diagnosis present

## 2019-04-07 DIAGNOSIS — I953 Hypotension of hemodialysis: Secondary | ICD-10-CM | POA: Diagnosis present

## 2019-04-07 DIAGNOSIS — I447 Left bundle-branch block, unspecified: Secondary | ICD-10-CM | POA: Diagnosis present

## 2019-04-07 DIAGNOSIS — I10 Essential (primary) hypertension: Secondary | ICD-10-CM | POA: Diagnosis present

## 2019-04-07 DIAGNOSIS — E785 Hyperlipidemia, unspecified: Secondary | ICD-10-CM | POA: Diagnosis present

## 2019-04-07 DIAGNOSIS — Z66 Do not resuscitate: Secondary | ICD-10-CM | POA: Diagnosis present

## 2019-04-07 DIAGNOSIS — Z950 Presence of cardiac pacemaker: Secondary | ICD-10-CM

## 2019-04-07 LAB — BASIC METABOLIC PANEL
Anion gap: 13 (ref 5–15)
BUN: 8 mg/dL (ref 8–23)
CO2: 28 mmol/L (ref 22–32)
Calcium: 8.8 mg/dL — ABNORMAL LOW (ref 8.9–10.3)
Chloride: 90 mmol/L — ABNORMAL LOW (ref 98–111)
Creatinine, Ser: 3.5 mg/dL — ABNORMAL HIGH (ref 0.61–1.24)
GFR calc Af Amer: 18 mL/min — ABNORMAL LOW (ref >60)
GFR calc non Af Amer: 16 mL/min — ABNORMAL LOW (ref >60)
Glucose, Bld: 214 mg/dL — ABNORMAL HIGH (ref 70–99)
Potassium: 2.7 mmol/L — CL (ref 3.5–5.1)
Sodium: 131 mmol/L — ABNORMAL LOW (ref 135–145)

## 2019-04-07 LAB — CBC
HCT: 36.2 % — ABNORMAL LOW (ref 39.0–52.0)
Hemoglobin: 11.8 g/dL — ABNORMAL LOW (ref 13.0–17.0)
MCH: 31.7 pg (ref 26.0–34.0)
MCHC: 32.6 g/dL (ref 30.0–36.0)
MCV: 97.3 fL (ref 80.0–100.0)
Platelets: 257 10*3/uL (ref 150–400)
RBC: 3.72 MIL/uL — ABNORMAL LOW (ref 4.22–5.81)
RDW: 14.9 % (ref 11.5–15.5)
WBC: 9.9 10*3/uL (ref 4.0–10.5)
nRBC: 0 % (ref 0.0–0.2)

## 2019-04-07 LAB — TROPONIN I (HIGH SENSITIVITY): Troponin I (High Sensitivity): 5 ng/L (ref <18)

## 2019-04-07 MED ORDER — POTASSIUM CHLORIDE CRYS ER 20 MEQ PO TBCR
80.0000 meq | EXTENDED_RELEASE_TABLET | Freq: Once | ORAL | Status: AC
  Administered 2019-04-07: 80 meq via ORAL
  Filled 2019-04-07: qty 4

## 2019-04-07 MED ORDER — SODIUM CHLORIDE 0.9% FLUSH: 3.0000 mL | Freq: Once | INTRAVENOUS | Status: DC
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
## 2019-04-07 NOTE — ED Triage Notes (Signed)
Patient brought in by daughter following syncopal episode as patient was getting out of the car about 45 minutes ago. Patient had dialysis today and reports he felt lightheaded and nauseous. He vomited once, and lost consciousness for a few seconds per daughter - did not hit his head, no injuries. Patient woke up quickly, but it took a few minutes for him to come around. He is A&O x 4. Lightheadedness with position changes, but denies other symptoms at this time.

## 2019-04-08 ENCOUNTER — Encounter (HOSPITAL_COMMUNITY): Payer: Self-pay | Admitting: *Deleted

## 2019-04-08 ENCOUNTER — Inpatient Hospital Stay (HOSPITAL_COMMUNITY): Payer: Medicare HMO

## 2019-04-08 DIAGNOSIS — N186 End stage renal disease: Secondary | ICD-10-CM | POA: Diagnosis not present

## 2019-04-08 DIAGNOSIS — I472 Ventricular tachycardia: Secondary | ICD-10-CM | POA: Diagnosis not present

## 2019-04-08 DIAGNOSIS — I5033 Acute on chronic diastolic (congestive) heart failure: Secondary | ICD-10-CM | POA: Diagnosis present

## 2019-04-08 DIAGNOSIS — E7800 Pure hypercholesterolemia, unspecified: Secondary | ICD-10-CM | POA: Diagnosis present

## 2019-04-08 DIAGNOSIS — D631 Anemia in chronic kidney disease: Secondary | ICD-10-CM | POA: Diagnosis not present

## 2019-04-08 DIAGNOSIS — M898X9 Other specified disorders of bone, unspecified site: Secondary | ICD-10-CM | POA: Diagnosis present

## 2019-04-08 DIAGNOSIS — K219 Gastro-esophageal reflux disease without esophagitis: Secondary | ICD-10-CM | POA: Diagnosis present

## 2019-04-08 DIAGNOSIS — I2510 Atherosclerotic heart disease of native coronary artery without angina pectoris: Secondary | ICD-10-CM | POA: Diagnosis present

## 2019-04-08 DIAGNOSIS — R55 Syncope and collapse: Secondary | ICD-10-CM

## 2019-04-08 DIAGNOSIS — E1122 Type 2 diabetes mellitus with diabetic chronic kidney disease: Secondary | ICD-10-CM | POA: Diagnosis present

## 2019-04-08 DIAGNOSIS — Z950 Presence of cardiac pacemaker: Secondary | ICD-10-CM

## 2019-04-08 DIAGNOSIS — J939 Pneumothorax, unspecified: Secondary | ICD-10-CM | POA: Diagnosis not present

## 2019-04-08 DIAGNOSIS — E876 Hypokalemia: Secondary | ICD-10-CM

## 2019-04-08 DIAGNOSIS — E785 Hyperlipidemia, unspecified: Secondary | ICD-10-CM | POA: Diagnosis present

## 2019-04-08 DIAGNOSIS — Z992 Dependence on renal dialysis: Secondary | ICD-10-CM | POA: Diagnosis not present

## 2019-04-08 DIAGNOSIS — Z7982 Long term (current) use of aspirin: Secondary | ICD-10-CM | POA: Diagnosis not present

## 2019-04-08 DIAGNOSIS — Z1159 Encounter for screening for other viral diseases: Secondary | ICD-10-CM | POA: Diagnosis not present

## 2019-04-08 DIAGNOSIS — E861 Hypovolemia: Secondary | ICD-10-CM | POA: Diagnosis present

## 2019-04-08 DIAGNOSIS — I10 Essential (primary) hypertension: Secondary | ICD-10-CM | POA: Diagnosis not present

## 2019-04-08 DIAGNOSIS — Z888 Allergy status to other drugs, medicaments and biological substances status: Secondary | ICD-10-CM | POA: Diagnosis not present

## 2019-04-08 DIAGNOSIS — I953 Hypotension of hemodialysis: Secondary | ICD-10-CM | POA: Diagnosis present

## 2019-04-08 DIAGNOSIS — I132 Hypertensive heart and chronic kidney disease with heart failure and with stage 5 chronic kidney disease, or end stage renal disease: Secondary | ICD-10-CM | POA: Diagnosis present

## 2019-04-08 DIAGNOSIS — N2581 Secondary hyperparathyroidism of renal origin: Secondary | ICD-10-CM | POA: Diagnosis not present

## 2019-04-08 DIAGNOSIS — E119 Type 2 diabetes mellitus without complications: Secondary | ICD-10-CM | POA: Diagnosis not present

## 2019-04-08 DIAGNOSIS — Z66 Do not resuscitate: Secondary | ICD-10-CM | POA: Diagnosis present

## 2019-04-08 DIAGNOSIS — I447 Left bundle-branch block, unspecified: Secondary | ICD-10-CM | POA: Diagnosis present

## 2019-04-08 LAB — TROPONIN I (HIGH SENSITIVITY): Troponin I (High Sensitivity): 9 ng/L (ref <18)

## 2019-04-08 LAB — BASIC METABOLIC PANEL
Anion gap: 10 (ref 5–15)
BUN: 13 mg/dL (ref 8–23)
CO2: 30 mmol/L (ref 22–32)
Calcium: 9.2 mg/dL (ref 8.9–10.3)
Chloride: 95 mmol/L — ABNORMAL LOW (ref 98–111)
Creatinine, Ser: 4.85 mg/dL — ABNORMAL HIGH (ref 0.61–1.24)
GFR calc Af Amer: 12 mL/min — ABNORMAL LOW (ref >60)
GFR calc non Af Amer: 11 mL/min — ABNORMAL LOW (ref >60)
Glucose, Bld: 136 mg/dL — ABNORMAL HIGH (ref 70–99)
Potassium: 4.3 mmol/L (ref 3.5–5.1)
Sodium: 135 mmol/L (ref 135–145)

## 2019-04-08 LAB — CBC
HCT: 34.8 % — ABNORMAL LOW (ref 39.0–52.0)
Hemoglobin: 11.5 g/dL — ABNORMAL LOW (ref 13.0–17.0)
MCH: 32.1 pg (ref 26.0–34.0)
MCHC: 33 g/dL (ref 30.0–36.0)
MCV: 97.2 fL (ref 80.0–100.0)
Platelets: 253 10*3/uL (ref 150–400)
RBC: 3.58 MIL/uL — ABNORMAL LOW (ref 4.22–5.81)
RDW: 15.1 % (ref 11.5–15.5)
WBC: 10.8 10*3/uL — ABNORMAL HIGH (ref 4.0–10.5)
nRBC: 0 % (ref 0.0–0.2)

## 2019-04-08 LAB — MAGNESIUM: Magnesium: 1.9 mg/dL (ref 1.7–2.4)

## 2019-04-08 LAB — TSH: TSH: 0.273 u[IU]/mL — ABNORMAL LOW (ref 0.350–4.500)

## 2019-04-08 LAB — CREATININE, SERUM
Creatinine, Ser: 4.24 mg/dL — ABNORMAL HIGH (ref 0.61–1.24)
GFR calc Af Amer: 15 mL/min — ABNORMAL LOW (ref >60)
GFR calc non Af Amer: 13 mL/min — ABNORMAL LOW (ref >60)

## 2019-04-08 LAB — MRSA PCR SCREENING: MRSA by PCR: NEGATIVE

## 2019-04-08 LAB — HEMOGLOBIN A1C
Hgb A1c MFr Bld: 6.1 % — ABNORMAL HIGH (ref 4.8–5.6)
Mean Plasma Glucose: 128.37 mg/dL

## 2019-04-08 LAB — GLUCOSE, CAPILLARY: Glucose-Capillary: 90 mg/dL (ref 70–99)

## 2019-04-08 MED ORDER — METOPROLOL TARTRATE 50 MG PO TABS
150.0000 mg | ORAL_TABLET | Freq: Two times a day (BID) | ORAL | Status: DC
  Administered 2019-04-08 – 2019-04-09 (×3): 150 mg via ORAL
  Filled 2019-04-08 (×3): qty 1

## 2019-04-08 MED ORDER — ASPIRIN 81 MG PO CHEW
81.0000 mg | CHEWABLE_TABLET | Freq: Every day | ORAL | Status: DC
  Administered 2019-04-08 – 2019-04-09 (×2): 81 mg via ORAL
  Filled 2019-04-08 (×2): qty 1

## 2019-04-08 MED ORDER — NEPRO/CARBSTEADY PO LIQD
237.0000 mL | Freq: Two times a day (BID) | ORAL | Status: DC
  Administered 2019-04-09: 237 mL via ORAL
  Filled 2019-04-08 (×2): qty 237

## 2019-04-08 MED ORDER — PANTOPRAZOLE SODIUM 20 MG PO TBEC
20.0000 mg | DELAYED_RELEASE_TABLET | Freq: Every day | ORAL | Status: DC
  Administered 2019-04-08 (×2): 20 mg via ORAL
  Filled 2019-04-08 (×2): qty 1

## 2019-04-08 MED ORDER — POLYETHYLENE GLYCOL 3350 17 G PO PACK
17.0000 g | PACK | ORAL | Status: DC
  Filled 2019-04-08 (×2): qty 1

## 2019-04-08 MED ORDER — SODIUM CHLORIDE 0.9% FLUSH
3.0000 mL | Freq: Two times a day (BID) | INTRAVENOUS | Status: DC
  Administered 2019-04-08 – 2019-04-09 (×3): 3 mL via INTRAVENOUS

## 2019-04-08 MED ORDER — AMIODARONE HCL 200 MG PO TABS
200.0000 mg | ORAL_TABLET | Freq: Every day | ORAL | Status: DC
  Administered 2019-04-08 – 2019-04-09 (×2): 200 mg via ORAL
  Filled 2019-04-08 (×2): qty 1

## 2019-04-08 MED ORDER — SODIUM CHLORIDE 0.9% FLUSH: 3.0000 mL | INTRAVENOUS | Status: DC | PRN

## 2019-04-08 MED ORDER — HEPARIN SODIUM (PORCINE) 5000 UNIT/ML IJ SOLN
5000.0000 [IU] | Freq: Three times a day (TID) | INTRAMUSCULAR | Status: DC
  Administered 2019-04-09 (×2): 5000 [IU] via SUBCUTANEOUS
  Filled 2019-04-08 (×2): qty 1

## 2019-04-08 MED ORDER — HEPARIN SODIUM (PORCINE) 5000 UNIT/ML IJ SOLN
5000.0000 [IU] | Freq: Three times a day (TID) | INTRAMUSCULAR | Status: DC
  Filled 2019-04-08: qty 1

## 2019-04-08 MED ORDER — ENOXAPARIN SODIUM 30 MG/0.3ML ~~LOC~~ SOLN
30.0000 mg | SUBCUTANEOUS | Status: DC
  Administered 2019-04-08: 30 mg via SUBCUTANEOUS
  Filled 2019-04-08: qty 0.3

## 2019-04-08 MED ORDER — RENA-VITE PO TABS
1.0000 | ORAL_TABLET | Freq: Every day | ORAL | Status: DC
  Administered 2019-04-08 (×2): 1 via ORAL
  Filled 2019-04-08 (×2): qty 1

## 2019-04-08 MED ORDER — SODIUM CHLORIDE 0.9 % IV SOLN: 250.0000 mL | INTRAVENOUS | Status: DC | PRN

## 2019-04-08 MED ORDER — ROSUVASTATIN CALCIUM 5 MG PO TABS
10.0000 mg | ORAL_TABLET | Freq: Every day | ORAL | Status: DC
  Administered 2019-04-08 (×2): 10 mg via ORAL
  Filled 2019-04-08 (×2): qty 2
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
## 2019-04-08 NOTE — Progress Notes (Signed)
I saw and examined Mr. Tiggs.  I discussed with the resident team.  We have agree on a plan.  I will co-sign their note when available. Briefly, Near syncope.  Went down but did not fully pass out.  Was just coming off dialysis, second day in a row.  Noted to have lowish BP as dialysis was winding down.  Issues: Near syncope: etiology uncertain.  Likely multifactorial.  My best guess is combo of 2 dialysis sessions in a row plus warm day plus possibly overtreated with amio and beta blocker.  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  BP remains low today- so the obvious choice is to decrease the high beta blocker dose.  My only hesitancy is that he has had a recent admit for hypertensive urgency.  I think it would be good to involve cards to interogate pacer and to adjust the doses of beta blocker and/or amiodorone.

## 2019-04-08 NOTE — Progress Notes (Signed)
Family Medicine Teaching Service Daily Progress Note Intern Pager: [phone]  Patient name: William [name] Medical record number: [id number] Date of birth: 13-Oct-1940 Age: 78 y.o. Gender: male  Primary Care Provider: [name], DO Consultants: none Code Status: DNR  Pt Overview and Major Events to Date:  6/26-admit for hypokalemia and syncope  Assessment and Plan: William [name] is a 78 year old man who was admitted for hypokalemia and syncope.  His past medical history is significant for ESRD (HD MWF), HLD, HTN, Biventricular pacing, DMT2, Ventricular Tachycardia  Syncope Thought to be secondary to hypovolemia after dialysis in addition to hypokalemia.  EKG shows left bundle branch block with atrial and ventricular pacing, but no significant change  Regular rate.  Chest x-ray shows left lobe irregularity suspicious for pneumothorax vs. skinfold artifact per radiology reading. We will continue with syncope work-up this morning. Last echo 09/2018, EF 65% w/ G2DD. Recently seen by cardiology on 02/2019, where several of his BP meds were held. Has BV pacemaker. 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 Unable to get further work up in outpatient due to renal dysfunction. TSH low normal on 09/2018. Will repeat.  -Orthostatic vitals -Repeat chest x-ray -echo -Consult nephrology to discuss possibly starting midodrine after dialysis.  -TSH -cardiac monitoring -consult cardiology to discuss metoprolol dosing   Hypokalemia-improved Likely secondary to recent dialysis.  Repleted on admission with 80 mEq of potassium p.o.  Potassium this morning 4.3.  Magnesium 1.9.  Will assess strength/weakness this morning as this is likely contributing to the weakness leading to syncopal episode. -Monitor BMP -Replete PRN  PCI to RCA in 2008  BV Pacing Follows with Dr. Cathie Olden. Pacer last interogated on 03/30/19, NSVT. Takes metoprolol, ASA,  amiodarone at home - continue home metoprolol, amiodarone, ASA, crestor  GERD -cont home prononix  Hypertension Normotensive.  - monitor BP - cont metoprolol  DMT2 Diet Controlled. Last A1C 6.1 on admission.  - monitor daily CBGs  FEN/GI: HHD/CM PPx: D/C heparin, start lovenox per pharmacy  Disposition: Syncope work up  Subjective:  Please see attending note for S&O  Objective: Temp:  [98 F (36.7 C)-98.5 F (36.9 C)] 98.5 F (36.9 C) (06/27 0848) Pulse Rate:  [59-62] 60 (06/27 0848) Resp:  [14-18] 18 (06/27 0848) BP: (98-139)/(51-62) 117/54 (06/27 0848) SpO2:  [93 %-100 %] 93 % (06/27 0848) Weight:  [61.1 kg] 61.1 kg (06/27 0434)   Laboratory: Recent Labs  Lab 04/07/19 1818 04/08/19 0126  WBC 9.9 10.8*  HGB 11.8* 11.5*  HCT 36.2* 34.8*  PLT 257 253   Recent Labs  Lab 04/07/19 1818 04/08/19 0126 04/08/19 0552  NA 131*  --  135  K 2.7*  --  4.3  CL 90*  --  95*  CO2 28  --  30  BUN 8  --  13  CREATININE 3.50* 4.24* 4.85*  CALCIUM 8.8*  --  9.2  GLUCOSE 214*  --  136*   Imaging/Diagnostic Tests:  Dg Chest Portable 1 View  Result Date: 04/07/2019 CLINICAL DATA:  Assess for volume overload EXAM: PORTABLE CHEST 1 VIEW COMPARISON:  11/18/2018, 11/08/2018 FINDINGS: Left-sided pacing device as before. No pleural effusion. Normal cardiomediastinal silhouette with aortic atherosclerosis. Vascular stent in the right upper extremity. Curvilinear lucency left lateral lower chest. IMPRESSION: 1. Negative for cardiomegaly, pulmonary edema or pleural effusion 2. Curvilinear opacity left lower lateral chest, favor skin fold artifact over pneumothorax. Electronically Signed   By: Donavan Foil  M.D.   On: 04/07/2019 22:20    Matilde Haymaker, MD 04/08/2019, 11:01 AM PGY-1, [place] Intern pager: [phone], text pages welcome

## 2019-04-08 NOTE — ED Notes (Signed)
Attempted to give report, secretary stated currently in the middle of an code blue.
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
## 2019-04-09 DIAGNOSIS — I472 Ventricular tachycardia: Secondary | ICD-10-CM

## 2019-04-09 DIAGNOSIS — I10 Essential (primary) hypertension: Secondary | ICD-10-CM

## 2019-04-09 LAB — BASIC METABOLIC PANEL
Anion gap: 11 (ref 5–15)
BUN: 23 mg/dL (ref 8–23)
CO2: 26 mmol/L (ref 22–32)
Calcium: 9.2 mg/dL (ref 8.9–10.3)
Chloride: 93 mmol/L — ABNORMAL LOW (ref 98–111)
Creatinine, Ser: 6.25 mg/dL — ABNORMAL HIGH (ref 0.61–1.24)
GFR calc Af Amer: 9 mL/min — ABNORMAL LOW (ref >60)
GFR calc non Af Amer: 8 mL/min — ABNORMAL LOW (ref >60)
Glucose, Bld: 88 mg/dL (ref 70–99)
Potassium: 3.8 mmol/L (ref 3.5–5.1)
Sodium: 130 mmol/L — ABNORMAL LOW (ref 135–145)

## 2019-04-09 LAB — CBC
HCT: 34.3 % — ABNORMAL LOW (ref 39.0–52.0)
Hemoglobin: 11.5 g/dL — ABNORMAL LOW (ref 13.0–17.0)
MCH: 31.9 pg (ref 26.0–34.0)
MCHC: 33.5 g/dL (ref 30.0–36.0)
MCV: 95.3 fL (ref 80.0–100.0)
Platelets: 257 10*3/uL (ref 150–400)
RBC: 3.6 MIL/uL — ABNORMAL LOW (ref 4.22–5.81)
RDW: 15 % (ref 11.5–15.5)
WBC: 8.2 10*3/uL (ref 4.0–10.5)
nRBC: 0 % (ref 0.0–0.2)

## 2019-04-09 MED ORDER — DICYCLOMINE HCL 10 MG PO CAPS
10.0000 mg | ORAL_CAPSULE | Freq: Once | ORAL | Status: AC
  Administered 2019-04-09: 10 mg via ORAL
  Filled 2019-04-09: qty 1

## 2019-04-09 NOTE — Progress Notes (Signed)
[facility] Progress Note   Assessment/Plan: 1. Hypokalemia post HD 6/26 likely due to two back to back HD treatments- K up to 4.3 Sat and 3.8 Sunday. 2. Near syncope - most likely hypotensive episode is iatrogenic related to hypovolemia I.e too much fluid erroneously removed at dialysis.  He at the minimum, had too large a goal entered in the machine for removal and may have had a weight error as well.  CXR clear - favor giving additional volume if BP drops though seems ok now- usual pre and and post HD BP are in the 120s- 130 range. I don't think he needs midodrine at this point.  The dialysis center will need to do pre and post STANDING BPs going forward. Discussed with patient how he can check the dialysis staff's math when they put the UF goals in the dialysis machine. 3. ESRD -  MWF next HD Monday encourage POs for now- 1100 recorded for Saturday.  Lab today K 3.8  4. Anemia  - hgb 11.5 stable  5. Metabolic bone disease -   6. Nutrition - heart health carb mod 7. Hx NSVT, PM, on amio, MTP - avoid low K - will have RD at dialysis unit talk with daughter about foods -r may need to change to 3 K bath.- in this instance th problem is like back to back treatments but at times pre HD monthly K is < 4 8. Disp -hopeful for d/c today.  If d/c in the am could return to use HD Monday afternoon.   Myriam Jacobson, PA-C [place] [phone] 04/09/2019,9:28 AM  LOS: 1 day   Subjective:   Notes being disrupted at time throughout the night but staff doing their various functions which interferes with sleep.  Eating breakfast. Asking questions about where to get more information about how dialysis works.  Discussed resources. Objective Vitals:   04/08/19 0848 04/08/19 1700 04/08/19 2049 04/09/19 0603  BP: (!) 117/54 (!) 121/56 (!) 127/55 (!) 128/59  Pulse: 60 64 60 (!) 58  Resp: 18 18 18 16   Temp: 98.5 F (36.9 C) 98 F (36.7 C) 98.5 F (36.9 C) 98.3 F (36.8  C)  TempSrc: Oral Oral Oral Oral  SpO2: 93% 96% 100% 100%  Weight:   60.6 kg   Height:       Physical Exam General: NAD conversant. Heart: RRR 2/6 murmur Lungs: no rales Abdomen: soft NT Extremities: no LE edema Dialysis Access: right upper AVGG + bruit   Additional Objective Labs: Basic Metabolic Panel: Recent Labs  Lab 04/07/19 1818 04/08/19 0126 04/08/19 0552 04/09/19 0522  NA 131*  --  135 130*  K 2.7*  --  4.3 3.8  CL 90*  --  95* 93*  CO2 28  --  30 26  GLUCOSE 214*  --  136* 88  BUN 8  --  13 23  CREATININE 3.50* 4.24* 4.85* 6.25*  CALCIUM 8.8*  --  9.2 9.2   Liver Function Tests: No results for input(s): AST, ALT, ALKPHOS, BILITOT, PROT, ALBUMIN in the last 168 hours. No results for input(s): LIPASE, AMYLASE in the last 168 hours. CBC: Recent Labs  Lab 04/07/19 1818 04/08/19 0126 04/09/19 0522  WBC 9.9 10.8* 8.2  HGB 11.8* 11.5* 11.5*  HCT 36.2* 34.8* 34.3*  MCV 97.3 97.2 95.3  PLT 257 253 257   Blood Culture    Component Value Date/Time   SDES URINE, RANDOM 10/05/2018 1102   SPECREQUEST NONE  10/05/2018 1102   CULT (A) 10/05/2018 1102    <10,000 COLONIES/mL >=100,000 COLONIES/mL Performed at [facility] [address], [place], [place] [address]    REPTSTATUS 10/06/2018 FINAL 10/05/2018 1102    Cardiac Enzymes: No results for input(s): CKTOTAL, CKMB, CKMBINDEX, TROPONINI in the last 168 hours. CBG: Recent Labs  Lab 04/08/19 0206  GLUCAP 90   Iron Studies: No results for input(s): IRON, TIBC, TRANSFERRIN, FERRITIN in the last 72 hours. Lab Results  Component Value Date   INR 1.0 03/30/2007   INR 0.9 03/21/2007   Studies/Results: Dg Chest 2 View  Result Date: 04/08/2019 CLINICAL DATA:  Pneumothorax EXAM: CHEST - 2 VIEW COMPARISON:  04/07/2019 FINDINGS: No evidence of pneumothorax; previously suspected linear interface about the left lung base likely skin or garment fold and not appreciated on current examination. No acute  abnormality of the lungs. Left chest multi lead pacer. IMPRESSION: No evidence of pneumothorax; previously suspected linear interface about the left lung base likely skin or garment fold and not appreciated on current examination. No acute abnormality of the lungs. Left chest multi lead pacer. Electronically Signed   By: Eddie Candle M.D.   On: 04/08/2019 14:38   Dg Chest Portable 1 View  Result Date: 04/07/2019 CLINICAL DATA:  Assess for volume overload EXAM: PORTABLE CHEST 1 VIEW COMPARISON:  11/18/2018, 11/08/2018 FINDINGS: Left-sided pacing device as before. No pleural effusion. Normal cardiomediastinal silhouette with aortic atherosclerosis. Vascular stent in the right upper extremity. Curvilinear lucency left lateral lower chest. IMPRESSION: 1. Negative for cardiomegaly, pulmonary edema or pleural effusion 2. Curvilinear opacity left lower lateral chest, favor skin fold artifact over pneumothorax. Electronically Signed   By: Donavan Foil M.D.   On: 04/07/2019 22:20   Medications: . sodium chloride     . amiodarone  200 mg Oral Daily  . aspirin  81 mg Oral Daily  . feeding supplement (NEPRO CARB STEADY)  237 mL Oral BID BM  . heparin  5,000 Units Subcutaneous Q8H  . metoprolol tartrate  150 mg Oral BID  . multivitamin  1 tablet Oral QHS  . pantoprazole  20 mg Oral QHS  . polyethylene glycol  17 g Oral QODAY  . rosuvastatin  10 mg Oral q1800  . sodium chloride flush  3 mL Intravenous Q12H

## 2019-04-09 NOTE — Evaluation (Signed)
Physical Therapy Evaluation & Discharge Patient Details Name: William [name] MRN: [id number] DOB: 1940/12/04 Today's Date: 04/09/2019   History of Present Illness  Pt is a 78 y.o. male admitted 04/07/19 with syncopal episode after dialysis. Worked up for hypokalemia and potential tachy brady syndrome. PMH includes ESRD (HD MWF), DM2, HTN, pacemaker.    Clinical Impression  Patient evaluated by Physical Therapy with no further acute PT needs identified. PTA, pt indep and daughter is living with him to assist as needed. Today, pt indep with mobility; asymptomatic throughout session. Negative orthostatic hypotension. Encouraged continued hallway ambulation during hospital admission. All education has been completed and the patient has no further questions. Acute PT is signing off. Thank you for this referral.    Follow Up Recommendations No PT follow up;Supervision - Intermittent    Equipment Recommendations  None recommended by PT    Recommendations for Other Services       Precautions / Restrictions Precautions Precautions: Fall Restrictions Weight Bearing Restrictions: No      Mobility  Bed Mobility Overal bed mobility: Independent                Transfers Overall transfer level: Independent Equipment used: None                Ambulation/Gait Ambulation/Gait assistance: Independent Gait Distance (Feet): 400 Feet Assistive device: None Gait Pattern/deviations: Step-through pattern;Decreased stride length Gait velocity: Decreased Gait velocity interpretation: 1.31 - 2.62 ft/sec, indicative of limited community ambulator General Gait Details: Slow, steady gait without overt instability or LOB  Stairs Stairs: (Pt declined)          Wheelchair Mobility    Modified Rankin (Stroke Patients Only)       Balance Overall balance assessment: No apparent balance deficits (not formally assessed)                                            Pertinent Vitals/Pain Pain Assessment: No/denies pain    Home Living Family/patient expects to be discharged to:: Private residence Living Arrangements: Children Available Help at Discharge: Family;Available 24 hours/day Type of Home: House Home Access: Stairs to enter Entrance Stairs-Rails: Psychiatric nurse of Steps: 2 Home Layout: Multi-level;Able to live on main level with bedroom/bathroom Home Equipment: Shower seat - built in;Hand held Tourist information centre manager - 2 wheels;Cane - single point Additional Comments: pt's daughter lives with him and other daughter close by    Prior Function Level of Independence: Independent   Gait / Transfers Assistance Needed: Indep with ambulation, sometimes uses RW. Daughter drives to appointments  ADL's / Homemaking Assistance Needed: Indep with ADLs, does light cooking        Hand Dominance   Dominant Hand: Right    Extremity/Trunk Assessment   Upper Extremity Assessment Upper Extremity Assessment: Overall WFL for tasks assessed    Lower Extremity Assessment Lower Extremity Assessment: Overall WFL for tasks assessed    Cervical / Trunk Assessment Cervical / Trunk Assessment: Normal  Communication   Communication: No difficulties  Cognition Arousal/Alertness: Awake/alert Behavior During Therapy: WFL for tasks assessed/performed Overall Cognitive Status: Within Functional Limits for tasks assessed                                        General Comments General  comments (skin integrity, edema, etc.): Negative orthostatic hypotension; pt asymptomatic throughout session    Exercises     Assessment/Plan    PT Assessment Patent does not need any further PT services  PT Problem List         PT Treatment Interventions      PT Goals (Current goals can be found in the Care Plan section)  Acute Rehab PT Goals Patient Stated Goal: to remain independent PT Goal Formulation: All assessment and  education complete, DC therapy    Frequency     Barriers to discharge        Co-evaluation PT/OT/SLP Co-Evaluation/Treatment: Yes Reason for Co-Treatment: For patient/therapist safety;To address functional/ADL transfers PT goals addressed during session: Mobility/safety with mobility;Balance OT goals addressed during session: ADL's and self-care       AM-PAC PT "6 Clicks" Mobility  Outcome Measure Help needed turning from your back to your side while in a flat bed without using bedrails?: None Help needed moving from lying on your back to sitting on the side of a flat bed without using bedrails?: None Help needed moving to and from a bed to a chair (including a wheelchair)?: None Help needed standing up from a chair using your arms (e.g., wheelchair or bedside chair)?: None Help needed to walk in hospital room?: None Help needed climbing 3-5 steps with a railing? : A Little 6 Click Score: 23    End of Session   Activity Tolerance: Patient tolerated treatment well Patient left: in chair;with call bell/phone within reach Nurse Communication: Mobility status PT Visit Diagnosis: Other abnormalities of gait and mobility (R26.89)    Time: 1005-1030 PT Time Calculation (min) (ACUTE ONLY): 25 min   Charges:   PT Evaluation $PT Eval Moderate Complexity: Dolores, PT, DPT Acute Rehabilitation Services  Pager [phone] Office [place] 04/09/2019, 12:04 PM

## 2019-04-09 NOTE — Progress Notes (Signed)
Family Medicine Teaching Service Daily Progress Note Intern Pager: [phone]  Patient name: William [name] Medical record number: [id number] Date of birth: September 23, 1941 Age: 78 y.o. Gender: male  Primary Care Provider: [name], DO Consultants: None Code Status: DNR  Pt Overview and Major Events to Date:  Admitted to [facility] 6/26  Assessment and Plan: William [name] is a 78 year old man who was admitted for hypokalemia and syncope.  His past medical history is significant for ESRD (HD MWF), HLD, HTN, Biventricular pacing, DMT2, Ventricular Tachycardia  Syncope Etiology unknown but likely multifactorial. Recently had 2 dialysis sessions and possible overtreatment with BB and amio. Can also consider arrythmia given history of non sustained V tach. EKG shows left bundle branch block with atrial and ventricular pacing, but no significant change. Echo 09/2018, EF 65% w/ G2DD. Recently seen by cardiology on 02/2019, where several of his BP meds were held. Has BV pacemaker. 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 Unable to get further work up in outpatient due to renal dysfunction. TSH low normal on 09/2018. Will repeat.  -Orthostatic vitals -Repeat chest x-ray -echo -Consult nephrology to discuss possibly starting midodrine after dialysis.  -TSH -cardiac monitoring -cardiology consulted to interrogate pacer and adjust dose of metoprolol and amiodarone   Hypokalemia-resolved 2/2 two back to back HD treatments. Repleted on admission with 80 mEq of potassium p.o.  Potassium this morning 4.3.  Magnesium 1.9.    -Monitor BMP -Replete pere nephrology   ESRD on MWF HD Patient with h/o of recent HD session. Next HD on Monday  -nephrology consulted, appreciate recommendations   PCI to RCA in 2008  BV Pacing Follows with Dr. Cathie Olden. Pacer last interogated on 03/30/19, NSVT. Takes metoprolol, ASA, amiodarone at home - continue home  metoprolol, amiodarone, ASA, crestor - cardiology consulted, appreciate recommendations   GERD -cont home prononix  Hypertension with recent episodes of hypotension  Normotensive, BP 128/59. No midodrine needed currently. Recommended for HD staff to do pre- and post standing BPs - monitor BP - cont metoprolol - HD recommendations on dc summary   DMT2 Diet Controlled. Last A1C 6.1 on admission. Glucose 88 on BMP -continue to monitor   FEN/GI: HHD/CM PPx: D/C heparin, start lovenox per pharmacy  Disposition: pending PT/OT evaluation   Subjective:  Patient reports that he feels well. Denies CP or SOB. Reports that he walked with OT and was not weak or dizzy.   Objective: Temp:  [98 F (36.7 C)-98.5 F (36.9 C)] 98.4 F (36.9 C) (06/28 0954) Pulse Rate:  [58-64] 60 (06/28 1306) Resp:  [16-18] 18 (06/28 0954) BP: (112-128)/(51-71) 113/71 (06/28 1306) SpO2:  [96 %-100 %] 100 % (06/28 1306) Weight:  [60.6 kg] 60.6 kg (06/27 2049) Physical Exam: General: awake and alert, sitting up in chair watching television,  NAD  Cardiovascular: RRR, no MRG  Respiratory: CTAB, no wheezes, rales, or rhonchi  Abdomen: soft, non tender, non distended, bowel sounds present  Extremities: no edema, non tender   Laboratory: Recent Labs  Lab 04/07/19 1818 04/08/19 0126 04/09/19 0522  WBC 9.9 10.8* 8.2  HGB 11.8* 11.5* 11.5*  HCT 36.2* 34.8* 34.3*  PLT 257 253 257   Recent Labs  Lab 04/07/19 1818 04/08/19 0126 04/08/19 0552 04/09/19 0522  NA 131*  --  135 130*  K 2.7*  --  4.3 3.8  CL 90*  --  95* 93*  CO2 28  --  30 26  BUN  8  --  13 23  CREATININE 3.50* 4.24* 4.85* 6.25*  CALCIUM 8.8*  --  9.2 9.2  GLUCOSE 214*  --  136* 88     Imaging/Diagnostic Tests: Dg Chest 2 View  Result Date: 04/08/2019 CLINICAL DATA:  Pneumothorax EXAM: CHEST - 2 VIEW COMPARISON:  04/07/2019 FINDINGS: No evidence of pneumothorax; previously suspected linear interface about the left lung base  likely skin or garment fold and not appreciated on current examination. No acute abnormality of the lungs. Left chest multi lead pacer. IMPRESSION: No evidence of pneumothorax; previously suspected linear interface about the left lung base likely skin or garment fold and not appreciated on current examination. No acute abnormality of the lungs. Left chest multi lead pacer. Electronically Signed   By: Eddie Candle M.D.   On: 04/08/2019 14:38   Dg Chest Portable 1 View  Result Date: 04/07/2019 CLINICAL DATA:  Assess for volume overload EXAM: PORTABLE CHEST 1 VIEW COMPARISON:  11/18/2018, 11/08/2018 FINDINGS: Left-sided pacing device as before. No pleural effusion. Normal cardiomediastinal silhouette with aortic atherosclerosis. Vascular stent in the right upper extremity. Curvilinear lucency left lateral lower chest. IMPRESSION: 1. Negative for cardiomegaly, pulmonary edema or pleural effusion 2. Curvilinear opacity left lower lateral chest, favor skin fold artifact over pneumothorax. Electronically Signed   By: Donavan Foil M.D.   On: 04/07/2019 22:20     Caroline More, DO 04/09/2019, 1:14 PM PGY-2, [place] Intern pager: [phone], text pages welcome

## 2019-04-09 NOTE — Progress Notes (Signed)
DISCHARGE NOTE  William [name] to be discharged Home per MD order. Patient verbalized understanding.  Skin clean, dry and intact without evidence of skin break down, no evidence of skin tears noted. IV catheter discontinued intact. Site without signs and symptoms of complications. Dressing and pressure applied. Pt denies pain at the site currently. No complaints noted.  Patient free of lines, drains, and wounds.   Discharge packet assembled. An After Visit Summary (AVS) was printed  And given to patient. Patient escorted via wheelchair and discharged to home via private auto.  Babs Sciara, RN

## 2019-04-09 NOTE — Discharge Instructions (Signed)
If you have further fainting spells please come back to the ED. Please follow up with your PCP on 6/30. You also have a cardiology follow up appointment on 7/29
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
## 2019-04-09 NOTE — Progress Notes (Addendum)
Occupational Therapy Evaluation Patient Details Name: William [name] MRN: [id number] DOB: [birth year] Today's Date: 04/09/2019    History of Present Illness Pt is a 78 y.o. male admitted 04/07/19 with syncopal episode after dialysis. Worked up for hypokalemia and potential tachy brady syndrome. PMH includes ESRD (HD MWF), DM2, HTN, pacemaker.   Clinical Impression   PTA, pt was living at home with his daughter, and was independent with ADL/IADL and functional mobility with intermittent use of RW. Pt's daugthers were driving pt to/from HD. Pt currently functioning at baseline, completing ADL and functional mobility independently. Educated pt to take his time with transfers and monitor self for feelings of dizziness and lightheadedness to maximize safety. Patient evaluated by Occupational Therapy with no further acute OT needs identified. All education has been completed and the patient has no further questions. See below for any follow-up Occupational Therapy or equipment needs. OT to sign off. Thank you for referral.   Orthostatic Blood Pressure: RN aware  Supine: 124/58  Sitting: 124/55  Standing: 121/58  Standing 3 min: 118/55  Return to sitting: 127/63     Follow Up Recommendations  No OT follow up    Equipment Recommendations  None recommended by OT    Recommendations for Other Services       Precautions / Restrictions Precautions Precautions: Fall      Mobility Bed Mobility Overal bed mobility: Independent                Transfers Overall transfer level: Independent Equipment used: None                  Balance Overall balance assessment: Independent                                         ADL either performed or assessed with clinical judgement   ADL Overall ADL's : At baseline                                       General ADL Comments: pt appears to be at baseline with ADL, completing ADL modified  independently-independently     Vision Baseline Vision/History: Wears glasses Wears Glasses: At all times Patient Visual Report: No change from baseline       Perception     Praxis      Pertinent Vitals/Pain Pain Assessment: No/denies pain     Hand Dominance Right   Extremity/Trunk Assessment Upper Extremity Assessment Upper Extremity Assessment: Overall WFL for tasks assessed   Lower Extremity Assessment Lower Extremity Assessment: Defer to PT evaluation   Cervical / Trunk Assessment Cervical / Trunk Assessment: Normal   Communication Communication Communication: No difficulties   Cognition Arousal/Alertness: Awake/alert Behavior During Therapy: WFL for tasks assessed/performed Overall Cognitive Status: Within Functional Limits for tasks assessed                                     General Comments  pt ambulated in hallway and around room independently;educated pt on importance of taking his time between transfers to monitor if he feels dizzy/lightheaded;orthostatics taken, pt negative for orthostatic hypotension;    Exercises     Shoulder Instructions      Home Living Family/patient expects to  be discharged to:: Private residence Living Arrangements: Alone Available Help at Discharge: Family;Available 24 hours/day Type of Home: House Home Access: Stairs to enter [employer] of Steps: 2 Entrance Stairs-Rails: Right;Left Home Layout: Laundry or work area in basement;Two level Alternate Level Stairs-Number of Steps: flight Alternate Level Stairs-Rails: Right Bathroom Shower/Tub: Walk-in shower;Tub/shower unit   Bathroom Toilet: Standard Bathroom Accessibility: Yes How Accessible: Accessible via walker Home Equipment: Shower seat - built in;Hand held Tourist information centre manager - 2 wheels;Cane - single point   Additional Comments: pt's daughter lives with him and other daughter close by      Prior Functioning/Environment Level of  Independence: Needs assistance  Gait / Transfers Assistance Needed: pt was using RW prn;daughters drive pt to/from HD ADL's / Homemaking Assistance Needed: pt was independent with ADL            OT Problem List: Decreased knowledge of precautions;Impaired balance (sitting and/or standing)      OT Treatment/Interventions:      OT Goals(Current goals can be found in the care plan section) Acute Rehab OT Goals Patient Stated Goal: to remain independent OT Goal Formulation: With patient Time For Goal Achievement: 04/23/19 Potential to Achieve Goals: Good  OT Frequency:     Barriers to D/C:            Co-evaluation PT/OT/SLP Co-Evaluation/Treatment: Yes Reason for Co-Treatment: For patient/therapist safety;To address functional/ADL transfers   OT goals addressed during session: ADL's and self-care      AM-PAC OT "6 Clicks" Daily Activity     Outcome Measure Help from another person eating meals?: None Help from another person taking care of personal grooming?: None Help from another person toileting, which includes using toliet, bedpan, or urinal?: None Help from another person bathing (including washing, rinsing, drying)?: None Help from another person to put on and taking off regular upper body clothing?: None Help from another person to put on and taking off regular lower body clothing?: None 6 Click Score: 24   End of Session Nurse Communication: Mobility status  Activity Tolerance: Patient tolerated treatment well Patient left: in chair;with call bell/phone within reach  OT Visit Diagnosis: Other abnormalities of gait and mobility (R26.89)                Time: 1005-1030 OT Time Calculation (min): 25 min Charges:  OT General Charges $OT Visit: 1 Visit OT Evaluation $OT Eval Low Complexity: [place] OTR/L Acute Rehabilitation Services Office: [place] 04/09/2019, 11:01 AM

## 2019-04-09 NOTE — Discharge Summary (Signed)
[facility] Discharge Summary  Patient name: William [name] Medical record number: [id number] Date of birth: 1941-01-17 Age: 78 y.o. Gender: male Date of Admission: 04/07/2019  Date of Discharge: 04/09/2019 Admitting Physician: Gwynne Edinger, MD  Primary Care Provider: [name], DO Consultants: cardiology, nephrology   Indication for Hospitalization: syncope   Discharge Diagnoses/Problem List:  Syncope Hypokalemia ESRD on MWF HD PCI to RCA in 2008, BV pacing GERD HTN w/ recent hypotension  T2DM  Disposition: home   Discharge Condition: improving   Discharge Exam:  General: awake and alert, sitting up in chair watching television,  NAD  Cardiovascular: RRR, no MRG  Respiratory: CTAB, no wheezes, rales, or rhonchi  Abdomen: soft, non tender, non distended, bowel sounds present  Extremities: no edema, non tender   Brief Hospital Course:  William [name] is a 78 y.o. male presenting after syncopal episode. PMHx is significant for ESRD (HD MWF), HLD, HTN, Biventricular pacing, DMT2, Ventricular Tachycardia  Syncope Resented with near syncopal episode after dialysis.  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  Chest x-ray showed suspicion for pneumothorax versus skinfold artifact.  Patient with recent echo from 09/2018 showing EF 65% with grade 2 diastolic dysfunction.  Given history of BV pacemaker as well as ventricular tachycardia and metoprolol and amiodarone dosing, cardiology was consulted.  Recommended continuing current dosing of amiodarone and metoprolol.  Follow-up made for July 29 for device interrogation.  Was also recommended by cardiology to hold amlodipine..  Neurology was also consulted and felt that hypotension and near syncope was iatrogenic related to hypovolemia from fluid removed during dialysis.  Recommended that patient have  pre-and post standing blood pressures going forward at dialysis center.  Abdominal pain During admission patient had abdominal pain.  States that he does have history of getting stomach ulcers.  Labs are stable with hemoglobin of 11.5.  Patient otherwise well-appearing.  Normal abdominal exam on day of discharge.  Recommended outpatient follow-up with PCP.  Issues for Follow Up:  1. Pre and Post standing BP's during HD 2. Patient with nausea, QTc prolonged so no zofran given. Please follow this up to ensure resolution of symptoms  3. Amlodipine discontinued 2/2 hypotension 4. F/u COVID testing   Significant Procedures: None  Significant Labs and Imaging:  Recent Labs  Lab 04/07/19 1818 04/08/19 0126 04/09/19 0522  WBC 9.9 10.8* 8.2  HGB 11.8* 11.5* 11.5*  HCT 36.2* 34.8* 34.3*  PLT 257 253 257   Recent Labs  Lab 04/07/19 1818 04/08/19 0126 04/08/19 0552 04/09/19 0522  NA 131*  --  135 130*  K 2.7*  --  4.3 3.8  CL 90*  --  95* 93*  CO2 28  --  30 26  GLUCOSE 214*  --  136* 88  BUN 8  --  13 23  CREATININE 3.50* 4.24* 4.85* 6.25*  CALCIUM 8.8*  --  9.2 9.2  MG  --  1.9  --   --      Ref. Range 04/08/2019 13:07  TSH Latest Ref Range: 0.350 - 4.500 uIU/mL 0.273 (L)   Dg Chest 2 View  Result Date: 04/08/2019 CLINICAL DATA:  Pneumothorax EXAM: CHEST - 2 VIEW COMPARISON:  04/07/2019 FINDINGS: No evidence of pneumothorax; previously suspected linear interface about the left lung base likely skin or garment fold and not appreciated on current examination. No acute abnormality of the lungs. Left  chest multi lead pacer. IMPRESSION: No evidence of pneumothorax; previously suspected linear interface about the left lung base likely skin or garment fold and not appreciated on current examination. No acute abnormality of the lungs. Left chest multi lead pacer. Electronically Signed   By: Eddie Candle M.D.   On: 04/08/2019 14:38   Dg Chest Portable 1 View  Result Date:  04/07/2019 CLINICAL DATA:  Assess for volume overload EXAM: PORTABLE CHEST 1 VIEW COMPARISON:  11/18/2018, 11/08/2018 FINDINGS: Left-sided pacing device as before. No pleural effusion. Normal cardiomediastinal silhouette with aortic atherosclerosis. Vascular stent in the right upper extremity. Curvilinear lucency left lateral lower chest. IMPRESSION: 1. Negative for cardiomegaly, pulmonary edema or pleural effusion 2. Curvilinear opacity left lower lateral chest, favor skin fold artifact over pneumothorax. Electronically Signed   By: Donavan Foil M.D.   On: 04/07/2019 22:20     Results/Tests Pending at Time of Discharge:  Unresulted Labs (From admission, onward)    Start     Ordered   04/08/19 0500  Glucose, random  Daily,   R     04/08/19 0024   04/08/19 0059  Novel Coronavirus,NAA,(SEND-OUT TO REF LAB - TAT 24-48 hrs); Hosp Order  (Asymptomatic Patients Labs)  Once,   STAT    Question:  Rule Out  Answer:  Yes   04/08/19 0059   04/08/19 0025  Amiodarone level  Add-on,   AD     04/08/19 0024   04/07/19 1807  Urinalysis, Routine w reflex microscopic  Once,   STAT     04/07/19 1807           Discharge Medications:  Allergies as of 04/09/2019      Reactions   Carvedilol Other (See Comments)   Makes him feel like his pelvis is "grinding" (??) Patient doesn't recall this, however      Medication List    STOP taking these medications   amLODipine 5 MG tablet Commonly known as: NORVASC     TAKE these medications   amiodarone 200 MG tablet Commonly known as: PACERONE Take 1 tablet (200 mg total) by mouth daily. Notes to patient: Tomorrow 04/10/19   aspirin 81 MG chewable tablet Chew 1 tablet (81 mg total) by mouth daily. Notes to patient: Tomorrow 04/10/19   lidocaine-prilocaine cream Commonly known as: EMLA Apply 1 application topically See admin instructions. APPLY SMALL AMOUNT TO ACCESS SITE (AVF) 3 TIMES A WEEK 1 HOUR BEFORE DIALYSIS. COVER WITH OCCLUSIVE DRESSING (SARAN  WRAP) Notes to patient: See instructions   metoprolol tartrate 50 MG tablet Commonly known as: LOPRESSOR Take 3 tablets (150 mg total) by mouth 2 (two) times daily. What changed: additional instructions Notes to patient: This evening 04/09/19   pantoprazole 20 MG tablet Commonly known as: PROTONIX Take 1 tablet (20 mg total) by mouth daily. What changed: when to take this Notes to patient: This evening 04/09/19   polyethylene glycol 17 g packet Commonly known as: MIRALAX / GLYCOLAX Take 17 g by mouth daily. What changed: when to take this Notes to patient: Tomorrow 04/10/19   Renal-Vite 0.8 MG Tabs Take 1 tablet by mouth at bedtime. Notes to patient: This evening at bedtime 04/09/19   rosuvastatin 10 MG tablet Commonly known as: CRESTOR Take 1 tablet (10 mg total) by mouth daily at 6 PM. Notes to patient: This evening 04/09/19   sodium bicarbonate 650 MG tablet Take 1 tablet (650 mg total) by mouth 2 (two) times daily. Notes to patient: This evening 04/09/19  Discharge Instructions: Please refer to Patient Instructions section of EMR for full details.  Patient was counseled important signs and symptoms that should prompt return to medical care, changes in medications, dietary instructions, activity restrictions, and follow up appointments.   Follow-Up Appointments: Follow-up Information    Ouida Sills, Chelsey L, DO. Go on 04/11/2019.   Specialty: Family Medicine Why: @11 :15AM (please arrive at least 15 min early) Contact information: [address] [address] [place] [facility]. Go on 05/10/2019.   Contact information: [place] [id number] Price, [place], DO 04/09/2019, 4:29 PM PGY-2, [place]

## 2019-04-10 DIAGNOSIS — N186 End stage renal disease: Secondary | ICD-10-CM | POA: Diagnosis not present

## 2019-04-10 DIAGNOSIS — D688 Other specified coagulation defects: Secondary | ICD-10-CM | POA: Diagnosis not present

## 2019-04-10 DIAGNOSIS — N2581 Secondary hyperparathyroidism of renal origin: Secondary | ICD-10-CM | POA: Diagnosis not present

## 2019-04-10 DIAGNOSIS — D509 Iron deficiency anemia, unspecified: Secondary | ICD-10-CM | POA: Diagnosis not present

## 2019-04-10 DIAGNOSIS — D631 Anemia in chronic kidney disease: Secondary | ICD-10-CM | POA: Diagnosis not present

## 2019-04-10 DIAGNOSIS — E119 Type 2 diabetes mellitus without complications: Secondary | ICD-10-CM | POA: Diagnosis not present

## 2019-04-11 ENCOUNTER — Telehealth (INDEPENDENT_AMBULATORY_CARE_PROVIDER_SITE_OTHER): Payer: Medicare HMO | Admitting: Student in an Organized Health Care Education/Training Program

## 2019-04-11 ENCOUNTER — Other Ambulatory Visit: Payer: Self-pay

## 2019-04-11 DIAGNOSIS — Z09 Encounter for follow-up examination after completed treatment for conditions other than malignant neoplasm: Secondary | ICD-10-CM | POA: Insufficient documentation

## 2019-04-11 DIAGNOSIS — N186 End stage renal disease: Secondary | ICD-10-CM | POA: Diagnosis not present

## 2019-04-11 DIAGNOSIS — I129 Hypertensive chronic kidney disease with stage 1 through stage 4 chronic kidney disease, or unspecified chronic kidney disease: Secondary | ICD-10-CM | POA: Diagnosis not present

## 2019-04-11 DIAGNOSIS — Z992 Dependence on renal dialysis: Secondary | ICD-10-CM | POA: Diagnosis not present

## 2019-04-11 LAB — NOVEL CORONAVIRUS, NAA (HOSP ORDER, SEND-OUT TO REF LAB; TAT 18-24 HRS): SARS-CoV-2, NAA: NOT DETECTED

## 2019-04-11 LAB — AMIODARONE LEVEL
Amiodarone Lvl: 1.3 ug/mL (ref 1.0–2.5)
N-Desethyl-Amiodarone: 0.8 ug/mL — ABNORMAL LOW (ref 1.0–2.5)

## 2019-04-11 NOTE — Progress Notes (Signed)
[facility] Telemedicine Visit  Patient consented to have virtual visit. Method of visit: Video  Encounter participants: Patient: William [name] - located at home Provider: Richarda Osmond - located at [facility] Others (if applicable): daughter- Aretha Parrot (caregiver, lives with patient)  Chief Complaint: hospital follow up  HPI:  Attempted call with patient and daughter via video. Sent text message reminder and voice called to initiate appointment at about 11:10 and 11:18 for appointment scheduled for 11:15. There was no response. Will attempt to call back again.  Video visit successful ~11:25. Patient endorses feeling fatigued. He is feeling better than when discharged. He has not had a re-occurance of syncope or presyncope. He has had no more episodes of nausea since discharge. Denies abdominal pain. We discussed several follow up topics with patient and his daughter.  Preventing reoccurrence of syncope- he has not had another episode since discharge. Asked patient to reach out to his nephrologist to discuss his symptoms so that they can make an informed decision on his dialysis management. We discussed his medication changes that removed one of his blood pressure medications and will likely contribute to preventing other hypotensive episodes but he will still want to be careful and be monitored closely to prevent reoccurrence. I would have liked to check orthostatic vitals on him today, but he still has a pending COVID test so we decided to have a virtual visit today. The facility endorsed possible 7 day turn around for results. Patient would like another appointment to check this once his results are back. Denies lightheadedness, dizziness, palpitations, flutter, syncopal episodes.  Medications- confirmed medications patient is taking at home with the medications on discharge summary. Patient is on appropriate management. Denies negative side effects to amiodarone. No  new symptomology with dose change in protonix. No known hypertension with discontinuation of amlodipine. Daughter would like advice on adding CoQ10 supplement. I discussed risks/benefits of taking supplements and possible drug interactions and recommended against use but ultimately left the decision up to them.   Nausea- patient had nausea for several days prior to and during admission, intermittently. Denies any reoccurrence since discharge. We discussed avoidance of medications that prolong QTc. Patient then had questions about his specific model of pacemaker to which I asked them to contact his cardiologist for. He has follow up 04/2019.   Weight gain- patient's daughter would like to know what she can do to increase his weight and appetite. Since this can be a very in depth conversation, I asked her to come in for a separate visit to discuss more in detail.   ROS: per HPI  Pertinent PMHx:  Syncope Hypokalemia ESRD on MWF HD PCI to RCA in 2008, BV pacing GERD HTN w/ recent hypotension  T2DM  Exam:  General: appears well, NAD, pleasant  Respiratory: speaking in full sentences, no increased WOB Psych: patient has great comprehension of discussion  Assessment/Plan:  Hospital discharge follow-up Discharged 6/28 for (pre)syncope 2/2 likely hypotension/dehydration main component - virtual visit 2/2 pending COVID screening results so did not obtain repeat vitals. Would recommend obtaining orthostatic vitals at next visit.  - follow up with nephrologist - follow up with cardiologist - follow up with FM for discussion on nutrition/appetite (would consider palliative consult for integrated model of healthcare with a new main caregiver and multiple complex health issues)    Time spent during visit with patient: 26 minutes

## 2019-04-11 NOTE — Assessment & Plan Note (Signed)
Discharged 6/28 for (pre)syncope 2/2 likely hypotension/dehydration main component - virtual visit 2/2 pending COVID screening results so did not obtain repeat vitals. Would recommend obtaining orthostatic vitals at next visit.  - follow up with nephrologist - follow up with cardiologist - follow up with FM for discussion on nutrition/appetite (would consider palliative consult for integrated model of healthcare with a new main caregiver and multiple complex health issues)

## 2019-04-12 DIAGNOSIS — N2581 Secondary hyperparathyroidism of renal origin: Secondary | ICD-10-CM | POA: Diagnosis not present

## 2019-04-12 DIAGNOSIS — E119 Type 2 diabetes mellitus without complications: Secondary | ICD-10-CM | POA: Diagnosis not present

## 2019-04-12 DIAGNOSIS — D631 Anemia in chronic kidney disease: Secondary | ICD-10-CM | POA: Diagnosis not present

## 2019-04-12 DIAGNOSIS — N186 End stage renal disease: Secondary | ICD-10-CM | POA: Diagnosis not present

## 2019-04-12 DIAGNOSIS — D688 Other specified coagulation defects: Secondary | ICD-10-CM | POA: Diagnosis not present

## 2019-04-12 DIAGNOSIS — D509 Iron deficiency anemia, unspecified: Secondary | ICD-10-CM | POA: Diagnosis not present

## 2019-04-20 ENCOUNTER — Ambulatory Visit: Payer: Medicare HMO | Admitting: Family Medicine

## 2019-04-24 NOTE — Progress Notes (Deleted)
   Subjective:    Patient ID: William [name], male    DOB: 12-23-1940, 78 y.o.   MRN: [id number]   CC:  HPI:   Smoking status reviewed  Review of Systems   Objective:  There were no vitals taken for this visit. Vitals and nursing note reviewed  General: well nourished, in no acute distress HEENT: normocephalic, TM's visualized bilaterally, no scleral icterus or conjunctival pallor, no nasal discharge, moist mucous membranes, good dentition without erythema or discharge noted in posterior oropharynx Neck: supple, non-tender, without lymphadenopathy Cardiac: RRR, clear S1 and S2, no murmurs, rubs, or gallops Respiratory: clear to auscultation bilaterally, no increased work of breathing Abdomen: soft, nontender, nondistended, no masses or organomegaly. Bowel sounds present Extremities: no edema or cyanosis. Warm, well perfused. 2+ radial and PT pulses bilaterally Skin: warm and dry, no rashes noted Neuro: alert and oriented, no focal deficits   Assessment & Plan:    No problem-specific Assessment & Plan notes found for this encounter.    No follow-ups on file.   Caroline More, DO, PGY-3

## 2019-04-25 ENCOUNTER — Encounter: Payer: Self-pay | Admitting: Family Medicine

## 2019-04-25 ENCOUNTER — Ambulatory Visit: Payer: Medicare HMO | Admitting: Family Medicine

## 2019-04-25 ENCOUNTER — Ambulatory Visit (INDEPENDENT_AMBULATORY_CARE_PROVIDER_SITE_OTHER): Payer: Medicare HMO | Admitting: Family Medicine

## 2019-04-25 ENCOUNTER — Other Ambulatory Visit: Payer: Self-pay

## 2019-04-25 DIAGNOSIS — I951 Orthostatic hypotension: Secondary | ICD-10-CM | POA: Diagnosis not present

## 2019-04-25 NOTE — Patient Instructions (Signed)
It sounds like William [name] has been experiencing syncope (passing out) from orthostatic hypotension (low blood pressure with position changes).  This is likely all related to him taking blood pressure meds that he may not need anymore in addition to getting a little too much off at dialysis.  For now.  Let's stop the metoprolol altogether and keep an eye on his blood pressure.    I think that he'll be OK off the metoprolol.  Let's follow up in one week to make sure he hasn't had any more episodes.

## 2019-04-25 NOTE — Progress Notes (Signed)
    Subjective:  William [name] is a 78 y.o. male who presents to the [facility] today with a chief complaint of getting lightheaded and passing out after dialysis.   HPI: Orthostatic hypotension Mr. William [name] came to the office with his daughter who is heavily involved in his medical care.  He was diagnosed with end-stage renal disease in the past year and started hemodialysis about 6 months ago.  Prior to starting dialysis, he suffered from severe hypertension which is significantly improved with dialysis.  As a result, his physicians have slowly been withdrawing his blood pressure medication.  His daughter notes that he continues to have symptomatic low blood pressures following dialysis.  He currently goes to dialysis on Monday, Wednesday, Friday.  He often feels lightheaded and has lost consciousness multiple times following dialysis.  These episodes have all been associated with low blood pressure (of note, he does have a pacemaker).  His last episode of syncope after dialysis was 7/13.  His last note from cardiology notes that he has continued metoprolol tartrate twice daily for hypertension.  His systolic blood pressure in clinic today shows a drop in systolic pressure greater than 20 mmHg with position change from lying down to sitting.  His heart rate remains in the 50-60 range  Chief Complaint noted Review of Symptoms - see HPI PMH - Smoking status noted.    Objective:  Physical Exam: BP (!) 101/46   Pulse 60   SpO2 100%    Gen: NAD, resting comfortably.  Cooperative with the conversation and exam.  He permits his daughter to do the majority of the interview. CV: RRR with no murmurs appreciated Pulm: NWOB, CTAB with no crackles, wheezes, or rhonchi GI: Normal bowel sounds present. Soft, Nontender, Nondistended. MSK: no edema, cyanosis, or clubbing noted Skin: warm, dry  No results found for this or any previous visit (from the past 72 hour(s)).   Assessment/Plan:   Orthostatic hypotension Vitals in the office today show positive orthostatic vitals.  Based on his history, this is likely due to remaining on antihypertensive medication although his hypotension has largely resolved with dialysis.  Chart review shows that his metoprolol was provided only for hypertension.  No known history of A. fib/a flutter.  At this time, metoprolol is providing more harm than benefit with regard to his heart failure (metoprolol did not appear to be for heart failure, patient was taking tartrate as opposed to succinate).  Will discontinue metoprolol today and follow-up in 1 week. -DC metoprolol -Continue to monitor blood pressure with and following dialysis -Follow-up in 1 week -Follow closely with nephrology for appropriate volume control with dialysis

## 2019-04-26 DIAGNOSIS — I951 Orthostatic hypotension: Secondary | ICD-10-CM

## 2019-04-26 HISTORY — DX: Orthostatic hypotension: I95.1

## 2019-04-26 NOTE — Assessment & Plan Note (Addendum)
Vitals in the office today show positive orthostatic vitals.  Based on his history, this is likely due to remaining on antihypertensive medication although his hypotension has largely resolved with dialysis.  Chart review shows that his metoprolol was provided only for hypertension.  No known history of A. fib/a flutter.  At this time, metoprolol is providing more harm than benefit with regard to his heart failure (metoprolol did not appear to be for heart failure, patient was taking tartrate as opposed to succinate).  Will discontinue metoprolol today and follow-up in 1 week. -DC metoprolol -Continue to monitor blood pressure with and following dialysis -Follow-up in 1 week -Follow closely with nephrology for appropriate volume control with dialysis

## 2019-05-04 ENCOUNTER — Ambulatory Visit: Payer: Medicare HMO | Admitting: Family Medicine

## 2019-05-04 ENCOUNTER — Telehealth: Payer: Self-pay

## 2019-05-09 ENCOUNTER — Telehealth: Payer: Self-pay

## 2019-05-09 NOTE — Telephone Encounter (Signed)
Spoke with pts daughter about appt on 05/10/19.[name] Pts daughter stated pt will be at dialysis at the time of the appt and needs to reschedule.

## 2019-05-10 ENCOUNTER — Telehealth: Payer: Medicare HMO | Admitting: Internal Medicine

## 2019-05-10 DIAGNOSIS — D688 Other specified coagulation defects: Secondary | ICD-10-CM | POA: Diagnosis not present

## 2019-05-10 DIAGNOSIS — D631 Anemia in chronic kidney disease: Secondary | ICD-10-CM | POA: Diagnosis not present

## 2019-05-10 DIAGNOSIS — E119 Type 2 diabetes mellitus without complications: Secondary | ICD-10-CM | POA: Diagnosis not present

## 2019-05-10 DIAGNOSIS — D509 Iron deficiency anemia, unspecified: Secondary | ICD-10-CM | POA: Diagnosis not present

## 2019-05-10 DIAGNOSIS — N186 End stage renal disease: Secondary | ICD-10-CM | POA: Diagnosis not present

## 2019-05-10 DIAGNOSIS — N2581 Secondary hyperparathyroidism of renal origin: Secondary | ICD-10-CM | POA: Diagnosis not present

## 2019-05-12 DIAGNOSIS — N186 End stage renal disease: Secondary | ICD-10-CM | POA: Diagnosis not present

## 2019-05-12 DIAGNOSIS — I129 Hypertensive chronic kidney disease with stage 1 through stage 4 chronic kidney disease, or unspecified chronic kidney disease: Secondary | ICD-10-CM | POA: Diagnosis not present

## 2019-05-12 DIAGNOSIS — Z992 Dependence on renal dialysis: Secondary | ICD-10-CM | POA: Diagnosis not present

## 2019-05-15 DIAGNOSIS — E119 Type 2 diabetes mellitus without complications: Secondary | ICD-10-CM | POA: Diagnosis not present

## 2019-05-15 DIAGNOSIS — D631 Anemia in chronic kidney disease: Secondary | ICD-10-CM | POA: Diagnosis not present

## 2019-05-15 DIAGNOSIS — T82858A Stenosis of vascular prosthetic devices, implants and grafts, initial encounter: Secondary | ICD-10-CM | POA: Diagnosis not present

## 2019-05-15 DIAGNOSIS — D509 Iron deficiency anemia, unspecified: Secondary | ICD-10-CM | POA: Diagnosis not present

## 2019-05-15 DIAGNOSIS — I871 Compression of vein: Secondary | ICD-10-CM | POA: Diagnosis not present

## 2019-05-15 DIAGNOSIS — R52 Pain, unspecified: Secondary | ICD-10-CM | POA: Diagnosis not present

## 2019-05-15 DIAGNOSIS — N2581 Secondary hyperparathyroidism of renal origin: Secondary | ICD-10-CM | POA: Diagnosis not present

## 2019-05-15 DIAGNOSIS — N186 End stage renal disease: Secondary | ICD-10-CM | POA: Diagnosis not present

## 2019-05-15 DIAGNOSIS — Z992 Dependence on renal dialysis: Secondary | ICD-10-CM | POA: Diagnosis not present

## 2019-05-15 DIAGNOSIS — D688 Other specified coagulation defects: Secondary | ICD-10-CM | POA: Diagnosis not present

## 2019-05-23 ENCOUNTER — Other Ambulatory Visit: Payer: Self-pay

## 2019-05-23 ENCOUNTER — Ambulatory Visit (INDEPENDENT_AMBULATORY_CARE_PROVIDER_SITE_OTHER): Payer: Medicare HMO | Admitting: Student

## 2019-05-23 VITALS — BP 121/62 | HR 73 | Ht 66.0 in | Wt 136.0 lb

## 2019-05-23 DIAGNOSIS — I4729 Other ventricular tachycardia: Secondary | ICD-10-CM

## 2019-05-23 DIAGNOSIS — I472 Ventricular tachycardia: Secondary | ICD-10-CM | POA: Diagnosis not present

## 2019-05-23 DIAGNOSIS — N186 End stage renal disease: Secondary | ICD-10-CM

## 2019-05-23 DIAGNOSIS — Z950 Presence of cardiac pacemaker: Secondary | ICD-10-CM | POA: Diagnosis not present

## 2019-05-23 DIAGNOSIS — R001 Bradycardia, unspecified: Secondary | ICD-10-CM | POA: Diagnosis not present

## 2019-05-23 LAB — CUP PACEART INCLINIC DEVICE CHECK
Battery Impedance: 544 Ohm
Battery Remaining Longevity: 80 mo
Battery Voltage: 2.78 V
Brady Statistic AP VP Percent: 55 %
Brady Statistic AP VS Percent: 0 %
Brady Statistic AS VP Percent: 45 %
Brady Statistic AS VS Percent: 0 %
Date Time Interrogation Session: 20200811134304
Implantable Lead Implant Date: 20060418
Implantable Lead Implant Date: 20060418
Implantable Lead Location: 753859
Implantable Lead Location: 753860
Implantable Lead Model: 5076
Implantable Lead Model: 5076
Implantable Pulse Generator Implant Date: 20060418
Lead Channel Impedance Value: 408 Ohm
Lead Channel Impedance Value: 603 Ohm
Lead Channel Pacing Threshold Amplitude: 0.5 V
Lead Channel Pacing Threshold Amplitude: 0.5 V
Lead Channel Pacing Threshold Amplitude: 0.625 V
Lead Channel Pacing Threshold Amplitude: 0.75 V
Lead Channel Pacing Threshold Pulse Width: 0.4 ms
Lead Channel Pacing Threshold Pulse Width: 0.4 ms
Lead Channel Pacing Threshold Pulse Width: 0.4 ms
Lead Channel Pacing Threshold Pulse Width: 0.4 ms
Lead Channel Setting Pacing Amplitude: 1.5 V
Lead Channel Setting Pacing Amplitude: 2 V
Lead Channel Setting Pacing Pulse Width: 0.4 ms
Lead Channel Setting Sensing Sensitivity: 4 mV

## 2019-05-23 NOTE — Patient Instructions (Signed)
We will continue remote visits every 3 months.   We will schedule you to see Dr. Caryl Comes in 12 months. Please call our office if you have any questions or symptoms prior to that.    We will send you a letter when it is time to make your appointment.
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
## 2019-06-12 DIAGNOSIS — I129 Hypertensive chronic kidney disease with stage 1 through stage 4 chronic kidney disease, or unspecified chronic kidney disease: Secondary | ICD-10-CM | POA: Diagnosis not present

## 2019-06-12 DIAGNOSIS — Z992 Dependence on renal dialysis: Secondary | ICD-10-CM | POA: Diagnosis not present

## 2019-06-12 DIAGNOSIS — N186 End stage renal disease: Secondary | ICD-10-CM | POA: Diagnosis not present

## 2019-06-14 DIAGNOSIS — E119 Type 2 diabetes mellitus without complications: Secondary | ICD-10-CM | POA: Diagnosis not present

## 2019-06-14 DIAGNOSIS — D688 Other specified coagulation defects: Secondary | ICD-10-CM | POA: Diagnosis not present

## 2019-06-14 DIAGNOSIS — N2581 Secondary hyperparathyroidism of renal origin: Secondary | ICD-10-CM | POA: Diagnosis not present

## 2019-06-14 DIAGNOSIS — D631 Anemia in chronic kidney disease: Secondary | ICD-10-CM | POA: Diagnosis not present

## 2019-06-14 DIAGNOSIS — Z23 Encounter for immunization: Secondary | ICD-10-CM | POA: Diagnosis not present

## 2019-06-14 DIAGNOSIS — Z992 Dependence on renal dialysis: Secondary | ICD-10-CM | POA: Diagnosis not present

## 2019-06-14 DIAGNOSIS — N186 End stage renal disease: Secondary | ICD-10-CM | POA: Diagnosis not present

## 2019-06-14 DIAGNOSIS — D509 Iron deficiency anemia, unspecified: Secondary | ICD-10-CM | POA: Diagnosis not present

## 2019-06-14 DIAGNOSIS — E1129 Type 2 diabetes mellitus with other diabetic kidney complication: Secondary | ICD-10-CM | POA: Diagnosis not present

## 2019-06-22 ENCOUNTER — Ambulatory Visit: Payer: Medicare HMO | Admitting: Cardiovascular Disease

## 2019-07-03 ENCOUNTER — Encounter: Payer: Medicare HMO | Admitting: *Deleted

## 2019-07-11 ENCOUNTER — Encounter: Payer: Self-pay | Admitting: Cardiology

## 2019-07-12 DIAGNOSIS — N186 End stage renal disease: Secondary | ICD-10-CM | POA: Diagnosis not present

## 2019-07-12 DIAGNOSIS — Z992 Dependence on renal dialysis: Secondary | ICD-10-CM | POA: Diagnosis not present

## 2019-07-12 DIAGNOSIS — I129 Hypertensive chronic kidney disease with stage 1 through stage 4 chronic kidney disease, or unspecified chronic kidney disease: Secondary | ICD-10-CM | POA: Diagnosis not present

## 2019-07-14 DIAGNOSIS — N2581 Secondary hyperparathyroidism of renal origin: Secondary | ICD-10-CM | POA: Diagnosis not present

## 2019-07-14 DIAGNOSIS — D688 Other specified coagulation defects: Secondary | ICD-10-CM | POA: Diagnosis not present

## 2019-07-14 DIAGNOSIS — D631 Anemia in chronic kidney disease: Secondary | ICD-10-CM | POA: Diagnosis not present

## 2019-07-14 DIAGNOSIS — E119 Type 2 diabetes mellitus without complications: Secondary | ICD-10-CM | POA: Diagnosis not present

## 2019-07-14 DIAGNOSIS — D509 Iron deficiency anemia, unspecified: Secondary | ICD-10-CM | POA: Diagnosis not present

## 2019-07-14 DIAGNOSIS — Z992 Dependence on renal dialysis: Secondary | ICD-10-CM | POA: Diagnosis not present

## 2019-07-14 DIAGNOSIS — N186 End stage renal disease: Secondary | ICD-10-CM | POA: Diagnosis not present

## 2019-07-24 DIAGNOSIS — N186 End stage renal disease: Secondary | ICD-10-CM | POA: Diagnosis not present

## 2019-07-24 DIAGNOSIS — T82858A Stenosis of vascular prosthetic devices, implants and grafts, initial encounter: Secondary | ICD-10-CM | POA: Diagnosis not present

## 2019-07-24 DIAGNOSIS — I871 Compression of vein: Secondary | ICD-10-CM | POA: Diagnosis not present

## 2019-07-24 DIAGNOSIS — Z992 Dependence on renal dialysis: Secondary | ICD-10-CM | POA: Diagnosis not present

## 2019-07-31 DIAGNOSIS — T7840XA Allergy, unspecified, initial encounter: Secondary | ICD-10-CM | POA: Insufficient documentation

## 2019-08-12 DIAGNOSIS — N186 End stage renal disease: Secondary | ICD-10-CM | POA: Diagnosis not present

## 2019-08-12 DIAGNOSIS — Z992 Dependence on renal dialysis: Secondary | ICD-10-CM | POA: Diagnosis not present

## 2019-08-12 DIAGNOSIS — I129 Hypertensive chronic kidney disease with stage 1 through stage 4 chronic kidney disease, or unspecified chronic kidney disease: Secondary | ICD-10-CM | POA: Diagnosis not present

## 2019-08-14 DIAGNOSIS — N2581 Secondary hyperparathyroidism of renal origin: Secondary | ICD-10-CM | POA: Diagnosis not present

## 2019-08-14 DIAGNOSIS — R52 Pain, unspecified: Secondary | ICD-10-CM | POA: Diagnosis not present

## 2019-08-14 DIAGNOSIS — D688 Other specified coagulation defects: Secondary | ICD-10-CM | POA: Diagnosis not present

## 2019-08-14 DIAGNOSIS — E119 Type 2 diabetes mellitus without complications: Secondary | ICD-10-CM | POA: Diagnosis not present

## 2019-08-14 DIAGNOSIS — D509 Iron deficiency anemia, unspecified: Secondary | ICD-10-CM | POA: Diagnosis not present

## 2019-08-14 DIAGNOSIS — N186 End stage renal disease: Secondary | ICD-10-CM | POA: Diagnosis not present

## 2019-08-14 DIAGNOSIS — Z992 Dependence on renal dialysis: Secondary | ICD-10-CM | POA: Diagnosis not present

## 2019-08-14 DIAGNOSIS — D631 Anemia in chronic kidney disease: Secondary | ICD-10-CM | POA: Diagnosis not present

## 2019-08-18 ENCOUNTER — Ambulatory Visit (INDEPENDENT_AMBULATORY_CARE_PROVIDER_SITE_OTHER): Payer: Medicare HMO | Admitting: *Deleted

## 2019-08-18 DIAGNOSIS — I4729 Other ventricular tachycardia: Secondary | ICD-10-CM

## 2019-08-18 DIAGNOSIS — I472 Ventricular tachycardia: Secondary | ICD-10-CM

## 2019-08-18 DIAGNOSIS — I5033 Acute on chronic diastolic (congestive) heart failure: Secondary | ICD-10-CM | POA: Diagnosis not present

## 2019-08-19 LAB — CUP PACEART REMOTE DEVICE CHECK
Battery Impedance: 643 Ohm
Battery Remaining Longevity: 78 mo
Battery Voltage: 2.77 V
Brady Statistic AP VP Percent: 10 %
Brady Statistic AP VS Percent: 0 %
Brady Statistic AS VP Percent: 90 %
Brady Statistic AS VS Percent: 0 %
Date Time Interrogation Session: 20201106215057
Implantable Lead Implant Date: 20060418
Implantable Lead Implant Date: 20060418
Implantable Lead Location: 753859
Implantable Lead Location: 753860
Implantable Lead Model: 5076
Implantable Lead Model: 5076
Implantable Pulse Generator Implant Date: 20060418
Lead Channel Impedance Value: 449 Ohm
Lead Channel Impedance Value: 645 Ohm
Lead Channel Pacing Threshold Amplitude: 0.5 V
Lead Channel Pacing Threshold Amplitude: 0.625 V
Lead Channel Pacing Threshold Pulse Width: 0.4 ms
Lead Channel Pacing Threshold Pulse Width: 0.4 ms
Lead Channel Setting Pacing Amplitude: 1.5 V
Lead Channel Setting Pacing Amplitude: 2 V
Lead Channel Setting Pacing Pulse Width: 0.4 ms
Lead Channel Setting Sensing Sensitivity: 4 mV

## 2019-08-24 ENCOUNTER — Telehealth: Payer: Self-pay

## 2019-08-24 NOTE — Telephone Encounter (Signed)
-----   Message from William [name] sent at 08/24/2019 12:42 PM EST ----- Regarding: Remote transmission Hello,  The patient called in regards to a letter he received about his missed transmission. He thought he sent another one , but just wants to confirm.  Please call to discuss recent transmissions with him   Tyler Pita

## 2019-08-24 NOTE — Telephone Encounter (Signed)
I spoke with the pt daughter Rise Paganini to let her know we did receive the transmission in September. She told me the try to do the transmissions once a month. I told her that was fine. I told her his next transmission is on 11-17-2019. I told her to make sure she sends it on that day of the month if she can. The pt daughter verbalized understanding.

## 2019-09-03 NOTE — Progress Notes (Addendum)
Remote pacemaker transmission.   

## 2019-09-11 DIAGNOSIS — Z992 Dependence on renal dialysis: Secondary | ICD-10-CM | POA: Diagnosis not present

## 2019-09-11 DIAGNOSIS — N186 End stage renal disease: Secondary | ICD-10-CM | POA: Diagnosis not present

## 2019-09-11 DIAGNOSIS — I129 Hypertensive chronic kidney disease with stage 1 through stage 4 chronic kidney disease, or unspecified chronic kidney disease: Secondary | ICD-10-CM | POA: Diagnosis not present

## 2019-09-13 DIAGNOSIS — E119 Type 2 diabetes mellitus without complications: Secondary | ICD-10-CM | POA: Diagnosis not present

## 2019-09-13 DIAGNOSIS — N186 End stage renal disease: Secondary | ICD-10-CM | POA: Diagnosis not present

## 2019-09-13 DIAGNOSIS — N2581 Secondary hyperparathyroidism of renal origin: Secondary | ICD-10-CM | POA: Diagnosis not present

## 2019-09-13 DIAGNOSIS — D509 Iron deficiency anemia, unspecified: Secondary | ICD-10-CM | POA: Diagnosis not present

## 2019-09-13 DIAGNOSIS — Z992 Dependence on renal dialysis: Secondary | ICD-10-CM | POA: Diagnosis not present

## 2019-09-13 DIAGNOSIS — D631 Anemia in chronic kidney disease: Secondary | ICD-10-CM | POA: Diagnosis not present

## 2019-09-13 DIAGNOSIS — D688 Other specified coagulation defects: Secondary | ICD-10-CM | POA: Diagnosis not present

## 2019-09-14 ENCOUNTER — Ambulatory Visit: Payer: Medicare HMO | Admitting: Cardiovascular Disease

## 2019-10-12 DIAGNOSIS — Z992 Dependence on renal dialysis: Secondary | ICD-10-CM | POA: Diagnosis not present

## 2019-10-12 DIAGNOSIS — I129 Hypertensive chronic kidney disease with stage 1 through stage 4 chronic kidney disease, or unspecified chronic kidney disease: Secondary | ICD-10-CM | POA: Diagnosis not present

## 2019-10-12 DIAGNOSIS — N186 End stage renal disease: Secondary | ICD-10-CM | POA: Diagnosis not present

## 2019-10-14 DIAGNOSIS — N186 End stage renal disease: Secondary | ICD-10-CM | POA: Diagnosis not present

## 2019-10-14 DIAGNOSIS — D631 Anemia in chronic kidney disease: Secondary | ICD-10-CM | POA: Diagnosis not present

## 2019-10-14 DIAGNOSIS — N2581 Secondary hyperparathyroidism of renal origin: Secondary | ICD-10-CM | POA: Diagnosis not present

## 2019-10-14 DIAGNOSIS — E119 Type 2 diabetes mellitus without complications: Secondary | ICD-10-CM | POA: Diagnosis not present

## 2019-10-14 DIAGNOSIS — D688 Other specified coagulation defects: Secondary | ICD-10-CM | POA: Diagnosis not present

## 2019-10-14 DIAGNOSIS — Z992 Dependence on renal dialysis: Secondary | ICD-10-CM | POA: Diagnosis not present

## 2019-10-16 DIAGNOSIS — I871 Compression of vein: Secondary | ICD-10-CM | POA: Diagnosis not present

## 2019-10-16 DIAGNOSIS — N186 End stage renal disease: Secondary | ICD-10-CM | POA: Diagnosis not present

## 2019-10-16 DIAGNOSIS — T82868A Thrombosis of vascular prosthetic devices, implants and grafts, initial encounter: Secondary | ICD-10-CM | POA: Diagnosis not present

## 2019-10-16 DIAGNOSIS — Z992 Dependence on renal dialysis: Secondary | ICD-10-CM | POA: Diagnosis not present

## 2019-11-09 ENCOUNTER — Ambulatory Visit (INDEPENDENT_AMBULATORY_CARE_PROVIDER_SITE_OTHER): Payer: Medicare HMO | Admitting: Cardiovascular Disease

## 2019-11-09 ENCOUNTER — Other Ambulatory Visit: Payer: Self-pay

## 2019-11-09 ENCOUNTER — Encounter: Payer: Self-pay | Admitting: Cardiovascular Disease

## 2019-11-09 VITALS — BP 132/62 | HR 61 | Ht 67.0 in | Wt 129.0 lb

## 2019-11-09 DIAGNOSIS — I472 Ventricular tachycardia: Secondary | ICD-10-CM | POA: Diagnosis not present

## 2019-11-09 DIAGNOSIS — I5033 Acute on chronic diastolic (congestive) heart failure: Secondary | ICD-10-CM

## 2019-11-09 DIAGNOSIS — Z992 Dependence on renal dialysis: Secondary | ICD-10-CM | POA: Diagnosis not present

## 2019-11-09 DIAGNOSIS — I5032 Chronic diastolic (congestive) heart failure: Secondary | ICD-10-CM

## 2019-11-09 DIAGNOSIS — I10 Essential (primary) hypertension: Secondary | ICD-10-CM

## 2019-11-09 DIAGNOSIS — N186 End stage renal disease: Secondary | ICD-10-CM | POA: Diagnosis not present

## 2019-11-09 DIAGNOSIS — I4729 Other ventricular tachycardia: Secondary | ICD-10-CM

## 2019-11-09 MED ORDER — AMIODARONE HCL 200 MG PO TABS: 200.0000 mg | ORAL_TABLET | ORAL | 3 refills | Status: DC
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
## 2019-11-09 NOTE — Patient Instructions (Signed)
Medication Instructions:  Your physician has recommended you make the following change in your medication:  DECREASE Amiodarone (Pacerone) to 200 mg to 3 days per week (Mon, Wed, Fri)  *If you need a refill on your cardiac medications before your next appointment, please call your pharmacy*  Lab Work: None Ordered If you have labs (blood work) drawn today and your tests are completely normal, you will receive your results only by: [name] MyChart Message (if you have MyChart) OR . A paper copy in the mail If you have any lab test that is abnormal or we need to change your treatment, we will call you to review the results.   Testing/Procedures: None Ordered   Follow-Up: At [facility], you and your health needs are our priority.  As part of our continuing mission to provide you with exceptional heart care, we have created designated Provider Care Teams.  These Care Teams include your primary Cardiologist (physician) and Advanced Practice Providers (APPs -  Physician Assistants and Nurse Practitioners) who all work together to provide you with the care you need, when you need it.  Your next appointment:   6 month(s)  The format for your next appointment:   Either In Person or Virtual  Provider:   [name], NP

## 2019-11-12 DIAGNOSIS — I129 Hypertensive chronic kidney disease with stage 1 through stage 4 chronic kidney disease, or unspecified chronic kidney disease: Secondary | ICD-10-CM | POA: Diagnosis not present

## 2019-11-12 DIAGNOSIS — Z992 Dependence on renal dialysis: Secondary | ICD-10-CM | POA: Diagnosis not present

## 2019-11-12 DIAGNOSIS — N186 End stage renal disease: Secondary | ICD-10-CM | POA: Diagnosis not present

## 2019-11-13 DIAGNOSIS — D688 Other specified coagulation defects: Secondary | ICD-10-CM | POA: Diagnosis not present

## 2019-11-13 DIAGNOSIS — D631 Anemia in chronic kidney disease: Secondary | ICD-10-CM | POA: Diagnosis not present

## 2019-11-13 DIAGNOSIS — Z992 Dependence on renal dialysis: Secondary | ICD-10-CM | POA: Diagnosis not present

## 2019-11-13 DIAGNOSIS — T8249XA Other complication of vascular dialysis catheter, initial encounter: Secondary | ICD-10-CM | POA: Diagnosis not present

## 2019-11-13 DIAGNOSIS — R52 Pain, unspecified: Secondary | ICD-10-CM | POA: Diagnosis not present

## 2019-11-13 DIAGNOSIS — N2581 Secondary hyperparathyroidism of renal origin: Secondary | ICD-10-CM | POA: Diagnosis not present

## 2019-11-13 DIAGNOSIS — N186 End stage renal disease: Secondary | ICD-10-CM | POA: Diagnosis not present

## 2019-11-13 DIAGNOSIS — E119 Type 2 diabetes mellitus without complications: Secondary | ICD-10-CM | POA: Diagnosis not present

## 2019-11-23 DIAGNOSIS — N186 End stage renal disease: Secondary | ICD-10-CM | POA: Diagnosis not present

## 2019-11-23 DIAGNOSIS — I871 Compression of vein: Secondary | ICD-10-CM | POA: Diagnosis not present

## 2019-11-23 DIAGNOSIS — T82868A Thrombosis of vascular prosthetic devices, implants and grafts, initial encounter: Secondary | ICD-10-CM | POA: Diagnosis not present

## 2019-11-23 DIAGNOSIS — Z992 Dependence on renal dialysis: Secondary | ICD-10-CM | POA: Diagnosis not present

## 2019-11-28 DIAGNOSIS — Z992 Dependence on renal dialysis: Secondary | ICD-10-CM | POA: Diagnosis not present

## 2019-11-28 DIAGNOSIS — T82868A Thrombosis of vascular prosthetic devices, implants and grafts, initial encounter: Secondary | ICD-10-CM | POA: Diagnosis not present

## 2019-11-28 DIAGNOSIS — T829XXA Unspecified complication of cardiac and vascular prosthetic device, implant and graft, initial encounter: Secondary | ICD-10-CM | POA: Insufficient documentation

## 2019-11-28 DIAGNOSIS — N186 End stage renal disease: Secondary | ICD-10-CM | POA: Diagnosis not present

## 2019-12-08 DIAGNOSIS — N186 End stage renal disease: Secondary | ICD-10-CM | POA: Diagnosis not present

## 2019-12-08 DIAGNOSIS — D688 Other specified coagulation defects: Secondary | ICD-10-CM | POA: Diagnosis not present

## 2019-12-08 DIAGNOSIS — D631 Anemia in chronic kidney disease: Secondary | ICD-10-CM | POA: Diagnosis not present

## 2019-12-08 DIAGNOSIS — R52 Pain, unspecified: Secondary | ICD-10-CM | POA: Diagnosis not present

## 2019-12-08 DIAGNOSIS — Z992 Dependence on renal dialysis: Secondary | ICD-10-CM | POA: Diagnosis not present

## 2019-12-08 DIAGNOSIS — N2581 Secondary hyperparathyroidism of renal origin: Secondary | ICD-10-CM | POA: Diagnosis not present

## 2019-12-08 DIAGNOSIS — T8249XA Other complication of vascular dialysis catheter, initial encounter: Secondary | ICD-10-CM | POA: Diagnosis not present

## 2019-12-08 DIAGNOSIS — E119 Type 2 diabetes mellitus without complications: Secondary | ICD-10-CM | POA: Diagnosis not present

## 2019-12-10 DIAGNOSIS — N186 End stage renal disease: Secondary | ICD-10-CM | POA: Diagnosis not present

## 2019-12-10 DIAGNOSIS — Z992 Dependence on renal dialysis: Secondary | ICD-10-CM | POA: Diagnosis not present

## 2019-12-10 DIAGNOSIS — I129 Hypertensive chronic kidney disease with stage 1 through stage 4 chronic kidney disease, or unspecified chronic kidney disease: Secondary | ICD-10-CM | POA: Diagnosis not present

## 2019-12-11 DIAGNOSIS — D631 Anemia in chronic kidney disease: Secondary | ICD-10-CM | POA: Diagnosis not present

## 2019-12-11 DIAGNOSIS — N2581 Secondary hyperparathyroidism of renal origin: Secondary | ICD-10-CM | POA: Diagnosis not present

## 2019-12-11 DIAGNOSIS — E1129 Type 2 diabetes mellitus with other diabetic kidney complication: Secondary | ICD-10-CM | POA: Diagnosis not present

## 2019-12-11 DIAGNOSIS — T8249XA Other complication of vascular dialysis catheter, initial encounter: Secondary | ICD-10-CM | POA: Diagnosis not present

## 2019-12-11 DIAGNOSIS — E119 Type 2 diabetes mellitus without complications: Secondary | ICD-10-CM | POA: Diagnosis not present

## 2019-12-11 DIAGNOSIS — Z992 Dependence on renal dialysis: Secondary | ICD-10-CM | POA: Diagnosis not present

## 2019-12-11 DIAGNOSIS — D688 Other specified coagulation defects: Secondary | ICD-10-CM | POA: Diagnosis not present

## 2019-12-11 DIAGNOSIS — N186 End stage renal disease: Secondary | ICD-10-CM | POA: Diagnosis not present

## 2019-12-19 ENCOUNTER — Ambulatory Visit (INDEPENDENT_AMBULATORY_CARE_PROVIDER_SITE_OTHER): Payer: Medicare HMO | Admitting: *Deleted

## 2019-12-19 DIAGNOSIS — I5033 Acute on chronic diastolic (congestive) heart failure: Secondary | ICD-10-CM

## 2019-12-19 LAB — CUP PACEART REMOTE DEVICE CHECK
Battery Impedance: 798 Ohm
Battery Remaining Longevity: 68 mo
Battery Voltage: 2.78 V
Brady Statistic AP VP Percent: 14 %
Brady Statistic AP VS Percent: 0 %
Brady Statistic AS VP Percent: 86 %
Brady Statistic AS VS Percent: 0 %
Date Time Interrogation Session: 20210309102612
Implantable Lead Implant Date: 20060418
Implantable Lead Implant Date: 20060418
Implantable Lead Location: 753859
Implantable Lead Location: 753860
Implantable Lead Model: 5076
Implantable Lead Model: 5076
Implantable Pulse Generator Implant Date: 20060418
Lead Channel Impedance Value: 414 Ohm
Lead Channel Impedance Value: 578 Ohm
Lead Channel Pacing Threshold Amplitude: 0.5 V
Lead Channel Pacing Threshold Amplitude: 0.5 V
Lead Channel Pacing Threshold Pulse Width: 0.4 ms
Lead Channel Pacing Threshold Pulse Width: 0.4 ms
Lead Channel Setting Pacing Amplitude: 1.5 V
Lead Channel Setting Pacing Amplitude: 2 V
Lead Channel Setting Pacing Pulse Width: 0.4 ms
Lead Channel Setting Sensing Sensitivity: 4 mV

## 2019-12-20 NOTE — Progress Notes (Signed)
PPM Remote  

## 2019-12-27 DIAGNOSIS — T8249XA Other complication of vascular dialysis catheter, initial encounter: Secondary | ICD-10-CM | POA: Diagnosis not present

## 2019-12-27 DIAGNOSIS — Z992 Dependence on renal dialysis: Secondary | ICD-10-CM | POA: Diagnosis not present

## 2019-12-27 DIAGNOSIS — N2581 Secondary hyperparathyroidism of renal origin: Secondary | ICD-10-CM | POA: Diagnosis not present

## 2019-12-27 DIAGNOSIS — E119 Type 2 diabetes mellitus without complications: Secondary | ICD-10-CM | POA: Diagnosis not present

## 2019-12-27 DIAGNOSIS — D688 Other specified coagulation defects: Secondary | ICD-10-CM | POA: Diagnosis not present

## 2019-12-27 DIAGNOSIS — N186 End stage renal disease: Secondary | ICD-10-CM | POA: Diagnosis not present

## 2019-12-27 DIAGNOSIS — E1129 Type 2 diabetes mellitus with other diabetic kidney complication: Secondary | ICD-10-CM | POA: Diagnosis not present

## 2019-12-27 DIAGNOSIS — D631 Anemia in chronic kidney disease: Secondary | ICD-10-CM | POA: Diagnosis not present

## 2020-01-10 DIAGNOSIS — I129 Hypertensive chronic kidney disease with stage 1 through stage 4 chronic kidney disease, or unspecified chronic kidney disease: Secondary | ICD-10-CM | POA: Diagnosis not present

## 2020-01-10 DIAGNOSIS — Z992 Dependence on renal dialysis: Secondary | ICD-10-CM | POA: Diagnosis not present

## 2020-01-10 DIAGNOSIS — N186 End stage renal disease: Secondary | ICD-10-CM | POA: Diagnosis not present

## 2020-01-12 DIAGNOSIS — T8249XA Other complication of vascular dialysis catheter, initial encounter: Secondary | ICD-10-CM | POA: Diagnosis not present

## 2020-01-12 DIAGNOSIS — E1129 Type 2 diabetes mellitus with other diabetic kidney complication: Secondary | ICD-10-CM | POA: Diagnosis not present

## 2020-01-12 DIAGNOSIS — E119 Type 2 diabetes mellitus without complications: Secondary | ICD-10-CM | POA: Diagnosis not present

## 2020-01-12 DIAGNOSIS — N186 End stage renal disease: Secondary | ICD-10-CM | POA: Diagnosis not present

## 2020-01-12 DIAGNOSIS — D688 Other specified coagulation defects: Secondary | ICD-10-CM | POA: Diagnosis not present

## 2020-01-12 DIAGNOSIS — N2581 Secondary hyperparathyroidism of renal origin: Secondary | ICD-10-CM | POA: Diagnosis not present

## 2020-01-12 DIAGNOSIS — Z992 Dependence on renal dialysis: Secondary | ICD-10-CM | POA: Diagnosis not present

## 2020-01-29 DIAGNOSIS — N2581 Secondary hyperparathyroidism of renal origin: Secondary | ICD-10-CM | POA: Diagnosis not present

## 2020-01-29 DIAGNOSIS — D688 Other specified coagulation defects: Secondary | ICD-10-CM | POA: Diagnosis not present

## 2020-01-29 DIAGNOSIS — E1129 Type 2 diabetes mellitus with other diabetic kidney complication: Secondary | ICD-10-CM | POA: Diagnosis not present

## 2020-01-29 DIAGNOSIS — N186 End stage renal disease: Secondary | ICD-10-CM | POA: Diagnosis not present

## 2020-01-29 DIAGNOSIS — E119 Type 2 diabetes mellitus without complications: Secondary | ICD-10-CM | POA: Diagnosis not present

## 2020-01-29 DIAGNOSIS — Z992 Dependence on renal dialysis: Secondary | ICD-10-CM | POA: Diagnosis not present

## 2020-01-29 DIAGNOSIS — T8249XA Other complication of vascular dialysis catheter, initial encounter: Secondary | ICD-10-CM | POA: Diagnosis not present

## 2020-01-31 ENCOUNTER — Other Ambulatory Visit: Payer: Self-pay | Admitting: *Deleted

## 2020-01-31 DIAGNOSIS — N186 End stage renal disease: Secondary | ICD-10-CM

## 2020-02-02 ENCOUNTER — Other Ambulatory Visit: Payer: Self-pay | Admitting: Nurse Practitioner

## 2020-02-05 ENCOUNTER — Telehealth (HOSPITAL_COMMUNITY): Payer: Self-pay

## 2020-02-05 NOTE — Telephone Encounter (Signed)

## 2020-02-06 ENCOUNTER — Ambulatory Visit: Payer: Medicare HMO | Admitting: Vascular Surgery

## 2020-02-06 ENCOUNTER — Ambulatory Visit (INDEPENDENT_AMBULATORY_CARE_PROVIDER_SITE_OTHER)
Admission: RE | Admit: 2020-02-06 | Discharge: 2020-02-06 | Disposition: A | Discharge status: Home / Self Care | Payer: Medicare HMO | Source: Ambulatory Visit | Attending: Surgery | Admitting: Surgery

## 2020-02-06 ENCOUNTER — Ambulatory Visit (INDEPENDENT_AMBULATORY_CARE_PROVIDER_SITE_OTHER): Payer: Medicare HMO | Admitting: Sports Medicine

## 2020-02-06 ENCOUNTER — Ambulatory Visit (HOSPITAL_COMMUNITY)
Admission: RE | Admit: 2020-02-06 | Discharge: 2020-02-06 | Disposition: A | Discharge status: Home / Self Care | Payer: Medicare HMO | Source: Ambulatory Visit | Attending: Surgery | Admitting: Surgery

## 2020-02-06 ENCOUNTER — Other Ambulatory Visit: Payer: Self-pay

## 2020-02-06 ENCOUNTER — Encounter: Payer: Self-pay | Admitting: Sports Medicine

## 2020-02-06 DIAGNOSIS — E119 Type 2 diabetes mellitus without complications: Secondary | ICD-10-CM | POA: Diagnosis not present

## 2020-02-06 DIAGNOSIS — B351 Tinea unguium: Secondary | ICD-10-CM

## 2020-02-06 DIAGNOSIS — L84 Corns and callosities: Secondary | ICD-10-CM

## 2020-02-06 DIAGNOSIS — N186 End stage renal disease: Secondary | ICD-10-CM | POA: Insufficient documentation

## 2020-02-06 DIAGNOSIS — M79675 Pain in left toe(s): Secondary | ICD-10-CM | POA: Diagnosis not present

## 2020-02-06 DIAGNOSIS — M79672 Pain in left foot: Secondary | ICD-10-CM

## 2020-02-06 DIAGNOSIS — E559 Vitamin D deficiency, unspecified: Secondary | ICD-10-CM | POA: Insufficient documentation

## 2020-02-06 DIAGNOSIS — M79674 Pain in right toe(s): Secondary | ICD-10-CM

## 2020-02-06 DIAGNOSIS — I739 Peripheral vascular disease, unspecified: Secondary | ICD-10-CM | POA: Insufficient documentation

## 2020-02-06 HISTORY — DX: Peripheral vascular disease, unspecified: I73.9

## 2020-02-06 NOTE — Progress Notes (Signed)
Subjective: William [name] is a 78 y.o. male patient with history of diabetes who presents to office today complaining of callus on left foot and long,mildly painful nails  while ambulating in shoes; unable to trim. Patient states that the glucose reading this morning was not recorded; reports that they keep track at dialysis M/W/F. Patient denies any new changes in medication or new problems. Reports uses a cane to help him walk sometimes, has been declining in health since being on dialysis.  Review of Systems  All other systems reviewed and are negative.    Patient Active Problem List   Diagnosis Date Noted  . Hypovitaminosis D 02/06/2020  . PAD (peripheral artery disease) ([place]) 02/06/2020  . Complication of vascular dialysis catheter 11/28/2019  . Allergy, unspecified, initial encounter 07/31/2019  . 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  . Hospital discharge follow-up 04/11/2019  . 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 Encounter for removal of sutures 12/13/2018  . Iron deficiency anemia, unspecified 11/30/2018  . Uremia syndrome   . ESRD (end stage renal disease) ([place]) 11/19/2018  . Malnutrition of moderate degree 11/19/2018  . 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 Chronic systolic (congestive) heart failure ([place]) 11/18/2018  . Fever, unspecified 11/18/2018  . Other specified coagulation defects ([place]) 11/18/2018  . Pain, unspecified 11/18/2018  . Sarcoidosis, unspecified 11/18/2018  . Secondary hyperparathyroidism of renal origin ([place]) 11/18/2018  . Shortness of breath 11/18/2018  . Type 2 diabetes mellitus with diabetic peripheral angiopathy without gangrene ([place]) 11/18/2018  . Acute on chronic diastolic congestive heart failure ([place])   . Abdominal pain   . Renal cyst 10/17/2018  . Ventricular tachycardia, non-sustained ([place])   . Decreased activities of daily living (ADL)   . Palliative care by specialist   . 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 Dehydration   . Gait instability 01/06/2018  . Type 2 diabetes mellitus without complication, without long-term current use of insulin ([place]) 10/30/2017  . Benign essential HTN 10/30/2017  . Hyperlipidemia 10/30/2017  . Ethmoid sinusitis 07/11/2015  . Subdural hematoma ([place]) 07/08/2015  . CAD (coronary artery disease) 10/26/2014  . 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   Current Outpatient Medications on File Prior to Visit  Medication Sig Dispense Refill  . dextrose 50 % solution Dextrose 50%    . Methoxy PEG-Epoetin Beta (MIRCERA IJ) Mircera    . amiodarone (PACERONE) 200 MG tablet Take 1 tablet (200 mg total) by mouth 3 (three) times a week. Take on Mon, Wed, Fri 45 tablet 3  . Ascorbic Acid (VITAMIN C) 1000 MG tablet Take 1 tablet by mouth daily.    [name] aspirin 81 MG chewable tablet Chew 1 tablet (81 mg total) by mouth daily. 30 tablet 1  . B Complex-C-Folic Acid (RENAL-VITE) 0.8 MG TABS Take 1 tablet by mouth at bedtime.    . lidocaine-prilocaine (EMLA) cream Apply 1 application topically See admin instructions. APPLY SMALL AMOUNT TO ACCESS SITE (AVF) 3 TIMES A WEEK 1 HOUR BEFORE DIALYSIS. COVER WITH OCCLUSIVE DRESSING (SARAN WRAP)    . pantoprazole (PROTONIX) 20 MG tablet Take 1 tablet by mouth daily 90 tablet 3  . polyethylene glycol (MIRALAX / GLYCOLAX) packet Take 17 g by mouth daily. 14 each 0  . rosuvastatin (CRESTOR) 10 MG tablet Take 1 tablet (10 mg total) by mouth daily at 6 PM. 30 tablet 0   No current facility-administered medications on file prior to visit.   Allergies  Allergen Reactions  . Carvedilol Other (See Comments)    Makes him feel like his pelvis is "grinding" (??) Patient doesn't recall this, however    Recent Results (from the past 2160 hour(s))  CUP PACEART REMOTE DEVICE CHECK     Status: None   Collection Time: 12/19/19 10:26 AM  Result Value Ref Range   Date Time Interrogation Session [id number]    Pulse Generator  Manufacturer MERM    Pulse Gen Model ADDR01 Adapta    Pulse Gen Serial Number [id number] [facility] Name [place] [employer] Pulse Generator Type Implantable Pulse Generator    Implantable Pulse Generator Implant Date [id number]    Implantable Lead Manufacturer MERM    Implantable Lead Model 5076 CapSureFix Novus    Implantable Lead Serial Number [id number]    Implantable Lead Implant Date [id number]    Implantable Lead Location Detail 1 UNKNOWN    Implantable Lead Location [id number]    Implantable Lead Manufacturer MERM    Implantable Lead Model 5076 CapSureFix Novus    Implantable Lead Serial Number [id number]    Implantable Lead Implant Date [id number]    Implantable Lead Location Detail 1 UNKNOWN    Implantable Lead Location [id number]    Lead Channel Setting Sensing Sensitivity 4.00 mV   Lead Channel Setting Pacing Amplitude 1.500 V   Lead Channel Setting Pacing Pulse Width 0.40 ms   Lead Channel Setting Pacing Amplitude 2.000 V   Lead Channel Impedance Value 414 ohm   Lead Channel Pacing Threshold Amplitude 0.500 V   Lead Channel Pacing Threshold Pulse Width 0.40 ms   Lead Channel Impedance Value 578 ohm   Lead Channel Pacing Threshold Amplitude 0.500 V   Lead Channel Pacing Threshold Pulse Width 0.40 ms   Battery Status OK    Battery Remaining Longevity 68 mo   Battery Voltage 2.78 V   Battery Impedance 798 ohm   Brady Statistic AP VP Percent 14 %   Brady Statistic AS VP Percent 86 %   Brady Statistic AP VS Percent 0 %   Brady Statistic AS VS Percent 0 %    Objective: General: Patient is awake, alert, and oriented x 3 and in no acute distress.  Integument: Skin is warm, dry and supple bilateral. Nails are tender, long, thickened and dystrophic with subungual debris, consistent with onychomycosis, 1-5 bilateral. + callus sub met 5 on left. No signs of infection. Remaining integument unremarkable.  Vasculature:  Dorsalis Pedis pulse 1/4 bilateral. Posterior Tibial pulse   1/4 bilateral, faintly. Capillary fill time <5 sec 1-5 bilateral. Scant hair growth to the level of the digits. Temperature gradient within normal limits. No varicosities present bilateral. No edema present bilateral.   Neurology: The patient has intact sensation measured with a 5.07/10g Semmes Weinstein Monofilament. No Babinski sign present bilateral.   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 Muscular strength 5/5 in all lower extremity muscular groups bilateral without pain on range of motion . No tenderness with calf compression bilateral.  Assessment and Plan: Problem List Items Addressed This Visit      Genitourinary   ESRD (end stage renal disease) ([place])    Other Visit Diagnoses    Pain due to onychomycosis of toenails of both feet    -  Primary   Callus of foot       Left foot pain       Diabetes mellitus without complication ([place])          -Examined patient. -  Discussed and educated patient on diabetic foot care, especially with regards to the vascular, neurological and musculoskeletal systems.  -Stressed the importance of good glycemic control and the detriment of not  controlling glucose levels in relation to the foot. -Mechanically debrided all nails 1-5 bilateral using sterile nail nipper and filed with dremel without incident  -Dispened tube foam to use as needed for left 1st toe -Answered all patient questions -Patient to return  in 3 months for at risk foot care -Patient advised to call the office if any problems or questions arise in the meantime.  Landis Martins, DPM

## 2020-02-09 DIAGNOSIS — I129 Hypertensive chronic kidney disease with stage 1 through stage 4 chronic kidney disease, or unspecified chronic kidney disease: Secondary | ICD-10-CM | POA: Diagnosis not present

## 2020-02-09 DIAGNOSIS — N186 End stage renal disease: Secondary | ICD-10-CM | POA: Diagnosis not present

## 2020-02-09 DIAGNOSIS — Z992 Dependence on renal dialysis: Secondary | ICD-10-CM | POA: Diagnosis not present

## 2020-02-12 DIAGNOSIS — E119 Type 2 diabetes mellitus without complications: Secondary | ICD-10-CM | POA: Diagnosis not present

## 2020-02-12 DIAGNOSIS — N186 End stage renal disease: Secondary | ICD-10-CM | POA: Diagnosis not present

## 2020-02-12 DIAGNOSIS — D631 Anemia in chronic kidney disease: Secondary | ICD-10-CM | POA: Diagnosis not present

## 2020-02-12 DIAGNOSIS — N2581 Secondary hyperparathyroidism of renal origin: Secondary | ICD-10-CM | POA: Diagnosis not present

## 2020-02-12 DIAGNOSIS — E1129 Type 2 diabetes mellitus with other diabetic kidney complication: Secondary | ICD-10-CM | POA: Diagnosis not present

## 2020-02-12 DIAGNOSIS — Z992 Dependence on renal dialysis: Secondary | ICD-10-CM | POA: Diagnosis not present

## 2020-02-12 DIAGNOSIS — D688 Other specified coagulation defects: Secondary | ICD-10-CM | POA: Diagnosis not present

## 2020-02-12 DIAGNOSIS — T8249XA Other complication of vascular dialysis catheter, initial encounter: Secondary | ICD-10-CM | POA: Diagnosis not present

## 2020-02-28 DIAGNOSIS — E119 Type 2 diabetes mellitus without complications: Secondary | ICD-10-CM | POA: Diagnosis not present

## 2020-02-28 DIAGNOSIS — E1129 Type 2 diabetes mellitus with other diabetic kidney complication: Secondary | ICD-10-CM | POA: Diagnosis not present

## 2020-02-28 DIAGNOSIS — N2581 Secondary hyperparathyroidism of renal origin: Secondary | ICD-10-CM | POA: Diagnosis not present

## 2020-02-28 DIAGNOSIS — T8249XA Other complication of vascular dialysis catheter, initial encounter: Secondary | ICD-10-CM | POA: Diagnosis not present

## 2020-02-28 DIAGNOSIS — D688 Other specified coagulation defects: Secondary | ICD-10-CM | POA: Diagnosis not present

## 2020-02-28 DIAGNOSIS — N186 End stage renal disease: Secondary | ICD-10-CM | POA: Diagnosis not present

## 2020-02-28 DIAGNOSIS — D631 Anemia in chronic kidney disease: Secondary | ICD-10-CM | POA: Diagnosis not present

## 2020-02-28 DIAGNOSIS — Z992 Dependence on renal dialysis: Secondary | ICD-10-CM | POA: Diagnosis not present

## 2020-03-05 ENCOUNTER — Ambulatory Visit (INDEPENDENT_AMBULATORY_CARE_PROVIDER_SITE_OTHER): Payer: Medicare HMO | Admitting: Vascular Surgery

## 2020-03-05 ENCOUNTER — Encounter: Payer: Self-pay | Admitting: Vascular Surgery

## 2020-03-05 ENCOUNTER — Other Ambulatory Visit: Payer: Self-pay

## 2020-03-05 VITALS — BP 108/69 | HR 76 | Temp 97.9°F | Resp 16 | Ht 66.0 in | Wt 130.0 lb

## 2020-03-05 DIAGNOSIS — N186 End stage renal disease: Secondary | ICD-10-CM | POA: Diagnosis not present
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
## 2020-03-11 DIAGNOSIS — Z992 Dependence on renal dialysis: Secondary | ICD-10-CM | POA: Diagnosis not present

## 2020-03-11 DIAGNOSIS — N186 End stage renal disease: Secondary | ICD-10-CM | POA: Diagnosis not present

## 2020-03-11 DIAGNOSIS — I129 Hypertensive chronic kidney disease with stage 1 through stage 4 chronic kidney disease, or unspecified chronic kidney disease: Secondary | ICD-10-CM | POA: Diagnosis not present

## 2020-03-12 ENCOUNTER — Other Ambulatory Visit (HOSPITAL_COMMUNITY): Payer: Medicare HMO

## 2020-03-13 DIAGNOSIS — E1129 Type 2 diabetes mellitus with other diabetic kidney complication: Secondary | ICD-10-CM | POA: Diagnosis not present

## 2020-03-13 DIAGNOSIS — E162 Hypoglycemia, unspecified: Secondary | ICD-10-CM | POA: Diagnosis not present

## 2020-03-13 DIAGNOSIS — E119 Type 2 diabetes mellitus without complications: Secondary | ICD-10-CM | POA: Diagnosis not present

## 2020-03-13 DIAGNOSIS — Z992 Dependence on renal dialysis: Secondary | ICD-10-CM | POA: Diagnosis not present

## 2020-03-13 DIAGNOSIS — D631 Anemia in chronic kidney disease: Secondary | ICD-10-CM | POA: Diagnosis not present

## 2020-03-13 DIAGNOSIS — N186 End stage renal disease: Secondary | ICD-10-CM | POA: Diagnosis not present

## 2020-03-13 DIAGNOSIS — T8249XA Other complication of vascular dialysis catheter, initial encounter: Secondary | ICD-10-CM | POA: Diagnosis not present

## 2020-03-13 DIAGNOSIS — N2581 Secondary hyperparathyroidism of renal origin: Secondary | ICD-10-CM | POA: Diagnosis not present

## 2020-03-13 DIAGNOSIS — D688 Other specified coagulation defects: Secondary | ICD-10-CM | POA: Diagnosis not present

## 2020-03-14 ENCOUNTER — Encounter (HOSPITAL_COMMUNITY): Admission: RE | Payer: Self-pay | Source: Home / Self Care

## 2020-03-14 ENCOUNTER — Ambulatory Visit (HOSPITAL_COMMUNITY): Admission: RE | Admit: 2020-03-14 | Payer: Medicare HMO | Source: Home / Self Care | Admitting: Vascular Surgery

## 2020-03-14 SURGERY — UPPER EXTREMITY VENOGRAPHY
Anesthesia: LOCAL

## 2020-03-19 ENCOUNTER — Telehealth: Payer: Self-pay | Admitting: *Deleted

## 2020-03-19 ENCOUNTER — Ambulatory Visit (INDEPENDENT_AMBULATORY_CARE_PROVIDER_SITE_OTHER): Payer: Medicare HMO | Admitting: *Deleted

## 2020-03-19 DIAGNOSIS — I4729 Other ventricular tachycardia: Secondary | ICD-10-CM

## 2020-03-19 DIAGNOSIS — I4891 Unspecified atrial fibrillation: Secondary | ICD-10-CM | POA: Diagnosis not present

## 2020-03-19 DIAGNOSIS — Z008 Encounter for other general examination: Secondary | ICD-10-CM | POA: Diagnosis not present

## 2020-03-19 DIAGNOSIS — N529 Male erectile dysfunction, unspecified: Secondary | ICD-10-CM | POA: Diagnosis not present

## 2020-03-19 DIAGNOSIS — D6869 Other thrombophilia: Secondary | ICD-10-CM | POA: Diagnosis not present

## 2020-03-19 DIAGNOSIS — I472 Ventricular tachycardia: Secondary | ICD-10-CM

## 2020-03-19 DIAGNOSIS — Z7982 Long term (current) use of aspirin: Secondary | ICD-10-CM | POA: Diagnosis not present

## 2020-03-19 DIAGNOSIS — Z992 Dependence on renal dialysis: Secondary | ICD-10-CM | POA: Diagnosis not present

## 2020-03-19 DIAGNOSIS — K219 Gastro-esophageal reflux disease without esophagitis: Secondary | ICD-10-CM | POA: Diagnosis not present

## 2020-03-19 DIAGNOSIS — I2510 Atherosclerotic heart disease of native coronary artery without angina pectoris: Secondary | ICD-10-CM | POA: Diagnosis not present

## 2020-03-19 DIAGNOSIS — N186 End stage renal disease: Secondary | ICD-10-CM | POA: Diagnosis not present

## 2020-03-19 DIAGNOSIS — E1122 Type 2 diabetes mellitus with diabetic chronic kidney disease: Secondary | ICD-10-CM | POA: Diagnosis not present

## 2020-03-19 DIAGNOSIS — E569 Vitamin deficiency, unspecified: Secondary | ICD-10-CM | POA: Diagnosis not present

## 2020-03-19 LAB — CUP PACEART REMOTE DEVICE CHECK
Battery Impedance: 877 Ohm
Battery Remaining Longevity: 63 mo
Battery Voltage: 2.77 V
Brady Statistic AP VP Percent: 14 %
Brady Statistic AP VS Percent: 0 %
Brady Statistic AS VP Percent: 86 %
Brady Statistic AS VS Percent: 0 %
Date Time Interrogation Session: 20210608091910
Implantable Lead Implant Date: 20060418
Implantable Lead Implant Date: 20060418
Implantable Lead Location: 753859
Implantable Lead Location: 753860
Implantable Lead Model: 5076
Implantable Lead Model: 5076
Implantable Pulse Generator Implant Date: 20060418
Lead Channel Impedance Value: 425 Ohm
Lead Channel Impedance Value: 487 Ohm
Lead Channel Pacing Threshold Amplitude: 0.5 V
Lead Channel Pacing Threshold Amplitude: 0.5 V
Lead Channel Pacing Threshold Pulse Width: 0.4 ms
Lead Channel Pacing Threshold Pulse Width: 0.4 ms
Lead Channel Setting Pacing Amplitude: 1.5 V
Lead Channel Setting Pacing Amplitude: 2 V
Lead Channel Setting Pacing Pulse Width: 0.4 ms
Lead Channel Setting Sensing Sensitivity: 4 mV

## 2020-03-19 NOTE — Telephone Encounter (Signed)
Pt called and asked if he should continue the treatment on his left foot.

## 2020-03-19 NOTE — Telephone Encounter (Signed)
Message left for pt to call with more information concerning the treatments he had questioned.

## 2020-03-20 ENCOUNTER — Telehealth: Payer: Self-pay

## 2020-03-20 NOTE — Telephone Encounter (Signed)
Telephone call from patient stating he and daughter had several questions for Dr. Carlis Abbott and would like for [place] to contact his daughter Kore Madlock at [phone].   Spoke with [employer], who expressed that pt wants to know if there's a way to keep his dialysis catheter in his leg more permanent vs access placement in the arm d/t pain from access in the past. Informed daughter that the dialysis catheter is meant to be temporary and can increase the risk of infection when left in for extended period. She verbalized understanding and requested to know if any other alternatives.   Spoke with Dr. Carlis Abbott, who advised to schedule patient for a phone visit on next Tuesday. Daughter informed of appt information for phone visit 03/26/20 @11am - with check-in call appx 1045am.Voiced understanding. Minette Brine, RN

## 2020-03-20 NOTE — Progress Notes (Signed)
Remote pacemaker transmission.   

## 2020-03-25 ENCOUNTER — Telehealth: Payer: Self-pay | Admitting: Sports Medicine

## 2020-03-25 NOTE — Telephone Encounter (Signed)
Left message for pt to pad off the toe, wear wider shoes and contact our office for an appt.

## 2020-03-25 NOTE — Telephone Encounter (Signed)
Pt called and would like to know what to do with a painful corn between toes.  Pt states its very painful

## 2020-03-26 ENCOUNTER — Ambulatory Visit (INDEPENDENT_AMBULATORY_CARE_PROVIDER_SITE_OTHER): Payer: Medicare HMO | Admitting: Vascular Surgery

## 2020-03-26 ENCOUNTER — Other Ambulatory Visit: Payer: Self-pay

## 2020-03-26 ENCOUNTER — Encounter: Payer: Self-pay | Admitting: Vascular Surgery

## 2020-03-26 VITALS — Ht 66.0 in | Wt 130.0 lb

## 2020-03-26 DIAGNOSIS — N186 End stage renal disease: Secondary | ICD-10-CM
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
## 2020-03-28 ENCOUNTER — Other Ambulatory Visit: Payer: Self-pay

## 2020-03-29 DIAGNOSIS — T8249XA Other complication of vascular dialysis catheter, initial encounter: Secondary | ICD-10-CM | POA: Diagnosis not present

## 2020-03-29 DIAGNOSIS — Z992 Dependence on renal dialysis: Secondary | ICD-10-CM | POA: Diagnosis not present

## 2020-03-29 DIAGNOSIS — D631 Anemia in chronic kidney disease: Secondary | ICD-10-CM | POA: Diagnosis not present

## 2020-03-29 DIAGNOSIS — E162 Hypoglycemia, unspecified: Secondary | ICD-10-CM | POA: Diagnosis not present

## 2020-03-29 DIAGNOSIS — E119 Type 2 diabetes mellitus without complications: Secondary | ICD-10-CM | POA: Diagnosis not present

## 2020-03-29 DIAGNOSIS — N186 End stage renal disease: Secondary | ICD-10-CM | POA: Diagnosis not present

## 2020-03-29 DIAGNOSIS — E1129 Type 2 diabetes mellitus with other diabetic kidney complication: Secondary | ICD-10-CM | POA: Diagnosis not present

## 2020-03-29 DIAGNOSIS — N2581 Secondary hyperparathyroidism of renal origin: Secondary | ICD-10-CM | POA: Diagnosis not present

## 2020-03-29 DIAGNOSIS — D688 Other specified coagulation defects: Secondary | ICD-10-CM | POA: Diagnosis not present

## 2020-04-04 ENCOUNTER — Other Ambulatory Visit: Payer: Self-pay

## 2020-04-04 ENCOUNTER — Ambulatory Visit (INDEPENDENT_AMBULATORY_CARE_PROVIDER_SITE_OTHER): Payer: Medicare HMO | Admitting: Podiatry

## 2020-04-04 DIAGNOSIS — N186 End stage renal disease: Secondary | ICD-10-CM

## 2020-04-04 DIAGNOSIS — L84 Corns and callosities: Secondary | ICD-10-CM | POA: Diagnosis not present

## 2020-04-04 DIAGNOSIS — E119 Type 2 diabetes mellitus without complications: Secondary | ICD-10-CM

## 2020-04-04 DIAGNOSIS — M79672 Pain in left foot: Secondary | ICD-10-CM

## 2020-04-05 ENCOUNTER — Encounter: Payer: Self-pay | Admitting: Podiatry
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
## 2020-04-10 DIAGNOSIS — Z992 Dependence on renal dialysis: Secondary | ICD-10-CM | POA: Diagnosis not present

## 2020-04-10 DIAGNOSIS — I129 Hypertensive chronic kidney disease with stage 1 through stage 4 chronic kidney disease, or unspecified chronic kidney disease: Secondary | ICD-10-CM | POA: Diagnosis not present

## 2020-04-10 DIAGNOSIS — N186 End stage renal disease: Secondary | ICD-10-CM | POA: Diagnosis not present

## 2020-04-11 ENCOUNTER — Other Ambulatory Visit: Payer: Self-pay

## 2020-04-11 ENCOUNTER — Ambulatory Visit (HOSPITAL_COMMUNITY)
Admission: RE | Admit: 2020-04-11 | Discharge: 2020-04-11 | Disposition: A | Discharge status: Home / Self Care | Payer: Medicare HMO | Attending: Vascular Surgery | Admitting: Vascular Surgery

## 2020-04-11 ENCOUNTER — Encounter (HOSPITAL_COMMUNITY)
Admission: RE | Disposition: A | Discharge status: Home / Self Care | Payer: Self-pay | Source: Home / Self Care | Attending: Vascular Surgery

## 2020-04-11 DIAGNOSIS — Z79899 Other long term (current) drug therapy: Secondary | ICD-10-CM | POA: Diagnosis not present

## 2020-04-11 DIAGNOSIS — E785 Hyperlipidemia, unspecified: Secondary | ICD-10-CM | POA: Insufficient documentation

## 2020-04-11 DIAGNOSIS — Z950 Presence of cardiac pacemaker: Secondary | ICD-10-CM | POA: Diagnosis not present

## 2020-04-11 DIAGNOSIS — Z7982 Long term (current) use of aspirin: Secondary | ICD-10-CM | POA: Diagnosis not present

## 2020-04-11 DIAGNOSIS — I120 Hypertensive chronic kidney disease with stage 5 chronic kidney disease or end stage renal disease: Secondary | ICD-10-CM | POA: Diagnosis present

## 2020-04-11 DIAGNOSIS — E7800 Pure hypercholesterolemia, unspecified: Secondary | ICD-10-CM | POA: Diagnosis not present

## 2020-04-11 DIAGNOSIS — E1122 Type 2 diabetes mellitus with diabetic chronic kidney disease: Secondary | ICD-10-CM | POA: Diagnosis not present

## 2020-04-11 DIAGNOSIS — N186 End stage renal disease: Secondary | ICD-10-CM | POA: Diagnosis not present

## 2020-04-11 LAB — POCT I-STAT, CHEM 8
BUN: 41 mg/dL — ABNORMAL HIGH (ref 8–23)
Calcium, Ion: 1.1 mmol/L — ABNORMAL LOW (ref 1.15–1.40)
Chloride: 95 mmol/L — ABNORMAL LOW (ref 98–111)
Creatinine, Ser: 7.6 mg/dL — ABNORMAL HIGH (ref 0.61–1.24)
Glucose, Bld: 90 mg/dL (ref 70–99)
HCT: 37 % — ABNORMAL LOW (ref 39.0–52.0)
Hemoglobin: 12.6 g/dL — ABNORMAL LOW (ref 13.0–17.0)
Potassium: 5 mmol/L (ref 3.5–5.1)
Sodium: 135 mmol/L (ref 135–145)
TCO2: 35 mmol/L — ABNORMAL HIGH (ref 22–32)

## 2020-04-11 SURGERY — INVASIVE LAB ABORTED CASE

## 2020-04-11 MED ORDER — SODIUM CHLORIDE 0.9% FLUSH: 3.0000 mL | Freq: Two times a day (BID) | INTRAVENOUS | Status: DC

## 2020-04-11 MED ORDER — SODIUM CHLORIDE 0.9% FLUSH: 3.0000 mL | INTRAVENOUS | Status: DC | PRN

## 2020-04-11 MED ORDER — HEPARIN (PORCINE) IN NACL 1000-0.9 UT/500ML-% IV SOLN
INTRAVENOUS | Status: DC | PRN
  Administered 2020-04-11: 500 mL

## 2020-04-11 MED ORDER — SODIUM CHLORIDE 0.9 % IV SOLN: 250.0000 mL | INTRAVENOUS | Status: DC | PRN

## 2020-04-11 NOTE — Progress Notes (Addendum)
79 year old male that was scheduled for bilateral upper extremity venograms today to evaluate for future dialysis access.  He has a occluded right upper arm AV graft and a chronic indwelling left chest wall implant from ICD pacemaker.  I had multiple conversations with him and his daughters regarding the plan of care today.  Patient was put on the table and then became very upset stating he did not think he was here to have any evaluation for upper extremity access.  I had a lengthy discussion with him again about our goals today to evaluate options in his upper extremities for AV access after venogram.  States he is adamantly against any upper extremity access.  We have since canceled his venogram and have updated his daughters who were understanding of the plan of care today to do venograms and then decide what options we had moving forward.  He will remain catheter dependent at this time.  Discussed that he is welcome to follow-up with Dr. Jimmy Footman and can further discuss with his nephrologist.  [name], MD [facility] Office: [place]

## 2020-04-11 NOTE — Progress Notes (Signed)
Procedure cancelled prior to any sedation given. IV's removed and patient discharged after pre op completed. Daughter contacted for transportation and patient assisted in getting ready to leave.
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
## 2020-04-12 DIAGNOSIS — E1129 Type 2 diabetes mellitus with other diabetic kidney complication: Secondary | ICD-10-CM | POA: Diagnosis not present

## 2020-04-12 DIAGNOSIS — Z992 Dependence on renal dialysis: Secondary | ICD-10-CM | POA: Diagnosis not present

## 2020-04-12 DIAGNOSIS — T8249XA Other complication of vascular dialysis catheter, initial encounter: Secondary | ICD-10-CM | POA: Diagnosis not present

## 2020-04-12 DIAGNOSIS — D631 Anemia in chronic kidney disease: Secondary | ICD-10-CM | POA: Diagnosis not present

## 2020-04-12 DIAGNOSIS — N2581 Secondary hyperparathyroidism of renal origin: Secondary | ICD-10-CM | POA: Diagnosis not present

## 2020-04-12 DIAGNOSIS — N186 End stage renal disease: Secondary | ICD-10-CM | POA: Diagnosis not present

## 2020-04-12 DIAGNOSIS — E119 Type 2 diabetes mellitus without complications: Secondary | ICD-10-CM | POA: Diagnosis not present

## 2020-04-12 DIAGNOSIS — D688 Other specified coagulation defects: Secondary | ICD-10-CM | POA: Diagnosis not present

## 2020-04-29 DIAGNOSIS — N2581 Secondary hyperparathyroidism of renal origin: Secondary | ICD-10-CM | POA: Diagnosis not present

## 2020-04-29 DIAGNOSIS — N186 End stage renal disease: Secondary | ICD-10-CM | POA: Diagnosis not present

## 2020-04-29 DIAGNOSIS — E119 Type 2 diabetes mellitus without complications: Secondary | ICD-10-CM | POA: Diagnosis not present

## 2020-04-29 DIAGNOSIS — D688 Other specified coagulation defects: Secondary | ICD-10-CM | POA: Diagnosis not present

## 2020-04-29 DIAGNOSIS — T8249XA Other complication of vascular dialysis catheter, initial encounter: Secondary | ICD-10-CM | POA: Diagnosis not present

## 2020-04-29 DIAGNOSIS — E1129 Type 2 diabetes mellitus with other diabetic kidney complication: Secondary | ICD-10-CM | POA: Diagnosis not present

## 2020-04-29 DIAGNOSIS — D631 Anemia in chronic kidney disease: Secondary | ICD-10-CM | POA: Diagnosis not present

## 2020-04-29 DIAGNOSIS — Z992 Dependence on renal dialysis: Secondary | ICD-10-CM | POA: Diagnosis not present

## 2020-05-07 ENCOUNTER — Other Ambulatory Visit: Payer: Self-pay

## 2020-05-07 ENCOUNTER — Encounter: Payer: Self-pay | Admitting: Sports Medicine

## 2020-05-07 ENCOUNTER — Ambulatory Visit (INDEPENDENT_AMBULATORY_CARE_PROVIDER_SITE_OTHER): Payer: Medicare HMO | Admitting: Sports Medicine

## 2020-05-07 DIAGNOSIS — M79675 Pain in left toe(s): Secondary | ICD-10-CM | POA: Diagnosis not present

## 2020-05-07 DIAGNOSIS — M79674 Pain in right toe(s): Secondary | ICD-10-CM

## 2020-05-07 DIAGNOSIS — B351 Tinea unguium: Secondary | ICD-10-CM

## 2020-05-07 DIAGNOSIS — E119 Type 2 diabetes mellitus without complications: Secondary | ICD-10-CM

## 2020-05-07 DIAGNOSIS — L84 Corns and callosities: Secondary | ICD-10-CM

## 2020-05-07 DIAGNOSIS — M79672 Pain in left foot: Secondary | ICD-10-CM

## 2020-05-07 DIAGNOSIS — N186 End stage renal disease: Secondary | ICD-10-CM

## 2020-05-07 NOTE — Patient Instructions (Signed)
Vinegar soaks 1 cup of white distilled vinegar to 8 cups of warm water.  Soak 20 mins. May repeat soak two times per week.  If there is thickness to nails may file nails after soaks or after bath/shower with nail file and apply tea tree oil. Apply oil daily to nails after filing for the best result.  

## 2020-05-07 NOTE — Progress Notes (Signed)
Subjective: William [name] is a 79 y.o. male patient with history of diabetes who presents to office today complaining of callus on left foot and long,mildly painful nails  while ambulating in shoes; unable to trim. Patient states that the glucose reading this morning was not recorded; reports that they keep track at dialysis M/W/F like previous. Patient admits that his left callus has built back up and is painful and wants to know if he can come every month.  No other issues noted.   Patient Active Problem List   Diagnosis Date Noted  . Hypovitaminosis D 02/06/2020  . PAD (peripheral artery disease) ([place]) 02/06/2020  . Complication of vascular dialysis catheter 11/28/2019  . Allergy, unspecified, initial encounter 07/31/2019  . 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  . Hospital discharge follow-up 04/11/2019  . 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 Encounter for removal of sutures 12/13/2018  . Iron deficiency anemia, unspecified 11/30/2018  . Uremia syndrome   . ESRD (end stage renal disease) ([place]) 11/19/2018  . Malnutrition of moderate degree 11/19/2018  . 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 Chronic systolic (congestive) heart failure ([place]) 11/18/2018  . Fever, unspecified 11/18/2018  . 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 Pain, unspecified 11/18/2018  . Sarcoidosis, unspecified 11/18/2018  . 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 Shortness of breath 11/18/2018  . Type 2 diabetes mellitus with diabetic peripheral angiopathy without gangrene ([place]) 11/18/2018  . Acute on chronic diastolic congestive heart failure ([place])   . Abdominal pain   . Renal cyst 10/17/2018  . Ventricular tachycardia, non-sustained ([place])   . Decreased activities of daily living (ADL)   . Palliative care by specialist   . 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 Dehydration   . Gait instability 01/06/2018  . Type 2 diabetes mellitus  without complication, without long-term current use of insulin ([place]) 10/30/2017  . Benign essential HTN 10/30/2017  . Hyperlipidemia 10/30/2017  . Ethmoid sinusitis 07/11/2015  . Subdural hematoma ([place]) 07/08/2015  . CAD (coronary artery disease) 10/26/2014  . 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   Current Outpatient Medications on File Prior to Visit  Medication Sig Dispense Refill  . amiodarone (PACERONE) 200 MG tablet Take 1 tablet (200 mg total) by mouth 3 (three) times a week. Take on Mon, Wed, Fri 45 tablet 3  . Ascorbic Acid (VITAMIN C) 1000 MG tablet Take 1 tablet by mouth daily.    [name] aspirin 81 MG chewable tablet Chew 1 tablet (81 mg total) by mouth daily. 30 tablet 1  . B Complex-C-Folic Acid (RENAL-VITE) 0.8 MG TABS Take 1 tablet by mouth at bedtime.    . lidocaine-prilocaine (EMLA) cream Apply 1 application topically See admin instructions. APPLY SMALL AMOUNT TO ACCESS SITE (AVF) 3 TIMES A WEEK 1 HOUR BEFORE DIALYSIS. COVER WITH OCCLUSIVE DRESSING (SARAN WRAP)    . pantoprazole (PROTONIX) 20 MG tablet Take 1 tablet by mouth daily (Patient taking differently: Take 20 mg by mouth daily. ) 90 tablet 3  . polyethylene glycol (MIRALAX / GLYCOLAX) packet Take 17 g by mouth daily. (Patient taking differently: Take 17 g by mouth daily as needed for moderate constipation. ) 14 each 0  . rosuvastatin (CRESTOR) 10 MG tablet Take 1 tablet (10 mg total) by mouth daily at 6 PM. 30 tablet 0   No current facility-administered medications on file prior to visit.   Allergies  Allergen Reactions  . Carvedilol Other (  See Comments)    Makes him feel like his pelvis is "grinding" (??) Patient doesn't recall this, however    Recent Results (from the past 2160 hour(s))  CUP PACEART REMOTE DEVICE CHECK     Status: None   Collection Time: 03/19/20  9:19 AM  Result Value Ref Range   Date Time Interrogation Session [id number]    Pulse Generator Manufacturer MERM    Pulse  Gen Model ADDR01 Adapta    Pulse Gen Serial Number [id number] [facility] Name [place] Pulse Generator Type Implantable Pulse Generator    Implantable Pulse Generator Implant Date [id number]    Implantable Lead Manufacturer MERM    Implantable Lead Model 5076 CapSureFix Novus    Implantable Lead Serial Number [id number]    Implantable Lead Implant Date [id number]    Implantable Lead Location Detail 1 UNKNOWN    Implantable Lead Location [id number]    Implantable Lead Manufacturer MERM    Implantable Lead Model 5076 CapSureFix Novus    Implantable Lead Serial Number [id number]    Implantable Lead Implant Date [id number]    Implantable Lead Location Detail 1 UNKNOWN    Implantable Lead Location [phone]    Lead Channel Setting Sensing Sensitivity 4.00 mV   Lead Channel Setting Pacing Amplitude 1.500 V   Lead Channel Setting Pacing Pulse Width 0.40 ms   Lead Channel Setting Pacing Amplitude 2.000 V   Lead Channel Impedance Value 425 ohm   Lead Channel Pacing Threshold Amplitude 0.500 V   Lead Channel Pacing Threshold Pulse Width 0.40 ms   Lead Channel Impedance Value 487 ohm   Lead Channel Pacing Threshold Amplitude 0.500 V   Lead Channel Pacing Threshold Pulse Width 0.40 ms   Battery Status OK    Battery Remaining Longevity 63 mo   Battery Voltage 2.77 V   Battery Impedance 877 ohm   Brady Statistic AP VP Percent 14 %   Brady Statistic AS VP Percent 86 %   Brady Statistic AP VS Percent 0 %   Brady Statistic AS VS Percent 0 %  I-STAT, chem 8     Status: Abnormal   Collection Time: 04/11/20  8:23 AM  Result Value Ref Range   Sodium 135 135 - 145 mmol/L   Potassium 5.0 3.5 - 5.1 mmol/L   Chloride 95 (L) 98 - 111 mmol/L   BUN 41 (H) 8 - 23 mg/dL   Creatinine, Ser 7.60 (H) 0.61 - 1.24 mg/dL   Glucose, Bld 90 70 - 99 mg/dL    Comment: Glucose reference range applies only to samples taken after fasting for at least 8 hours.   Calcium, Ion 1.10 (L) 1.15 - 1.40 mmol/L    TCO2 35 (H) 22 - 32 mmol/L   Hemoglobin 12.6 (L) 13.0 - 17.0 g/dL   HCT 37.0 (L) 39 - 52 %    Objective: General: Patient is awake, alert, and oriented x 3 and in no acute distress.  Integument: Skin is warm, dry and supple bilateral. Nails are tender, long, thickened and dystrophic with subungual debris, consistent with onychomycosis, 1-5 bilateral. + callus sub met 5 on left. No signs of infection. Remaining integument unremarkable.  Vasculature:  Dorsalis Pedis pulse 1/4 bilateral. Posterior Tibial pulse  1/4 bilateral, faintly. Capillary fill time <5 sec 1-5 bilateral. Scant hair growth to the level of the digits. Temperature gradient within normal limits. No varicosities present bilateral. No edema present bilateral.   Neurology: The patient has intact  sensation measured with a 5.07/10g Semmes Weinstein Monofilament. No Babinski sign present bilateral.   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 Muscular strength 5/5 in all lower extremity muscular groups bilateral without pain on range of motion . No tenderness with calf compression bilateral.  Assessment and Plan: Problem List Items Addressed This Visit      Genitourinary   ESRD (end stage renal disease) ([place])    Other Visit Diagnoses    Pain due to onychomycosis of toenails of both feet    -  Primary   Callus of foot       Left foot pain       Diabetes mellitus without complication ([place])          -Examined patient. -Re-Discussed and educated patient on diabetic foot care, especially with regards to the vascular, neurological and musculoskeletal systems.  -Mechanically debrided all nails 1-5 bilateral using sterile nail nipper and filed with dremel without incident  -At no additional charge mechanically debrided callus using a sterile chisel blade plantar left foot submet 5 without incident -Dispened offloading padding in place in his shoe for the left fifth metatarsophalangeal joint at area of callus to offload  it -Advised patient that if he comes more frequently for nail and callus trim he may be bill for it -Recommend soaking feet using vinegar to help soften callus areas and to help with nails and to use tea tree oil -Answered all patient questions -Patient to return  in 3 months for at risk foot care -Patient advised to call the office if any problems or questions arise in the meantime.  Landis Martins, DPM

## 2020-05-11 DIAGNOSIS — I129 Hypertensive chronic kidney disease with stage 1 through stage 4 chronic kidney disease, or unspecified chronic kidney disease: Secondary | ICD-10-CM | POA: Diagnosis not present

## 2020-05-11 DIAGNOSIS — N186 End stage renal disease: Secondary | ICD-10-CM | POA: Diagnosis not present

## 2020-05-11 DIAGNOSIS — Z992 Dependence on renal dialysis: Secondary | ICD-10-CM | POA: Diagnosis not present

## 2020-05-13 DIAGNOSIS — Z992 Dependence on renal dialysis: Secondary | ICD-10-CM | POA: Diagnosis not present

## 2020-05-13 DIAGNOSIS — D688 Other specified coagulation defects: Secondary | ICD-10-CM | POA: Diagnosis not present

## 2020-05-13 DIAGNOSIS — N2581 Secondary hyperparathyroidism of renal origin: Secondary | ICD-10-CM | POA: Diagnosis not present

## 2020-05-13 DIAGNOSIS — E119 Type 2 diabetes mellitus without complications: Secondary | ICD-10-CM | POA: Diagnosis not present

## 2020-05-13 DIAGNOSIS — T8249XA Other complication of vascular dialysis catheter, initial encounter: Secondary | ICD-10-CM | POA: Diagnosis not present

## 2020-05-13 DIAGNOSIS — N186 End stage renal disease: Secondary | ICD-10-CM | POA: Diagnosis not present

## 2020-05-14 ENCOUNTER — Telehealth: Payer: Self-pay | Admitting: Cardiovascular Disease

## 2020-05-14 NOTE — Telephone Encounter (Signed)
Follow up   Pt returning call from [facility], he said the reason why he needs a referral because the practice needed one. He also added, his wife and Brother who is a doctor in [place] recommended Dr. Laurance Flatten, its for his Arm and leg f/u

## 2020-05-14 NOTE — Telephone Encounter (Signed)
He will need to get the referral from his primary MD

## 2020-05-14 NOTE — Telephone Encounter (Signed)
Left message for patient to call back  

## 2020-05-14 NOTE — Telephone Encounter (Signed)
LMTCB

## 2020-05-14 NOTE — Telephone Encounter (Signed)
New Message  Patient would like a referral to Vascular Specialist Dr. Dorrene German with [facility]. Phone number is [phone]. Please assist.

## 2020-05-15 NOTE — Telephone Encounter (Signed)
Called home number for William [name]. Spoke with his daughter William [name] and informed to request referral for vascular specialist from primary medical doctor.  She will let the patient know.

## 2020-05-23 ENCOUNTER — Telehealth: Payer: Self-pay | Admitting: Cardiovascular Disease

## 2020-05-23 NOTE — Telephone Encounter (Signed)
    Pt called to follow up referral he requesting, per notes advised he needs to call his PCP to get referral. Pt understood

## 2020-05-23 NOTE — Telephone Encounter (Signed)
Left detailed message with [facility] number, [phone]. to call to help with finding a PCP.  Advised to call back if any questions.

## 2020-05-23 NOTE — Telephone Encounter (Signed)
Patient is calling to follow up regarding referral. He is requesting a referral for a new PCP. He states PCP listed is incorrect. He contacted office of Dr. [name] and he was told he has never been seen there before. He states he will need to PCP for referral to vascular specialist. Please assist.

## 2020-05-29 DIAGNOSIS — T8249XA Other complication of vascular dialysis catheter, initial encounter: Secondary | ICD-10-CM | POA: Diagnosis not present

## 2020-05-29 DIAGNOSIS — D688 Other specified coagulation defects: Secondary | ICD-10-CM | POA: Diagnosis not present

## 2020-05-29 DIAGNOSIS — N2581 Secondary hyperparathyroidism of renal origin: Secondary | ICD-10-CM | POA: Diagnosis not present

## 2020-05-29 DIAGNOSIS — N186 End stage renal disease: Secondary | ICD-10-CM | POA: Diagnosis not present

## 2020-05-29 DIAGNOSIS — Z992 Dependence on renal dialysis: Secondary | ICD-10-CM | POA: Diagnosis not present

## 2020-05-29 DIAGNOSIS — E119 Type 2 diabetes mellitus without complications: Secondary | ICD-10-CM | POA: Diagnosis not present

## 2020-06-11 DIAGNOSIS — N186 End stage renal disease: Secondary | ICD-10-CM | POA: Diagnosis not present

## 2020-06-11 DIAGNOSIS — Z992 Dependence on renal dialysis: Secondary | ICD-10-CM | POA: Diagnosis not present

## 2020-06-11 DIAGNOSIS — I129 Hypertensive chronic kidney disease with stage 1 through stage 4 chronic kidney disease, or unspecified chronic kidney disease: Secondary | ICD-10-CM | POA: Diagnosis not present

## 2020-06-12 DIAGNOSIS — N186 End stage renal disease: Secondary | ICD-10-CM | POA: Diagnosis not present

## 2020-06-12 DIAGNOSIS — D688 Other specified coagulation defects: Secondary | ICD-10-CM | POA: Diagnosis not present

## 2020-06-12 DIAGNOSIS — N2581 Secondary hyperparathyroidism of renal origin: Secondary | ICD-10-CM | POA: Diagnosis not present

## 2020-06-12 DIAGNOSIS — T8249XA Other complication of vascular dialysis catheter, initial encounter: Secondary | ICD-10-CM | POA: Diagnosis not present

## 2020-06-12 DIAGNOSIS — Z992 Dependence on renal dialysis: Secondary | ICD-10-CM | POA: Diagnosis not present

## 2020-06-12 DIAGNOSIS — E119 Type 2 diabetes mellitus without complications: Secondary | ICD-10-CM | POA: Diagnosis not present

## 2020-06-12 DIAGNOSIS — D631 Anemia in chronic kidney disease: Secondary | ICD-10-CM | POA: Diagnosis not present

## 2020-06-18 ENCOUNTER — Ambulatory Visit (INDEPENDENT_AMBULATORY_CARE_PROVIDER_SITE_OTHER): Payer: Medicare HMO | Admitting: *Deleted

## 2020-06-18 DIAGNOSIS — I472 Ventricular tachycardia: Secondary | ICD-10-CM | POA: Diagnosis not present

## 2020-06-18 DIAGNOSIS — I4729 Other ventricular tachycardia: Secondary | ICD-10-CM

## 2020-06-20 LAB — CUP PACEART REMOTE DEVICE CHECK
Battery Impedance: 1008 Ohm
Battery Remaining Longevity: 58 mo
Battery Voltage: 2.77 V
Brady Statistic AP VP Percent: 12 %
Brady Statistic AP VS Percent: 0 %
Brady Statistic AS VP Percent: 88 %
Brady Statistic AS VS Percent: 0 %
Date Time Interrogation Session: 20210909084801
Implantable Lead Implant Date: 20060418
Implantable Lead Implant Date: 20060418
Implantable Lead Location: 753859
Implantable Lead Location: 753860
Implantable Lead Model: 5076
Implantable Lead Model: 5076
Implantable Pulse Generator Implant Date: 20060418
Lead Channel Impedance Value: 425 Ohm
Lead Channel Impedance Value: 517 Ohm
Lead Channel Pacing Threshold Amplitude: 0.5 V
Lead Channel Pacing Threshold Amplitude: 0.5 V
Lead Channel Pacing Threshold Pulse Width: 0.4 ms
Lead Channel Pacing Threshold Pulse Width: 0.4 ms
Lead Channel Setting Pacing Amplitude: 1.5 V
Lead Channel Setting Pacing Amplitude: 2 V
Lead Channel Setting Pacing Pulse Width: 0.4 ms
Lead Channel Setting Sensing Sensitivity: 4 mV

## 2020-06-21 NOTE — Progress Notes (Signed)
Remote pacemaker transmission.   

## 2020-07-01 DIAGNOSIS — D631 Anemia in chronic kidney disease: Secondary | ICD-10-CM | POA: Diagnosis not present

## 2020-07-01 DIAGNOSIS — E119 Type 2 diabetes mellitus without complications: Secondary | ICD-10-CM | POA: Diagnosis not present

## 2020-07-01 DIAGNOSIS — D688 Other specified coagulation defects: Secondary | ICD-10-CM | POA: Diagnosis not present

## 2020-07-01 DIAGNOSIS — N2581 Secondary hyperparathyroidism of renal origin: Secondary | ICD-10-CM | POA: Diagnosis not present

## 2020-07-01 DIAGNOSIS — N186 End stage renal disease: Secondary | ICD-10-CM | POA: Diagnosis not present

## 2020-07-01 DIAGNOSIS — T8249XA Other complication of vascular dialysis catheter, initial encounter: Secondary | ICD-10-CM | POA: Diagnosis not present

## 2020-07-01 DIAGNOSIS — Z992 Dependence on renal dialysis: Secondary | ICD-10-CM | POA: Diagnosis not present

## 2020-07-11 DIAGNOSIS — I129 Hypertensive chronic kidney disease with stage 1 through stage 4 chronic kidney disease, or unspecified chronic kidney disease: Secondary | ICD-10-CM | POA: Diagnosis not present

## 2020-07-11 DIAGNOSIS — Z992 Dependence on renal dialysis: Secondary | ICD-10-CM | POA: Diagnosis not present

## 2020-07-11 DIAGNOSIS — N186 End stage renal disease: Secondary | ICD-10-CM | POA: Diagnosis not present

## 2020-07-12 DIAGNOSIS — D688 Other specified coagulation defects: Secondary | ICD-10-CM | POA: Diagnosis not present

## 2020-07-12 DIAGNOSIS — T8249XA Other complication of vascular dialysis catheter, initial encounter: Secondary | ICD-10-CM | POA: Diagnosis not present

## 2020-07-12 DIAGNOSIS — E119 Type 2 diabetes mellitus without complications: Secondary | ICD-10-CM | POA: Diagnosis not present

## 2020-07-12 DIAGNOSIS — D631 Anemia in chronic kidney disease: Secondary | ICD-10-CM | POA: Diagnosis not present

## 2020-07-12 DIAGNOSIS — Z992 Dependence on renal dialysis: Secondary | ICD-10-CM | POA: Diagnosis not present

## 2020-07-12 DIAGNOSIS — N2581 Secondary hyperparathyroidism of renal origin: Secondary | ICD-10-CM | POA: Diagnosis not present

## 2020-07-12 DIAGNOSIS — D509 Iron deficiency anemia, unspecified: Secondary | ICD-10-CM | POA: Diagnosis not present

## 2020-07-12 DIAGNOSIS — N186 End stage renal disease: Secondary | ICD-10-CM | POA: Diagnosis not present

## 2020-07-25 DIAGNOSIS — N186 End stage renal disease: Secondary | ICD-10-CM | POA: Diagnosis not present

## 2020-07-31 DIAGNOSIS — D631 Anemia in chronic kidney disease: Secondary | ICD-10-CM | POA: Diagnosis not present

## 2020-07-31 DIAGNOSIS — D688 Other specified coagulation defects: Secondary | ICD-10-CM | POA: Diagnosis not present

## 2020-07-31 DIAGNOSIS — N186 End stage renal disease: Secondary | ICD-10-CM | POA: Diagnosis not present

## 2020-07-31 DIAGNOSIS — T8249XA Other complication of vascular dialysis catheter, initial encounter: Secondary | ICD-10-CM | POA: Diagnosis not present

## 2020-07-31 DIAGNOSIS — E119 Type 2 diabetes mellitus without complications: Secondary | ICD-10-CM | POA: Diagnosis not present

## 2020-07-31 DIAGNOSIS — Z992 Dependence on renal dialysis: Secondary | ICD-10-CM | POA: Diagnosis not present

## 2020-07-31 DIAGNOSIS — D509 Iron deficiency anemia, unspecified: Secondary | ICD-10-CM | POA: Diagnosis not present

## 2020-07-31 DIAGNOSIS — N2581 Secondary hyperparathyroidism of renal origin: Secondary | ICD-10-CM | POA: Diagnosis not present

## 2020-08-08 ENCOUNTER — Ambulatory Visit: Payer: Medicare HMO | Admitting: Sports Medicine

## 2020-08-11 DIAGNOSIS — Z992 Dependence on renal dialysis: Secondary | ICD-10-CM | POA: Diagnosis not present

## 2020-08-11 DIAGNOSIS — I129 Hypertensive chronic kidney disease with stage 1 through stage 4 chronic kidney disease, or unspecified chronic kidney disease: Secondary | ICD-10-CM | POA: Diagnosis not present

## 2020-08-11 DIAGNOSIS — N186 End stage renal disease: Secondary | ICD-10-CM | POA: Diagnosis not present

## 2020-08-12 DIAGNOSIS — T8249XA Other complication of vascular dialysis catheter, initial encounter: Secondary | ICD-10-CM | POA: Diagnosis not present

## 2020-08-12 DIAGNOSIS — D688 Other specified coagulation defects: Secondary | ICD-10-CM | POA: Diagnosis not present

## 2020-08-12 DIAGNOSIS — Z111 Encounter for screening for respiratory tuberculosis: Secondary | ICD-10-CM | POA: Diagnosis not present

## 2020-08-12 DIAGNOSIS — D631 Anemia in chronic kidney disease: Secondary | ICD-10-CM | POA: Diagnosis not present

## 2020-08-12 DIAGNOSIS — N186 End stage renal disease: Secondary | ICD-10-CM | POA: Diagnosis not present

## 2020-08-12 DIAGNOSIS — N2581 Secondary hyperparathyroidism of renal origin: Secondary | ICD-10-CM | POA: Diagnosis not present

## 2020-08-12 DIAGNOSIS — E119 Type 2 diabetes mellitus without complications: Secondary | ICD-10-CM | POA: Diagnosis not present

## 2020-08-12 DIAGNOSIS — D509 Iron deficiency anemia, unspecified: Secondary | ICD-10-CM | POA: Diagnosis not present

## 2020-08-12 DIAGNOSIS — Z992 Dependence on renal dialysis: Secondary | ICD-10-CM | POA: Diagnosis not present

## 2020-08-27 ENCOUNTER — Other Ambulatory Visit: Payer: Self-pay | Admitting: Cardiovascular Disease

## 2020-08-28 DIAGNOSIS — E119 Type 2 diabetes mellitus without complications: Secondary | ICD-10-CM | POA: Diagnosis not present

## 2020-08-28 DIAGNOSIS — Z111 Encounter for screening for respiratory tuberculosis: Secondary | ICD-10-CM | POA: Diagnosis not present

## 2020-08-28 DIAGNOSIS — D631 Anemia in chronic kidney disease: Secondary | ICD-10-CM | POA: Diagnosis not present

## 2020-08-28 DIAGNOSIS — N186 End stage renal disease: Secondary | ICD-10-CM | POA: Diagnosis not present

## 2020-08-28 DIAGNOSIS — D509 Iron deficiency anemia, unspecified: Secondary | ICD-10-CM | POA: Diagnosis not present

## 2020-08-28 DIAGNOSIS — Z992 Dependence on renal dialysis: Secondary | ICD-10-CM | POA: Diagnosis not present

## 2020-08-28 DIAGNOSIS — D688 Other specified coagulation defects: Secondary | ICD-10-CM | POA: Diagnosis not present

## 2020-08-28 DIAGNOSIS — T8249XA Other complication of vascular dialysis catheter, initial encounter: Secondary | ICD-10-CM | POA: Diagnosis not present

## 2020-08-28 DIAGNOSIS — N2581 Secondary hyperparathyroidism of renal origin: Secondary | ICD-10-CM | POA: Diagnosis not present

## 2020-09-03 ENCOUNTER — Telehealth (INDEPENDENT_AMBULATORY_CARE_PROVIDER_SITE_OTHER): Payer: Medicare HMO | Admitting: Cardiovascular Disease

## 2020-09-03 ENCOUNTER — Other Ambulatory Visit: Payer: Self-pay

## 2020-09-03 VITALS — Ht 66.0 in

## 2020-09-03 DIAGNOSIS — I5022 Chronic systolic (congestive) heart failure: Secondary | ICD-10-CM | POA: Diagnosis not present

## 2020-09-03 NOTE — Patient Instructions (Signed)
Medication Instructions:  Your provider recommends that you continue on your current medications as directed. Please refer to the Current Medication list given to you today.   *If you need a refill on your cardiac medications before your next appointment, please call your pharmacy*  Follow-Up: At [facility], you and your health needs are our priority.  As part of our continuing mission to provide you with exceptional heart care, we have created designated Provider Care Teams.  These Care Teams include your primary Cardiologist (physician) and Advanced Practice Providers (APPs -  Physician Assistants and Nurse Practitioners) who all work together to provide you with the care you need, when you need it. Your next appointment:   12 month(s) The format for your next appointment:   In Person Provider:   You may see Philip Nahser, MD or one of the following Advanced Practice Providers on your designated Care Team:    Scott Weaver, PA-C  Vin Bhagat, PA-C   
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
## 2020-09-07 ENCOUNTER — Observation Stay (HOSPITAL_COMMUNITY)
Admission: EM | Admit: 2020-09-07 | Discharge: 2020-09-11 | Disposition: A | Discharge status: Home / Self Care | Payer: Medicare HMO | Attending: Internal Medicine | Admitting: Internal Medicine

## 2020-09-07 ENCOUNTER — Other Ambulatory Visit: Payer: Self-pay

## 2020-09-07 ENCOUNTER — Encounter (HOSPITAL_COMMUNITY): Payer: Self-pay | Admitting: Emergency Medicine

## 2020-09-07 ENCOUNTER — Emergency Department (HOSPITAL_COMMUNITY): Payer: Medicare HMO

## 2020-09-07 DIAGNOSIS — J320 Chronic maxillary sinusitis: Secondary | ICD-10-CM | POA: Diagnosis not present

## 2020-09-07 DIAGNOSIS — Z20822 Contact with and (suspected) exposure to covid-19: Secondary | ICD-10-CM | POA: Diagnosis not present

## 2020-09-07 DIAGNOSIS — E162 Hypoglycemia, unspecified: Secondary | ICD-10-CM | POA: Diagnosis not present

## 2020-09-07 DIAGNOSIS — I4729 Other ventricular tachycardia: Secondary | ICD-10-CM

## 2020-09-07 DIAGNOSIS — R531 Weakness: Secondary | ICD-10-CM | POA: Diagnosis not present

## 2020-09-07 DIAGNOSIS — R262 Difficulty in walking, not elsewhere classified: Secondary | ICD-10-CM | POA: Insufficient documentation

## 2020-09-07 DIAGNOSIS — Z992 Dependence on renal dialysis: Secondary | ICD-10-CM | POA: Diagnosis not present

## 2020-09-07 DIAGNOSIS — R52 Pain, unspecified: Secondary | ICD-10-CM

## 2020-09-07 DIAGNOSIS — N186 End stage renal disease: Secondary | ICD-10-CM | POA: Diagnosis not present

## 2020-09-07 DIAGNOSIS — Z7982 Long term (current) use of aspirin: Secondary | ICD-10-CM | POA: Diagnosis not present

## 2020-09-07 DIAGNOSIS — S0990XA Unspecified injury of head, initial encounter: Secondary | ICD-10-CM | POA: Diagnosis not present

## 2020-09-07 DIAGNOSIS — E119 Type 2 diabetes mellitus without complications: Secondary | ICD-10-CM | POA: Insufficient documentation

## 2020-09-07 DIAGNOSIS — I472 Ventricular tachycardia: Secondary | ICD-10-CM

## 2020-09-07 DIAGNOSIS — J0100 Acute maxillary sinusitis, unspecified: Secondary | ICD-10-CM | POA: Diagnosis not present

## 2020-09-07 DIAGNOSIS — R2689 Other abnormalities of gait and mobility: Secondary | ICD-10-CM | POA: Insufficient documentation

## 2020-09-07 DIAGNOSIS — R55 Syncope and collapse: Principal | ICD-10-CM | POA: Diagnosis present

## 2020-09-07 DIAGNOSIS — M549 Dorsalgia, unspecified: Secondary | ICD-10-CM | POA: Diagnosis not present

## 2020-09-07 DIAGNOSIS — I10 Essential (primary) hypertension: Secondary | ICD-10-CM | POA: Diagnosis not present

## 2020-09-07 DIAGNOSIS — I120 Hypertensive chronic kidney disease with stage 5 chronic kidney disease or end stage renal disease: Secondary | ICD-10-CM | POA: Insufficient documentation

## 2020-09-07 DIAGNOSIS — I2510 Atherosclerotic heart disease of native coronary artery without angina pectoris: Secondary | ICD-10-CM | POA: Diagnosis present

## 2020-09-07 DIAGNOSIS — J322 Chronic ethmoidal sinusitis: Secondary | ICD-10-CM | POA: Diagnosis not present

## 2020-09-07 DIAGNOSIS — E161 Other hypoglycemia: Secondary | ICD-10-CM | POA: Diagnosis not present

## 2020-09-07 LAB — CBC WITH DIFFERENTIAL/PLATELET
Abs Immature Granulocytes: 0.02 10*3/uL (ref 0.00–0.07)
Basophils Absolute: 0.1 10*3/uL (ref 0.0–0.1)
Basophils Relative: 1 %
Eosinophils Absolute: 0 10*3/uL (ref 0.0–0.5)
Eosinophils Relative: 0 %
HCT: 42.7 % (ref 39.0–52.0)
Hemoglobin: 13.4 g/dL (ref 13.0–17.0)
Immature Granulocytes: 0 %
Lymphocytes Relative: 8 %
Lymphs Abs: 0.8 10*3/uL (ref 0.7–4.0)
MCH: 32.8 pg (ref 26.0–34.0)
MCHC: 31.4 g/dL (ref 30.0–36.0)
MCV: 104.7 fL — ABNORMAL HIGH (ref 80.0–100.0)
Monocytes Absolute: 1.3 10*3/uL — ABNORMAL HIGH (ref 0.1–1.0)
Monocytes Relative: 13 %
Neutro Abs: 8 10*3/uL — ABNORMAL HIGH (ref 1.7–7.7)
Neutrophils Relative %: 78 %
Platelets: 219 10*3/uL (ref 150–400)
RBC: 4.08 MIL/uL — ABNORMAL LOW (ref 4.22–5.81)
RDW: 16.4 % — ABNORMAL HIGH (ref 11.5–15.5)
WBC: 10.1 10*3/uL (ref 4.0–10.5)
nRBC: 0 % (ref 0.0–0.2)

## 2020-09-07 NOTE — ED Provider Notes (Signed)
Plan at signout is to f/u on labs, orthostatics and reassess.  If labs unremarkable and ambulatory he can be discharged home    Ripley Fraise, MD 09/07/20 2327

## 2020-09-07 NOTE — ED Notes (Signed)
Patient reminded of need of urine sample.  Patient requested water and given.

## 2020-09-07 NOTE — ED Triage Notes (Signed)
Patient walked thru his door last night at 1830 when he got home from dialysis and doesn't remember anything after walking thru the front door and remembers waking up at some point.  Patient was on the floor since last night until family found him at 1830 this evening.  Patient has urinated on himself.  He had moved himself thru the house but unable to get up.  He was able to stand with EMS help.  He was able to walk with assistance.  Patient is hypertensive.  He has not been able to take his meds and has missed three doses.  Patient is CAOx4 at this time.

## 2020-09-07 NOTE — ED Notes (Signed)
Called from lab and was told blood hemolyzed.

## 2020-09-07 NOTE — ED Notes (Signed)
Patient taken to CT.
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
## 2020-09-07 NOTE — ED Notes (Signed)
Patient returned from CT

## 2020-09-08 ENCOUNTER — Encounter (HOSPITAL_COMMUNITY): Payer: Self-pay | Admitting: Family Medicine

## 2020-09-08 DIAGNOSIS — R55 Syncope and collapse: Secondary | ICD-10-CM

## 2020-09-08 DIAGNOSIS — I2510 Atherosclerotic heart disease of native coronary artery without angina pectoris: Secondary | ICD-10-CM

## 2020-09-08 HISTORY — DX: Syncope and collapse: R55

## 2020-09-08 LAB — COMPREHENSIVE METABOLIC PANEL
ALT: 22 U/L (ref 0–44)
AST: 47 U/L — ABNORMAL HIGH (ref 15–41)
Albumin: 3 g/dL — ABNORMAL LOW (ref 3.5–5.0)
Alkaline Phosphatase: 60 U/L (ref 38–126)
Anion gap: 18 — ABNORMAL HIGH (ref 5–15)
BUN: 63 mg/dL — ABNORMAL HIGH (ref 8–23)
CO2: 22 mmol/L (ref 22–32)
Calcium: 9.2 mg/dL (ref 8.9–10.3)
Chloride: 96 mmol/L — ABNORMAL LOW (ref 98–111)
Creatinine, Ser: 9.28 mg/dL — ABNORMAL HIGH (ref 0.61–1.24)
GFR, Estimated: 5 mL/min — ABNORMAL LOW (ref >60)
Glucose, Bld: 64 mg/dL — ABNORMAL LOW (ref 70–99)
Potassium: 4.4 mmol/L (ref 3.5–5.1)
Sodium: 136 mmol/L (ref 135–145)
Total Bilirubin: 1.1 mg/dL (ref 0.3–1.2)
Total Protein: 7 g/dL (ref 6.5–8.1)

## 2020-09-08 LAB — BASIC METABOLIC PANEL
Anion gap: 19 — ABNORMAL HIGH (ref 5–15)
BUN: 67 mg/dL — ABNORMAL HIGH (ref 8–23)
CO2: 23 mmol/L (ref 22–32)
Calcium: 9.4 mg/dL (ref 8.9–10.3)
Chloride: 94 mmol/L — ABNORMAL LOW (ref 98–111)
Creatinine, Ser: 9.68 mg/dL — ABNORMAL HIGH (ref 0.61–1.24)
GFR, Estimated: 5 mL/min — ABNORMAL LOW (ref >60)
Glucose, Bld: 108 mg/dL — ABNORMAL HIGH (ref 70–99)
Potassium: 4.3 mmol/L (ref 3.5–5.1)
Sodium: 136 mmol/L (ref 135–145)

## 2020-09-08 LAB — CBC
HCT: 39.7 % (ref 39.0–52.0)
Hemoglobin: 12.7 g/dL — ABNORMAL LOW (ref 13.0–17.0)
MCH: 32.5 pg (ref 26.0–34.0)
MCHC: 32 g/dL (ref 30.0–36.0)
MCV: 101.5 fL — ABNORMAL HIGH (ref 80.0–100.0)
Platelets: 239 10*3/uL (ref 150–400)
RBC: 3.91 MIL/uL — ABNORMAL LOW (ref 4.22–5.81)
RDW: 16.3 % — ABNORMAL HIGH (ref 11.5–15.5)
WBC: 9.5 10*3/uL (ref 4.0–10.5)
nRBC: 0 % (ref 0.0–0.2)

## 2020-09-08 LAB — URINALYSIS, ROUTINE W REFLEX MICROSCOPIC
Bilirubin Urine: NEGATIVE
Glucose, UA: NEGATIVE mg/dL
Ketones, ur: NEGATIVE mg/dL
Leukocytes,Ua: NEGATIVE
Nitrite: NEGATIVE
Protein, ur: 100 mg/dL — AB
Specific Gravity, Urine: 1.025 (ref 1.005–1.030)
pH: 5.5 (ref 5.0–8.0)

## 2020-09-08 LAB — URINALYSIS, MICROSCOPIC (REFLEX)

## 2020-09-08 LAB — CBG MONITORING, ED
Glucose-Capillary: 50 mg/dL — ABNORMAL LOW (ref 70–99)
Glucose-Capillary: 50 mg/dL — ABNORMAL LOW (ref 70–99)
Glucose-Capillary: 61 mg/dL — ABNORMAL LOW (ref 70–99)

## 2020-09-08 LAB — RESP PANEL BY RT-PCR (FLU A&B, COVID) ARPGX2
Influenza A by PCR: NEGATIVE
Influenza B by PCR: NEGATIVE
SARS Coronavirus 2 by RT PCR: NEGATIVE

## 2020-09-08 LAB — HEMOGLOBIN A1C
Hgb A1c MFr Bld: 5 % (ref 4.8–5.6)
Mean Plasma Glucose: 96.8 mg/dL

## 2020-09-08 LAB — GLUCOSE, CAPILLARY
Glucose-Capillary: 93 mg/dL (ref 70–99)
Glucose-Capillary: 208 mg/dL — ABNORMAL HIGH (ref 70–99)
Glucose-Capillary: 112 mg/dL — ABNORMAL HIGH (ref 70–99)
Glucose-Capillary: 167 mg/dL — ABNORMAL HIGH (ref 70–99)

## 2020-09-08 LAB — CK: Total CK: 1252 U/L — ABNORMAL HIGH (ref 49–397)

## 2020-09-08 LAB — TSH: TSH: 1.683 u[IU]/mL (ref 0.350–4.500)

## 2020-09-08 LAB — CORTISOL: Cortisol, Plasma: 23.1 ug/dL

## 2020-09-08 LAB — BETA-HYDROXYBUTYRIC ACID: Beta-Hydroxybutyric Acid: 1.11 mmol/L — ABNORMAL HIGH (ref 0.05–0.27)

## 2020-09-08 MED ORDER — ASPIRIN EC 81 MG PO TBEC
81.0000 mg | DELAYED_RELEASE_TABLET | Freq: Every day | ORAL | Status: DC
  Administered 2020-09-08 – 2020-09-11 (×4): 81 mg via ORAL
  Filled 2020-09-08 (×4): qty 1

## 2020-09-08 MED ORDER — ACETAMINOPHEN 325 MG PO TABS: 650.0000 mg | ORAL_TABLET | Freq: Four times a day (QID) | ORAL | Status: DC | PRN

## 2020-09-08 MED ORDER — ROSUVASTATIN CALCIUM 5 MG PO TABS
10.0000 mg | ORAL_TABLET | Freq: Every day | ORAL | Status: DC
  Administered 2020-09-10 – 2020-09-11 (×2): 10 mg via ORAL
  Filled 2020-09-08 (×3): qty 2

## 2020-09-08 MED ORDER — ACETAMINOPHEN 650 MG RE SUPP: 650.0000 mg | Freq: Four times a day (QID) | RECTAL | Status: DC | PRN

## 2020-09-08 MED ORDER — SODIUM CHLORIDE 0.9 % IV SOLN: 250.0000 mL | INTRAVENOUS | Status: DC | PRN

## 2020-09-08 MED ORDER — ONDANSETRON HCL 4 MG/2ML IJ SOLN: 4.0000 mg | Freq: Four times a day (QID) | INTRAMUSCULAR | Status: DC | PRN

## 2020-09-08 MED ORDER — ONDANSETRON HCL 4 MG PO TABS: 4.0000 mg | ORAL_TABLET | Freq: Four times a day (QID) | ORAL | Status: DC | PRN

## 2020-09-08 MED ORDER — INSULIN ASPART 100 UNIT/ML ~~LOC~~ SOLN
0.0000 [IU] | Freq: Three times a day (TID) | SUBCUTANEOUS | Status: DC
  Administered 2020-09-09: 2 [IU] via SUBCUTANEOUS

## 2020-09-08 MED ORDER — SODIUM CHLORIDE 0.9% FLUSH
3.0000 mL | Freq: Two times a day (BID) | INTRAVENOUS | Status: DC
  Administered 2020-09-08 – 2020-09-10 (×6): 3 mL via INTRAVENOUS

## 2020-09-08 MED ORDER — HEPARIN SODIUM (PORCINE) 5000 UNIT/ML IJ SOLN
5000.0000 [IU] | Freq: Three times a day (TID) | INTRAMUSCULAR | Status: DC
  Administered 2020-09-08 – 2020-09-11 (×8): 5000 [IU] via SUBCUTANEOUS
  Filled 2020-09-08 (×5): qty 1
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
## 2020-09-08 NOTE — ED Notes (Signed)
Notified Dr. Myna Hidalgo about sugars and he suggested juice. He said this is what is happening to him and why he is passing out.

## 2020-09-08 NOTE — Plan of Care (Signed)

## 2020-09-08 NOTE — Progress Notes (Addendum)
PROGRESS NOTE  William [name] NOB:[id number] DOB: [birth year] DOA: 09/07/2020 PCP: Patient, No Pcp Per  Brief History   William [name] is a 79 y.o. male with medical history significant for ESRD on hemodialysis, history of diabetes mellitus, hypertension, nonsustained ventricular tachycardia, and CAD, now presenting to emergency department after a transient loss of consciousness.  The patient reports that he went to dialysis on 09/06/2020, was feeling generally weak and with a vague sense of unwellness, but completed the session and returned home where he remembers entering his house and then waking up on the floor the following morning.  He felt generally weak and had had urinary incontinence, was unable to get up from the floor, and remained there until family came last night.  He denies any recent fevers, chills, chest pain, or palpitations.  He has not noticed any change in his chronic mild ankle swelling and denies any leg tenderness or hemoptysis.  He does not believe that he takes diabetes medications any longer.  ED Course: Upon arrival to the ED, patient is found to be afebrile, saturating well on room air, and with stable blood pressure.  EKG features sinus rhythm with LBBB.  Noncontrast head CT is negative for acute intracranial abnormality.  Chemistry panel features a normal potassium, normal bicarbonate, BUN of 63, and glucose 64.  CBC notable for macrocytosis without anemia.  Serum CK is mildly elevated.  Pacer was interrogated and no events noted per ED physician report.  COVID-19 was negative.  [facility] were consulted to admit the patient.   For this writer the patient stated that his first syncopal episode was on Wednesday. He states that he was eating normal prior to Wednesday. He states that he was unable to eat after Wednesday, because he was "sick". He is unable to tell me what that means other than he was very weak. He does not endorse hypoglycemia as a cause for his  syncope.  Consultants  . None  Procedures  . None  Antibiotics   Anti-infectives (From admission, onward)   None    .  Subjective  The patient is resting quietly. No new complaints. He states that he does not feel well, but is unable to put a finer point on it.  Objective   Vitals:  Vitals:   09/08/20 0245 09/08/20 0400  BP: (!) 152/83 137/77  Pulse: 79 87  Resp: 15 16  Temp:  98.8 F (37.1 C)  SpO2: 100% 100%   Exam:  Constitutional:  . The patient is awake, alert, and oriented x 3. No acute distress. Respiratory:  . No increased work of breathing. . No wheezes, rales, or rhonchi . No tactile fremitus Cardiovascular:  . Regular rate and rhythm . No murmurs, ectopy, or gallups. . No lateral PMI. No thrills. Abdomen:  . Abdomen is soft, non-tender, non-distended . No hernias, masses, or organomegaly . Normoactive bowel sounds.  Musculoskeletal:  . No cyanosis, clubbing, or edema Skin:  . No rashes, lesions, ulcers . palpation of skin: no induration or nodules Neurologic:  . CN 2-12 intact . Sensation all 4 extremities intact Psychiatric:  . Mental status o Mood, affect appropriate o Orientation to person, place, time  . judgment and insight appear intact  I have personally reviewed the following:   Today's Data  . Vitals, CBC, BMP  Micro Data  . Blood culture  Imaging  . CT head  Cardiology Data  . EKG: Sinus rhythm with LBBB which is new since previous  EKG in 11/09/2019.  Scheduled Meds: . aspirin EC  81 mg Oral Daily  . heparin  5,000 Units Subcutaneous Q8H  . insulin aspart  0-6 Units Subcutaneous TID WC  . rosuvastatin  10 mg Oral Daily  . sodium chloride flush  3 mL Intravenous Q12H   Continuous Infusions: . sodium chloride      Principal Problem:   Transient loss of consciousness Active Problems:   Ventricular tachycardia, non-sustained (HCC)   CAD (coronary artery disease)   LOS: 0 days   A & P  Syncope: The patient  presents after a transient loss of consciousness with urinary incontinence and general weakness, had no acute findings on CT head, slightly low serum glucose, no events on pacer interrogation, and normal orthostatic vitals. For this writer the patient stated that his first syncopal episode was on Wednesday. He states that he was eating normal prior to Wednesday. He states that he was unable to eat after Wednesday, because he was "sick". He is unable to tell me what that means other than he was very weak. He does not endorse hypoglycemia as a cause for his syncope. Echocardiogram and Blood cultures have been ordered. Will also check orthostatics. EEG is pending.  Hypoglycemia: The patient reports that prior to his syncopal episode on the last Wednesday he was eating well. He does not endorse hypoglycemia as a cause of his syncope. Glucoses from admitting have been 90, 50, 50, 61, 93. Diet has been liberalized. Monitor glucoses carefully.  CAD: Pt denies chest pain. However, his EKG does demonstrate a new LBBB. Echocardiogram is pending. Continue ASA and statin    ESRD: Will consult nephrology in am. Pt is not volume overloaded, acidotic or hyperkalemic. I believe his usual schedule is T, Th, Sa. Medications will be renally dosed.   DVT prophylaxis: sq heparin   Code Status: DNR  Family Communication: None available Disposition Plan:  Patient is from: Home  Anticipated d/c is to: TBD Anticipated d/c date is: 09/09/20  Karie Kirks, DO [facility] Direct contact: see [email or web address]  7PM-7AM contact night coverage as above 09/08/2020, 1:42 PM  LOS: 0 days

## 2020-09-08 NOTE — ED Provider Notes (Signed)
I assumed care in signout to follow-up on labs.  Patient had syncopal episode was on the floor for a prolonged period of time.  His CK is elevated.  Also has mild hypoglycemia.  Patient reports generalized weakness.  Plan to admit.  Discussed with Dr. Myna Hidalgo for admission   Ripley Fraise, MD 09/08/20 [phone]

## 2020-09-09 ENCOUNTER — Observation Stay (HOSPITAL_BASED_OUTPATIENT_CLINIC_OR_DEPARTMENT_OTHER): Payer: Medicare HMO

## 2020-09-09 ENCOUNTER — Observation Stay (HOSPITAL_COMMUNITY): Payer: Medicare HMO

## 2020-09-09 DIAGNOSIS — I2510 Atherosclerotic heart disease of native coronary artery without angina pectoris: Secondary | ICD-10-CM | POA: Diagnosis not present

## 2020-09-09 DIAGNOSIS — I472 Ventricular tachycardia: Secondary | ICD-10-CM | POA: Diagnosis not present

## 2020-09-09 DIAGNOSIS — E162 Hypoglycemia, unspecified: Secondary | ICD-10-CM | POA: Diagnosis not present

## 2020-09-09 DIAGNOSIS — R55 Syncope and collapse: Secondary | ICD-10-CM

## 2020-09-09 LAB — GLUCOSE, CAPILLARY
Glucose-Capillary: 89 mg/dL (ref 70–99)
Glucose-Capillary: 232 mg/dL — ABNORMAL HIGH (ref 70–99)
Glucose-Capillary: 94 mg/dL (ref 70–99)
Glucose-Capillary: 111 mg/dL — ABNORMAL HIGH (ref 70–99)

## 2020-09-09 LAB — BASIC METABOLIC PANEL
Anion gap: 16 — ABNORMAL HIGH (ref 5–15)
BUN: 84 mg/dL — ABNORMAL HIGH (ref 8–23)
CO2: 25 mmol/L (ref 22–32)
Calcium: 9 mg/dL (ref 8.9–10.3)
Chloride: 93 mmol/L — ABNORMAL LOW (ref 98–111)
Creatinine, Ser: 11.6 mg/dL — ABNORMAL HIGH (ref 0.61–1.24)
GFR, Estimated: 4 mL/min — ABNORMAL LOW (ref >60)
Glucose, Bld: 60 mg/dL — ABNORMAL LOW (ref 70–99)
Potassium: 3.9 mmol/L (ref 3.5–5.1)
Sodium: 134 mmol/L — ABNORMAL LOW (ref 135–145)

## 2020-09-09 LAB — ECHOCARDIOGRAM COMPLETE
Area-P 1/2: 2.62 cm2
Height: 66 in
S' Lateral: 2.7 cm

## 2020-09-09 NOTE — Progress Notes (Signed)
  Echocardiogram 2D Echocardiogram has been performed.  William [name] 09/09/2020, 8:44 AM

## 2020-09-09 NOTE — Progress Notes (Signed)
EEG complete - results pending 

## 2020-09-09 NOTE — Evaluation (Signed)
Physical Therapy Evaluation Patient Details Name: William [name] MRN: [id number] DOB: 23-Dec-1940 Today's Date: 09/09/2020   History of Present Illness  79 y.o. male with medical history significant for ESRD on hemodialysis TTS, diabetes mellitus, hypertension, nonsustained ventricular tachycardia, and CAD, now presenting to emergency department on 11/27after a transient loss of consciousness. Pt reports being down for multiple hours before being found by daughter. EKG demonstrates new LBBB, EEG WFL.  Clinical Impression   Pt presents with generalized weakness, impaired standing balance with history of falls, increased time and effort to mobilize, and decreased activity tolerance. Pt to benefit from acute PT to address deficits. Pt ambulated room distance with RW and close guard for safety, requires cues for form and safety throughout. PT encouraged d/c home with assist of daughter and HHPT, per pt family has been looking into getting him an aide. PT to progress mobility as tolerated, and will continue to follow acutely.      Follow Up Recommendations Home health PT;Supervision for mobility/OOB    Equipment Recommendations  None recommended by PT    Recommendations for Other Services       Precautions / Restrictions Precautions Precautions: Fall Restrictions Weight Bearing Restrictions: No      Mobility  Bed Mobility Overal bed mobility: Needs Assistance Bed Mobility: Supine to Sit     Supine to sit: Supervision     General bed mobility comments: for safety, increased time to perform    Transfers Overall transfer level: Needs assistance Equipment used: Rolling walker (2 wheeled) Transfers: Sit to/from Stand Sit to Stand: Min assist         General transfer comment: min assist to steady, pt walking hands up thighs to reach full standing.  Ambulation/Gait Ambulation/Gait assistance: Min guard Gait Distance (Feet): 25 Feet Assistive device: Rolling walker (2  wheeled) Gait Pattern/deviations: Step-through pattern;Decreased stride length;Shuffle;Trunk flexed Gait velocity: decr   General Gait Details: min guard for safety, verbal cuing for upright posture, placement in rW  Stairs            Wheelchair Mobility    Modified Rankin (Stroke Patients Only)       Balance Overall balance assessment: Needs assistance;History of Falls Sitting-balance support: No upper extremity supported;Feet unsupported Sitting balance-Leahy Scale: Good     Standing balance support: Bilateral upper extremity supported;During functional activity Standing balance-Leahy Scale: Poor Standing balance comment: reliant on external assist                             Pertinent Vitals/Pain Pain Assessment: No/denies pain    Home Living Family/patient expects to be discharged to:: Private residence Living Arrangements: Alone Available Help at Discharge: Family;Available 24 hours/day (will d/c home to daughter's house, below information refers to this) Type of Home: House Home Access: Stairs to enter   Entrance Stairs-Number of Steps: 1 Home Layout: Able to live on main level with bedroom/bathroom Home Equipment: Walker - 2 wheels;Wheelchair - manual;Cane - single point      Prior Function Level of Independence: Needs assistance   Gait / Transfers Assistance Needed: pt reports ambulating with use of cane, has a bunion that limits mobility  ADL's / Homemaking Assistance Needed: Pt reports having assist for meal prep from family and neighbors, does not drive so takes bus to/from HD and daughter takes him to appointments        Hand Dominance   Dominant Hand: Right    Extremity/Trunk Assessment  Upper Extremity Assessment Upper Extremity Assessment: Defer to OT evaluation    Lower Extremity Assessment Lower Extremity Assessment: Generalized weakness    Cervical / Trunk Assessment Cervical / Trunk Assessment: Normal   Communication   Communication: No difficulties  Cognition Arousal/Alertness: Awake/alert Behavior During Therapy: WFL for tasks assessed/performed Overall Cognitive Status: Within Functional Limits for tasks assessed                                 General Comments: talkative and pleasant      General Comments      Exercises     Assessment/Plan    PT Assessment Patient needs continued PT services  PT Problem List Decreased strength;Decreased mobility;Decreased balance;Decreased knowledge of use of DME;Decreased activity tolerance;Decreased safety awareness       PT Treatment Interventions DME instruction;Therapeutic activities;Gait training;Therapeutic exercise;Patient/family education;Balance training;Stair training;Functional mobility training;Neuromuscular re-education    PT Goals (Current goals can be found in the Care Plan section)  Acute Rehab PT Goals Patient Stated Goal: go home with daughter PT Goal Formulation: With patient Time For Goal Achievement: 09/23/20 Potential to Achieve Goals: Good    Frequency Min 3X/week   Barriers to discharge        Co-evaluation               AM-PAC PT "6 Clicks" Mobility  Outcome Measure Help needed turning from your back to your side while in a flat bed without using bedrails?: A Little Help needed moving from lying on your back to sitting on the side of a flat bed without using bedrails?: A Little Help needed moving to and from a bed to a chair (including a wheelchair)?: A Little Help needed standing up from a chair using your arms ([name].g., wheelchair or bedside chair)?: A Little Help needed to walk in hospital room?: A Little Help needed climbing 3-5 steps with a railing? : A Little 6 Click Score: 18    End of Session   Activity Tolerance: Patient tolerated treatment well;Patient limited by fatigue Patient left: in chair;with chair alarm set;with call bell/phone within reach Nurse Communication:  Mobility status PT Visit Diagnosis: Other abnormalities of gait and mobility (R26.89);Difficulty in walking, not elsewhere classified (R26.2)    Time: 0762-2633 PT Time Calculation (min) (ACUTE ONLY): 24 min   Charges:   PT Evaluation $PT Eval Low Complexity: 1 Low PT Treatments $Gait Training: 8-22 mins       William [name], PT Acute Rehabilitation Services Pager [phone]  Office [phone]    Jonathandavid Marlett D Elonda Husky 09/09/2020, 5:53 PM

## 2020-09-09 NOTE — Plan of Care (Signed)

## 2020-09-09 NOTE — Progress Notes (Signed)
PROGRESS NOTE  William [name] IHK:[id number] DOB: September 25, 1941 DOA: 09/07/2020 PCP: Patient, No Pcp Per  Brief History   William [name] is a 79 y.o. male with medical history significant for ESRD on hemodialysis, history of diabetes mellitus, hypertension, nonsustained ventricular tachycardia, and CAD, now presenting to emergency department after a transient loss of consciousness.  The patient reports that he went to dialysis on 09/06/2020, was feeling generally weak and with a vague sense of unwellness, but completed the session and returned home where he remembers entering his house and then waking up on the floor the following morning.  He felt generally weak and had had urinary incontinence, was unable to get up from the floor, and remained there until family came last night.  He denies any recent fevers, chills, chest pain, or palpitations.  He has not noticed any change in his chronic mild ankle swelling and denies any leg tenderness or hemoptysis.  He does not believe that he takes diabetes medications any longer.  ED Course: Upon arrival to the ED, patient is found to be afebrile, saturating well on room air, and with stable blood pressure.  EKG features sinus rhythm with LBBB.  Noncontrast head CT is negative for acute intracranial abnormality.  Chemistry panel features a normal potassium, normal bicarbonate, BUN of 63, and glucose 64.  CBC notable for macrocytosis without anemia.  Serum CK is mildly elevated.  Pacer was interrogated and no events noted per ED physician report.  COVID-19 was negative.  [facility] were consulted to admit the patient.   For this writer the patient stated that his first syncopal episode was on Wednesday. He states that he was eating normal prior to Wednesday. He states that he was unable to eat after Wednesday, because he was "sick". He is unable to tell me what that means other than he was very weak. He does not endorse hypoglycemia as a cause for his  syncope.  EEG is complete and is negative for seizures. Echocardiogram is also within normal limits. Orthostatic vitals are pending. PT/OT has been ordered.  Consultants  . None  Procedures  . EEG  Antibiotics   Anti-infectives (From admission, onward)   None     Subjective  The patient is resting quietly. No new complaints.   Objective   Vitals:  Vitals:   09/09/20 1201 09/09/20 1203  BP: 135/76 132/72  Pulse: 77 82  Resp: 15 15  Temp:    SpO2: 99% 100%   Exam:  Constitutional:  . The patient is awake, alert, and oriented x 3. No acute distress. Respiratory:  . No increased work of breathing. . No wheezes, rales, or rhonchi . No tactile fremitus Cardiovascular:  . Regular rate and rhythm . No murmurs, ectopy, or gallups. . No lateral PMI. No thrills. Abdomen:  . Abdomen is soft, non-tender, non-distended . No hernias, masses, or organomegaly . Normoactive bowel sounds.  Musculoskeletal:  . No cyanosis, clubbing, or edema Skin:  . No rashes, lesions, ulcers . palpation of skin: no induration or nodules Neurologic:  . CN 2-12 intact . Sensation all 4 extremities intact Psychiatric:  . Mental status o Mood, affect appropriate o Orientation to person, place, time  . judgment and insight appear intact  I have personally reviewed the following:   Today's Data  . Vitals, CBC, BMP  Micro Data  . Blood culture  Imaging  . CT head  Cardiology Data  . EKG: Sinus rhythm with LBBB which is new since  previous EKG in 11/09/2019.  Scheduled Meds: . aspirin EC  81 mg Oral Daily  . heparin  5,000 Units Subcutaneous Q8H  . insulin aspart  0-6 Units Subcutaneous TID WC  . rosuvastatin  10 mg Oral Daily  . sodium chloride flush  3 mL Intravenous Q12H   Continuous Infusions: . sodium chloride      Principal Problem:   Transient loss of consciousness Active Problems:   Ventricular tachycardia, non-sustained (HCC)   CAD (coronary artery disease)    LOS: 0 days   A & P  Syncope: The patient presents after a transient loss of consciousness with urinary incontinence and general weakness, had no acute findings on CT head, slightly low serum glucose, no events on pacer interrogation, and normal orthostatic vitals. For this writer the patient stated that his first syncopal episode was on Wednesday. He states that he was eating normal prior to Wednesday. He states that he was unable to eat after Wednesday, because he was "sick". He is unable to tell me what that means other than he was very weak. He does not endorse hypoglycemia as a cause for his syncope. Echocardiogram is within normal limits. Orthostatics are pending. EEG has been read and is negative for seizures. Blood cultures x 2 have had no growth.   Hypoglycemia: The patient reports that prior to his syncopal episode on the last Wednesday he was eating well. He does not endorse hypoglycemia as a cause of his syncope. Glucoses from admitting have been 90, 50, 50, 61, 93. Diet has been liberalized.  There has been no hypoglycemia in the last 24 hours. Monitor glucoses carefully.  CAD: Pt denies chest pain. However, his EKG does demonstrate a new LBBB. Echocardiogram is pending. Continue ASA and statin    ESRD: Will consult nephrology in am. Pt is not volume overloaded, acidotic or hyperkalemic. I believe his usual schedule is T, Th, Sa. Medications will be renally dosed.   DVT prophylaxis: sq heparin   Code Status: DNR  Family Communication: None available Disposition Plan:  Patient is from: Home  Anticipated d/c is to: TBD Anticipated d/c date is: 09/09/20  Karie Kirks, DO [facility] Direct contact: see [email or web address]  7PM-7AM contact night coverage as above 09/09/2020, 2:07 PM  LOS: 0 days

## 2020-09-09 NOTE — Care Management Obs Status (Signed)
[facility] NOTIFICATION   Patient Details  Name: William [name] MRN: [id number] Date of Birth: [birth year]   Medicare Observation Status Notification Given:  Yes  Pt also asked that CSW speak with his daughter [name] and explain the same notice to her, which was done.    Joanne Chars, LCSW 09/09/2020, 1:09 PM

## 2020-09-09 NOTE — Procedures (Signed)
Patient Name: William [name]  MRN: [id number]  Epilepsy Attending: Lora Havens  Referring Physician/Provider: Dr. Mitzi Hansen Date: 09/09/2020 Duration: 25.26 minutes  Patient history: 79 year old male with transient loss of consciousness.  EEG to evaluate for seizures.  Level of alertness: Awake, asleep  AEDs during EEG study: None  Technical aspects: This EEG study was done with scalp electrodes positioned according to the 10-20 International system of electrode placement. Electrical activity was acquired at a sampling rate of 500Hz  and reviewed with a high frequency filter of 70Hz  and a low frequency filter of 1Hz . EEG data were recorded continuously and digitally stored.   Description: The posterior dominant rhythm consists of 8 Hz activity of moderate voltage (25-35 uV) seen predominantly in posterior head regions, symmetric and reactive to eye opening and eye closing. Sleep was characterized by vertex waves, sleep spindles (12 to 14 Hz), maximal frontocentral region. Hyperventilation and photic stimulation were not performed.     IMPRESSION: This study is within normal limits. No seizures or epileptiform discharges were seen throughout the recording.  Paisli Silfies Barbra Sarks

## 2020-09-10 ENCOUNTER — Observation Stay (HOSPITAL_COMMUNITY): Payer: Medicare HMO

## 2020-09-10 DIAGNOSIS — I132 Hypertensive heart and chronic kidney disease with heart failure and with stage 5 chronic kidney disease, or end stage renal disease: Secondary | ICD-10-CM | POA: Diagnosis not present

## 2020-09-10 DIAGNOSIS — I472 Ventricular tachycardia: Secondary | ICD-10-CM | POA: Diagnosis not present

## 2020-09-10 DIAGNOSIS — I5030 Unspecified diastolic (congestive) heart failure: Secondary | ICD-10-CM | POA: Diagnosis not present

## 2020-09-10 DIAGNOSIS — R55 Syncope and collapse: Secondary | ICD-10-CM | POA: Diagnosis not present

## 2020-09-10 DIAGNOSIS — M25752 Osteophyte, left hip: Secondary | ICD-10-CM | POA: Diagnosis not present

## 2020-09-10 DIAGNOSIS — E1129 Type 2 diabetes mellitus with other diabetic kidney complication: Secondary | ICD-10-CM | POA: Diagnosis not present

## 2020-09-10 DIAGNOSIS — Z992 Dependence on renal dialysis: Secondary | ICD-10-CM | POA: Diagnosis not present

## 2020-09-10 DIAGNOSIS — I129 Hypertensive chronic kidney disease with stage 1 through stage 4 chronic kidney disease, or unspecified chronic kidney disease: Secondary | ICD-10-CM | POA: Diagnosis not present

## 2020-09-10 DIAGNOSIS — Z043 Encounter for examination and observation following other accident: Secondary | ICD-10-CM | POA: Diagnosis not present

## 2020-09-10 DIAGNOSIS — D631 Anemia in chronic kidney disease: Secondary | ICD-10-CM | POA: Diagnosis not present

## 2020-09-10 DIAGNOSIS — I2510 Atherosclerotic heart disease of native coronary artery without angina pectoris: Secondary | ICD-10-CM | POA: Diagnosis not present

## 2020-09-10 DIAGNOSIS — E162 Hypoglycemia, unspecified: Secondary | ICD-10-CM | POA: Diagnosis not present

## 2020-09-10 DIAGNOSIS — E11649 Type 2 diabetes mellitus with hypoglycemia without coma: Secondary | ICD-10-CM | POA: Diagnosis not present

## 2020-09-10 DIAGNOSIS — N186 End stage renal disease: Secondary | ICD-10-CM | POA: Diagnosis not present

## 2020-09-10 LAB — GLUCOSE, CAPILLARY
Glucose-Capillary: 94 mg/dL (ref 70–99)
Glucose-Capillary: 140 mg/dL — ABNORMAL HIGH (ref 70–99)
Glucose-Capillary: 72 mg/dL (ref 70–99)

## 2020-09-10 LAB — BASIC METABOLIC PANEL
Anion gap: 18 — ABNORMAL HIGH (ref 5–15)
BUN: 89 mg/dL — ABNORMAL HIGH (ref 8–23)
CO2: 21 mmol/L — ABNORMAL LOW (ref 22–32)
Calcium: 8.7 mg/dL — ABNORMAL LOW (ref 8.9–10.3)
Chloride: 93 mmol/L — ABNORMAL LOW (ref 98–111)
Creatinine, Ser: 11.77 mg/dL — ABNORMAL HIGH (ref 0.61–1.24)
GFR, Estimated: 4 mL/min — ABNORMAL LOW (ref >60)
Glucose, Bld: 115 mg/dL — ABNORMAL HIGH (ref 70–99)
Potassium: 4.5 mmol/L (ref 3.5–5.1)
Sodium: 132 mmol/L — ABNORMAL LOW (ref 135–145)

## 2020-09-10 LAB — SULFONYLUREA HYPOGLYCEMICS PANEL, SERUM

## 2020-09-10 LAB — C-PEPTIDE

## 2020-09-10 MED ORDER — CHLORHEXIDINE GLUCONATE CLOTH 2 % EX PADS
6.0000 | MEDICATED_PAD | Freq: Every day | CUTANEOUS | Status: DC
  Administered 2020-09-10: 6 via TOPICAL

## 2020-09-10 MED ORDER — CALCITRIOL 0.25 MCG PO CAPS: 1.2500 ug | ORAL_CAPSULE | ORAL | 0 refills | Status: AC

## 2020-09-10 MED ORDER — CALCITRIOL 0.25 MCG PO CAPS
1.2500 ug | ORAL_CAPSULE | ORAL | Status: DC
  Administered 2020-09-10: 1.25 ug via ORAL
  Filled 2020-09-10: qty 5

## 2020-09-10 NOTE — Evaluation (Signed)
Occupational Therapy Evaluation Patient Details Name: William [name] MRN: [id number] DOB: April 10, 1941 Today's Date: 09/10/2020    History of Present Illness 79 y.o. male with medical history significant for ESRD on hemodialysis TTS, diabetes mellitus, hypertension, nonsustained ventricular tachycardia, and CAD, now presenting to emergency department on 11/27after a transient loss of consciousness. Pt reports being down for multiple hours before being found by daughter. EKG demonstrates new LBBB, EEG WFL.   Clinical Impression   Pt admitted with the above diagnoses and presents with below problem list. Pt will benefit from continued acute OT to address the below listed deficits and maximize independence with basic ADLs prior to d/c to daughter's house. PTA pt was mod I with ADLs, family assists with meal prep and transportation. Pt currently setup with UB ADLs, up to min A with LB ADLs and functional transfers (ie toilet), min guard for household distance mobility utilizing rw.      Follow Up Recommendations  Home health OT;Supervision - Intermittent (OOB/mobility)    Equipment Recommendations  None recommended by OT    Recommendations for Other Services       Precautions / Restrictions Precautions Precautions: Fall Restrictions Weight Bearing Restrictions: No      Mobility Bed Mobility Overal bed mobility: Needs Assistance Bed Mobility: Supine to Sit;Sit to Supine     Supine to sit: Supervision Sit to supine: Supervision   General bed mobility comments: for safety, increased time to perform    Transfers Overall transfer level: Needs assistance Equipment used: Rolling walker (2 wheeled) Transfers: Sit to/from Stand Sit to Stand: Min guard;Min assist         General transfer comment: min guard for safety. extra time and unsteadiness noted but pt able to complete with light steading assist. Utilized rw.     Balance Overall balance assessment: Needs  assistance;History of Falls Sitting-balance support: No upper extremity supported;Feet unsupported Sitting balance-Leahy Scale: Good     Standing balance support: Bilateral upper extremity supported;During functional activity Standing balance-Leahy Scale: Poor Standing balance comment: reliant on external assist                           ADL either performed or assessed with clinical judgement   ADL Overall ADL's : Needs assistance/impaired Eating/Feeding: Set up;Sitting   Grooming: Set up;Sitting   Upper Body Bathing: Set up;Sitting   Lower Body Bathing: Min guard;Sit to/from stand;Minimal assistance   Upper Body Dressing : Set up;Sitting   Lower Body Dressing: Min guard;Sit to/from stand;Minimal assistance   Toilet Transfer: Min guard;Ambulation;RW;Minimal assistance   Toileting- Water quality scientist and Hygiene: Min guard;Sit to/from stand;Sitting/lateral lean;Minimal assistance     Tub/Shower Transfer Details (indicate cue type and reason): n/a Functional mobility during ADLs: Min guard;Rolling walker General ADL Comments: Pt walked household distance utilizing rw for balance and to offload from painful L foot.  Extra time and effort with movements, light steadying assist to come to standing. Min guard for safety with ambulation utilizing rw     Vision         Perception     Praxis      Pertinent Vitals/Pain Pain Assessment: No/denies pain     Hand Dominance Right   Extremity/Trunk Assessment Upper Extremity Assessment Upper Extremity Assessment: Generalized weakness   Lower Extremity Assessment Lower Extremity Assessment: Defer to PT evaluation       Communication Communication Communication: No difficulties   Cognition Arousal/Alertness: Awake/alert Behavior During Therapy: [facility] for  tasks assessed/performed Overall Cognitive Status: Within Functional Limits for tasks assessed                                 General  Comments: talkative and pleasant   General Comments       Exercises     Shoulder Instructions      Home Living Family/patient expects to be discharged to:: Private residence Living Arrangements: Alone Available Help at Discharge: Family;Available 24 hours/day (will d/c home to daughter's house, below information refers ) Type of Home: House Home Access: Stairs to enter Entrance Stairs-Number of Steps: 1   Home Layout: Able to live on main level with bedroom/bathroom     Bathroom Shower/Tub: Occupational psychologist: Standard     Home Equipment: Environmental consultant - 2 wheels;Wheelchair - manual;Cane - single point   Additional Comments: sponge bathes at baseline      Prior Functioning/Environment Level of Independence: Needs assistance  Gait / Transfers Assistance Needed: pt reports ambulating with use of cane, has a bunion that limits mobility ADL's / Homemaking Assistance Needed: Pt reports having assist for meal prep from family and neighbors, does not drive so takes bus to/from HD and daughter takes him to appointments            OT Problem List: Impaired balance (sitting and/or standing);Decreased activity tolerance;Decreased knowledge of use of DME or AE;Decreased knowledge of precautions;Pain      OT Treatment/Interventions: Self-care/ADL training;Therapeutic exercise;Energy conservation;DME and/or AE instruction;Therapeutic activities;Patient/family education;Balance training    OT Goals(Current goals can be found in the care plan section) Acute Rehab OT Goals Patient Stated Goal: go home with daughter OT Goal Formulation: With patient Time For Goal Achievement: 09/24/20 Potential to Achieve Goals: Good ADL Goals Pt Will Perform Lower Body Bathing: with modified independence;sit to/from stand Pt Will Perform Lower Body Dressing: with modified independence;sit to/from stand Pt Will Transfer to Toilet: with modified independence;ambulating Pt Will Perform  Toileting - Clothing Manipulation and hygiene: with modified independence;sit to/from stand  OT Frequency: Min 2X/week   Barriers to D/C:            Co-evaluation              AM-PAC OT "6 Clicks" Daily Activity     Outcome Measure Help from another person eating meals?: None Help from another person taking care of personal grooming?: None Help from another person toileting, which includes using toliet, bedpan, or urinal?: A Little Help from another person bathing (including washing, rinsing, drying)?: A Little Help from another person to put on and taking off regular upper body clothing?: None Help from another person to put on and taking off regular lower body clothing?: A Little 6 Click Score: 21   End of Session Equipment Utilized During Treatment: Rolling walker  Activity Tolerance: Patient tolerated treatment well Patient left: in bed;with call bell/phone within reach;with bed alarm set  OT Visit Diagnosis: Unsteadiness on feet (R26.81);Muscle weakness (generalized) (M62.81);Pain;History of falling (Z91.81)                Time: 6301-6010 OT Time Calculation (min): 22 min Charges:  OT General Charges $OT Visit: 1 Visit OT Evaluation $OT Eval Low Complexity: [place], OT Acute Rehabilitation Services Pager: [phone] Office: [phone]   Hortencia Pilar 09/10/2020, 12:38 PM

## 2020-09-10 NOTE — Discharge Summary (Signed)
Physician Discharge Summary  William [name]:[id number] DOB: [birth year] DOA: 09/07/2020  PCP: Patient, No Pcp Per  Admit date: 09/07/2020 Discharge date: 09/10/2020  Recommendations for Outpatient Follow-up:  1. Discharge to home. 2. Keep previously scheduled dialysis appointments. 3. Follow up with PCP in 7-10 days. Have chemistry checked at that visit. 4. Follow up with nephrology as directed. 5. Check glucoses if feeling lightheaded, anxious, or irritable. Drink a glass of milk if less than 70.   Follow-up Information    [facility], [facility] Follow up.   Specialty: Home Health Services Why: [facility] will contact you within 24-48 hours to schedule your first appointment.  Contact information: [address] [place] [address] [phone]              Discharge Diagnoses: Principal diagnosis is #1 1. Syncope 2. Hypoglycemia 3. ESRD on HD 4. CAD  Discharge Condition: Fair  Disposition: Home with home health PT/OT  Diet recommendation: heart healthy  History of present illness:  William [name] is a 79 y.o. male with medical history significant for ESRD on hemodialysis, history of diabetes mellitus, hypertension, nonsustained ventricular tachycardia, and CAD, now presenting to emergency department after a transient loss of consciousness.  The patient reports that he went to dialysis on 09/06/2020, was feeling generally weak and with a vague sense of unwellness, but completed the session and returned home where he remembers entering his house and then waking up on the floor the following morning.  He felt generally weak and had had urinary incontinence, was unable to get up from the floor, and remained there until family came last night.  He denies any recent fevers, chills, chest pain, or palpitations.  He has not noticed any change in his chronic mild ankle swelling and denies any leg tenderness or hemoptysis.  He does not believe that he takes diabetes  medications any longer.  ED Course: Upon arrival to the ED, patient is found to be afebrile, saturating well on room air, and with stable blood pressure.  EKG features sinus rhythm with LBBB.  Noncontrast head CT is negative for acute intracranial abnormality.  Chemistry panel features a normal potassium, normal bicarbonate, BUN of 63, and glucose 64.  CBC notable for macrocytosis without anemia.  Serum CK is mildly elevated.  Pacer was interrogated and no events noted per ED physician report.  COVID-19 screening test was negative.  Hospital Course: [facility] were consulted to admit the patient.   For this writer the patient stated that his first syncopal episode was on Wednesday. He states that he was eating normal prior to Wednesday. He states that he was unable to eat after Wednesday, because he was "sick". He is unable to tell me what that means other than he was very weak. He does not endorse hypoglycemia as a cause for his syncope.  With a change in diet the patient has had no episodes of hypglycemia in the last 24 hours. He has also had no further syncopal episodes. Orthostatic vitals have been negative.   EEG is complete and is negative for seizures. Echocardiogram is also within normal limits. Orthostatic vitals are pending. PT/OT has evaluated the patient. They have recommended that the patient be discharged to home with home health PT/OT. This has been ordered.  Today's assessment: S: The patient is resting comfortably. No new complaints. O: Vitals:  Vitals:   09/10/20 1411 09/10/20 1440  BP:  131/61  Pulse:  67  Resp:  18  Temp:  98.1 F (36.7 C)  SpO2: 99% 100%   Exam:  Constitutional:  . The patient is awake, alert, and oriented x 3. No acute distress. Respiratory:  . No increased work of breathing. . No wheezes, rales, or rhonchi . No tactile fremitus Cardiovascular:  . Regular rate and rhythm . No murmurs, ectopy, or gallups. . No lateral PMI. No  thrills. Abdomen:  . Abdomen is soft, non-tender, non-distended . No hernias, masses, or organomegaly . Normoactive bowel sounds.  Musculoskeletal:  . No cyanosis, clubbing, or edema Skin:  . No rashes, lesions, ulcers . palpation of skin: no induration or nodules Neurologic:  . CN 2-12 intact . Sensation all 4 extremities intact Psychiatric:  . Mental status o Mood, affect appropriate o Orientation to person, place, time  . judgment and insight appear intact  Discharge Instructions  Discharge Instructions    Activity as tolerated - No restrictions   Complete by: As directed    Call MD for:   Complete by: As directed    Further passing out episodes or falls.   Call MD for:  difficulty breathing, headache or visual disturbances   Complete by: As directed    Call MD for:  persistant dizziness or light-headedness   Complete by: As directed    Call MD for:  temperature >100.4   Complete by: As directed    Diet - low sodium heart healthy   Complete by: As directed    Discharge instructions   Complete by: As directed    Discharge to home. Keep previously scheduled dialysis appointments. Follow up with PCP in 7-10 days. Have chemistry checked at that visit. Follow up with nephrology as directed. Check glucoses if feeling lightheaded, anxious, or irritable. Drink a glass of milk if less than 70.   Increase activity slowly   Complete by: As directed      Allergies as of 09/10/2020      Reactions   Carvedilol Other (See Comments)   Makes him feel like his pelvis is "grinding" (??) Patient doesn't recall this, however      Medication List    TAKE these medications   amiodarone 200 MG tablet Commonly known as: PACERONE TAKE 1 TABLET BY MOUTH ON MONDAY, WEDNESDAY AND FRIDAY (3 TIMES A WEEK) What changed: See the new instructions.   aspirin 81 MG chewable tablet Chew 1 tablet (81 mg total) by mouth daily.   calcitRIOL 0.25 MCG capsule Commonly known as:  ROCALTROL Take 5 capsules (1.25 mcg total) by mouth every Monday, Wednesday, and Friday with hemodialysis. Start taking on: September 11, 2020   pantoprazole 20 MG tablet Commonly known as: PROTONIX Take 20 mg by mouth daily.   polyethylene glycol 17 g packet Commonly known as: MIRALAX / GLYCOLAX Take 17 g by mouth daily.   Renal-Vite 0.8 MG Tabs Take 1 tablet by mouth at bedtime.   rosuvastatin 10 MG tablet Commonly known as: CRESTOR Take 1 tablet (10 mg total) by mouth daily at 6 PM.   vitamin C 1000 MG tablet Take 1 tablet by mouth daily.      Allergies  Allergen Reactions  . Carvedilol Other (See Comments)    Makes him feel like his pelvis is "grinding" (??) Patient doesn't recall this, however    The results of significant diagnostics from this hospitalization (including imaging, microbiology, ancillary and laboratory) are listed below for reference.    Significant Diagnostic Studies: CT Head Wo Contrast  Result Date: 09/07/2020 CLINICAL DATA:  Status post trauma. EXAM: CT HEAD WITHOUT CONTRAST TECHNIQUE: Contiguous axial images were obtained from the base of the skull through the vertex without intravenous contrast. COMPARISON:  October 04, 2018 FINDINGS: Brain: There is mild cerebral atrophy with widening of the extra-axial spaces and ventricular dilatation. There are areas of decreased attenuation within the white matter tracts of the supratentorial brain, consistent with microvascular disease changes. Vascular: No hyperdense vessel or unexpected calcification. Skull: A right parietal craniotomy defect is seen. Sinuses/Orbits: There is marked severity right-sided sphenoid sinus, right ethmoid sinus and right maxillary sinus mucosal thickening. A stable 2.4 cm x 0.9 cm nasal mucosal polyp is seen on the right. A stable similar appearing 2.0 cm x 1.2 cm nasal mucosal polyp is seen on the left. Other: None. IMPRESSION: 1. Status post right parietal craniotomy. 2. No acute  intracranial abnormality. 3. Stable bilateral nasal mucosal polyps. 4. Right-sided sphenoid sinus, right ethmoid sinus and right maxillary sinus disease. Electronically Signed   By: Virgina Norfolk M.D.   On: 09/07/2020 22:01   EEG adult  Result Date: 09/09/2020 Lora Havens, MD     09/09/2020 12:31 PM Patient Name: William [name] MRN: [id number] Epilepsy Attending: Lora Havens Referring Physician/Provider: Dr. Mitzi Hansen Date: 09/09/2020 Duration: 25.26 minutes Patient history: 79 year old male with transient loss of consciousness.  EEG to evaluate for seizures. Level of alertness: Awake, asleep AEDs during EEG study: None Technical aspects: This EEG study was done with scalp electrodes positioned according to the 10-20 International system of electrode placement. Electrical activity was acquired at a sampling rate of 500Hz  and reviewed with a high frequency filter of 70Hz  and a low frequency filter of 1Hz . EEG data were recorded continuously and digitally stored. Description: The posterior dominant rhythm consists of 8 Hz activity of moderate voltage (25-35 uV) seen predominantly in posterior head regions, symmetric and reactive to eye opening and eye closing. Sleep was characterized by vertex waves, sleep spindles (12 to 14 Hz), maximal frontocentral region. Hyperventilation and photic stimulation were not performed.   IMPRESSION: This study is within normal limits. No seizures or epileptiform discharges were seen throughout the recording. Lora Havens   ECHOCARDIOGRAM COMPLETE  Result Date: 09/09/2020    ECHOCARDIOGRAM REPORT   Patient Name:   William [name] Date of Exam: 09/09/2020 Medical Rec #:  [id number]         Height:       66.0 in Accession #:    [id number]        Weight:       130.1 lb Date of Birth:  1941-07-03         BSA:          1.666 m Patient Age:    70 years          BP:           153/77 mmHg Patient Gender: M                 HR:           60 bpm. Exam Location:   Inpatient Procedure: 2D Echo, Cardiac Doppler and Color Doppler Indications:    Syncope  History:        Patient has prior history of Echocardiogram examinations, most                 recent 10/05/2018. CAD, Arrythmias:Vtach,                 Signs/Symptoms:Syncope;  Risk Factors:Hypertension, Diabetes and                 Dyslipidemia. ESRD.  Sonographer:    Dustin Flock Referring Phys: [phone] Bj Morlock IMPRESSIONS  1. Left ventricular ejection fraction, by estimation, is 60 to 65%. The left ventricle has normal function. The left ventricle has no regional wall motion abnormalities. There is mild left ventricular hypertrophy. Left ventricular diastolic parameters are consistent with Grade I diastolic dysfunction (impaired relaxation).  2. Right ventricular systolic function is normal. The right ventricular size is normal. There is normal pulmonary artery systolic pressure. The estimated right ventricular systolic pressure is 84.1 mmHg.  3. A small pericardial effusion is present.  4. The mitral valve is normal in structure. No evidence of mitral valve regurgitation. No evidence of mitral stenosis.  5. The aortic valve was not well visualized. Aortic valve regurgitation is not visualized. No aortic stenosis is present.  6. The inferior vena cava is normal in size with greater than 50% respiratory variability, suggesting right atrial pressure of 3 mmHg. FINDINGS  Left Ventricle: Left ventricular ejection fraction, by estimation, is 60 to 65%. The left ventricle has normal function. The left ventricle has no regional wall motion abnormalities. The left ventricular internal cavity size was normal in size. There is  mild left ventricular hypertrophy. Left ventricular diastolic parameters are consistent with Grade I diastolic dysfunction (impaired relaxation). Right Ventricle: The right ventricular size is normal. Right vetricular wall thickness was not assessed. Right ventricular systolic function is normal. There is  normal pulmonary artery systolic pressure. The tricuspid regurgitant velocity is 2.23 m/s, and with an assumed right atrial pressure of 3 mmHg, the estimated right ventricular systolic pressure is 32.4 mmHg. Left Atrium: Left atrial size was normal in size. Right Atrium: Right atrial size was normal in size. Pericardium: A small pericardial effusion is present. Mitral Valve: The mitral valve is normal in structure. No evidence of mitral valve regurgitation. No evidence of mitral valve stenosis. Tricuspid Valve: The tricuspid valve is normal in structure. Tricuspid valve regurgitation is trivial. Aortic Valve: The aortic valve was not well visualized. Aortic valve regurgitation is not visualized. No aortic stenosis is present. Pulmonic Valve: The pulmonic valve was not well visualized. Pulmonic valve regurgitation is not visualized. Aorta: The aortic root is normal in size and structure. Venous: The inferior vena cava is normal in size with greater than 50% respiratory variability, suggesting right atrial pressure of 3 mmHg. IAS/Shunts: The interatrial septum was not well visualized.  LEFT VENTRICLE PLAX 2D LVIDd:         4.00 cm  Diastology LVIDs:         2.70 cm  LV e' medial:    4.35 cm/s LV PW:         1.40 cm  LV E/e' medial:  15.7 LV IVS:        1.40 cm  LV e' lateral:   5.98 cm/s LVOT diam:     2.00 cm  LV E/e' lateral: 11.4 LV SV:         54 LV SV Index:   32 LVOT Area:     3.14 cm  RIGHT VENTRICLE RV Basal diam:  3.10 cm RV S prime:     5.98 cm/s TAPSE (M-mode): 2.3 cm LEFT ATRIUM             Index       RIGHT ATRIUM           Index  LA diam:        3.60 cm 2.16 cm/m  RA Area:     11.80 cm LA Vol (A2C):   20.0 ml 12.01 ml/m RA Volume:   30.30 ml  18.19 ml/m LA Vol (A4C):   34.6 ml 20.77 ml/m LA Biplane Vol: 29.2 ml 17.53 ml/m  AORTIC VALVE LVOT Vmax:   82.90 cm/s LVOT Vmean:  56.700 cm/s LVOT VTI:    0.171 m  AORTA Ao Root diam: 2.70 cm MITRAL VALVE               TRICUSPID VALVE MV Area (PHT): 2.62 cm     TR Peak grad:   19.9 mmHg MV Decel Time: 289 msec    TR Vmax:        223.00 cm/s MV E velocity: 68.10 cm/s MV A velocity: 89.10 cm/s  SHUNTS MV E/A ratio:  0.76        Systemic VTI:  0.17 m                            Systemic Diam: 2.00 cm Oswaldo Milian MD Electronically signed by Oswaldo Milian MD Signature Date/Time: 09/09/2020/11:19:38 AM    Final     Microbiology: Recent Results (from the past 240 hour(s))  Resp Panel by RT-PCR (Flu A&B, Covid) Nasopharyngeal Swab     Status: None   Collection Time: 09/08/20  1:02 AM   Specimen: Nasopharyngeal Swab; Nasopharyngeal(NP) swabs in vial transport medium  Result Value Ref Range Status   SARS Coronavirus 2 by RT PCR NEGATIVE NEGATIVE Final    Comment: (NOTE) SARS-CoV-2 target nucleic acids are NOT DETECTED.  The SARS-CoV-2 RNA is generally detectable in upper respiratory specimens during the acute phase of infection. The lowest concentration of SARS-CoV-2 viral copies this assay can detect is 138 copies/mL. A negative result does not preclude SARS-Cov-2 infection and should not be used as the sole basis for treatment or other patient management decisions. A negative result may occur with  improper specimen collection/handling, submission of specimen other than nasopharyngeal swab, presence of viral mutation(s) within the areas targeted by this assay, and inadequate number of viral copies(<138 copies/mL). A negative result must be combined with clinical observations, patient history, and epidemiological information. The expected result is Negative.  Fact Sheet for Patients:  EntrepreneurPulse.com.au  Fact Sheet for Healthcare Providers:  IncredibleEmployment.be  This test is no t yet approved or cleared by the [place] FDA and  has been authorized for detection and/or diagnosis of SARS-CoV-2 by FDA under an Emergency Use Authorization (EUA). This EUA will remain  in effect  (meaning this test can be used) for the duration of the COVID-19 declaration under Section 564(b)(1) of the Act, 21 U.S.C.section 360bbb-3(b)(1), unless the authorization is terminated  or revoked sooner.       Influenza A by PCR NEGATIVE NEGATIVE Final   Influenza B by PCR NEGATIVE NEGATIVE Final    Comment: (NOTE) The Xpert Xpress SARS-CoV-2/FLU/RSV plus assay is intended as an aid in the diagnosis of influenza from Nasopharyngeal swab specimens and should not be used as a sole basis for treatment. Nasal washings and aspirates are unacceptable for Xpert Xpress SARS-CoV-2/FLU/RSV testing.  Fact Sheet for Patients: EntrepreneurPulse.com.au  Fact Sheet for Healthcare Providers: IncredibleEmployment.be  This test is not yet approved or cleared by the [place] FDA and has been authorized for detection and/or diagnosis of SARS-CoV-2 by FDA under an Emergency Use Authorization (  EUA). This EUA will remain in effect (meaning this test can be used) for the duration of the COVID-19 declaration under Section 564(b)(1) of the Act, 21 U.S.C. section 360bbb-3(b)(1), unless the authorization is terminated or revoked.  Performed at [facility], [place] [address] [place], [place] [address]   Culture, blood (routine x 2)     Status: None (Preliminary result)   Collection Time: 09/08/20  1:37 PM   Specimen: BLOOD  Result Value Ref Range Status   Specimen Description BLOOD LEFT ANTECUBITAL  Final   Special Requests   Final    BOTTLES DRAWN AEROBIC AND ANAEROBIC Blood Culture results may not be optimal due to an excessive volume of blood received in culture bottles   Culture   Final    NO GROWTH 2 DAYS Performed at [facility], [place] [address] [place], [place] [address]    Report Status PENDING  Incomplete  Culture, blood (routine x 2)     Status: None (Preliminary result)   Collection Time: 09/08/20  1:37 PM   Specimen: BLOOD  Result  Value Ref Range Status   Specimen Description BLOOD RIGHT ANTECUBITAL  Final   Special Requests   Final    BOTTLES DRAWN AEROBIC AND ANAEROBIC Blood Culture adequate volume   Culture   Final    NO GROWTH 2 DAYS Performed at [facility], [place] [address], [place], [place] [address]    Report Status PENDING  Incomplete     Labs: Basic Metabolic Panel: Recent Labs  Lab 09/07/20 2344 09/08/20 0358 09/09/20 1327 09/10/20 0451  NA 136 136 134* 132*  K 4.4 4.3 3.9 4.5  CL 96* 94* 93* 93*  CO2 22 23 25  21*  GLUCOSE 64* 108* 60* 115*  BUN 63* 67* 84* 89*  CREATININE 9.28* 9.68* 11.60* 11.77*  CALCIUM 9.2 9.4 9.0 8.7*   Liver Function Tests: Recent Labs  Lab 09/07/20 2344  AST 47*  ALT 22  ALKPHOS 60  BILITOT 1.1  PROT 7.0  ALBUMIN 3.0*   No results for input(s): LIPASE, AMYLASE in the last 168 hours. No results for input(s): AMMONIA in the last 168 hours. CBC: Recent Labs  Lab 09/07/20 2206 09/08/20 0358  WBC 10.1 9.5  NEUTROABS 8.0*  --   HGB 13.4 12.7*  HCT 42.7 39.7  MCV 104.7* 101.5*  PLT 219 239   Cardiac Enzymes: Recent Labs  Lab 09/07/20 2344  CKTOTAL 1,252*   BNP: BNP (last 3 results) No results for input(s): BNP in the last 8760 hours.  ProBNP (last 3 results) No results for input(s): PROBNP in the last 8760 hours.  CBG: Recent Labs  Lab 09/09/20 1155 09/09/20 1549 09/09/20 2108 09/10/20 0751 09/10/20 1142  GLUCAP 232* 94 111* 94 140*    Principal Problem:   Transient loss of consciousness Active Problems:   Ventricular tachycardia, non-sustained (HCC)   CAD (coronary artery disease)   Time coordinating discharge: 38 minutes.  Signed:        Omarion Minnehan, DO [facility]  09/10/2020, 3:43 PM

## 2020-09-10 NOTE — Plan of Care (Signed)
Patient remains free from falls. Safety precautions maintained. 
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
## 2020-09-10 NOTE — Progress Notes (Signed)
Physical Therapy Treatment Patient Details Name: William [name] MRN: [id number] DOB: 1941/02/24 Today's Date: 09/10/2020    History of Present Illness 79 y.o. male with medical history significant for ESRD on hemodialysis TTS, diabetes mellitus, hypertension, nonsustained ventricular tachycardia, and CAD, now presenting to emergency department on 11/27after a transient loss of consciousness. Pt reports being down for multiple hours before being found by daughter. EKG demonstrates new LBBB, EEG WFL.    PT Comments    Pt is progressing well with mobility, he tolerated an increased ambulation distance of 300' with RW, SaO2 99% on room air, no loss of balance.   Follow Up Recommendations  Home health PT;Supervision for mobility/OOB     Equipment Recommendations  None recommended by PT    Recommendations for Other Services       Precautions / Restrictions Precautions Precautions: Fall Restrictions Weight Bearing Restrictions: No    Mobility  Bed Mobility Overal bed mobility: Needs Assistance Bed Mobility: Supine to Sit;Sit to Supine     Supine to sit: Supervision Sit to supine: Supervision   General bed mobility comments: sitting at edge of bed  Transfers Overall transfer level: Needs assistance Equipment used: Rolling walker (2 wheeled) Transfers: Sit to/from Stand Sit to Stand: Min guard         General transfer comment: VCs for hand placement  Ambulation/Gait Ambulation/Gait assistance: Supervision Gait Distance (Feet): 300 Feet Assistive device: Rolling walker (2 wheeled) Gait Pattern/deviations: Step-through pattern;Decreased step length - right;Decreased step length - left Gait velocity: decr   General Gait Details: VCs for posture and positioning in RW, no loss of balance, SaO2 97-99% on RA   Stairs             Wheelchair Mobility    Modified Rankin (Stroke Patients Only)       Balance Overall balance assessment: Needs  assistance;History of Falls Sitting-balance support: No upper extremity supported;Feet unsupported Sitting balance-Leahy Scale: Good     Standing balance support: Bilateral upper extremity supported;During functional activity Standing balance-Leahy Scale: Fair Standing balance comment: reliant on external assist                            Cognition Arousal/Alertness: Awake/alert Behavior During Therapy: WFL for tasks assessed/performed Overall Cognitive Status: Within Functional Limits for tasks assessed                                 General Comments: talkative and pleasant      Exercises      General Comments        Pertinent Vitals/Pain Pain Assessment: No/denies pain    Home Living Family/patient expects to be discharged to:: Private residence Living Arrangements: Alone Available Help at Discharge: Family;Available 24 hours/day (will d/c home to daughter's house, below information refers ) Type of Home: House Home Access: Stairs to enter   Home Layout: Able to live on main level with bedroom/bathroom Home Equipment: Walker - 2 wheels;Wheelchair - manual;Cane - single point Additional Comments: sponge bathes at baseline    Prior Function Level of Independence: Needs assistance  Gait / Transfers Assistance Needed: pt reports ambulating with use of cane, has a bunion that limits mobility ADL's / Homemaking Assistance Needed: Pt reports having assist for meal prep from family and neighbors, does not drive so takes bus to/from HD and daughter takes him to appointments  PT Goals (current goals can now be found in the care plan section) Acute Rehab PT Goals Patient Stated Goal: go home with daughter PT Goal Formulation: With patient Time For Goal Achievement: 09/23/20 Potential to Achieve Goals: Good Progress towards PT goals: Progressing toward goals;Goals met and updated - see care plan    Frequency    Min 3X/week      PT Plan  Current plan remains appropriate    Co-evaluation              AM-PAC PT "6 Clicks" Mobility   Outcome Measure  Help needed turning from your back to your side while in a flat bed without using bedrails?: A Little Help needed moving from lying on your back to sitting on the side of a flat bed without using bedrails?: A Little Help needed moving to and from a bed to a chair (including a wheelchair)?: None Help needed standing up from a chair using your arms (e.g., wheelchair or bedside chair)?: None Help needed to walk in hospital room?: A Little Help needed climbing 3-5 steps with a railing? : A Little 6 Click Score: 20    End of Session Equipment Utilized During Treatment: Gait belt Activity Tolerance: Patient tolerated treatment well Patient left: with call bell/phone within reach;in bed;with bed alarm set Nurse Communication: Mobility status PT Visit Diagnosis: Other abnormalities of gait and mobility (R26.89);Difficulty in walking, not elsewhere classified (R26.2)     Time: 0315-9458 PT Time Calculation (min) (ACUTE ONLY): 27 min  Charges:  $Gait Training: 8-22 mins $Therapeutic Activity: 8-22 mins                     Blondell Reveal Kistler PT 09/10/2020  Acute Rehabilitation Services Pager [phone] Office [phone]

## 2020-09-10 NOTE — Progress Notes (Addendum)
PROGRESS NOTE  William [name] NWG:[id number] DOB: Dec 11, 1940 DOA: 09/07/2020 PCP: Patient, No Pcp Per  Brief History   William [name] is a 79 y.o. male with medical history significant for ESRD on hemodialysis, history of diabetes mellitus, hypertension, nonsustained ventricular tachycardia, and CAD, now presenting to emergency department after a transient loss of consciousness.  The patient reports that he went to dialysis on 09/06/2020, was feeling generally weak and with a vague sense of unwellness, but completed the session and returned home where he remembers entering his house and then waking up on the floor the following morning.  He felt generally weak and had had urinary incontinence, was unable to get up from the floor, and remained there until family came last night.  He denies any recent fevers, chills, chest pain, or palpitations.  He has not noticed any change in his chronic mild ankle swelling and denies any leg tenderness or hemoptysis.  He does not believe that he takes diabetes medications any longer.  ED Course: Upon arrival to the ED, patient is found to be afebrile, saturating well on room air, and with stable blood pressure.  EKG features sinus rhythm with LBBB.  Noncontrast head CT is negative for acute intracranial abnormality.  Chemistry panel features a normal potassium, normal bicarbonate, BUN of 63, and glucose 64.  CBC notable for macrocytosis without anemia.  Serum CK is mildly elevated.  Pacer was interrogated and no events noted per ED physician report.  COVID-19 was negative.  [facility] were consulted to admit the patient.   For this writer the patient stated that his first syncopal episode was on Wednesday. He states that he was eating normal prior to Wednesday. He states that he was unable to eat after Wednesday, because he was "sick". He is unable to tell me what that means other than he was very weak. He does not endorse hypoglycemia as a cause for his  syncope.  EEG is complete and is negative for seizures. Echocardiogram is also within normal limits. Orthostatic vitals are pending. PT/OT has been ordered.  Work up for syncope is negative. Echo is within normal limits. No abnormality on telemetry. Orthostatic vitals are negative. No infectious source identified of suspected. The only cause for syncope is hypoglycemia.   I have discussed the patient with HD. He will have dialysis today, but it will not be until late. He should be discharged following HD, but states that he cannot get a ride. His discharge will be delayed until tomorrow am.  I have discussed the patient with his daughter [name] in detail. She states that the patient has been complaining of shoulder and hip pain since falls. He has made no mention of this patient to me. I will get x-rays of hips and shoulders.  Consultants  . None  Procedures  . EEG  Antibiotics   Anti-infectives (From admission, onward)   None     Subjective  The patient is resting quietly. No new complaints.   Objective   Vitals:  Vitals:   09/10/20 1411 09/10/20 1440  BP:  131/61  Pulse:  67  Resp:  18  Temp:  98.1 F (36.7 C)  SpO2: 99% 100%   Exam:  Constitutional:  . The patient is awake, alert, and oriented x 3. No acute distress. Respiratory:  . No increased work of breathing. . No wheezes, rales, or rhonchi . No tactile fremitus Cardiovascular:  . Regular rate and rhythm . No murmurs, ectopy, or gallups. [name]  No lateral PMI. No thrills. Abdomen:  . Abdomen is soft, non-tender, non-distended . No hernias, masses, or organomegaly . Normoactive bowel sounds.  Musculoskeletal:  . No cyanosis, clubbing, or edema Skin:  . No rashes, lesions, ulcers . palpation of skin: no induration or nodules Neurologic:  . CN 2-12 intact . Sensation all 4 extremities intact Psychiatric:  . Mental status o Mood, affect appropriate o Orientation to person, place, time  . judgment and  insight appear intact  I have personally reviewed the following:   Today's Data  . Vitals, CBC, BMP  Micro Data  . Blood culture  Imaging  . CT head  Cardiology Data  . EKG: Sinus rhythm with LBBB which is new since previous EKG in 11/09/2019.  Scheduled Meds: . aspirin EC  81 mg Oral Daily  . calcitRIOL  1.25 mcg Oral Q T,Th,Sa-HD  . Chlorhexidine Gluconate Cloth  6 each Topical Q0600  . heparin  5,000 Units Subcutaneous Q8H  . insulin aspart  0-6 Units Subcutaneous TID WC  . rosuvastatin  10 mg Oral Daily  . sodium chloride flush  3 mL Intravenous Q12H   Continuous Infusions: . sodium chloride      Principal Problem:   Transient loss of consciousness Active Problems:   Ventricular tachycardia, non-sustained (HCC)   CAD (coronary artery disease)   LOS: 0 days   A & P  Syncope: The patient presents after a transient loss of consciousness with urinary incontinence and general weakness, had no acute findings on CT head, slightly low serum glucose, no events on pacer interrogation, and normal orthostatic vitals. For this writer the patient stated that his first syncopal episode was on Wednesday. He states that he was eating normal prior to Wednesday. He states that he was unable to eat after Wednesday, because he was "sick". He is unable to tell me what that means other than he was very weak. He does not endorse hypoglycemia as a cause for his syncope, however, this is the only likely cause. Echocardiogram is within normal limits. Orthostatics are pending. EEG has been read and is negative for seizures. Blood cultures x 2 have had no growth.   Hypoglycemia: The patient reports that prior to his syncopal episode on the last Wednesday he was eating well. He does not endorse hypoglycemia as a cause of his syncope. Glucoses from admitting have been 90, 50, 50, 61, 93. Diet has been liberalized.  There has been no hypoglycemia in the last 24 hours. Monitor glucoses carefully.  Hip  and shoulder pain bilaterally: Following falls per daughter [name]. X-rays ordered.  CAD: Pt denies chest pain. However, his EKG does demonstrate a new LBBB. Echocardiogram is pending. Continue ASA and statin    ESRD: Will consult nephrology in am. Pt is not volume overloaded, acidotic or hyperkalemic. I believe his usual schedule is T, Th, Sa. Medications will be renally dosed.  The patient will have HD here late today.  DVT prophylaxis: sq heparin   Code Status: DNR  Family Communication: None available Disposition Plan:  Patient is from: Home  Anticipated d/c is to: TBD Anticipated d/c date is: 09/09/20  Karie Kirks, DO [facility] Direct contact: see [email or web address]  7PM-7AM contact night coverage as above 09/10/2020, 4:42 PM  LOS: 0 days

## 2020-09-11 DIAGNOSIS — N186 End stage renal disease: Secondary | ICD-10-CM | POA: Diagnosis not present

## 2020-09-11 DIAGNOSIS — D509 Iron deficiency anemia, unspecified: Secondary | ICD-10-CM | POA: Diagnosis not present

## 2020-09-11 DIAGNOSIS — N2581 Secondary hyperparathyroidism of renal origin: Secondary | ICD-10-CM | POA: Diagnosis not present

## 2020-09-11 DIAGNOSIS — Z992 Dependence on renal dialysis: Secondary | ICD-10-CM | POA: Diagnosis not present

## 2020-09-11 DIAGNOSIS — E119 Type 2 diabetes mellitus without complications: Secondary | ICD-10-CM | POA: Diagnosis not present

## 2020-09-11 DIAGNOSIS — D688 Other specified coagulation defects: Secondary | ICD-10-CM | POA: Diagnosis not present

## 2020-09-11 DIAGNOSIS — T8249XA Other complication of vascular dialysis catheter, initial encounter: Secondary | ICD-10-CM | POA: Diagnosis not present

## 2020-09-11 LAB — GLUCOSE, CAPILLARY
Glucose-Capillary: 103 mg/dL — ABNORMAL HIGH (ref 70–99)
Glucose-Capillary: 83 mg/dL (ref 70–99)

## 2020-09-11 MED ORDER — ALTEPLASE 2 MG IJ SOLR: 2.0000 mg | Freq: Once | INTRAMUSCULAR | Status: DC | PRN

## 2020-09-11 MED ORDER — SODIUM CHLORIDE 0.9 % IV SOLN: 100.0000 mL | INTRAVENOUS | Status: DC | PRN

## 2020-09-11 MED ORDER — PENTAFLUOROPROP-TETRAFLUOROETH EX AERO: 1.0000 "application " | INHALATION_SPRAY | CUTANEOUS | Status: DC | PRN

## 2020-09-11 MED ORDER — LIDOCAINE-PRILOCAINE 2.5-2.5 % EX CREA: 1.0000 "application " | TOPICAL_CREAM | CUTANEOUS | Status: DC | PRN

## 2020-09-11 MED ORDER — HEPARIN SODIUM (PORCINE) 1000 UNIT/ML DIALYSIS: 4000.0000 [IU] | Freq: Once | INTRAMUSCULAR | Status: DC

## 2020-09-11 MED ORDER — HEPARIN SODIUM (PORCINE) 1000 UNIT/ML DIALYSIS: 1000.0000 [IU] | INTRAMUSCULAR | Status: DC | PRN

## 2020-09-11 MED ORDER — LIDOCAINE HCL (PF) 1 % IJ SOLN: 5.0000 mL | INTRAMUSCULAR | Status: DC | PRN

## 2020-09-11 NOTE — TOC Transition Note (Signed)
Transition of Care [facility]) - CM/SW Discharge Note   Patient Details  Name: William [name] MRN: [id number] Date of Birth: 1941/05/04  [facility]) CM/SW Contact:  Joanne Chars, LCSW Phone Number: 09/11/2020, 10:15 AM   Clinical Narrative:   Pt discharging straight to dialysis today and then home.  HH arranged with [facility].  Daughter will provide transport.  No other needs identified.     Final next level of care: [place] Barriers to Discharge: No Barriers Identified   Patient Goals and CMS Choice Patient states their goals for this hospitalization and ongoing recovery are:: "better flexibility" CMS Medicare.gov Compare Post Acute Care list provided to:: Patient Choice offered to / list presented to : Patient  Discharge Placement                       Discharge Plan and Services     Post Acute Care Choice: Home Health          DME Arranged: N/A         HH Arranged: PT, OT [place] Agency: [facility] ([facility]) Date [place]: 09/10/20 Time [place]: 1200 Representative spoke with at [place]: [place] ([place]) Interventions     Readmission Risk Interventions No flowsheet data found.

## 2020-09-11 NOTE — TOC Initial Note (Signed)
Transition of Care [facility]) - Initial/Assessment Note    Patient Details  Name: William [name] MRN: [id number] Date of Birth: 1941-01-03  Transition of Care [facility]) CM/SW Contact:    Joanne Chars, LCSW Phone Number: 09/11/2020, 8:37 AM  Clinical Narrative:      CSW spoke with pt about discharge plan.  Pt agreeable to [facility], reports he lives alone but has neighbor who is available as needed, permission given to speak with [facility] and with all three daughters.  Choice document given.  Equipment: currently has walker, wheelchair, and cane.  1500: Spoke with pt regarding [facility] for [facility] and possible DC today.  Pt is M-W-F for dialysis, states he did not have Dialysis yesterday, CSW messaged MD/Colleen Brigitte Pulse and pt is scheduled to have dialysis later today.              Expected Discharge Plan: [place] Barriers to Discharge: No Barriers Identified   Patient Goals and CMS Choice Patient states their goals for this hospitalization and ongoing recovery are:: "better flexibility" CMS Medicare.gov Compare Post Acute Care list provided to:: Patient Choice offered to / list presented to : Patient  Expected Discharge Plan and Services Expected Discharge Plan: [place] Choice: [place] arrangements for the past 2 months: Single Family Home Expected Discharge Date: 09/10/20               DME Arranged: N/A         HH Arranged: PT, OT HH Agency: [facility] (now [facility]) Date [place]: 09/10/20 Time [place]: 1200 Representative spoke with at [place]: [place] Arrangements/Services Living arrangements for the past 2 months: [place] with:: Self Patient language and need for interpreter reviewed:: Yes Do you feel safe going back to the place where you live?: Yes      Need for Family Participation in Patient Care: No (Comment) Care giver support system in  place?: Yes (comment) Current home services: Other (comment) (none) Criminal Activity/Legal Involvement Pertinent to Current Situation/Hospitalization: No - Comment as needed  Activities of Daily Living      Permission Sought/Granted Permission sought to share information with : Facility Sport and exercise psychologist, Family Supports Permission granted to share information with : Yes, Verbal Permission Granted  Share Information with NAME: all three daughters: Cheri Kearns  Permission granted to share info w AGENCY: [facility]        Emotional Assessment Appearance:: Appears stated age Attitude/Demeanor/Rapport: Engaged Affect (typically observed): Appropriate, Pleasant Orientation: : Oriented to Self, Oriented to Place, Oriented to  Time, Oriented to Situation Alcohol / Substance Use: Not Applicable Psych Involvement: No (comment)  Admission diagnosis:  Syncope and collapse [R55] Transient loss of consciousness [R55] Patient Active Problem List   Diagnosis Date Noted  . Transient loss of consciousness 09/08/2020  . Hypovitaminosis D 02/06/2020  . PAD (peripheral artery disease) ([place]) 02/06/2020  . Complication of vascular dialysis catheter 11/28/2019  . Allergy, unspecified, initial encounter 07/31/2019  . 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  . Hospital discharge follow-up 04/11/2019  . 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 Encounter for removal of sutures 12/13/2018  . Iron deficiency anemia, unspecified 11/30/2018  . Uremia syndrome   . Malnutrition of moderate degree 11/19/2018  . 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 Chronic systolic (congestive) heart failure ([place]) 11/18/2018  . Fever, unspecified 11/18/2018  . Other specified coagulation  defects ([place]) 11/18/2018  . Pain, unspecified 11/18/2018  . Sarcoidosis, unspecified 11/18/2018  . Secondary hyperparathyroidism of renal origin ([place]) 11/18/2018  . Shortness of breath 11/18/2018  . Type 2 diabetes mellitus with diabetic  peripheral angiopathy without gangrene ([place]) 11/18/2018  . Acute on chronic combined systolic and diastolic CHF (congestive heart failure) ([place])   . Abdominal pain   . Renal cyst 10/17/2018  . Ventricular tachycardia, non-sustained ([place])   . Decreased activities of daily living (ADL)   . Palliative care by specialist   . 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 Dehydration   . Gait instability 01/06/2018  . Type 2 diabetes mellitus without complication, without long-term current use of insulin ([place]) 10/30/2017  . Benign essential HTN 10/30/2017  . Hyperlipidemia 10/30/2017  . Ethmoid sinusitis 07/11/2015  . Subdural hematoma ([place]) 07/08/2015  . CAD (coronary artery disease) 10/26/2014  . 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   PCP:  Patient, No Pcp Per Pharmacy:   [place] - [address] [place] [address] Phone: [phone] Fax: [place] [address] [place] [facility] [place] [address] Phone: [phone] Fax: [phone]     Social Determinants of Health (SDOH) Interventions    Readmission Risk Interventions No flowsheet data found.

## 2020-09-11 NOTE — Progress Notes (Signed)
Patient was to be discharged yesterday after his dialysis however he did not complete dialysis until early this morning.  No new issues noted.  Please review discharge summary done by Dr. Benny Lennert for further details.    Patient has been stable.  Vital signs noted to be stable. He denies any complaints.  Discussed with nephrology.  Cleared for discharge.  Okay for discharge today.  Discussed with transition of care team as well.  Bonnielee Haff 09/11/2020

## 2020-09-12 ENCOUNTER — Telehealth: Payer: Self-pay | Admitting: Nephrology

## 2020-09-12 ENCOUNTER — Ambulatory Visit: Payer: Medicare HMO | Admitting: Sports Medicine

## 2020-09-12 NOTE — Telephone Encounter (Signed)
Successful  Call for tcm  Transition of care  Contact from inpatient facility     Date of DC  09/10/20   Date of Contact   09/12/20  Phone Contacted = Daughter   Patient " daughter contacted to discuss transition of care from recent hospitalization . Was admitted to [facility] from  09/07/20  To 09/10/20 with Syncope / Hypoglycemia. OP EDW was increased  With no new med's   Medications reviewed and Patient will have follow up with [facility] on 09/13/20

## 2020-09-13 LAB — CULTURE, BLOOD (ROUTINE X 2)
Culture: NO GROWTH
Culture: NO GROWTH
Special Requests: ADEQUATE

## 2020-09-14 DIAGNOSIS — E1122 Type 2 diabetes mellitus with diabetic chronic kidney disease: Secondary | ICD-10-CM | POA: Diagnosis not present

## 2020-09-14 DIAGNOSIS — R69 Illness, unspecified: Secondary | ICD-10-CM | POA: Diagnosis not present

## 2020-09-14 DIAGNOSIS — R55 Syncope and collapse: Secondary | ICD-10-CM | POA: Diagnosis not present

## 2020-09-14 DIAGNOSIS — D509 Iron deficiency anemia, unspecified: Secondary | ICD-10-CM | POA: Diagnosis not present

## 2020-09-14 DIAGNOSIS — I2510 Atherosclerotic heart disease of native coronary artery without angina pectoris: Secondary | ICD-10-CM | POA: Diagnosis not present

## 2020-09-14 DIAGNOSIS — E1151 Type 2 diabetes mellitus with diabetic peripheral angiopathy without gangrene: Secondary | ICD-10-CM | POA: Diagnosis not present

## 2020-09-14 DIAGNOSIS — E785 Hyperlipidemia, unspecified: Secondary | ICD-10-CM | POA: Diagnosis not present

## 2020-09-14 DIAGNOSIS — N186 End stage renal disease: Secondary | ICD-10-CM | POA: Diagnosis not present

## 2020-09-14 DIAGNOSIS — I132 Hypertensive heart and chronic kidney disease with heart failure and with stage 5 chronic kidney disease, or end stage renal disease: Secondary | ICD-10-CM | POA: Diagnosis not present

## 2020-09-14 DIAGNOSIS — I5043 Acute on chronic combined systolic (congestive) and diastolic (congestive) heart failure: Secondary | ICD-10-CM | POA: Diagnosis not present

## 2020-09-14 DIAGNOSIS — E440 Moderate protein-calorie malnutrition: Secondary | ICD-10-CM | POA: Diagnosis not present

## 2020-09-24 DIAGNOSIS — E785 Hyperlipidemia, unspecified: Secondary | ICD-10-CM | POA: Diagnosis not present

## 2020-09-24 DIAGNOSIS — E440 Moderate protein-calorie malnutrition: Secondary | ICD-10-CM | POA: Diagnosis not present

## 2020-09-24 DIAGNOSIS — E1151 Type 2 diabetes mellitus with diabetic peripheral angiopathy without gangrene: Secondary | ICD-10-CM | POA: Diagnosis not present

## 2020-09-24 DIAGNOSIS — I5043 Acute on chronic combined systolic (congestive) and diastolic (congestive) heart failure: Secondary | ICD-10-CM | POA: Diagnosis not present

## 2020-09-24 DIAGNOSIS — R69 Illness, unspecified: Secondary | ICD-10-CM | POA: Diagnosis not present

## 2020-09-24 DIAGNOSIS — I2510 Atherosclerotic heart disease of native coronary artery without angina pectoris: Secondary | ICD-10-CM | POA: Diagnosis not present

## 2020-09-24 DIAGNOSIS — I132 Hypertensive heart and chronic kidney disease with heart failure and with stage 5 chronic kidney disease, or end stage renal disease: Secondary | ICD-10-CM | POA: Diagnosis not present

## 2020-09-24 DIAGNOSIS — E1122 Type 2 diabetes mellitus with diabetic chronic kidney disease: Secondary | ICD-10-CM | POA: Diagnosis not present

## 2020-09-24 DIAGNOSIS — N186 End stage renal disease: Secondary | ICD-10-CM | POA: Diagnosis not present

## 2020-09-24 DIAGNOSIS — D509 Iron deficiency anemia, unspecified: Secondary | ICD-10-CM | POA: Diagnosis not present

## 2020-10-01 DIAGNOSIS — D509 Iron deficiency anemia, unspecified: Secondary | ICD-10-CM | POA: Diagnosis not present

## 2020-10-01 DIAGNOSIS — I5043 Acute on chronic combined systolic (congestive) and diastolic (congestive) heart failure: Secondary | ICD-10-CM | POA: Diagnosis not present

## 2020-10-01 DIAGNOSIS — I2510 Atherosclerotic heart disease of native coronary artery without angina pectoris: Secondary | ICD-10-CM | POA: Diagnosis not present

## 2020-10-01 DIAGNOSIS — E1122 Type 2 diabetes mellitus with diabetic chronic kidney disease: Secondary | ICD-10-CM | POA: Diagnosis not present

## 2020-10-01 DIAGNOSIS — E440 Moderate protein-calorie malnutrition: Secondary | ICD-10-CM | POA: Diagnosis not present

## 2020-10-01 DIAGNOSIS — E785 Hyperlipidemia, unspecified: Secondary | ICD-10-CM | POA: Diagnosis not present

## 2020-10-01 DIAGNOSIS — N186 End stage renal disease: Secondary | ICD-10-CM | POA: Diagnosis not present

## 2020-10-01 DIAGNOSIS — R69 Illness, unspecified: Secondary | ICD-10-CM | POA: Diagnosis not present

## 2020-10-01 DIAGNOSIS — E1151 Type 2 diabetes mellitus with diabetic peripheral angiopathy without gangrene: Secondary | ICD-10-CM | POA: Diagnosis not present

## 2020-10-01 DIAGNOSIS — I132 Hypertensive heart and chronic kidney disease with heart failure and with stage 5 chronic kidney disease, or end stage renal disease: Secondary | ICD-10-CM | POA: Diagnosis not present

## 2020-10-08 DIAGNOSIS — I132 Hypertensive heart and chronic kidney disease with heart failure and with stage 5 chronic kidney disease, or end stage renal disease: Secondary | ICD-10-CM | POA: Diagnosis not present

## 2020-10-08 DIAGNOSIS — I2510 Atherosclerotic heart disease of native coronary artery without angina pectoris: Secondary | ICD-10-CM | POA: Diagnosis not present

## 2020-10-08 DIAGNOSIS — D509 Iron deficiency anemia, unspecified: Secondary | ICD-10-CM | POA: Diagnosis not present

## 2020-10-08 DIAGNOSIS — N186 End stage renal disease: Secondary | ICD-10-CM | POA: Diagnosis not present

## 2020-10-08 DIAGNOSIS — E1122 Type 2 diabetes mellitus with diabetic chronic kidney disease: Secondary | ICD-10-CM | POA: Diagnosis not present

## 2020-10-08 DIAGNOSIS — I5043 Acute on chronic combined systolic (congestive) and diastolic (congestive) heart failure: Secondary | ICD-10-CM | POA: Diagnosis not present

## 2020-10-08 DIAGNOSIS — E785 Hyperlipidemia, unspecified: Secondary | ICD-10-CM | POA: Diagnosis not present

## 2020-10-08 DIAGNOSIS — R69 Illness, unspecified: Secondary | ICD-10-CM | POA: Diagnosis not present

## 2020-10-08 DIAGNOSIS — E1151 Type 2 diabetes mellitus with diabetic peripheral angiopathy without gangrene: Secondary | ICD-10-CM | POA: Diagnosis not present

## 2020-10-08 DIAGNOSIS — E440 Moderate protein-calorie malnutrition: Secondary | ICD-10-CM | POA: Diagnosis not present

## 2020-10-11 DIAGNOSIS — N186 End stage renal disease: Secondary | ICD-10-CM | POA: Diagnosis not present

## 2020-10-11 DIAGNOSIS — I129 Hypertensive chronic kidney disease with stage 1 through stage 4 chronic kidney disease, or unspecified chronic kidney disease: Secondary | ICD-10-CM | POA: Diagnosis not present

## 2020-10-11 DIAGNOSIS — Z992 Dependence on renal dialysis: Secondary | ICD-10-CM | POA: Diagnosis not present

## 2020-10-14 ENCOUNTER — Ambulatory Visit (INDEPENDENT_AMBULATORY_CARE_PROVIDER_SITE_OTHER): Payer: Medicare PPO

## 2020-10-14 DIAGNOSIS — I441 Atrioventricular block, second degree: Secondary | ICD-10-CM | POA: Diagnosis not present

## 2020-10-14 LAB — CUP PACEART REMOTE DEVICE CHECK
Battery Impedance: 1114 Ohm
Battery Remaining Longevity: 55 mo
Battery Voltage: 2.77 V
Brady Statistic AP VP Percent: 11 %
Brady Statistic AP VS Percent: 0 %
Brady Statistic AS VP Percent: 89 %
Brady Statistic AS VS Percent: 0 %
Date Time Interrogation Session: 20220103050019
Implantable Lead Implant Date: 20060418
Implantable Lead Implant Date: 20060418
Implantable Lead Location: 753859
Implantable Lead Location: 753860
Implantable Lead Model: 5076
Implantable Lead Model: 5076
Implantable Pulse Generator Implant Date: 20060418
Lead Channel Impedance Value: 419 Ohm
Lead Channel Impedance Value: 526 Ohm
Lead Channel Pacing Threshold Amplitude: 0.5 V
Lead Channel Pacing Threshold Amplitude: 0.5 V
Lead Channel Pacing Threshold Pulse Width: 0.4 ms
Lead Channel Pacing Threshold Pulse Width: 0.4 ms
Lead Channel Setting Pacing Amplitude: 1.5 V
Lead Channel Setting Pacing Amplitude: 2 V
Lead Channel Setting Pacing Pulse Width: 0.4 ms
Lead Channel Setting Sensing Sensitivity: 4 mV

## 2020-10-19 ENCOUNTER — Other Ambulatory Visit: Payer: Self-pay | Admitting: Cardiovascular Disease

## 2020-10-21 ENCOUNTER — Encounter (HOSPITAL_COMMUNITY): Payer: Self-pay | Admitting: *Deleted

## 2020-10-21 ENCOUNTER — Telehealth: Payer: Self-pay

## 2020-10-21 ENCOUNTER — Other Ambulatory Visit: Payer: Self-pay

## 2020-10-21 ENCOUNTER — Emergency Department (HOSPITAL_COMMUNITY): Payer: Medicare PPO

## 2020-10-21 ENCOUNTER — Inpatient Hospital Stay (HOSPITAL_COMMUNITY)
Admission: EM | Admit: 2020-10-21 | Discharge: 2020-10-29 | DRG: 177 | Disposition: A | Discharge status: Home Health | Payer: Medicare PPO | Attending: Family Medicine | Admitting: Family Medicine

## 2020-10-21 DIAGNOSIS — I442 Atrioventricular block, complete: Secondary | ICD-10-CM | POA: Diagnosis present

## 2020-10-21 DIAGNOSIS — I120 Hypertensive chronic kidney disease with stage 5 chronic kidney disease or end stage renal disease: Secondary | ICD-10-CM | POA: Diagnosis present

## 2020-10-21 DIAGNOSIS — A0839 Other viral enteritis: Secondary | ICD-10-CM | POA: Diagnosis present

## 2020-10-21 DIAGNOSIS — E785 Hyperlipidemia, unspecified: Secondary | ICD-10-CM | POA: Diagnosis present

## 2020-10-21 DIAGNOSIS — N2581 Secondary hyperparathyroidism of renal origin: Secondary | ICD-10-CM | POA: Diagnosis present

## 2020-10-21 DIAGNOSIS — Z79899 Other long term (current) drug therapy: Secondary | ICD-10-CM

## 2020-10-21 DIAGNOSIS — R296 Repeated falls: Secondary | ICD-10-CM | POA: Diagnosis present

## 2020-10-21 DIAGNOSIS — I2510 Atherosclerotic heart disease of native coronary artery without angina pectoris: Secondary | ICD-10-CM | POA: Diagnosis present

## 2020-10-21 DIAGNOSIS — E1151 Type 2 diabetes mellitus with diabetic peripheral angiopathy without gangrene: Secondary | ICD-10-CM | POA: Diagnosis present

## 2020-10-21 DIAGNOSIS — Z888 Allergy status to other drugs, medicaments and biological substances status: Secondary | ICD-10-CM

## 2020-10-21 DIAGNOSIS — U071 COVID-19: Secondary | ICD-10-CM | POA: Diagnosis not present

## 2020-10-21 DIAGNOSIS — K219 Gastro-esophageal reflux disease without esophagitis: Secondary | ICD-10-CM | POA: Diagnosis present

## 2020-10-21 DIAGNOSIS — R531 Weakness: Secondary | ICD-10-CM

## 2020-10-21 DIAGNOSIS — L8990 Pressure ulcer of unspecified site, unspecified stage: Secondary | ICD-10-CM | POA: Insufficient documentation

## 2020-10-21 DIAGNOSIS — Z992 Dependence on renal dialysis: Secondary | ICD-10-CM

## 2020-10-21 DIAGNOSIS — E871 Hypo-osmolality and hyponatremia: Secondary | ICD-10-CM | POA: Diagnosis present

## 2020-10-21 DIAGNOSIS — Z7982 Long term (current) use of aspirin: Secondary | ICD-10-CM

## 2020-10-21 DIAGNOSIS — E860 Dehydration: Secondary | ICD-10-CM | POA: Diagnosis present

## 2020-10-21 DIAGNOSIS — D869 Sarcoidosis, unspecified: Secondary | ICD-10-CM | POA: Diagnosis present

## 2020-10-21 DIAGNOSIS — F0390 Unspecified dementia without behavioral disturbance: Secondary | ICD-10-CM | POA: Diagnosis present

## 2020-10-21 DIAGNOSIS — W06XXXA Fall from bed, initial encounter: Secondary | ICD-10-CM | POA: Diagnosis present

## 2020-10-21 DIAGNOSIS — Z950 Presence of cardiac pacemaker: Secondary | ICD-10-CM

## 2020-10-21 DIAGNOSIS — W19XXXA Unspecified fall, initial encounter: Secondary | ICD-10-CM

## 2020-10-21 DIAGNOSIS — E1122 Type 2 diabetes mellitus with diabetic chronic kidney disease: Secondary | ICD-10-CM | POA: Diagnosis present

## 2020-10-21 DIAGNOSIS — N186 End stage renal disease: Secondary | ICD-10-CM | POA: Diagnosis present

## 2020-10-21 DIAGNOSIS — D631 Anemia in chronic kidney disease: Secondary | ICD-10-CM | POA: Diagnosis present

## 2020-10-21 LAB — CBC WITH DIFFERENTIAL/PLATELET
Abs Immature Granulocytes: 0.01 10*3/uL (ref 0.00–0.07)
Basophils Absolute: 0 10*3/uL (ref 0.0–0.1)
Basophils Relative: 0 %
Eosinophils Absolute: 0 10*3/uL (ref 0.0–0.5)
Eosinophils Relative: 0 %
HCT: 32.2 % — ABNORMAL LOW (ref 39.0–52.0)
Hemoglobin: 10.4 g/dL — ABNORMAL LOW (ref 13.0–17.0)
Immature Granulocytes: 0 %
Lymphocytes Relative: 21 %
Lymphs Abs: 1.3 10*3/uL (ref 0.7–4.0)
MCH: 32 pg (ref 26.0–34.0)
MCHC: 32.3 g/dL (ref 30.0–36.0)
MCV: 99.1 fL (ref 80.0–100.0)
Monocytes Absolute: 1.1 10*3/uL — ABNORMAL HIGH (ref 0.1–1.0)
Monocytes Relative: 17 %
Neutro Abs: 3.9 10*3/uL (ref 1.7–7.7)
Neutrophils Relative %: 62 %
Platelets: 228 10*3/uL (ref 150–400)
RBC: 3.25 MIL/uL — ABNORMAL LOW (ref 4.22–5.81)
RDW: 15.7 % — ABNORMAL HIGH (ref 11.5–15.5)
WBC: 6.2 10*3/uL (ref 4.0–10.5)
nRBC: 0 % (ref 0.0–0.2)

## 2020-10-21 LAB — COMPREHENSIVE METABOLIC PANEL
ALT: 20 U/L (ref 0–44)
AST: 29 U/L (ref 15–41)
Albumin: 2.8 g/dL — ABNORMAL LOW (ref 3.5–5.0)
Alkaline Phosphatase: 57 U/L (ref 38–126)
Anion gap: 17 — ABNORMAL HIGH (ref 5–15)
BUN: 53 mg/dL — ABNORMAL HIGH (ref 8–23)
CO2: 21 mmol/L — ABNORMAL LOW (ref 22–32)
Calcium: 8.6 mg/dL — ABNORMAL LOW (ref 8.9–10.3)
Chloride: 91 mmol/L — ABNORMAL LOW (ref 98–111)
Creatinine, Ser: 11.28 mg/dL — ABNORMAL HIGH (ref 0.61–1.24)
GFR, Estimated: 4 mL/min — ABNORMAL LOW (ref >60)
Glucose, Bld: 78 mg/dL (ref 70–99)
Potassium: 4 mmol/L (ref 3.5–5.1)
Sodium: 129 mmol/L — ABNORMAL LOW (ref 135–145)
Total Bilirubin: 0.4 mg/dL (ref 0.3–1.2)
Total Protein: 6.5 g/dL (ref 6.5–8.1)

## 2020-10-21 LAB — TROPONIN I (HIGH SENSITIVITY)
Troponin I (High Sensitivity): 34 ng/L — ABNORMAL HIGH (ref <18)
Troponin I (High Sensitivity): 31 ng/L — ABNORMAL HIGH (ref <18)

## 2020-10-21 LAB — CK: Total CK: 263 U/L (ref 49–397)

## 2020-10-21 NOTE — ED Notes (Signed)
ED Provider at bedside. 

## 2020-10-21 NOTE — ED Notes (Signed)
Pt not in room.

## 2020-10-21 NOTE — ED Provider Notes (Signed)
3:59 PM Care assumed from Krista Blue, PA-C and Dr. Theotis Burrow who is waiting for results of laboratory work-up.  Patient had a fall out of bed this morning and has been more fatigued and weak over the last week while having COVID-like symptoms.  Patient reportedly was tested by family yesterday and was found to be COVID-positive.  He has not yet been tested in our system to see the result.  Plan of care is to see what the labs show and determine if patient has any criteria to require admission given his mobility issues as he was reportedly laying in feces and unable to get up and will not ambulate like normal.  Anticipate shared decision-making conversation with patient after work-up.  10:20 PM Patient is still waiting on his delta troponin and his COVID swab.  He is found to be hyponatremic and hypochloremic likely because he is not eating or drinking over the last few days as much.  He does have a drop in his hemoglobin compared to prior.  His CK is not significantly elevated at 263 but his troponin was elevated at 34, will trend.  CT head and other traumatic imaging was negative for acute injuries.  As he is waiting on the delta troponin and the COVID test, I had a discussion with him and patient is concerned about going home.  He agrees that he has not been able to ambulate safely over the last 48 hours and could not get out of the bed to go to the bathroom.  He reports he feels too weak and tired from not eating or drinking and he does not feel safe being at home alone without assistance.  Given the patient's electrolyte imbalance, his likely need for dialysis which she missed today, and his multiple falls over the last week with a was worsening generalized weakness and fatigue and dehydration, do not feel he is safe for discharge home.  Patient will likely admission for PT/OT, monitoring of electrolytes, gentle rehydration, and evaluation of possible skilled nursing needs.  11:31 PM Care  transferred to [facility] team awaiting for troponin and COVID test for admission for further management.    Tegeler, Gwenyth Allegra, MD 10/21/20 [phone]

## 2020-10-21 NOTE — ED Triage Notes (Addendum)
Patient presents to ed via [facility] unwitnessed fall states he was at home and daughter unsure when patient fell could have been last night or this am, daughter found patient in the floor this am.  C/o neck and back pain , states he has chronic back pain , States he is due for dialysis today. Patient tested positive for covid yest states he was having a cough so he was tested. Patient's dialysis cath is in his right upper thigh.
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
## 2020-10-21 NOTE — Telephone Encounter (Signed)
Patients daughter calls nurse line stating her dad woke up "shaking uncontrollably" this morning. Daughter reports he has a new onset of a cough, however "he always gets on this time of year." Daughter has not checked his sugar or his temperature. Daughter reports she is at work now. Advised daughter due to symptoms and limited availability at our clinic she should take him to UC. Daughter appreciative.

## 2020-10-21 NOTE — ED Notes (Signed)
Patient spilled urinal, linens changed. Personal items and call bell in reach. Patient denies further needs.

## 2020-10-21 NOTE — ED Notes (Signed)
Repeat trop drawn during IV initiation by IV team sent to lab

## 2020-10-22 DIAGNOSIS — L8990 Pressure ulcer of unspecified site, unspecified stage: Secondary | ICD-10-CM | POA: Insufficient documentation

## 2020-10-22 DIAGNOSIS — W19XXXA Unspecified fall, initial encounter: Secondary | ICD-10-CM | POA: Diagnosis not present

## 2020-10-22 DIAGNOSIS — R531 Weakness: Secondary | ICD-10-CM

## 2020-10-22 DIAGNOSIS — U071 COVID-19: Secondary | ICD-10-CM | POA: Diagnosis not present

## 2020-10-22 DIAGNOSIS — N186 End stage renal disease: Secondary | ICD-10-CM | POA: Diagnosis not present

## 2020-10-22 HISTORY — DX: Pressure ulcer of unspecified site, unspecified stage: L89.90

## 2020-10-22 LAB — RENAL FUNCTION PANEL
Albumin: 2.6 g/dL — ABNORMAL LOW (ref 3.5–5.0)
Anion gap: 16 — ABNORMAL HIGH (ref 5–15)
BUN: 58 mg/dL — ABNORMAL HIGH (ref 8–23)
CO2: 19 mmol/L — ABNORMAL LOW (ref 22–32)
Calcium: 8.2 mg/dL — ABNORMAL LOW (ref 8.9–10.3)
Chloride: 97 mmol/L — ABNORMAL LOW (ref 98–111)
Creatinine, Ser: 11.21 mg/dL — ABNORMAL HIGH (ref 0.61–1.24)
GFR, Estimated: 4 mL/min — ABNORMAL LOW (ref >60)
Glucose, Bld: 71 mg/dL (ref 70–99)
Phosphorus: 6.4 mg/dL — ABNORMAL HIGH (ref 2.5–4.6)
Potassium: 4 mmol/L (ref 3.5–5.1)
Sodium: 132 mmol/L — ABNORMAL LOW (ref 135–145)

## 2020-10-22 LAB — CBC
HCT: 32.5 % — ABNORMAL LOW (ref 39.0–52.0)
Hemoglobin: 11 g/dL — ABNORMAL LOW (ref 13.0–17.0)
MCH: 33.2 pg (ref 26.0–34.0)
MCHC: 33.8 g/dL (ref 30.0–36.0)
MCV: 98.2 fL (ref 80.0–100.0)
Platelets: 215 10*3/uL (ref 150–400)
RBC: 3.31 MIL/uL — ABNORMAL LOW (ref 4.22–5.81)
RDW: 16.2 % — ABNORMAL HIGH (ref 11.5–15.5)
WBC: 6.3 10*3/uL (ref 4.0–10.5)
nRBC: 0 % (ref 0.0–0.2)

## 2020-10-22 LAB — SARS CORONAVIRUS 2 (TAT 6-24 HRS): SARS Coronavirus 2: POSITIVE — AB

## 2020-10-22 LAB — CBG MONITORING, ED
Glucose-Capillary: 100 mg/dL — ABNORMAL HIGH (ref 70–99)
Glucose-Capillary: 84 mg/dL (ref 70–99)

## 2020-10-22 MED ORDER — PANTOPRAZOLE SODIUM 20 MG PO TBEC
20.0000 mg | DELAYED_RELEASE_TABLET | Freq: Every day | ORAL | Status: DC
  Administered 2020-10-22 – 2020-10-28 (×7): 20 mg via ORAL
  Filled 2020-10-22 (×7): qty 1

## 2020-10-22 MED ORDER — AMIODARONE HCL 200 MG PO TABS
200.0000 mg | ORAL_TABLET | ORAL | Status: DC
  Administered 2020-10-22 – 2020-10-28 (×4): 200 mg via ORAL
  Filled 2020-10-22 (×6): qty 1

## 2020-10-22 MED ORDER — ASPIRIN 81 MG PO CHEW
81.0000 mg | CHEWABLE_TABLET | Freq: Every day | ORAL | Status: DC
  Administered 2020-10-22 – 2020-10-29 (×8): 81 mg via ORAL
  Filled 2020-10-22 (×8): qty 1

## 2020-10-22 MED ORDER — CHLORHEXIDINE GLUCONATE CLOTH 2 % EX PADS
6.0000 | MEDICATED_PAD | Freq: Every day | CUTANEOUS | Status: DC
  Administered 2020-10-23 – 2020-10-29 (×5): 6 via TOPICAL

## 2020-10-22 MED ORDER — HEPARIN SODIUM (PORCINE) 5000 UNIT/ML IJ SOLN
5000.0000 [IU] | Freq: Three times a day (TID) | INTRAMUSCULAR | Status: DC
  Administered 2020-10-22 – 2020-10-29 (×18): 5000 [IU] via SUBCUTANEOUS
  Filled 2020-10-22 (×17): qty 1

## 2020-10-22 MED ORDER — SODIUM CHLORIDE 0.9 % IV SOLN
100.0000 mg | Freq: Every day | INTRAVENOUS | Status: AC
  Administered 2020-10-23 – 2020-10-24 (×2): 100 mg via INTRAVENOUS
  Filled 2020-10-22: qty 20
  Filled 2020-10-22: qty 100

## 2020-10-22 MED ORDER — SODIUM CHLORIDE 0.9 % IV SOLN
200.0000 mg | Freq: Once | INTRAVENOUS | Status: AC
  Administered 2020-10-22: 200 mg via INTRAVENOUS
  Filled 2020-10-22: qty 40

## 2020-10-22 MED ORDER — LOPERAMIDE HCL 2 MG PO CAPS
2.0000 mg | ORAL_CAPSULE | ORAL | Status: DC | PRN
  Administered 2020-10-22: 2 mg via ORAL
  Filled 2020-10-22: qty 1

## 2020-10-22 MED ORDER — CALCITRIOL 0.5 MCG PO CAPS
1.2500 ug | ORAL_CAPSULE | ORAL | Status: DC
  Administered 2020-10-24: 1.25 ug via ORAL
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
## 2020-10-22 NOTE — ED Notes (Signed)
[employer] called, data received, they report there have been no incidents in the past month and device is functioning as programed, they are faxing over full report.

## 2020-10-22 NOTE — ED Notes (Signed)
Pt to dialysis.

## 2020-10-22 NOTE — ED Notes (Signed)
Received call from lab that green top was hemolyzed. Recollect needed.

## 2020-10-22 NOTE — Progress Notes (Signed)
Occupational Therapy Evaluation Patient Details Name: William [name] MRN: [id number] DOB: May 15, 1941 Today's Date: 10/22/2020    History of Present Illness 80 y.o. 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 States he uses "[facility] transportation" to get to HD. Pt presents with generalized weakness and apparent cognitive deficits. Pt had 1 incontinent episode prior to session and was found in feces PTA. Recommend rehab at SNF, however pt states he will refuse SNF. If pt discharges home, he needs 24/7 S with HHOT/PT/HH Aide and Covid at Home program. Will follow acutely. HR 66: BP supine 154/79; sitting 147/83; standing 153/77; SpO2 96 RA during mobility     Follow Up Recommendations  Supervision/Assistance - 24 hour;SNF (Pt declining SNF) ; will need [facility] services if he declines   Equipment Recommendations  3 in 1 bedside commode    Recommendations for Other Services       Precautions / Restrictions Precautions Precautions: Fall Precaution Comments: R femoral permanent HD catheter      Mobility Bed Mobility Overal bed mobility: Needs Assistance Bed Mobility: Supine to Sit;Sit to Supine     Supine to sit: Min assist Sit to supine: Min assist        Transfers Overall transfer level: Needs assistance   Transfers: Sit to/from [employer] Transfers Sit to Stand: Min assist Stand pivot transfers: Min assist       General transfer comment: Became min guard assist toward end of session    Balance Overall balance assessment: History of Falls;Needs assistance   Sitting balance-Leahy Scale: Fair       Standing balance-Leahy Scale: Fair                              ADL either performed or assessed with clinical judgement   ADL Overall ADL's : Needs assistance/impaired Eating/Feeding: Modified independent   Grooming: Set up;Sitting   Upper Body Bathing: Set up;Sitting   Lower Body Bathing: Minimal assistance;Sit to/from stand   Upper Body Dressing : Set up;Sitting   Lower Body Dressing: Minimal assistance;Sit to/from stand   Toilet Transfer: Ambulation;Minimal assistance   Toileting- Water quality scientist and Hygiene: Total assistance Toileting - Clothing Manipulation Details (indicate cue type and reason): Pt incontinent of BM earlier; has been having incontinence episodes at home     Functional mobility during ADLs: Minimal assistance;Cueing for safety       Vision Patient Visual Report: No change from baseline       Perception     Praxis      Pertinent Vitals/Pain Pain Assessment: No/denies pain     Hand Dominance Right   Extremity/Trunk Assessment Upper Extremity Assessment Upper Extremity Assessment: Generalized weakness   Lower Extremity Assessment Lower Extremity Assessment: Defer to PT evaluation   Cervical / Trunk Assessment Cervical / Trunk Assessment: Kyphotic;Other exceptions (forward head)   Communication Communication Communication: No difficulties   Cognition Arousal/Alertness: Awake/alert Behavior During Therapy: WFL for tasks assessed/performed (joking with therapists) Overall Cognitive Status: Impaired/Different from baseline Area of Impairment: Attention;Memory;Awareness                   Current Attention Level: Sustained Memory: Decreased recall of  precautions;Decreased short-term memory     Awareness: Emergent   General Comments: Assessed with the Short Blessed Test of Memory and concentration, scoring a 6 which places him in the quesitonable impariment range. Pt requires increased amount of time for processing any information. Unable to recall information after 5 minute delay. Slow  shuffling gait during mobility. High risk of falls.    General Comments       Exercises     Shoulder Instructions      Home Living Family/patient expects to be discharged to:: Private residence Living Arrangements: Alone Available Help at Discharge: Family;Available 24 hours/day Type of Home: House Home Access: Stairs to enter [employer] of Steps: 3 Entrance Stairs-Rails: Left Home Layout: Able to live on main level with bedroom/bathroom     Bathroom Shower/Tub: Occupational psychologist: Standard Bathroom Accessibility: Yes How Accessible: Accessible via walker Home Equipment: [place] - 2 wheels;Wheelchair - manual;Cane - single point;Shower seat   Additional Comments: sponge bathes at baseline      Prior Functioning/Environment Level of Independence: Independent with assistive device(s)        Comments: sometimes uses a cane; orders food online        OT Problem List: Decreased strength;Decreased activity tolerance;Impaired balance (sitting and/or standing);Decreased cognition;Decreased safety awareness;Decreased knowledge of use of DME or AE      OT Treatment/Interventions: Self-care/ADL training;Therapeutic exercise;Neuromuscular education;Energy conservation;DME and/or AE instruction;Therapeutic activities;Cognitive remediation/compensation;Patient/family education;Balance training    OT Goals(Current goals can be found in the care plan section) Acute Rehab OT Goals Patient Stated Goal: to go home OT Goal Formulation: With patient Time For Goal Achievement: 11/05/20 Potential to Achieve Goals: Good  OT Frequency: Min 2X/week   Barriers to D/C:            Co-evaluation PT/OT/SLP Co-Evaluation/Treatment: Yes Reason for Co-Treatment: To address functional/ADL transfers   OT goals addressed during session: ADL's and self-care      AM-PAC OT "6 Clicks" Daily Activity     Outcome Measure Help from another person eating meals?:  None Help from another person taking care of personal grooming?: A Little Help from another person toileting, which includes using toliet, bedpan, or urinal?: A Little Help from another person bathing (including washing, rinsing, drying)?: A Little Help from another person to put on and taking off regular upper body clothing?: A Little Help from another person to put on and taking off regular lower body clothing?: A Little 6 Click Score: 19   End of Session Nurse Communication: Mobility status  Activity Tolerance: Patient tolerated treatment well Patient left: in bed;with call bell/phone within reach  OT Visit Diagnosis: Unsteadiness on feet (R26.81);Other abnormalities of gait and mobility (R26.89);Muscle weakness (generalized) (M62.81);History of falling (Z91.81);Other symptoms and signs involving cognitive function                Time: 1029-1108 OT Time Calculation (min): 39 min Charges:  OT General Charges $OT Visit: 1 Visit OT Evaluation $OT Eval Moderate Complexity: [place], OT/L   Acute OT Clinical Specialist Acute Rehabilitation Services Pager [phone] Office [phone]   [facility] 10/22/2020, 12:32 PM

## 2020-10-22 NOTE — ED Notes (Signed)
Lunch Tray Ordered @ [id number].

## 2020-10-22 NOTE — ED Notes (Signed)
Recollect green top sent

## 2020-10-22 NOTE — ED Notes (Signed)
Assisted patient with BSC use, had small, loose brown BM.

## 2020-10-22 NOTE — ED Notes (Signed)
[employer] pacemaker interrogation performed with data sent message received.

## 2020-10-22 NOTE — ED Notes (Signed)
Patient provided with sandwich bag and water. Feeding self at this time.

## 2020-10-22 NOTE — ED Provider Notes (Signed)
Care assumed from Dr. Sherry Ruffing at shift change.  Patient with recent diagnosis of COVID brought for evaluation of weakness and falls.  Patient had another fall today and had difficulty getting up.  He was brought here by EMS for further evaluation.  Care signed out to me awaiting results of a COVID test and repeat troponin.  The troponin is negative, but COVID test is pending.  Either way, I feel as though patient will require admission.  I have spoken with the family medicine service who will evaluate and admit.   Veryl Speak, MD 10/22/20 [phone]

## 2020-10-22 NOTE — ED Notes (Signed)
Lunch Tray Ordered @ 1715. 
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
## 2020-10-22 NOTE — Progress Notes (Signed)
10/22/20 1401  PT Evaluation Information  Last PT Received On 10/22/20  Assistance Needed +1  PT/OT/SLP Co-Evaluation/Treatment Yes  Reason for Co-Treatment To address functional/ADL transfers  PT goals addressed during session Mobility/safety with mobility;Balance  History of Present Illness 80 y.o. 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  Precautions  Precautions Fall  Precaution Comments R femoral permanent HD catheter  Restrictions  Weight Bearing Restrictions No  Home Living  Family/patient expects to be discharged to: Private residence  Living Arrangements Alone  Available Help at Discharge Family;Available 24 hours/day  Type of Home House  Home Access Stairs to enter  Entrance Stairs-Number of Steps 3  Entrance Stairs-Rails Left  Home Layout Able to live on main level with bedroom/bathroom  Bathroom Shower/Tub Walk-in Cytogeneticist Yes  Home Equipment [place] - 2 wheels;Wheelchair - manual;Cane - single point;Shower seat  Additional Comments sponge bathes at baseline  Prior Function  Level of Independence Independent with assistive device(s)  Comments sometimes uses a cane; orders food online  Communication  Communication No difficulties  Pain Assessment  Pain Assessment No/denies pain  Cognition  Arousal/Alertness Awake/alert  Behavior During Therapy WFL for tasks assessed/performed (joking with therapists)  Overall Cognitive Status Impaired/Different from baseline  Area of Impairment Attention;Memory;Awareness  Current Attention Level Sustained  Memory Decreased recall of precautions;Decreased short-term memory  Awareness Emergent  General Comments Pt requires increased amount of time for processing any information. Unable to recall  information after 5 minute delay.  Upper Extremity Assessment  Upper Extremity Assessment Defer to OT evaluation  Lower Extremity Assessment  Lower Extremity Assessment Generalized weakness  Cervical / Trunk Assessment  Cervical / Trunk Assessment Kyphotic;Other exceptions (forward head)  Bed Mobility  Overal bed mobility Needs Assistance  Bed Mobility Supine to Sit;Sit to Supine  Supine to sit Min assist  Sit to supine Min assist  General bed mobility comments Min A for trunk A to come to sitting. Required assist for LEs for return to supine.  Transfers  Overall transfer level Needs assistance  Equipment used None  Transfers Sit to/from [employer] Transfers  Sit to Stand Min assist  General transfer comment Min A for lift assist and steadying.  Ambulation/Gait  Ambulation/Gait assistance Min assist;Min guard  Gait Distance (Feet) 20 Feet  Assistive device None  Gait Pattern/deviations Step-through pattern;Decreased stride length  General Gait Details Very short step length and guarded gait throughout. Initially requiring min A for steadying, but balance improving with and requiring min guard at end of gait.  Gait velocity Decreased  Balance  Overall balance assessment History of Falls;Needs assistance  Sitting-balance support No upper extremity supported  Sitting balance-Leahy Scale Fair  Standing balance support No upper extremity supported  Standing balance-Leahy Scale Fair  PT - End of Session  Activity Tolerance Patient tolerated treatment well  Patient left in bed;with call bell/phone within reach (on stretcher in ED)  Nurse Communication Mobility status  PT Assessment  PT Recommendation/Assessment Patient needs continued PT services  PT Visit Diagnosis Unsteadiness on feet (R26.81);Muscle weakness (generalized) (M62.81)  PT Problem List Decreased strength;Decreased balance;Decreased mobility;Decreased activity tolerance;Decreased knowledge of  precautions;Decreased cognition;Decreased knowledge of use of DME;Decreased safety awareness  Barriers to Discharge Decreased caregiver support  PT Plan  PT Frequency (ACUTE ONLY) Min 3X/week  PT  Treatment/Interventions (ACUTE ONLY) Gait training;DME instruction;Functional mobility training;Stair training;Therapeutic exercise;Therapeutic activities;Balance training;Cognitive remediation;Patient/family education  AM-PAC PT "6 Clicks" Mobility Outcome Measure (Version 2)  Help needed turning from your back to your side while in a flat bed without using bedrails? 3  Help needed moving from lying on your back to sitting on the side of a flat bed without using bedrails? 3  Help needed moving to and from a bed to a chair (including a wheelchair)? 3  Help needed standing up from a chair using your arms (e.g., wheelchair or bedside chair)? 3  Help needed to walk in hospital room? 3  Help needed climbing 3-5 steps with a railing?  2  6 Click Score 17  Consider Recommendation of Discharge To: Home with [facility]  PT Recommendation  Follow Up Recommendations SNF;Supervision/Assistance - 24 hour (pt likely to refuse; will need max HH services)  PT equipment 3in1 (PT)  Individuals Consulted  Consulted and Agree with Results and Recommendations Patient  Acute Rehab PT Goals  Patient Stated Goal to go home  PT Goal Formulation With patient  Time For Goal Achievement 11/05/20  Potential to Achieve Goals Good  PT Time Calculation  PT Start Time (ACUTE ONLY) 1029  PT Stop Time (ACUTE ONLY) 1108  PT Time Calculation (min) (ACUTE ONLY) 39 min  PT General Charges  $$ ACUTE PT VISIT 1 Visit  PT Evaluation  $PT Eval Moderate Complexity 1 Mod  PT Treatments  $Therapeutic Activity 8-22 mins  Written Expression  Dominant Hand Right    Pt admitted secondary to problem above with deficits above. Seen with OT in the ED for safety purposes given hx. Pt requiring min to min guard A for mobility tasks. Slow  processing noted throughout session. Pt had 1 incontinent episode prior to session and was found in feces PTA. Recommend rehab at SNF, however pt states he will refuse SNF. If pt discharges home, he needs 24/7 S with HHPT/OT/HH Aide and Covid at Home program. Will continue to follow acutely.   Reuel Derby, PT, DPT  Acute Rehabilitation Services  Pager: [phone] Office: [phone]

## 2020-10-22 NOTE — ED Notes (Signed)
Tele  Breakfast Ordered 

## 2020-10-23 ENCOUNTER — Other Ambulatory Visit: Payer: Self-pay

## 2020-10-23 ENCOUNTER — Encounter (HOSPITAL_COMMUNITY): Payer: Self-pay | Admitting: Family Medicine

## 2020-10-23 DIAGNOSIS — Z888 Allergy status to other drugs, medicaments and biological substances status: Secondary | ICD-10-CM | POA: Diagnosis not present

## 2020-10-23 DIAGNOSIS — Z992 Dependence on renal dialysis: Secondary | ICD-10-CM | POA: Diagnosis not present

## 2020-10-23 DIAGNOSIS — R531 Weakness: Secondary | ICD-10-CM | POA: Diagnosis present

## 2020-10-23 DIAGNOSIS — F0390 Unspecified dementia without behavioral disturbance: Secondary | ICD-10-CM | POA: Diagnosis present

## 2020-10-23 DIAGNOSIS — N2581 Secondary hyperparathyroidism of renal origin: Secondary | ICD-10-CM | POA: Diagnosis present

## 2020-10-23 DIAGNOSIS — Z7982 Long term (current) use of aspirin: Secondary | ICD-10-CM | POA: Diagnosis not present

## 2020-10-23 DIAGNOSIS — Z950 Presence of cardiac pacemaker: Secondary | ICD-10-CM | POA: Diagnosis not present

## 2020-10-23 DIAGNOSIS — E1122 Type 2 diabetes mellitus with diabetic chronic kidney disease: Secondary | ICD-10-CM | POA: Diagnosis present

## 2020-10-23 DIAGNOSIS — K219 Gastro-esophageal reflux disease without esophagitis: Secondary | ICD-10-CM | POA: Diagnosis present

## 2020-10-23 DIAGNOSIS — D631 Anemia in chronic kidney disease: Secondary | ICD-10-CM | POA: Diagnosis present

## 2020-10-23 DIAGNOSIS — Z79899 Other long term (current) drug therapy: Secondary | ICD-10-CM | POA: Diagnosis not present

## 2020-10-23 DIAGNOSIS — U071 COVID-19: Secondary | ICD-10-CM | POA: Diagnosis present

## 2020-10-23 DIAGNOSIS — E785 Hyperlipidemia, unspecified: Secondary | ICD-10-CM | POA: Diagnosis present

## 2020-10-23 DIAGNOSIS — I120 Hypertensive chronic kidney disease with stage 5 chronic kidney disease or end stage renal disease: Secondary | ICD-10-CM | POA: Diagnosis present

## 2020-10-23 DIAGNOSIS — D869 Sarcoidosis, unspecified: Secondary | ICD-10-CM | POA: Diagnosis present

## 2020-10-23 DIAGNOSIS — E871 Hypo-osmolality and hyponatremia: Secondary | ICD-10-CM | POA: Diagnosis present

## 2020-10-23 DIAGNOSIS — W19XXXA Unspecified fall, initial encounter: Secondary | ICD-10-CM | POA: Diagnosis not present

## 2020-10-23 DIAGNOSIS — R296 Repeated falls: Secondary | ICD-10-CM | POA: Diagnosis present

## 2020-10-23 DIAGNOSIS — W06XXXA Fall from bed, initial encounter: Secondary | ICD-10-CM | POA: Diagnosis present

## 2020-10-23 DIAGNOSIS — A0839 Other viral enteritis: Secondary | ICD-10-CM | POA: Diagnosis present

## 2020-10-23 DIAGNOSIS — N186 End stage renal disease: Secondary | ICD-10-CM | POA: Diagnosis present

## 2020-10-23 DIAGNOSIS — I442 Atrioventricular block, complete: Secondary | ICD-10-CM | POA: Diagnosis present

## 2020-10-23 DIAGNOSIS — E1151 Type 2 diabetes mellitus with diabetic peripheral angiopathy without gangrene: Secondary | ICD-10-CM | POA: Diagnosis present

## 2020-10-23 DIAGNOSIS — I2510 Atherosclerotic heart disease of native coronary artery without angina pectoris: Secondary | ICD-10-CM | POA: Diagnosis present

## 2020-10-23 DIAGNOSIS — E860 Dehydration: Secondary | ICD-10-CM | POA: Diagnosis present

## 2020-10-23 LAB — GLUCOSE, CAPILLARY
Glucose-Capillary: 64 mg/dL — ABNORMAL LOW (ref 70–99)
Glucose-Capillary: 64 mg/dL — ABNORMAL LOW (ref 70–99)
Glucose-Capillary: 64 mg/dL — ABNORMAL LOW (ref 70–99)
Glucose-Capillary: 98 mg/dL (ref 70–99)
Glucose-Capillary: 73 mg/dL (ref 70–99)
Glucose-Capillary: 120 mg/dL — ABNORMAL HIGH (ref 70–99)

## 2020-10-23 LAB — CBC
HCT: 35.2 % — ABNORMAL LOW (ref 39.0–52.0)
Hemoglobin: 11.2 g/dL — ABNORMAL LOW (ref 13.0–17.0)
MCH: 31.9 pg (ref 26.0–34.0)
MCHC: 31.8 g/dL (ref 30.0–36.0)
MCV: 100.3 fL — ABNORMAL HIGH (ref 80.0–100.0)
Platelets: 236 10*3/uL (ref 150–400)
RBC: 3.51 MIL/uL — ABNORMAL LOW (ref 4.22–5.81)
RDW: 15.8 % — ABNORMAL HIGH (ref 11.5–15.5)
WBC: 6.7 10*3/uL (ref 4.0–10.5)
nRBC: 0 % (ref 0.0–0.2)

## 2020-10-23 LAB — RENAL FUNCTION PANEL
Albumin: 2.8 g/dL — ABNORMAL LOW (ref 3.5–5.0)
Anion gap: 15 (ref 5–15)
BUN: 28 mg/dL — ABNORMAL HIGH (ref 8–23)
CO2: 22 mmol/L (ref 22–32)
Calcium: 8.6 mg/dL — ABNORMAL LOW (ref 8.9–10.3)
Chloride: 99 mmol/L (ref 98–111)
Creatinine, Ser: 7.25 mg/dL — ABNORMAL HIGH (ref 0.61–1.24)
GFR, Estimated: 7 mL/min — ABNORMAL LOW (ref >60)
Glucose, Bld: 79 mg/dL (ref 70–99)
Phosphorus: 3.8 mg/dL (ref 2.5–4.6)
Potassium: 3.7 mmol/L (ref 3.5–5.1)
Sodium: 136 mmol/L (ref 135–145)

## 2020-10-23 NOTE — TOC Progression Note (Signed)
Transition of Care [facility]) - Progression Note    Patient Details  Name: William [name] MRN: [id number] Date of Birth: 04/18/1941  [facility] of [facility] CM/SW Contact  Pollie Friar, RN Phone Number: 10/23/2020, 2:24 PM  Clinical Narrative:    Per CSW pt has no preference for [facility] services. CM has arranged [place] through [facility].  TOC following.   Expected Discharge Plan: [place] Barriers to Discharge: Continued Medical Work up  Expected Discharge Plan and Services Expected Discharge Plan: [place] Choice: [place] arrangements for the past 2 months: Single Family Home                                       Social Determinants of Health (SDOH) Interventions    Readmission Risk Interventions No flowsheet data found.

## 2020-10-23 NOTE — Care Management Obs Status (Signed)
[place] NOTIFICATION   Patient Details  Name: William [name] MRN: [id number] Date of Birth: 1941-01-14   Medicare Observation Status Notification Given:  Yes    [place], [place] 10/23/2020, 9:38 AM

## 2020-10-23 NOTE — Progress Notes (Signed)
[facility] Progress Note  Subjective:  Patient not examined today directly given COVID-19 + status, utilizing data taken from chart +/- discussions w/ providers and staff.   Had HD overnight w/ 1 L off, BP's were stable. Today VS stable, on RA. Labs show BUN 28, Cr 7.25, K+ wnl.   Vitals:   10/22/20 2312 10/22/20 2320 10/22/20 2351 10/23/20 0432  BP: 112/61 132/73 135/74 126/74  Pulse: 78 75 82 80  Resp:   16 18  Temp:  97.9 F (36.6 C) 98.5 F (36.9 C) 98 F (36.7 C)  TempSrc:  Axillary Oral Oral  SpO2: 98%  100% 100%  Weight:   58.1 kg 58.1 kg  Height:   5\' 6"  (1.676 m)     Exam:   Patient not examined today directly given COVID-19 + status, utilizing data taken from chart +/- discussions w/ providers and staff.      OP HD: MWF [facility]   4h  400/800  59.5kg  2/2 bath  Hep 4000  R thigh TDC  Mircera 60 mcg q2wks - last 10/16/20 Calcitriol 1.5mcg PO qHD  Assessment/Plan: 1.  COVID+ - minimal symptoms. Plan per primary 2. Falls/weakness - likely due to #1 & poor PO intake. Xray shoulder, CT head/neg with no acute findings. OT/PT eval.  3.  ESRD -  On HD MWF.  Missed Monday HD, had HD last night. Plan next HD tomorrow off schedule.  4.  Hypertension/volume  - BP variable. Not on antihypertensives.  5.  Anemia of CKD - Hgb 11. ESA recently dosed.  6.  Secondary Hyperparathyroidism -  Ca in goal. Phos elevated. Not currently on binder, start Renvela 1 AC TID.  7.  Nutrition - Renal diet w/fluid restrictions.  8. CAD, HFpEF, Hx complete heart block s/p pacemaker - continue home meds 9. DMT2 - diet controlled  Rob Kalik Hoare 10/23/2020, 11:06 AM   Recent Labs  Lab 10/22/20 0427 10/22/20 0537 10/23/20 0351  K  --  4.0 3.7  BUN  --  58* 28*  CREATININE  --  11.21* 7.25*  CALCIUM  --  8.2* 8.6*  PHOS  --  6.4* 3.8  HGB 11.0*  --  11.2*   Inpatient medications: . amiodarone  200 mg Oral Q M,W,F  . aspirin  81 mg Oral Daily  . calcitRIOL  1.25 mcg Oral Q  M,W,F-HD  . Chlorhexidine Gluconate Cloth  6 each Topical Q0600  . heparin  5,000 Units Subcutaneous Q8H  . pantoprazole  20 mg Oral Daily   . remdesivir 100 mg in NS 100 mL     loperamide

## 2020-10-23 NOTE — Progress Notes (Signed)
Family Medicine Teaching Service Daily Progress Note Intern Pager: [phone]  Patient name: William [name] Medical record number: [id number] Date of birth: 07-28-1941 Age: 80 y.o. Gender: male  Primary Care Provider: Matilde Haymaker, MD Consultants: Nephrology Code Status: Full  Pt Overview and Major Events to Date:  Admitted 10/22/20  Assessment and Plan: William [name] is a 80 y.o. male presenting with generalized weakness and multiple falls in the setting of COVID-19. PMH is significant for ESRD on HD MWF, CAD, HTN, DM, subdural hematoma, and nonsustained v-tach s/p pacemaker.  Generalized Weakness  Recent Falls Patient was still feeling fatigued this morning.  Indicated it was due to trouble sleeping.  May be due to COVID-19 illness.     - PT/OT recommending SNF. Patient currently declining  COVID-19 Afebrile.  No current symptoms concerning for COVID-19 outside of fatigue.  Satting well on RA.   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  Reach out to renal navigator about outpatient dialysis for COVID.  Got dialysis last night.   - Nephro following for Dialysis - Daily renal function panel - Continue home calcitriol and renal-vite    HTN BP well-controlled at 126/79.  Improved following dialysis.   - Continue to monitor  CAD  hx of v-tach s/p pacemaker Pacemaker interrogated and showed no events prior to hospitalization.  Home meds: ASA 81mg , Rosuvastatin 10mg  daily, Amiodarone 200mg  MWF -Continue home meds pending formal med rec -Cardiac telemetry  Diarrhea Improved with Loperamide. - Continue Loperamide 2 mg PRN  T2DM Last A1c on 09/08/2020 was 5.0. Glucose 78 on admission. Patient not on any home medications for diabetes. - CBG's with meals and nightly   FEN/GI: Renal Diet PPx: Heparin   Status is: Observation  The patient will require care spanning > 2 midnights and should be moved to  inpatient because: Unsafe d/c plan  Dispo: The patient is from: Home              Anticipated d/c is to: SNF              Anticipated d/c date is: 1 day              Patient currently is medically stable to d/c.   Subjective:  Patient was still feeling fatigued this morning.  Indicated it was due to trouble sleeping.   Objective: Temp:  [97.9 F (36.6 C)-98.5 F (36.9 C)] 98 F (36.7 C) (01/12 0432) Pulse Rate:  [63-90] 63 (01/12 1100) Resp:  [12-18] 15 (01/12 1100) BP: (112-152)/(56-108) 128/65 (01/12 0800) SpO2:  [98 %-100 %] 100 % (01/12 1100) Weight:  [58.1 kg] 58.1 kg (01/12 0432) Physical Exam:  Physical Exam Constitutional:      Appearance: Normal appearance.  HENT:     Head: Normocephalic.     Mouth/Throat:     Mouth: Mucous membranes are moist.  Cardiovascular:     Rate and Rhythm: Normal rate and regular rhythm.  Pulmonary:     Effort: Pulmonary effort is normal.     Breath sounds: Normal breath sounds.  Skin:    General: Skin is warm.  Neurological:     Mental Status: He is alert.    Laboratory: Recent Labs  Lab 10/21/20 1600 10/22/20 0427 10/23/20 0351  WBC 6.2 6.3 6.7  HGB 10.4* 11.0* 11.2*  HCT 32.2* 32.5* 35.2*  PLT 228 215 236   Recent Labs  Lab 10/21/20 1600 10/22/20 0537  10/23/20 0351  NA 129* 132* 136  K 4.0 4.0 3.7  CL 91* 97* 99  CO2 21* 19* 22  BUN 53* 58* 28*  CREATININE 11.28* 11.21* 7.25*  CALCIUM 8.6* 8.2* 8.6*  PROT 6.5  --   --   BILITOT 0.4  --   --   ALKPHOS 57  --   --   ALT 20  --   --   AST 29  --   --   GLUCOSE 78 71 79     Imaging/Diagnostic Tests: No new imaging  Delora Fuel, MD 10/23/2020, 3:08 PM PGY-1, [place] Intern pager: [phone], text pages welcome

## 2020-10-23 NOTE — Progress Notes (Signed)
Pt alert and oriented, no pain or SHOB. Vitals stable, received his first dose of Remdesivir. No HD today, probably tomorrow. CHG wipes provided, pt wanted to bathe himself in bed.

## 2020-10-23 NOTE — Plan of Care (Signed)
  Problem: Education: Goal: Knowledge of General Education information will improve Description Including pain rating scale, medication(s)/side effects and non-pharmacologic comfort measures Outcome: Progressing   

## 2020-10-23 NOTE — Progress Notes (Signed)
Patient will need to have dialysis in isolation for 21 days post positive COVID test. His clinic/[facility] dialyzes on TTS 3rd shift. He should arrive at 5:20pm and call when he gets in to the parking lot. He will be allowed in to the building when all negative patients from the previous shift have left.  The above has been explained to patient. He cannot utilize public transportation while COVID positive. He gives permission to call his daughter to discuss shift/transportation.  Patient's daughter/Benita is concerned that her dad is not strong enough to return home, but is understanding that she cannot force her father to go to SNF, nor does she want to do this. She states no issues with transporting him to 3rd shift HD because "I will just have to," but wants to know what services he will qualify for at home. Navigator has asked CSW/C. Kolar to call Benita to discuss. Navigator agreed to submit a request through [facility] to see if COVID transportation can be found for patient to alleviate this stress on patient's daughter. She was very grateful. This is not a guarantee and should NOT delay discharge. The clinic can follow up on request.  Primary updated and Navigator greatly appreciates communication regarding patient. Patient is set for outpatient HD at his clinic tomorrow, 10/24/20 at 5:20pm for his next treatment if discharged in time to go to this appointment. Navigator will follow to keep clinic updated.   Alphonzo Cruise, [place] Renal Navigator [phone]

## 2020-10-23 NOTE — TOC Progression Note (Signed)
Transition of Care [facility]) - Progression Note    Patient Details  Name: JAMILLE YOSHINO MRN: [id number] Date of Birth: Oct 31, 1940  Transition of Care [facility]) CM/SW [place], [place] Phone Number: 10/23/2020, 2:54 PM  Clinical Narrative:     CSW called daughter [name]. Provided information about [place] services and details on how home aides and ALF can be paid for or sought. CSW also provided information regarding  A pt's capacity to make decisions and when family members could make decisions for pt. Daughter expressed concern about her father's ability to care for himself at home and said that he frequently falls and is unable to bathe himself. Daughter plans to talk with her sisters and pt and try and convince him to pursue SNF.   TOC will continue to follow  Expected Discharge Plan: [place] Barriers to Discharge: Continued Medical Work up  Expected Discharge Plan and Services Expected Discharge Plan: [place] Choice: [place] arrangements for the past 2 months: Single Family Home                                       Social Determinants of Health (SDOH) Interventions    Readmission Risk Interventions No flowsheet data found.

## 2020-10-23 NOTE — TOC Initial Note (Signed)
Transition of Care [facility]) - Initial/Assessment Note    Patient Details  Name: William [name] MRN: [id number] Date of Birth: October 27, 1940  Transition of Care [facility]) CM/SW Contact:    William [name], William [name] Phone Number: 10/23/2020, 9:47 AM  Clinical Narrative:                  CSW called pt on room phone to discuss SNF recommendation. Pt stated he spoke with PT about recommendation until he was "red in the face" and they were "blue in the face." Pt states he does not want to go to SNF. He went to [facility] in the past over 1 year ago and had a negative experience. He wants to go home with [facility]. Pt lives home alone in [place]. His daughter William [name] is nearby. He had HH once in the past but does not know with what company and does not have a preference. Pt states he has a walker and a wheelchair. He does not want a 3 in 1. Pt states his daughter would take him at d/c.   Expected Discharge Plan: [place] Barriers to Discharge: Continued Medical Work up   Patient Goals and CMS Choice Patient states their goals for this hospitalization and ongoing recovery are:: To return home   Choice offered to / list presented to : Patient  Expected Discharge Plan and Services Expected Discharge Plan: [place] Acute Care Choice: [place] arrangements for the past 2 months: Single Family Home                                      Prior Living Arrangements/Services Living arrangements for the past 2 months: Single Family Home Lives with:: Self Patient language and need for interpreter reviewed:: Yes Do you feel safe going back to the place where you live?: Yes      Need for Family Participation in Patient Care: Yes (Comment) Care giver support system in place?: Yes (comment) Current home services: DME Criminal Activity/Legal Involvement Pertinent to Current Situation/Hospitalization: No - Comment as needed  Activities of Daily  Living Home Assistive Devices/Equipment: None ADL Screening (condition at time of admission) Patient's cognitive ability adequate to safely complete daily activities?: Yes Is the patient deaf or have difficulty hearing?: No Does the patient have difficulty seeing, even when wearing glasses/contacts?: No Does the patient have difficulty concentrating, remembering, or making decisions?: No Patient able to express need for assistance with ADLs?: Yes Does the patient have difficulty dressing or bathing?: No Independently performs ADLs?: Yes (appropriate for developmental age) Does the patient have difficulty walking or climbing stairs?: No Weakness of Legs: Both Weakness of Arms/Hands: Both  Permission Sought/Granted   Permission granted to share information with : Yes, Verbal Permission Granted  Share Information with NAME: William, [name] (Daughter)   [phone] [facility] Phone)           Emotional Assessment   Attitude/Demeanor/Rapport: Engaged   Orientation: : Oriented to Self,Oriented to Place,Oriented to  Time,Oriented to Situation Alcohol / Substance Use: Not Applicable Psych Involvement: No (comment)  Admission diagnosis:  Weakness [R53.1] Fall, initial encounter [W19.XXXA] Patient Active Problem List   Diagnosis Date Noted  . Pressure injury of skin 10/22/2020  . Weakness 10/22/2020  . Fall   . COVID-19 virus infection   . Transient loss of consciousness 09/08/2020  .  Hypovitaminosis D 02/06/2020  . PAD (peripheral artery disease) ([place]) 02/06/2020  . Complication of vascular dialysis catheter 11/28/2019  . Allergy, unspecified, initial encounter 07/31/2019  . 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  . Hospital discharge follow-up 04/11/2019  . 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 Encounter for removal of sutures 12/13/2018  . Iron deficiency anemia, unspecified 11/30/2018  . Uremia syndrome   . ESRD (end stage renal disease) on dialysis ([place]) 11/19/2018  . Malnutrition of moderate  degree 11/19/2018  . 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 Chronic systolic (congestive) heart failure ([place]) 11/18/2018  . Fever, unspecified 11/18/2018  . Other specified coagulation defects ([place]) 11/18/2018  . Pain, unspecified 11/18/2018  . Sarcoidosis, unspecified 11/18/2018  . Secondary hyperparathyroidism of renal origin ([place]) 11/18/2018  . Shortness of breath 11/18/2018  . Type 2 diabetes mellitus with diabetic peripheral angiopathy without gangrene ([place]) 11/18/2018  . Acute on chronic combined systolic and diastolic CHF (congestive heart failure) ([place])   . Abdominal pain   . Renal cyst 10/17/2018  . Ventricular tachycardia, non-sustained ([place])   . Decreased activities of daily living (ADL)   . Palliative care by specialist   . 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 Dehydration   . Gait instability 01/06/2018  . Type 2 diabetes mellitus without complication, without long-term current use of insulin ([place]) 10/30/2017  . Benign essential HTN 10/30/2017  . Hyperlipidemia 10/30/2017  . Ethmoid sinusitis 07/11/2015  . Subdural hematoma ([place]) 07/08/2015  . CAD (coronary artery disease) 10/26/2014  . 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   PCP:  William Haymaker, MD Pharmacy:   [facility] - [address] [place] [address] Phone: [phone] Fax: [place] [address], [place] mail [place] [place] [address] Phone: [phone] Fax: [phone]     Social Determinants of Health (SDOH) Interventions    Readmission Risk Interventions No flowsheet data found.

## 2020-10-23 NOTE — Progress Notes (Signed)
COVID transportation request submitted through [employer]. This should not delay discharge, as patient's daughter can transport and clinic can follow up on request.   Alphonzo Cruise, [place] Renal Navigator [phone]

## 2020-10-24 LAB — RENAL FUNCTION PANEL
Albumin: 2.7 g/dL — ABNORMAL LOW (ref 3.5–5.0)
Anion gap: 15 (ref 5–15)
BUN: 50 mg/dL — ABNORMAL HIGH (ref 8–23)
CO2: 23 mmol/L (ref 22–32)
Calcium: 8.3 mg/dL — ABNORMAL LOW (ref 8.9–10.3)
Chloride: 97 mmol/L — ABNORMAL LOW (ref 98–111)
Creatinine, Ser: 9.38 mg/dL — ABNORMAL HIGH (ref 0.61–1.24)
GFR, Estimated: 5 mL/min — ABNORMAL LOW (ref >60)
Glucose, Bld: 89 mg/dL (ref 70–99)
Phosphorus: 5.4 mg/dL — ABNORMAL HIGH (ref 2.5–4.6)
Potassium: 4.3 mmol/L (ref 3.5–5.1)
Sodium: 135 mmol/L (ref 135–145)

## 2020-10-24 LAB — CBC
HCT: 35.7 % — ABNORMAL LOW (ref 39.0–52.0)
Hemoglobin: 11.7 g/dL — ABNORMAL LOW (ref 13.0–17.0)
MCH: 31.9 pg (ref 26.0–34.0)
MCHC: 32.8 g/dL (ref 30.0–36.0)
MCV: 97.3 fL (ref 80.0–100.0)
Platelets: 304 10*3/uL (ref 150–400)
RBC: 3.67 MIL/uL — ABNORMAL LOW (ref 4.22–5.81)
RDW: 15.4 % (ref 11.5–15.5)
WBC: 5.3 10*3/uL (ref 4.0–10.5)
nRBC: 0 % (ref 0.0–0.2)

## 2020-10-24 LAB — HEPATITIS B SURFACE ANTIGEN: Hepatitis B Surface Ag: NONREACTIVE

## 2020-10-24 LAB — GLUCOSE, CAPILLARY
Glucose-Capillary: 80 mg/dL (ref 70–99)
Glucose-Capillary: 84 mg/dL (ref 70–99)
Glucose-Capillary: 62 mg/dL — ABNORMAL LOW (ref 70–99)
Glucose-Capillary: 147 mg/dL — ABNORMAL HIGH (ref 70–99)

## 2020-10-24 MED ORDER — HEPARIN SODIUM (PORCINE) 1000 UNIT/ML IJ SOLN
INTRAMUSCULAR | Status: AC
  Administered 2020-10-24: 1000 [IU]
  Filled 2020-10-24: qty 9

## 2020-10-24 MED ORDER — CALCITRIOL 0.25 MCG PO CAPS
ORAL_CAPSULE | ORAL | Status: AC
  Filled 2020-10-24: qty 1

## 2020-10-24 MED ORDER — CALCITRIOL 0.5 MCG PO CAPS
ORAL_CAPSULE | ORAL | Status: AC
  Filled 2020-10-24: qty 2

## 2020-10-24 NOTE — TOC Progression Note (Addendum)
Transition of Care [facility]) - Progression Note    Patient Details  Name: [name] MRN: [id number] Date of Birth: May 03, 1941  Transition of Care [facility]) CM/SW [place], [place] Phone Number: 10/24/2020, 9:22 AM  Clinical Narrative:     CSW called pt daughter [name]. Benita explained that her sister spoke with her father about going to SNF last night. Benita will be calling her father this AM  to reiterate. CSW explained pt decision is needed by 11am to determine dc plans and HD logistics.    1055: CSW called pt in room. Pt states that his daughter's are handling everything. CSW attempted to confirm that pt means he would be agreeable to SNF. Pt said no and that CSW was lying that his daughters wanted him to go to SNF. CSW asked if that meant pt would be going home. Pt said "I don't know what you mean by that, my daughters are handling everything." CSW attempted to clarify pt's plan. Pt was vague and unclear. CSW explained he would call Benita.   CSW called Benita and other daughter Rise Paganini was added to line. They explained that they had a conversation with pt and that he agreed to SNF. CSW explained that pt did not state that on most recent call. Daughters  Stated that pt works better with [facility].   Colleen agreed to speak with pt. She then notified CSW that pt was agreeable to SNF. CSW will work pt up for SNF.  CSW contacted the 2 covid facilities: [facility]: will not take HD patients currently.  [facility]: no covid beds available.   Expected Discharge Plan: SNF Services Barriers to Discharge: No HD + Covid beds available.  Expected Discharge Plan and Services Expected Discharge Plan: SNF     Post Acute Care Choice: [place] arrangements for the past 2 months: Single Family Home                                       Social Determinants of Health (SDOH) Interventions    Readmission Risk Interventions No flowsheet data found.

## 2020-10-24 NOTE — Progress Notes (Signed)
Renal Navigator greatly appreciates TOC involvement in assisting family in making timely discharge plans with patient and family. If patient will be discharged home today with  Hospital, he needs to go to his outpatient clinic tonight at 5:20pm for HD. If he decides to go to SNF, we will arrange COVID isolation HD here, but need to ensure we are only dialyzing patients in the hospital who cannot receive HD elsewhere at this time due to extremely high COVID positive HD patient volumes. Navigator alerted bedside RN to this. Navigator following closely.   Alphonzo Cruise, [place] Renal Navigator [phone]

## 2020-10-24 NOTE — Progress Notes (Addendum)
Family Medicine Teaching Service Daily Progress Note Intern Pager: [phone]  Patient name: William [name] Medical record number: [id number] Date of birth: [birth year] Age: 80 y.o. Gender: male  Primary Care Provider: Matilde Haymaker, MD Consultants: Nephrology Code Status: Full  Pt Overview and Major Events to Date:  Admitted 10/22/20  Assessment and Plan: William [name] a 80 y.o.malepresenting with generalized weakness and multiple falls in the setting of COVID-19. PMH is significant forESRD on HD MWF, CAD, HTN, DM, subdural hematoma,andnonsustained v-tach s/p pacemaker  Generalized Weakness  Recent Falls Patient was still feeling fatigued this morning.  Indicated it was due to trouble sleeping.  May be due to COVID-19 illness.  Patient now indicates he is open to going to SNF. - PT/OT following recommending SNF  COVID-19 Afebrile.  No current symptoms concerning for COVID-19 outside of fatigue.  Satting well on RA.  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  Patient to start receiving Dialysis T,TH, abd Sat now.  On schedule for dialysis later today. - Nephro following for Dialysis - Daily renal function panel - Continue home calcitriol and renal-vite  HTN BP well-controlled at 126/79.  Improved following dialysis.   - Continue to monitor  CAD hx of v-tach s/p pacemaker Pacemaker interrogated and showed no events prior to hospitalization.  Home meds: ASA 81mg ,Rosuvastatin 10mg  daily, Amiodarone 200mg  MWF -Continue home medspending formal med rec -Cardiac telemetry  Diarrhea Improved with Loperamide. - Continue Loperamide 2 mg PRN  T2DM Last A1c on 09/08/2020 was 5.0.Glucose 78 on admission. Patient not on any home medications for diabetes. - CBG's with meals and nightly  FEN/GI: Renal Diet PPx: Heparin   Status is: Inpatient  Remains inpatient appropriate because:Unsafe d/c  plan   Dispo: The patient is from: Home              Anticipated d/c is to: SNF              Anticipated d/c date is: 3 days              Patient currently is medically stable to d/c.    Subjective:  Patient indicates is feeling better this morning.  Denies any chest pain or difficulty breathing.  Now indicating he is fine with going to SNF.  Objective: Temp:  [98 F (36.7 C)-98.3 F (36.8 C)] 98.3 F (36.8 C) (01/13 1126) Pulse Rate:  [66-78] 69 (01/13 1126) Resp:  [14-21] 18 (01/13 1126) BP: (142-169)/(62-91) 164/91 (01/13 1126) SpO2:  [100 %] 100 % (01/13 1126) Weight:  [60.9 kg] 60.9 kg (01/13 0517) Physical Exam:  Physical Exam Constitutional:      Appearance: Normal appearance.  HENT:     Head: Normocephalic and atraumatic.     Mouth/Throat:     Mouth: Mucous membranes are moist.  Cardiovascular:     Rate and Rhythm: Normal rate and regular rhythm.  Pulmonary:     Effort: Pulmonary effort is normal.     Breath sounds: Normal breath sounds.  Skin:    General: Skin is warm.  Neurological:     General: No focal deficit present.     Mental Status: He is alert.  Psychiatric:        Mood and Affect: Mood normal.        Behavior: Behavior normal.     Laboratory: Recent Labs  Lab 10/21/20 1600 10/22/20 0427 10/23/20 0351  WBC 6.2 6.3 6.7  HGB 10.4*  11.0* 11.2*  HCT 32.2* 32.5* 35.2*  PLT 228 215 236   Recent Labs  Lab 10/21/20 1600 10/22/20 0537 10/23/20 0351  NA 129* 132* 136  K 4.0 4.0 3.7  CL 91* 97* 99  CO2 21* 19* 22  BUN 53* 58* 28*  CREATININE 11.28* 11.21* 7.25*  CALCIUM 8.6* 8.2* 8.6*  PROT 6.5  --   --   BILITOT 0.4  --   --   ALKPHOS 57  --   --   ALT 20  --   --   AST 29  --   --   GLUCOSE 78 71 79     Imaging/Diagnostic Tests: No new imaging  Delora Fuel, MD 10/24/2020, 2:08 PM PGY-1, [place] Intern pager: [phone], text pages welcome

## 2020-10-24 NOTE — Progress Notes (Addendum)
[facility] Progress Note  Subjective:  Seen in room. No SOB, cough or CP, no fevers, no n/v.     Vitals:   10/23/20 2000 10/24/20 0000 10/24/20 0517 10/24/20 0800  BP: (!) 148/62 (!) 169/83 (!) 145/65 (!) 142/83  Pulse: 69 76 78 66  Resp: 16 18 (!) 21 14  Temp: 98 F (36.7 C) 98.2 F (36.8 C) 98.1 F (36.7 C) 98.1 F (36.7 C)  TempSrc: Oral Oral Oral Oral  SpO2: 100% 100%  100%  Weight:   60.9 kg   Height:        Exam:   alert, nad   no jvd  Chest cta bilat to bases  Cor reg no RG  Abd soft ntnd no ascites   Ext no LE edema   Alert, NF, ox3   R thigh TDC    OP HD: MWF [facility]   4h  400/800  59.5kg  2/2 bath  Hep 4000  R thigh TDC  Mircera 60 mcg q2wks - last 10/16/20 Calcitriol 1.74mcg PO qHD  Assessment/Plan: 1.  COVID+ - minimal symptoms. Plan per primary 2. Falls/weakness - likely due to #1 & poor PO intake. Xray shoulder, CT head/neg with no acute findings. OT/PT eval.  3.  ESRD -  On HD MWF.  Covid schedule OP will be TTS so we are now doing him TTS. HD today. If able to dc home today, he can go to outpatient HD this evening.  4.  Hypertension/volume  - BP variable. Not on antihypertensives.  5.  Anemia of CKD - Hgb 11. ESA recently dosed.  6.  Secondary Hyperparathyroidism -  Ca in goal. Phos elevated. Not currently on binder, started Renvela 1 AC TID.  7.  Nutrition - Renal diet w/fluid restrictions.  8. CAD/ Hx complete heart block s/p pacemaker - continue home meds 9. DMT2 - diet controlled  Rob [place] Strieter 10/24/2020, 11:22 AM   Recent Labs  Lab 10/22/20 0427 10/22/20 0537 10/23/20 0351  K  --  4.0 3.7  BUN  --  58* 28*  CREATININE  --  11.21* 7.25*  CALCIUM  --  8.2* 8.6*  PHOS  --  6.4* 3.8  HGB 11.0*  --  11.2*   Inpatient medications: . amiodarone  200 mg Oral Q M,W,F  . aspirin  81 mg Oral Daily  . calcitRIOL  1.25 mcg Oral Q M,W,F-HD  . Chlorhexidine Gluconate Cloth  6 each Topical Q0600  . heparin  5,000 Units Subcutaneous  Q8H  . pantoprazole  20 mg Oral Daily   . remdesivir 100 mg in NS 100 mL 100 mg (10/23/20 1149)   loperamide

## 2020-10-24 NOTE — NC FL2 (Signed)
[place] LEVEL OF CARE SCREENING TOOL     IDENTIFICATION  Patient Name: William [name] Birthdate: 1941/01/05 Sex: male Admission Date (Current Location): 10/21/2020  [facility] [place] Number:      Facility and Address:  [facility], [place] [address] [place], [place] [address]      Provider Number: [id number]  Attending Physician Name and Address:  Lind Covert, MD  Relative Name and Phone Number:  Madyx, Delfin (Daughter)   [phone] [facility] Phone)    Current Level of Care: Hospital Recommended Level of Care: [place] Prior Approval Number:    Date Approved/Denied:   PASRR Number: [id number]  Discharge Plan: SNF    Current Diagnoses: Patient Active Problem List   Diagnosis Date Noted  . General weakness 10/23/2020  . Pressure injury of skin 10/22/2020  . Weakness 10/22/2020  . Fall   . COVID-19 virus infection   . Transient loss of consciousness 09/08/2020  . Hypovitaminosis D 02/06/2020  . PAD (peripheral artery disease) ([place]) 02/06/2020  . Complication of vascular dialysis catheter 11/28/2019  . Allergy, unspecified, initial encounter 07/31/2019  . 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  . Hospital discharge follow-up 04/11/2019  . 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 Encounter for removal of sutures 12/13/2018  . Iron deficiency anemia, unspecified 11/30/2018  . Uremia syndrome   . ESRD (end stage renal disease) on dialysis ([place]) 11/19/2018  . Malnutrition of moderate degree 11/19/2018  . 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 Chronic systolic (congestive) heart failure ([place]) 11/18/2018  . Fever, unspecified 11/18/2018  . Other specified coagulation defects ([place]) 11/18/2018  . Pain, unspecified 11/18/2018  . Sarcoidosis, unspecified 11/18/2018  . Secondary hyperparathyroidism of renal origin ([place]) 11/18/2018  . Shortness of breath 11/18/2018  . Type 2 diabetes mellitus with diabetic peripheral  angiopathy without gangrene ([place]) 11/18/2018  . Acute on chronic combined systolic and diastolic CHF (congestive heart failure) ([place])   . Abdominal pain   . Renal cyst 10/17/2018  . Ventricular tachycardia, non-sustained ([place])   . Decreased activities of daily living (ADL)   . Palliative care by specialist   . 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 Dehydration   . Gait instability 01/06/2018  . Type 2 diabetes mellitus without complication, without long-term current use of insulin ([place]) 10/30/2017  . Benign essential HTN 10/30/2017  . Hyperlipidemia 10/30/2017  . Ethmoid sinusitis 07/11/2015  . Subdural hematoma ([place]) 07/08/2015  . CAD (coronary artery disease) 10/26/2014  . 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    Orientation RESPIRATION BLADDER Height & Weight     Self,Time,Situation,Place  Normal Continent Weight: 134 lb 4.2 oz (60.9 kg) Height:  5\' 6"  (167.6 cm)  BEHAVIORAL SYMPTOMS/MOOD NEUROLOGICAL BOWEL NUTRITION STATUS      Continent Diet (see d/c summary)  AMBULATORY STATUS COMMUNICATION OF NEEDS Skin   Extensive Assist Verbally PU Stage and Appropriate Care (Pressure injury, sacrum, stage 2)                       Personal Care Assistance Level of Assistance  Bathing,Feeding,Dressing Bathing Assistance: Limited assistance Feeding assistance: Independent Dressing Assistance: Limited assistance     Functional Limitations Info  Sight,Speech,Hearing Sight Info: Adequate Hearing Info: Adequate Speech Info: Adequate    SPECIAL CARE FACTORS FREQUENCY  PT (By licensed PT),OT (By licensed OT)     PT Frequency: 5x/week OT Frequency: 5x/week  Contractures      Additional Factors Info  Code Status,Allergies Code Status Info: Full code Allergies Info: Carvedilol           Current Medications (10/24/2020):  This is the current hospital active medication list Current Facility-Administered  Medications  Medication Dose Route Frequency Provider Last Rate Last Admin  . amiodarone (PACERONE) tablet 200 mg  200 mg Oral Q M,W,F Autry-Lott, Simone, DO   200 mg at 10/23/20 1004  . aspirin chewable tablet 81 mg  81 mg Oral Daily Autry-Lott, Simone, DO   81 mg at 10/24/20 1132  . calcitRIOL (ROCALTROL) capsule 1.25 mcg  1.25 mcg Oral Q M,W,F-HD Autry-Lott, Simone, DO      . Chlorhexidine Gluconate Cloth 2 % PADS 6 each  6 each Topical Q0600 [name], MD   6 each at 10/24/20 0622  . heparin injection 5,000 Units  5,000 Units Subcutaneous Q8H [name], MD   5,000 Units at 10/24/20 0622  . loperamide (IMODIUM) capsule 2 mg  2 mg Oral PRN [name], MD   2 mg at 10/22/20 1427  . pantoprazole (PROTONIX) EC tablet 20 mg  20 mg Oral Daily Autry-Lott, Simone, DO   20 mg at 10/24/20 1132     Discharge Medications: Please see discharge summary for a list of discharge medications.  Relevant Imaging Results:  Relevant Lab Results:   Additional Information ss#[id number]. Dialysis [place] [place], [place]

## 2020-10-24 NOTE — Progress Notes (Signed)
Physical Therapy Treatment Patient Details Name: William [name] MRN: [id number] DOB: 11/04/1940 Today's Date: 10/24/2020    History of Present Illness 80 y.o. 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    PT Comments    On arrival to room pt with urine soaked bed linins and gown. Pt sat EOB for gown change. Transport arrived to take pt to HD and pt stood EOB while RN changed bed linins. Pt fatigued quickly at EOB and appeared unsteady when taking side steps to [facility]. Will continue to follow for mobility progression.    Follow Up Recommendations  SNF;Supervision/Assistance - 24 hour (pt likely to refuse; will need max HH services)     Equipment Recommendations  3in1 (PT)    Recommendations for Other Services       Precautions / Restrictions Precautions Precautions: Fall Precaution Comments: R femoral permanent HD catheter Restrictions Weight Bearing Restrictions: No    Mobility  Bed Mobility Overal bed mobility: Needs Assistance Bed Mobility: Supine to Sit;Sit to Supine     Supine to sit: Min assist Sit to supine: Min guard   General bed mobility comments: Min A for trunk A to come to sitting.  Transfers Overall transfer level: Needs assistance Equipment used: None Transfers: Sit to/from [facility] Sit to Stand: Min assist         General transfer comment: Min a to stand. Pt's trunk remained flexed with LE against bed for support.  Ambulation/Gait             General Gait Details: deffered as transport had arrived to take pt to HD   Stairs             Wheelchair Mobility    Modified Rankin (Stroke Patients Only)       Balance Overall balance assessment: History of Falls;Needs assistance Sitting-balance support: No upper extremity  supported Sitting balance-Leahy Scale: Fair     Standing balance support: No upper extremity supported Standing balance-Leahy Scale: Fair                              Cognition Arousal/Alertness: Awake/alert Behavior During Therapy: WFL for tasks assessed/performed Overall Cognitive Status: Impaired/Different from baseline Area of Impairment: Attention;Memory;Awareness                   Current Attention Level: Sustained Memory: Decreased recall of precautions;Decreased short-term memory     Awareness: Emergent   General Comments: Pt requires increased amount of time for processing any information. Unable to recall information after 5 minute delay.      Exercises      General Comments        Pertinent Vitals/Pain Pain Assessment: No/denies pain    Home Living                      Prior Function            PT Goals (current goals can now be found in the care plan section) Acute Rehab PT Goals Patient Stated Goal: to go home PT Goal Formulation: With patient Time For Goal Achievement: 11/05/20 Potential to Achieve Goals: Good Progress towards PT goals: Progressing toward goals    Frequency    Min 3X/week      PT  Plan Current plan remains appropriate    Co-evaluation              AM-PAC PT "6 Clicks" Mobility   Outcome Measure  Help needed turning from your back to your side while in a flat bed without using bedrails?: A Little Help needed moving from lying on your back to sitting on the side of a flat bed without using bedrails?: A Little Help needed moving to and from a bed to a chair (including a wheelchair)?: A Little Help needed standing up from a chair using your arms (e.g., wheelchair or bedside chair)?: A Little Help needed to walk in hospital room?: A Little Help needed climbing 3-5 steps with a railing? : A Lot 6 Click Score: 17    End of Session   Activity Tolerance: Patient tolerated treatment  well Patient left: in bed;with call bell/phone within reach;with nursing/sitter in room (transport present for take pt to HD) Nurse Communication: Mobility status PT Visit Diagnosis: Unsteadiness on feet (R26.81);Muscle weakness (generalized) (M62.81)     Time: 5859-2924 PT Time Calculation (min) (ACUTE ONLY): 21 min  Charges:  $Therapeutic Activity: 8-22 mins                    Benjiman Core, [place] Pager [phone] Acute Rehab   Allena Katz 10/24/2020, 2:23 PM

## 2020-10-24 NOTE — Progress Notes (Signed)
OT Cancellation Note  Patient Details Name: William [name] MRN: [id number] DOB: [birth year]   Cancelled Treatment:    Reason Eval/Treat Not Completed: Patient at procedure or test/ unavailable.  Off the floor for dialysis.  Continue efforts.    Markea Ruzich D Demitria Hay 10/24/2020, 3:10 PM

## 2020-10-24 NOTE — Progress Notes (Signed)
Renal Navigator received call from [facility] CSW/C. Yisroel Ramming that patient was not entirely cooperating with him and that his daughters were still hopeful that they would be able to convince him of the need for short term rehab in SNF.  Navigator contacted patient's daughter [name], who states she was on the phone with her father and sister Rise Paganini. She asked if Navigator would speak to her father about agreeing to SNF, as her father seems to respond positively to Navigator.  Navigator got on the phone with patient and daughter Rise Paganini and [name] dropped off the call. Navigator explained to patient that although we all know that he is not in favor of discharging to SNF, it has been the recommendation made, and we all want what is in his best interest, further explaining that the goal of short term rehab is to hopefully increase his strength so that he can eventually return home stronger than he is currently and continue to live independently. Navigator noted to patient that we are concerned about his recent falls. He acknowledged this and confirmed that what Navigator was saying in true. He states that this decision is in the hands of his children now and that if this is what they want, then he is agreeable. Navigator informed patient that [name] stated yesterday that she would like her father to agree to SNF and Rise Paganini said it while we were all on the phone. She then said that she will be involved in choosing the facility, at which point Navigator was clear that although SNF is recommended, the fact that patient has tested positive for COVID will considerably limit his options. Navigator explained that Cyrus/CSW and his team will assist in searching for SNF and follow up with information. Navigator commends patient for taking the recommendations of team and family, even though it's not what he wants to do.  Patient will be dialyzed in the hospital today as able to be fit in to the schedule. Navigator continues to be  available for support and assistance as needed.  Alphonzo Cruise, [place] Renal Navigator [phone]

## 2020-10-25 LAB — RENAL FUNCTION PANEL
Albumin: 2.5 g/dL — ABNORMAL LOW (ref 3.5–5.0)
Anion gap: 15 (ref 5–15)
BUN: 33 mg/dL — ABNORMAL HIGH (ref 8–23)
CO2: 22 mmol/L (ref 22–32)
Calcium: 8.7 mg/dL — ABNORMAL LOW (ref 8.9–10.3)
Chloride: 98 mmol/L (ref 98–111)
Creatinine, Ser: 7.03 mg/dL — ABNORMAL HIGH (ref 0.61–1.24)
GFR, Estimated: 7 mL/min — ABNORMAL LOW (ref >60)
Glucose, Bld: 109 mg/dL — ABNORMAL HIGH (ref 70–99)
Phosphorus: 4.8 mg/dL — ABNORMAL HIGH (ref 2.5–4.6)
Potassium: 3.9 mmol/L (ref 3.5–5.1)
Sodium: 135 mmol/L (ref 135–145)

## 2020-10-25 LAB — GLUCOSE, CAPILLARY
Glucose-Capillary: 97 mg/dL (ref 70–99)
Glucose-Capillary: 121 mg/dL — ABNORMAL HIGH (ref 70–99)
Glucose-Capillary: 163 mg/dL — ABNORMAL HIGH (ref 70–99)
Glucose-Capillary: 77 mg/dL (ref 70–99)

## 2020-10-25 MED ORDER — SEVELAMER CARBONATE 800 MG PO TABS
800.0000 mg | ORAL_TABLET | Freq: Three times a day (TID) | ORAL | Status: DC
  Administered 2020-10-25 – 2020-10-29 (×8): 800 mg via ORAL
  Filled 2020-10-25 (×11): qty 1

## 2020-10-25 MED ORDER — SEVELAMER CARBONATE 800 MG PO TABS: 800.0000 mg | ORAL_TABLET | Freq: Three times a day (TID) | ORAL | Status: DC

## 2020-10-25 MED ORDER — CALCITRIOL 0.5 MCG PO CAPS
1.2500 ug | ORAL_CAPSULE | ORAL | Status: DC
  Administered 2020-10-26: 1.25 ug via ORAL
  Filled 2020-10-25: qty 1

## 2020-10-25 NOTE — Progress Notes (Signed)
[facility] Progress Note  Subjective:   Patient not examined today directly given COVID-19 + status, utilizing data taken from chart +/- discussions w/ providers and staff.     Vitals:   10/25/20 0356 10/25/20 0748 10/25/20 0957 10/25/20 1116  BP: (!) 144/75  130/71 (!) 144/73  Pulse: 86   75  Resp: 20   19  Temp: 98.6 F (37 C) 98.4 F (36.9 C) 98.2 F (36.8 C) 97.9 F (36.6 C)  TempSrc: Oral Oral Oral Oral  SpO2: 98%   100%  Weight:      Height:        Exam:  Patient not examined today directly given COVID-19 + status, utilizing data taken from chart +/- discussions w/ providers and staff.      OP HD: MWF [facility]   4h  400/800  59.5kg  2/2 bath  Hep 4000  R thigh TDC  Mircera 60 mcg q2wks - last 10/16/20 Calcitriol 1.25mcg PO qHD  Assessment/Plan: 1.  COVID+ - minimal symptoms. Plan per primary 2. Falls/weakness - likely due to #1 & poor PO intake. Xray shoulder, CT head/neg with no acute findings. OT/PT eval.  3.  ESRD -  On HD MWF.  Covid schedule OP will be TTS so we are now doing him TTS here. HD tomorrow.  4.  Hypertension/volume  - BP variable. Not on antihypertensives.  5.  Anemia of CKD - Hgb 11. ESA recently dosed.  6.  Secondary Hyperparathyroidism -  Ca in goal. Phos elevated. Not currently on binder, started Renvela 1 AC TID.  7.  Nutrition - Renal diet w/fluid restrictions.  8. CAD/ Hx complete heart block s/p pacemaker - continue home meds 9. DMT2 - diet controlled 10. Dispo - pending SNF placement  William [name] 10/25/2020, 12:30 PM   Recent Labs  Lab 10/23/20 0351 10/24/20 1303 10/25/20 0454  K 3.7 4.3 3.9  BUN 28* 50* 33*  CREATININE 7.25* 9.38* 7.03*  CALCIUM 8.6* 8.3* 8.7*  PHOS 3.8 5.4* 4.8*  HGB 11.2* 11.7*  --    Inpatient medications: . amiodarone  200 mg Oral Q M,W,F  . aspirin  81 mg Oral Daily  . calcitRIOL  1.25 mcg Oral Q M,W,F-HD  . Chlorhexidine Gluconate Cloth  6 each Topical Q0600  . heparin  5,000 Units  Subcutaneous Q8H  . pantoprazole  20 mg Oral Daily  . sevelamer carbonate  800 mg Oral TID WC    loperamide

## 2020-10-25 NOTE — Progress Notes (Signed)
Physical Therapy Treatment Patient Details Name: William [name] MRN: [id number] DOB: 1941/03/19 Today's Date: 10/25/2020    History of Present Illness 80 y.o. 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    PT Comments    Pt supine in bed on arrival.  Focus of session progressiong gt distance and endurance.  Pt is progressing well at this time.  Plan for trial of stair training next session.     Follow Up Recommendations  SNF;Supervision/Assistance - 24 hour (may progress to HHPT)     Equipment Recommendations  3in1 (PT)    Recommendations for Other Services       Precautions / Restrictions Precautions Precautions: Fall Precaution Comments: R femoral permanent HD catheter Restrictions Weight Bearing Restrictions: No    Mobility  Bed Mobility Overal bed mobility: Needs Assistance Bed Mobility: Supine to Sit;Sit to Supine     Supine to sit: Supervision Sit to supine: Supervision      Transfers Overall transfer level: Needs assistance Equipment used: Rolling walker (2 wheeled) Transfers: Sit to/from Stand Sit to Stand: Min guard         General transfer comment: Min guard for safety  Ambulation/Gait Ambulation/Gait assistance: Supervision Gait Distance (Feet): 80 Feet (laps in room) Assistive device: None Gait Pattern/deviations: Step-through pattern;Decreased stride length     General Gait Details: Cues for upper trunk control and pacing.  Mild DOE noted, vitals stable, reports pain in L foot.   Stairs             Wheelchair Mobility    Modified Rankin (Stroke Patients Only)       Balance Overall balance assessment: History of Falls;Needs assistance   Sitting balance-Leahy Scale: Fair       Standing balance-Leahy Scale: Fair                               Cognition Arousal/Alertness: Awake/alert Behavior During Therapy: WFL for tasks assessed/performed Overall Cognitive Status: Within Functional Limits for tasks assessed                                        Exercises      General Comments        Pertinent Vitals/Pain Pain Assessment: 0-10 Pain Score: 6  Pain Location: L foot from bunion Pain Descriptors / Indicators: Discomfort;Sore Pain Intervention(s): Monitored during session;Repositioned    Home Living                      Prior Function            PT Goals (current goals can now be found in the care plan section) Acute Rehab PT Goals Patient Stated Goal: to go home Potential to Achieve Goals: Good Progress towards PT goals: Progressing toward goals    Frequency    Min 3X/week      PT Plan Current plan remains appropriate    Co-evaluation              AM-PAC PT "6 Clicks" Mobility   Outcome Measure  Help needed turning from your back to your side while in a flat bed without using bedrails?: A Little Help needed  moving from lying on your back to sitting on the side of a flat bed without using bedrails?: A Little Help needed moving to and from a bed to a chair (including a wheelchair)?: A Little Help needed standing up from a chair using your arms (e.g., wheelchair or bedside chair)?: A Little Help needed to walk in hospital room?: A Little Help needed climbing 3-5 steps with a railing? : A Little 6 Click Score: 18    End of Session Equipment Utilized During Treatment: Gait belt Activity Tolerance: Patient tolerated treatment well Patient left: in bed;with call bell/phone within reach;with nursing/sitter in room Nurse Communication: Mobility status PT Visit Diagnosis: Unsteadiness on feet (R26.81);Muscle weakness (generalized) (M62.81)     Time: 5027-7412 PT Time Calculation (min) (ACUTE ONLY): 30 min  Charges:  $Gait Training: 23-37 mins                      William [name] , PTA Acute Rehabilitation Services Pager [phone] Office [phone]     William [name] 10/25/2020, 6:12 PM

## 2020-10-25 NOTE — Progress Notes (Signed)
William [name] has no HD beds available today.  [facility] has no HD beds available today.

## 2020-10-25 NOTE — Progress Notes (Signed)
Family Medicine Teaching Service Daily Progress Note Intern Pager: [phone]  Patient name: William [name] Medical record number: [id number] Date of birth: Dec 04, 1940 Age: 80 y.o. Gender: male  Primary Care Provider: Matilde Haymaker, MD Consultants: Nephro Code Status: Full  Pt Overview and Major Events to Date:  Admitted 10/22/20  Assessment and Plan: LEJUAN BOTTO a 80 y.o.malepresenting with generalized weakness and multiple falls in the setting of COVID-19. 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  Patient medically stable to D/C to SNF.   Generalized Weakness  Recent Falls Patient fatigued and weak when working with PT/OT.  Likely due to chronic disease as family reporting has been issue prior to recent illness.  Overall energy improved. - Continue to work with PT/OT - D/C to SNF   COVID-19 Afebrile.  No current symptoms concerning for COVID-19 outside of fatigue.  Satting well on RA.  S/p 3 days Remdesevir.  Will obtain Antigen test as patient is 5 days post positive test and has not had O2 requirement.  Has also been afebrile for 24 hours. -Contact/airborne precautions -PT/OT  ESRD on HD MWF  Anemia of CKD K-3.9.   Phos-4.8. Reach out to renal navigator about outpatient dialysis for COVID.  Got dialysis last night.   - Nephro following, appreciate recs - Daily renal function panel - Continue home calcitriol - Start Renvela 800 mg TID perNephro recs given high Phos  HTN BP well-controlled at 130/71. Improved following dialysis.  - Continue to monitor  CAD hx of v-tach s/p pacemaker Pacemaker interrogated and showed no events prior to hospitalization.Home meds: ASA 81mg , Amiodarone 200mg  MWF. Will check with Nephro if needs to be changed based on dialysis schedule. -Continue home Amiodarone , Aspirin -Cardiac telemetry  Diarrhea Improved with Loperamide. - Continue Loperamide 2 mg  PRN  T2DM Last A1c on 09/08/2020 was 5.0.Glucose currently stable. Patient not on any home medications for diabetes. - CBG's with meals and nightly  FEN/GI:Renal Diet BPZ:WCHENID   Status is: Inpatient  Remains inpatient appropriate because:Unsafe d/c plan   Dispo: The patient is from: Home              Anticipated d/c is to: SNF              Anticipated d/c date is: > 3 days              Patient currently is medically stable to d/c.    Subjective:  Patient indicates overall is feeling well and has good appetite.  Requesting chamnge of bed sheet due to being wet.  Objective: Temp:  [97.5 F (36.4 C)-98.6 F (37 C)] 98.4 F (36.9 C) (01/14 0748) Pulse Rate:  [64-94] 86 (01/14 0356) Resp:  [18-20] 20 (01/14 0356) BP: (117-164)/(75-94) 144/75 (01/14 0356) SpO2:  [98 %-100 %] 98 % (01/14 0356) Weight:  [56.9 kg-57.9 kg] 56.9 kg (01/13 1705) Physical Exam:  Physical Exam Constitutional:      General: He is not in acute distress. HENT:     Head: Normocephalic and atraumatic.     Mouth/Throat:     Mouth: Mucous membranes are moist.  Cardiovascular:     Rate and Rhythm: Normal rate and regular rhythm.  Pulmonary:     Effort: Pulmonary effort is normal.     Breath sounds: Normal breath sounds.  Neurological:     Mental Status: He is alert.     Laboratory: Recent Labs  Lab 10/22/20 0427 10/23/20  0351 10/24/20 1303  WBC 6.3 6.7 5.3  HGB 11.0* 11.2* 11.7*  HCT 32.5* 35.2* 35.7*  PLT 215 236 304   Recent Labs  Lab 10/21/20 1600 10/22/20 0537 10/23/20 0351 10/24/20 1303 10/25/20 0454  NA 129*   < > 136 135 135  K 4.0   < > 3.7 4.3 3.9  CL 91*   < > 99 97* 98  CO2 21*   < > 22 23 22   BUN 53*   < > 28* 50* 33*  CREATININE 11.28*   < > 7.25* 9.38* 7.03*  CALCIUM 8.6*   < > 8.6* 8.3* 8.7*  PROT 6.5  --   --   --   --   BILITOT 0.4  --   --   --   --   ALKPHOS 57  --   --   --   --   ALT 20  --   --   --   --   AST 29  --   --   --   --   GLUCOSE 78    < > 79 89 109*   < > = values in this interval not displayed.   Phos-4.8  Imaging/Diagnostic Tests: No new imaging  Delora Fuel, MD 10/25/2020, 9:36 AM PGY-1, [place] Intern pager: [phone], text pages welcome

## 2020-10-26 LAB — GLUCOSE, CAPILLARY
Glucose-Capillary: 112 mg/dL — ABNORMAL HIGH (ref 70–99)
Glucose-Capillary: 135 mg/dL — ABNORMAL HIGH (ref 70–99)
Glucose-Capillary: 136 mg/dL — ABNORMAL HIGH (ref 70–99)
Glucose-Capillary: 160 mg/dL — ABNORMAL HIGH (ref 70–99)
Glucose-Capillary: 201 mg/dL — ABNORMAL HIGH (ref 70–99)
Glucose-Capillary: 68 mg/dL — ABNORMAL LOW (ref 70–99)
Glucose-Capillary: 71 mg/dL (ref 70–99)

## 2020-10-26 LAB — RENAL FUNCTION PANEL
Albumin: 2.5 g/dL — ABNORMAL LOW (ref 3.5–5.0)
Anion gap: 14 (ref 5–15)
BUN: 47 mg/dL — ABNORMAL HIGH (ref 8–23)
CO2: 23 mmol/L (ref 22–32)
Calcium: 8.9 mg/dL (ref 8.9–10.3)
Chloride: 97 mmol/L — ABNORMAL LOW (ref 98–111)
Creatinine, Ser: 8.72 mg/dL — ABNORMAL HIGH (ref 0.61–1.24)
GFR, Estimated: 6 mL/min — ABNORMAL LOW (ref >60)
Glucose, Bld: 116 mg/dL — ABNORMAL HIGH (ref 70–99)
Phosphorus: 4.9 mg/dL — ABNORMAL HIGH (ref 2.5–4.6)
Potassium: 3.9 mmol/L (ref 3.5–5.1)
Sodium: 134 mmol/L — ABNORMAL LOW (ref 135–145)

## 2020-10-26 LAB — SARS CORONAVIRUS 2 (TAT 6-24 HRS): SARS Coronavirus 2: POSITIVE — AB

## 2020-10-26 NOTE — Progress Notes (Signed)
Family Medicine Teaching Service Daily Progress Note Intern Pager: [phone]  Patient name: William [name] Medical record number: [id number] Date of birth: [birth year] Age: 80 y.o. Gender: male  Primary Care Provider: Matilde Haymaker, MD Consultants: Nephro Code Status: FULL  Pt Overview and Major Events to Date:  10/22/20: admitted 10/24/20: medically stable for discharge  Assessment and Plan:  RAYMONDO GARCIALOPEZ a 80 y.o.malewho presented with generalized weakness and multiple falls in the setting of COVID-19. 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  Patient is medically stable for discharge to SNF.  Generalized Weakness  Recent Falls Ongoing generalized weakness and decreased endurance when working with PT yesterday per their note. -Continue PT/OT -Appreciate TOC assistance with SNF placement  COVID-19 Remains stable on room air without symptoms. Afebrile >24h. Repeat test obtained 10/25/20 was positive. -s/p Remdesevir x3 days (1/12-1/14) -Contact/airborne precautions  ESRD on HD MWF  Anemia of CKD Last received dialysis on 1/13 (now on T/Th/S schedule due to COVID+ status) -Nephro following, appreciate assistance -Plan for HD today -Continue Renvela and Calcitriol per nephro -Monitor renal function panel  CAD  Hx of v-tach s/p pacemaker Chronic, stable. Pacemaker was interrogated and showed no events. -Continue home ASA and amiodarone  T2DM Last A1c 5.0 on 09/08/2020. Not on any home medications. CBG 71 this morning. -Consider decreasing frequency of CBG monitoring   FEN/GI: Renal diet PPx: Heparin   Status is: Inpatient Remains inpatient appropriate because:awaiting SNF placement  Dispo: The patient is from: Home              Anticipated d/c is to: SNF              Patient currently is medically stable to d/c.    Subjective:  No acute events overnight. Patient is eating more this  morning- he has found a vanilla milkshake that he enjoys immensely. Otherwise feels well- no cough, SOB or other symptoms.   Objective: Temp:  [97.9 F (36.6 C)-98.6 F (37 C)] 98.5 F (36.9 C) (01/15 0016) Pulse Rate:  [74-86] 74 (01/14 1645) Resp:  [16-20] 16 (01/15 0016) BP: (130-144)/(61-84) 131/61 (01/15 0016) SpO2:  [98 %-100 %] 100 % (01/14 1645) Weight:  [58.2 kg] 58.2 kg (01/15 0016) Physical Exam: General: alert, elderly male, NAD Cardiovascular: RRR, normal S1/S2 without m/r/g-- pacemaker in place Respiratory: normal WOB on room air, lungs CTAB Abdomen: soft, nontender, nondistended Extremities: no peripheral edema Neuro: grossly intact, 5/5 strength in all four extremities, oriented x3, normal speech  Laboratory: Recent Labs  Lab 10/22/20 0427 10/23/20 0351 10/24/20 1303  WBC 6.3 6.7 5.3  HGB 11.0* 11.2* 11.7*  HCT 32.5* 35.2* 35.7*  PLT 215 236 304   Recent Labs  Lab 10/21/20 1600 10/22/20 0537 10/23/20 0351 10/24/20 1303 10/25/20 0454  NA 129*   < > 136 135 135  K 4.0   < > 3.7 4.3 3.9  CL 91*   < > 99 97* 98  CO2 21*   < > 22 23 22   BUN 53*   < > 28* 50* 33*  CREATININE 11.28*   < > 7.25* 9.38* 7.03*  CALCIUM 8.6*   < > 8.6* 8.3* 8.7*  PROT 6.5  --   --   --   --   BILITOT 0.4  --   --   --   --   ALKPHOS 57  --   --   --   --  ALT 20  --   --   --   --   AST 29  --   --   --   --   GLUCOSE 78   < > 79 89 109*   < > = values in this interval not displayed.    Imaging/Diagnostic Tests: No new imaging/diagnostic tests.  [name], MD 10/26/2020, 2:50 AM PGY-1, [place] Intern pager: [phone], text pages welcome

## 2020-10-26 NOTE — TOC Progression Note (Signed)
Transition of Care [facility]) - Progression Note    Patient Details  Name: William [name] MRN: [id number] Date of Birth: December 06, 1940  Transition of Care [facility]) CM/SW [place], [place] Phone Number: [phone] 10/26/2020, 2:39 PM  Clinical Narrative:     CSW checked hub and pt has no other bed offers.  TOC team will continue to assist with discharge planning needs.   Expected Discharge Plan: [place] Barriers to Discharge: Continued Medical Work up  Expected Discharge Plan and Services Expected Discharge Plan: [place] Choice: [place] arrangements for the past 2 months: Single Family Home                                       Social Determinants of Health (SDOH) Interventions    Readmission Risk Interventions No flowsheet data found.

## 2020-10-26 NOTE — Progress Notes (Signed)
Due to high pt census pt will not get dialysis today or tonight, HD will be postponed until tomorrow.   Kelly Splinter, MD 10/26/2020, 7:35 PM

## 2020-10-26 NOTE — Progress Notes (Signed)
[facility] Progress Note  Subjective:   Pt seen in room, looks in good spirits, walked w/ RN to the BR today. Sacral pain improving some.  No SOB.    Vitals:   10/25/20 1645 10/26/20 0016 10/26/20 0906 10/26/20 1117  BP: (!) 142/84 131/61 (!) 141/77 (!) 152/76  Pulse: 74  79 68  Resp: 20 16 18 17   Temp: 98 F (36.7 C) 98.5 F (36.9 C) 98 F (36.7 C) 98.2 F (36.8 C)  TempSrc: Oral Oral Oral Oral  SpO2: 100%  100% 97%  Weight:  58.2 kg    Height:        Exam:  alert, nad   no jvd  Chest cta bilat to bases  Cor reg no RG  Abd soft ntnd no ascites   Ext no LE edema   Alert, NF, ox3   R thigh TDC    OP HD: MWF [facility]   4h  400/800  59.5kg  2/2 bath  Hep 4000  R thigh TDC  Mircera 60 mcg q2wks - last 10/16/20 Calcitriol 1.61mcg PO qHD  Assessment/Plan: 1.  COVID+ - minimal symptoms. Plan per primary 2. Falls/weakness - likely due to #1 & poor PO intake. Xray shoulder, CT head/neg with no acute findings. OT/PT eval. Doing better 3.  ESRD -  On HD MWF.  Covid schedule OP will be TTS so we are now doing him TTS here. HD today.  4.  Hypertension/volume  - BP's good, no vol excess on exam, under 1kg 5.  Anemia of CKD - Hgb 11. ESA recently dosed.  6.  Secondary Hyperparathyroidism -  Ca in goal. Phos elevated. Not currently on binder, started Renvela 1 AC TID.  7.  Nutrition - Renal diet w/fluid restrictions.  8. CAD/ Hx complete heart block s/p pacemaker - continue home meds 9. DMT2 - diet controlled 10. Dispo - pending SNF placement  Rob Morghan Kester 10/26/2020, 12:55 PM   Recent Labs  Lab 10/23/20 0351 10/24/20 1303 10/25/20 0454 10/26/20 0354  K 3.7 4.3 3.9 3.9  BUN 28* 50* 33* 47*  CREATININE 7.25* 9.38* 7.03* 8.72*  CALCIUM 8.6* 8.3* 8.7* 8.9  PHOS 3.8 5.4* 4.8* 4.9*  HGB 11.2* 11.7*  --   --    Inpatient medications: . amiodarone  200 mg Oral Q M,W,F  . aspirin  81 mg Oral Daily  . calcitRIOL  1.25 mcg Oral Q T,Th,Sa-HD  . Chlorhexidine  Gluconate Cloth  6 each Topical Q0600  . heparin  5,000 Units Subcutaneous Q8H  . pantoprazole  20 mg Oral Daily  . sevelamer carbonate  800 mg Oral TID WC    loperamide

## 2020-10-27 LAB — GLUCOSE, CAPILLARY
Glucose-Capillary: 88 mg/dL (ref 70–99)
Glucose-Capillary: 161 mg/dL — ABNORMAL HIGH (ref 70–99)

## 2020-10-27 MED ORDER — HEPARIN SODIUM (PORCINE) 1000 UNIT/ML IJ SOLN
INTRAMUSCULAR | Status: AC
  Filled 2020-10-27: qty 8

## 2020-10-27 NOTE — Progress Notes (Signed)
Family Medicine Teaching Service Daily Progress Note Intern Pager: [phone]  Patient name: William [name] Medical record number: [id number] Date of birth: 05/13/1941 Age: 80 y.o. Gender: male  Primary Care Provider: Matilde Haymaker, MD Consultants: Nephro Code Status: FULL  Pt Overview and Major Events to Date:  10/22/20: admitted 10/24/20: medically stable for discharge  Assessment and Plan:  William [name] a 80 y.o.malewho presented with generalized weakness and multiple falls in the setting of COVID-19. 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  Patient is medically stable for discharge to SNF.  Generalized Weakness  Recent Falls Generalized weakness persists.  Physical therapy previously recommended SNF as patient has decreased endurance. Will get PT to re-evaluate.  -Continue PT/OT -Appreciate TOC assistance with SNF placement  COVID-19 Asymptomatic continues to be afebrile.  Stable on room air. Repeat test obtained 10/25/20 was positive. -s/p Remdesevir x3 days (1/12-1/14) -Contact/airborne precautions  ESRD on HD MWF  Anemia of CKD Last received dialysis on 1/13 (T/Th/S schedule due to COVID+ status).  Patient was scheduled to have dialysis yesterday however due to high patient census he was rescheduled for today. -Nephro following, appreciate recommendations -HD per nephro -Continue Renvela and Calcitriol per nephro -Monitor renal function panel  CAD  Hx of v-tach s/p pacemaker Chronic, stable. Pacemaker was interrogated and showed no events. -Continue home ASA and amiodarone  T2DM Last A1c 5.0 on 09/08/2020. Not on any home medications. CBG 88 this morning. -Daily CBG monitoring   FEN/GI: Renal diet 1200 mL fluid restriction PPx: Heparin   Status is: Inpatient Remains inpatient appropriate because:awaiting SNF placement  Dispo: The patient is from: Home              Anticipated d/c  is to: SNF              Patient currently is medically stable to d/c.    Subjective:  No significant overnight events.  Patient ate 90% of his breakfast, 100% of his lunch yesterday. RN reports minimal dinner intake.  Pt states he does not like the food. Food is better when he gets to choose his meals.   Objective: Temp:  [98 F (36.7 C)-98.3 F (36.8 C)] 98.3 F (36.8 C) (01/15 1711) Pulse Rate:  [68-79] 70 (01/15 1711) Resp:  [17-18] 18 (01/15 1711) BP: (141-159)/(76-80) 159/80 (01/15 1711) SpO2:  [97 %-100 %] 100 % (01/15 1711) Weight:  [58.6 kg] 58.6 kg (01/16 1829) Physical Exam:  GEN: pleasant elderly male, in no acute distress  CV: regular rate and rhythm RESP: no increased work of breathing, clear to ascultation bilaterally  ABD: Bowel sounds present. Soft, Nontender, Nondistended.  MSK: no LE edema, or calf tenderness  SKIN: warm, dry NEURO: grossly normal, moves all extremities appropriately PSYCH: Normal affect, appropriate speech and behavior    Laboratory: Recent Labs  Lab 10/22/20 0427 10/23/20 0351 10/24/20 1303  WBC 6.3 6.7 5.3  HGB 11.0* 11.2* 11.7*  HCT 32.5* 35.2* 35.7*  PLT 215 236 304   Recent Labs  Lab 10/21/20 1600 10/22/20 0537 10/24/20 1303 10/25/20 0454 10/26/20 0354  NA 129*   < > 135 135 134*  K 4.0   < > 4.3 3.9 3.9  CL 91*   < > 97* 98 97*  CO2 21*   < > 23 22 23   BUN 53*   < > 50* 33* 47*  CREATININE 11.28*   < > 9.38* 7.03* 8.72*  CALCIUM 8.6*   < >  8.3* 8.7* 8.9  PROT 6.5  --   --   --   --   BILITOT 0.4  --   --   --   --   ALKPHOS 57  --   --   --   --   ALT 20  --   --   --   --   AST 29  --   --   --   --   GLUCOSE 78   < > 89 109* 116*   < > = values in this interval not displayed.    Imaging/Diagnostic Tests: No results found.   Lyndee Hensen, DO 10/27/2020, 7:14 AM PGY-2, [place] Intern pager: [phone], text pages welcome

## 2020-10-27 NOTE — Progress Notes (Signed)
Pt does not c/o any pain. Pt's appetite and oral intake is fair. Pt had HD today without any event. Pt's needs are met and safety & isolation measures are maintained.

## 2020-10-27 NOTE — Progress Notes (Signed)
[facility] Progress Note  Subjective:  Patient not examined today directly given COVID-19 + status, utilizing data taken from chart +/- discussions w/ providers and staff.   BP's high normal  Vitals:   10/27/20 1122 10/27/20 1130 10/27/20 1200 10/27/20 1230  BP: (!) 175/85 (!) 159/82 (!) 155/106 (!) 153/83  Pulse:      Resp: 16 14 16 16   Temp:      TempSrc:      SpO2:      Weight:      Height:        Exam:   Patient not examined today directly given COVID-19 + status, utilizing data taken from chart +/- discussions w/ providers and staff.      OP HD: MWF [facility]   4h  400/800  59.5kg  2/2 bath  Hep 4000  R thigh TDC  Mircera 60 mcg q2wks - last 10/16/20 Calcitriol 1.64mcg PO qHD  Assessment/Plan: 1.  COVID+ - minimal symptoms. Plan per primary 2. Falls/weakness - likely due to #1 & poor PO intake. Xray shoulder, CT head/neg with no acute findings. OT/PT eval. Doing better 3.  ESRD -  On HD MWF.  Covid schedule OP will be TTS so we are now doing him TTS here. HD today , rollover from Sat.  4.  Hypertension/volume  - BP's good, no vol excess on exam, under 1-3 kg 5.  Anemia of CKD - Hgb 11. ESA recently dosed.  6.  Secondary Hyperparathyroidism -  Ca in goal. Phos elevated. Not currently on binder, started Renvela 1 AC TID.  7.  Nutrition - Renal diet w/fluid restrictions.  8. CAD/ Hx complete heart block s/p pacemaker - continue home meds 9. DMT2 - diet controlled 10. Dispo - pending SNF placement  Rob Leif Loflin 10/27/2020, 12:32 PM   Recent Labs  Lab 10/23/20 0351 10/24/20 1303 10/25/20 0454 10/26/20 0354  K 3.7 4.3 3.9 3.9  BUN 28* 50* 33* 47*  CREATININE 7.25* 9.38* 7.03* 8.72*  CALCIUM 8.6* 8.3* 8.7* 8.9  PHOS 3.8 5.4* 4.8* 4.9*  HGB 11.2* 11.7*  --   --    Inpatient medications: . heparin sodium (porcine)      . amiodarone  200 mg Oral Q M,W,F  . aspirin  81 mg Oral Daily  . calcitRIOL  1.25 mcg Oral Q T,Th,Sa-HD  . Chlorhexidine Gluconate  Cloth  6 each Topical Q0600  . heparin  5,000 Units Subcutaneous Q8H  . pantoprazole  20 mg Oral Daily  . sevelamer carbonate  800 mg Oral TID WC    loperamide

## 2020-10-28 LAB — GLUCOSE, CAPILLARY: Glucose-Capillary: 123 mg/dL — ABNORMAL HIGH (ref 70–99)

## 2020-10-28 MED ORDER — FAMOTIDINE 20 MG PO TABS
10.0000 mg | ORAL_TABLET | Freq: Every day | ORAL | Status: DC
  Filled 2020-10-28: qty 1

## 2020-10-28 MED ORDER — ATORVASTATIN CALCIUM 40 MG PO TABS
40.0000 mg | ORAL_TABLET | Freq: Every day | ORAL | Status: DC
  Administered 2020-10-28 – 2020-10-29 (×2): 40 mg via ORAL
  Filled 2020-10-28 (×2): qty 1

## 2020-10-28 NOTE — Progress Notes (Signed)
Renal Navigator notified patient's clinic/[facility] that he should be there for tx in isolation tomorrow evening after discharge. Inpt Nephrologist also updated. Navigator to continue to follow.  Alphonzo Cruise, [place] Renal Navigator [phone]

## 2020-10-28 NOTE — TOC Progression Note (Addendum)
Transition of Care [facility]) - Progression Note    Patient Details  Name: SHYLER WEMPE MRN: [id number] Date of Birth: 24-Sep-1941  Transition of [facility]) CM/SW [place], [place] Phone Number: 10/28/2020, 1:49 PM  Clinical Narrative:     CSW was notified by PT that they are recommending [place] as pt is walking around 400 feet. Pt called daughter [name] and notified her that pt is stable for d/c home. Benita expressed concern about how pt would be transported. CSW explained that pt could be transported by [facility]. Daughter explained that she had the key to the house and is unsure if she would be able to drive in [place] where pt lives due to icy roads. CSW will follow up with MD about d/c plan.   CSW called [place] with [facility] to confirm pt had [place] services. [place] stated she would would check and follow up with CSW.   1402: CSW confirmed with MD and Renal Navigator, plan for pt to d/c in AM in time for daughter to take pt to dialysis at 29pm. CSW notified daughter [name]. She requests [facility] transport in AM and stated she would arrange to have his home ready.   CSW confirmed with [place] at [facility] that pt is active. Orders for [facility] PT/OT/RN/Aide  Expected Discharge Plan: [place] Barriers to Discharge: Continued Medical Work up  Expected Discharge Plan and Services Expected Discharge Plan: [place] Choice: [place] arrangements for the past 2 months: Single Family Home                                       Social Determinants of Health (SDOH) Interventions    Readmission Risk Interventions No flowsheet data found.

## 2020-10-28 NOTE — Care Management Important Message (Signed)
Important Message  Patient Details  Name: William [name] MRN: [id number] Date of Birth: [birth year]   Medicare Important Message Given:  Yes - Important Message mailed due to current National Emergency  Verbal consent obtained due to current National Emergency  Relationship to patient: Self Contact Name: Candelario Steppe Call Date: 10/28/20  Time: 1408 Phone: [phone] Outcome: No Answer/Busy Important Message mailed to: Patient address on file   Delorse Lek 10/28/2020, 2:08 PM

## 2020-10-28 NOTE — Discharge Instructions (Signed)
Dear William [name],   Thank you for letting us participate in your care! In this section, you will find a brief summary of why you were admitted to the hospital, what happened during your admission, your diagnosis/diagnoses, and recommended follow up.   You were admitted because you were experiencing weakness and had a fall.    You were diagnosed with COVID.  You were treated with Remdesevir in addition to dialysis and your regular home medications  You were also seen by nephrology, our physical therapists and occupational therapists  Your strength improved and you were discharged from the hospital for meeting this goal.    POST-HOSPITAL & CARE INSTRUCTIONS 1. Please let PCP/Specialists know of any changes in medications that were made.  2. Please see medications section of this packet for any medication changes.   DOCTOR'S APPOINTMENTS & FOLLOW UP Future Appointments  Date Time Provider [place]  11/07/2020 10:45 AM Landis Martins, DPM [facility]  01/13/2021  9:30 AM [facility] DEVICE REMOTES [facility]  07/14/2021  9:30 AM [facility] DEVICE REMOTES [facility]  01/12/2022  9:30 AM [facility] DEVICE REMOTES [facility]  04/13/2022  9:30 AM [facility] DEVICE REMOTES [facility]  07/13/2022  9:30 AM [facility] DEVICE REMOTES [facility]    Thank you for choosing [facility]! Take care and be well!  [facility]  [place], [place] [address] [phone]

## 2020-10-28 NOTE — Progress Notes (Addendum)
Family Medicine Teaching Service Daily Progress Note Intern Pager: [phone]  Patient name: William [name] Medical record number: [id number] Date of birth: August 06, 1941 Age: 80 y.o. Gender: male  Primary Care Provider: Matilde Haymaker, MD Consultants: Nephro Code Status: FULL  Pt Overview and Major Events to Date:  10/22/20: admitted 10/24/20: medically stable for discharge to SNF  Assessment and Plan:  William [name] a 80 y.o.malewho presented with generalized weakness and multiple falls in the setting of COVID-19. 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   Generalized Weakness  Recent Falls Weakness seems to be improving, maybe even resolved entirely. Patient states he feels 100% better. He was able to walk >6 laps in his room yesterday. PT previously recommended SNF. -PT re-eval today, may be able to go home with home health -Appreciate TOC assistance with SNF placement vs. home health needs  COVID-19 Remains asymptomatic and stable on room air. -s/p Remdesevir x3 days (1/12-1/14) -Airborne/contact precautions  ESRD on HD MWF  Anemia of CKD Last received dialysis yesterday (1/16). He is on T/Th/S schedule due to COVID+ (was bumped from Sat due to census) -Nephro following, appreciate recommendations -Continue Renvela and Calcitriol per nephro -Monitor renal function panel  CAD  Hx of v-tach s/p pacemaker Chronic, stable. Pacemaker was interrogated this admission and showed no events.  -Continue home ASA and amiodarone -Atorvastatin '40mg'$  daily (previously on rosuvastatin at home, switched due to kidney function)  T2DM Last A1c 5.0 on 09/08/2020. Not on any home medications. PO intake has improved throughout hospitalization (last glucose 161 yesterday morning). -Daily CBG monitoring -Encourage PO intake  FEN/GI: Renal PPx: Heparin   Status is: Inpatient Remains inpatient appropriate  because:awaiting SNF   Dispo: The patient is from: Home              Anticipated d/c is to: SNF vs. home with home health              Anticipated d/c date is: 1 day              Patient currently is medically stable to d/c.    Subjective:  No acute events overnight. Patient feels well this morning and is in good spirits. States he feels 100% better. Ate 100% of his breakfast and is ambulating around his room without difficulty.  Objective: Temp:  [97.8 F (36.6 C)-98.7 F (37.1 C)] 98 F (36.7 C) (01/17 0503) Pulse Rate:  [71-95] 71 (01/17 0503) Resp:  [14-19] 17 (01/17 0503) BP: (113-180)/(76-106) 113/89 (01/17 0503) SpO2:  [98 %-100 %] 100 % (01/17 0503) Weight:  [58.6 kg] 58.6 kg (01/16 [id number]) Physical Exam: General: alert, well-appearing, NAD Cardiovascular: RRR, normal S1/S2 without m/r/g Respiratory: normal WOB on room air, lungs CTAB Abdomen: soft, nontender, nondistended Extremities: no peripheral edema Neuro: grossly intact, oriented x3, normal gait  Laboratory: Recent Labs  Lab 10/22/20 0427 10/23/20 0351 10/24/20 1303  WBC 6.3 6.7 5.3  HGB 11.0* 11.2* 11.7*  HCT 32.5* 35.2* 35.7*  PLT 215 236 304   Recent Labs  Lab 10/21/20 1600 10/22/20 0537 10/24/20 1303 10/25/20 0454 10/26/20 0354  NA 129*   < > 135 135 134*  K 4.0   < > 4.3 3.9 3.9  CL 91*   < > 97* 98 97*  CO2 21*   < > '23 22 23  '$ BUN 53*   < > 50* 33* 47*  CREATININE 11.28*   < > 9.38* 7.03* 8.72*  CALCIUM 8.6*   < > 8.3* 8.7* 8.9  PROT 6.5  --   --   --   --   BILITOT 0.4  --   --   --   --   ALKPHOS 57  --   --   --   --   ALT 20  --   --   --   --   AST 29  --   --   --   --   GLUCOSE 78   < > 89 109* 116*   < > = values in this interval not displayed.    Imaging/Diagnostic Tests: No new imaging/diagnostic tests   [name], MD 10/28/2020, 6:18 AM PGY-1, [place] Intern pager: [phone], text pages welcome

## 2020-10-28 NOTE — Hospital Course (Signed)
William [name] is a 80 y.o. male who presented with generalized weakness and multiple falls in the setting of COVID-19. PMH is significant for ESRD on HD MWF, CAD, HTN, DM, subdural hematoma, and nonsustained v-tach s/p pacemaker.  Generalized Weakness  Recent Falls Patient presented with generalized weakness and 2 falls at home. PT initially recommended SNF due to mobility limitations however patient demonstrated improvement throughout hospitalization. PT subsequently recommended home with home health. His weakness was ultimately thought to be related to his COVID.  COVID-19 Patient tested positive on 1/11. He remained asymptomatic throughout admission other than his initial weakness and some diarrhea. He was treated with Remdesevir x3 days. He did not require oxygen at any point.  ESRD on HD Nephro followed throughout his admission. Received dialysis on T/Th/S schedule due to COVID+ status.  CAD  Hx of v-tach s/p pacemaker Patient was started on Atorvastatin 40mg  daily (in place of Rosuvastatin due to renal excretion)

## 2020-10-28 NOTE — Progress Notes (Signed)
[facility] Progress Note  Subjective:  Doing well, no c/o's.   Vitals:   10/27/20 1549 10/27/20 1717 10/27/20 2024 10/28/20 0503  BP: (!) 169/96 114/80 119/89 113/89  Pulse: 79  95 71  Resp: 19  15 17   Temp: 98.1 F (36.7 C)  97.8 F (36.6 C) 98 F (36.7 C)  TempSrc: Axillary  Oral Oral  SpO2: 100%  100% 100%  Weight:      Height:        Exam: alert, nad  no jvd Chest cta bilat to bases Cor reg no RG Abd soft ntnd no ascites Ext no LE edema Alert, NF, ox3 R thigh TDC   OP HD: MWF [facility]   4h  400/800  59.5kg  2/2 bath  Hep 4000  R thigh TDC  Mircera 60 mcg q2wks - last 10/16/20 Calcitriol 1.25mcg PO qHD  Assessment/Plan: 1.  COVID+ - minimal symptoms. Plan per primary.  2. Falls/weakness - likely due to #1 & poor PO intake. Xray shoulder, CT head/neg with no acute findings. OT/PT eval. Doing better 3.  ESRD -  On HD MWF.  Covid schedule OP will be TTS so we are now doing him TTS here. Had HD yest off schedule. Next HD tomorrow.  4.  Hypertension/volume  - BP's good, no vol excess on exam, under 1 kg 5.  Anemia of CKD - Hgb 11. ESA recently dosed.  6.  Secondary Hyperparathyroidism -  Ca in goal. Phos elevated. Not currently on binder, started Renvela 1 AC TID.  7.  Nutrition - Renal diet w/fluid restrictions.  8. CAD/ Hx complete heart block s/p pacemaker - continue home meds 9. DMT2 - diet controlled 10. Dispo - pending SNF placement  Rob Tian Davison 10/28/2020, 9:20 AM   Recent Labs  Lab 10/23/20 0351 10/24/20 1303 10/25/20 0454 10/26/20 0354  K 3.7 4.3 3.9 3.9  BUN 28* 50* 33* 47*  CREATININE 7.25* 9.38* 7.03* 8.72*  CALCIUM 8.6* 8.3* 8.7* 8.9  PHOS 3.8 5.4* 4.8* 4.9*  HGB 11.2* 11.7*  --   --    Inpatient medications: . amiodarone  200 mg Oral Q M,W,F  . aspirin  81 mg Oral Daily  . calcitRIOL  1.25 mcg Oral Q T,Th,Sa-HD  . Chlorhexidine Gluconate Cloth  6 each Topical Q0600  . heparin  5,000 Units Subcutaneous Q8H  .  pantoprazole  20 mg Oral Daily  . sevelamer carbonate  800 mg Oral TID WC    loperamide

## 2020-10-28 NOTE — TOC Progression Note (Addendum)
Transition of Care [facility]) - Progression Note    Patient Details  Name: MYRTLE BARNHARD MRN: [id number] Date of Birth: 10/01/1941  Transition of Care [facility]) CM/SW [place], [place] Phone Number: 10/28/2020, 9:52 AM  Clinical Narrative:     [facility] messaged [facility] requesting possibility of pt getting on waitlist for when a covid+ and HD bed available. CSW is informed that [facility] is not accepting HD pt's "right now."  [facility] has no covid beds available either.    Expected Discharge Plan: [place] Barriers to Discharge: Continued Medical Work up  Expected Discharge Plan and Services Expected Discharge Plan: [place] Choice: [place] arrangements for the past 2 months: Single Family Home                                       Social Determinants of Health (SDOH) Interventions    Readmission Risk Interventions No flowsheet data found.

## 2020-10-28 NOTE — Progress Notes (Signed)
Physical Therapy Treatment Patient Details Name: William [name] MRN: [id number] DOB: 1941-08-30 Today's Date: 10/28/2020    History of Present Illness 80 y.o. 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    PT Comments    Pt making excellent progress towards his physical therapy goals, exhibiting improved activity tolerance and ambulation distance. Pt ambulating 10 laps with a walker without physical assist (a distance of ~400 feet total). Able to participate in standing exercises for BLE strengthening. Updated d/c plan in light of progress; pt agreeable.     Follow Up Recommendations  Home health PT     Equipment Recommendations  None recommended by PT    Recommendations for Other Services       Precautions / Restrictions Precautions Precautions: Fall Precaution Comments: R femoral permanent HD catheter Restrictions Weight Bearing Restrictions: No    Mobility  Bed Mobility Overal bed mobility: Independent                Transfers Overall transfer level: Independent Equipment used: None                Ambulation/Gait Ambulation/Gait assistance: Modified independent (Device/Increase time) Gait Distance (Feet): 400 Feet Assistive device: None;Rolling walker (2 wheeled) Gait Pattern/deviations: Step-through pattern;Decreased stride length Gait velocity: Decreased   General Gait Details: Cues for walker proximity, slow and steady pace. No evidence of gross instability   Stairs             Wheelchair Mobility    Modified Rankin (Stroke Patients Only)       Balance Overall balance assessment: Needs assistance Sitting-balance support: Feet supported Sitting balance-Leahy Scale: Good     Standing balance support: No upper extremity supported;During functional  activity Standing balance-Leahy Scale: Fair                              Cognition Arousal/Alertness: Awake/alert Behavior During Therapy: WFL for tasks assessed/performed Overall Cognitive Status: Within Functional Limits for tasks assessed                                        Exercises Other Exercises Other Exercises: Standing: bilateral hamstring curls x 10, hip abduction x 10, squats x 10, calf raises x 15    General Comments        Pertinent Vitals/Pain Pain Assessment: Faces Faces Pain Scale: No hurt    Home Living                      Prior Function            PT Goals (current goals can now be found in the care plan section) Acute Rehab PT Goals Patient Stated Goal: to go home; return to playing golf and tennis Potential to Achieve Goals: Good Progress towards PT goals: Progressing toward goals    Frequency    Min 3X/week      PT Plan Discharge plan needs to be updated    Co-evaluation              AM-PAC PT "6 Clicks" Mobility   Outcome Measure  Help needed turning from your back to your side while in a  flat bed without using bedrails?: None Help needed moving from lying on your back to sitting on the side of a flat bed without using bedrails?: None Help needed moving to and from a bed to a chair (including a wheelchair)?: None Help needed standing up from a chair using your arms (e.g., wheelchair or bedside chair)?: None Help needed to walk in hospital room?: None Help needed climbing 3-5 steps with a railing? : A Little 6 Click Score: 23    End of Session   Activity Tolerance: Patient tolerated treatment well Patient left: in bed;with call bell/phone within reach Nurse Communication: Mobility status PT Visit Diagnosis: Unsteadiness on feet (R26.81);Muscle weakness (generalized) (M62.81)     Time: 5498-2641 PT Time Calculation (min) (ACUTE ONLY): 35 min  Charges:  $Gait Training: 8-22  mins $Therapeutic Exercise: 8-22 mins                     Wyona Almas, PT, DPT Acute Rehabilitation Services Pager [phone] Office [phone]    Deno Etienne 10/28/2020, 12:51 PM

## 2020-10-29 LAB — GLUCOSE, CAPILLARY: Glucose-Capillary: 105 mg/dL — ABNORMAL HIGH (ref 70–99)

## 2020-10-29 MED ORDER — SEVELAMER CARBONATE 800 MG PO TABS: 800.0000 mg | ORAL_TABLET | Freq: Three times a day (TID) | ORAL | 0 refills | Status: DC

## 2020-10-29 MED ORDER — FAMOTIDINE 10 MG PO TABS: 10.0000 mg | ORAL_TABLET | Freq: Every day | ORAL | 2 refills | Status: AC

## 2020-10-29 MED ORDER — SEVELAMER CARBONATE 800 MG PO TABS: 800.0000 mg | ORAL_TABLET | Freq: Three times a day (TID) | ORAL | 2 refills | Status: AC

## 2020-10-29 MED ORDER — ATORVASTATIN CALCIUM 40 MG PO TABS: 40.0000 mg | ORAL_TABLET | Freq: Every day | ORAL | 0 refills | Status: DC

## 2020-10-29 MED ORDER — FAMOTIDINE 10 MG PO TABS: 10.0000 mg | ORAL_TABLET | Freq: Every day | ORAL | 0 refills | Status: DC

## 2020-10-29 MED ORDER — ATORVASTATIN CALCIUM 40 MG PO TABS: 40.0000 mg | ORAL_TABLET | Freq: Every day | ORAL | 2 refills | Status: DC

## 2020-10-29 NOTE — Progress Notes (Signed)
OT Cancellation Note  Patient Details Name: William [name] MRN: [id number] DOB: Jan 08, 1941   Cancelled Treatment:    Reason Eval/Treat Not Completed: Other (comment) Pt requesting to finish breakfast prior to OT session, will check back as time allows.   Harley Alto., COTA/L Acute Rehabilitation Services [phone]   [name] 10/29/2020, 9:43 AM

## 2020-10-29 NOTE — Progress Notes (Signed)
Paitient provided with discharge education and instructions. Verbalized understanding of all information provided. IV removed, no issues noted. Discharged with all belongings via transport services. Daughter, [name], Also notified at this time of patient discharge to home and states she will meet transport at the home of patient.

## 2020-10-29 NOTE — Progress Notes (Signed)
Remote pacemaker transmission.   

## 2020-10-29 NOTE — TOC Transition Note (Signed)
Transition of Care [facility]) - CM/SW Discharge Note   Patient Details  Name: TYDEN HILLE MRN: [id number] Date of Birth: May 12, 1941  Transition of Care [facility]) CM/SW Contact:  Bethann Berks[place], [place] Phone Number: 10/29/2020, 9:54 AM   Clinical Narrative:     Patient will DC to: Home [address]  [place] [address] Anticipated DC date: 10/29/20 Family notified: Daughter Leisure centre manager by: Corey Harold   Per MD patient ready for DC to Home. RN, patient, patient's family notified of DC. DC packet on chart. Ambulance transport requested for patient.   CSW will sign off for now as social work intervention is no longer needed. Please consult [place] again if new needs arise.     Barriers to Discharge: Continued Medical Work up   Patient Goals and CMS Choice Patient states their goals for this hospitalization and ongoing recovery are:: To return home   Choice offered to / list presented to : Patient  Discharge Placement                       Discharge Plan and Services     Post Acute Care Choice: Home Health                               Social Determinants of Health (SDOH) Interventions     Readmission Risk Interventions No flowsheet data found.

## 2020-10-29 NOTE — Discharge Summary (Addendum)
[facility] Discharge Summary  Patient name: William [name] Medical record number: [id number] Date of birth: [birth year] Age: 80 y.o. Gender: male Date of Admission: 10/21/2020  Date of Discharge: 10/29/2020 Admitting Physician: Zenia Resides, MD  Primary Care Provider: Matilde Haymaker, MD Consultants: Nephrology, PT/OT  Indication for Hospitalization: Weakness/Falls, COVID-19  Discharge Diagnoses/Problem List:  Generalized Weakness COVID-19 ESRD on HD Anemia of CKD CAD HTN Hx of v-tach s/p pacemaker T2DM Hx of subdural hematoma  Disposition: Home with Home Health  Discharge Condition: Stable, Improved  Discharge Exam:  Gen: alert, well-appearing, NAD CV: RRR, normal S1/S2 Resp: normal WOB on room air, lungs CTAB Abd: soft, nontender, nondistended Ext: no peripheral edema Neuro: alert, oriented x3, 5/5 strength in all four extremities, normal speech, normal gait  Brief Hospital Course:   William [name] is a 80 y.o. male who presented with generalized weakness and multiple falls in the setting of COVID-19. PMH is significant for ESRD on HD MWF, CAD, HTN, DM, subdural hematoma, and nonsustained v-tach s/p pacemaker.  Generalized Weakness  Recent Falls Patient presented with generalized weakness and 2 falls at home. His workup was unremarkable other than being positive for COVID-19. Imaging including CT head, x-ray of lumbar spine and CXR were all normal. The patient was seen by PT and OT, who initially recommended SNF due to fatigue and generalized weakness resulting in mobility limitations. However patient demonstrated improvement throughout hospitalization and PT subsequently recommended home with home health. He was able to walk >6 laps around his room without difficulty on day of discharge. His weakness was ultimately thought to be related to his COVID.  COVID-19 Patient tested positive on admission (1/11). He remained asymptomatic other  than some initial weakness and diarrhea. Patient was treated with Remdesevir x3 days. He did not require oxygen at any point.  ESRD on HD Nephro followed William [name] throughout his admission. He received dialysis on T/Th/S schedule due to COVID+ status.   CAD  Hx of v-tach s/p pacemaker Patient was started on Atorvastatin '40mg'$  daily (in place of his Rosuvastatin due to renal excretion). His pacemaker was interrogated during admission and did not show any events.   GERD Patient's PPI (Protonix '20mg'$  daily) was discontinued due to potential adverse effects of long term PPI use, particularly in elderly patients. Instead, he was switched to Pepcid '10mg'$  daily for management of his GERD.    Issues for Follow Up:  See above for medication changes (Atorvastatin, Pepcid) Otherwise no issues for follow-up  Significant Procedures: None  Significant Labs and Imaging:  Recent Labs  Lab 10/23/20 0351 10/24/20 1303  WBC 6.7 5.3  HGB 11.2* 11.7*  HCT 35.2* 35.7*  PLT 236 304   Recent Labs  Lab 10/23/20 0351 10/24/20 1303 10/25/20 0454 10/26/20 0354  NA 136 135 135 134*  K 3.7 4.3 3.9 3.9  CL 99 97* 98 97*  CO2 '22 23 22 23  '$ GLUCOSE 79 89 109* 116*  BUN 28* 50* 33* 47*  CREATININE 7.25* 9.38* 7.03* 8.72*  CALCIUM 8.6* 8.3* 8.7* 8.9  PHOS 3.8 5.4* 4.8* 4.9*  ALBUMIN 2.8* 2.7* 2.5* 2.5*    Results/Tests Pending at Time of Discharge: None  Discharge Medications:  Allergies as of 10/29/2020       Reactions   Carvedilol Other (See Comments)   Makes him feel like his pelvis is "grinding" (??) Patient doesn't recall this, however        Medication List  STOP taking these medications    pantoprazole 20 MG tablet Commonly known as: PROTONIX   rosuvastatin 10 MG tablet Commonly known as: CRESTOR       TAKE these medications    amiodarone 200 MG tablet Commonly known as: PACERONE TAKE 1 TABLET BY MOUTH ON MONDAY, WEDNESDAY AND FRIDAY (3 TIMES A WEEK) What changed: See  the new instructions.   aspirin 81 MG chewable tablet Chew 1 tablet (81 mg total) by mouth daily.   atorvastatin 40 MG tablet Commonly known as: LIPITOR Take 1 tablet (40 mg total) by mouth daily.   calcitRIOL 0.25 MCG capsule Commonly known as: ROCALTROL Take 5 capsules (1.25 mcg total) by mouth every Monday, Wednesday, and Friday with hemodialysis.   famotidine 10 MG tablet Commonly known as: PEPCID Take 1 tablet (10 mg total) by mouth daily.   sevelamer carbonate 800 MG tablet Commonly known as: RENVELA Take 1 tablet (800 mg total) by mouth 3 (three) times daily with meals.   vitamin C 1000 MG tablet Take 1 tablet by mouth daily.               Discharge Care Instructions  (From admission, onward)           Start     Ordered   10/29/20 0000  Discharge wound care:       Comments: Per home health RN   10/29/20 [id number]            Discharge Instructions: Please refer to Patient Instructions section of EMR for full details.  Patient was counseled important signs and symptoms that should prompt return to medical care, changes in medications, dietary instructions, activity restrictions, and follow up appointments.   Follow-Up Appointments:  Follow-up Information     [facility] Follow up.   Specialty: Home Health Services Why: The home health agency will contact you for the first home visit. Contact information: [address] [place] [address] [phone]                 [name], MD 10/29/2020, 9:20 AM PGY-1, [facility]  Upper Level Addendum:  I have seen and evaluated this patient along with Dr. Rock Nephew and reviewed the above note, making necessary revisions as needed.  Lurline Del, DO 10/29/2020, 12:56 PM PGY-2, [place]

## 2020-10-30 ENCOUNTER — Telehealth: Payer: Self-pay | Admitting: Nephrology

## 2020-10-30 NOTE — Telephone Encounter (Signed)
Transition of care contact from inpatient facility  Date of discharge: 10/29/20 Date of contact: 10/30/20 Method: Phone Spoke to: Patient  Patient contacted to discuss transition of care from recent inpatient hospitalization. Patient was admitted to [facility] from  1/10-1/18/22 with discharge diagnosis of generalized weakness/COVID-19 infection.   Medication changes were reviewed.  Patient will follow up with his/her outpatient HD unit on: Tomorrow on COVID isolation shift.

## 2020-11-07 ENCOUNTER — Ambulatory Visit: Payer: Medicare HMO | Admitting: Sports Medicine

## 2020-12-08 ENCOUNTER — Telehealth: Payer: Self-pay | Admitting: Physician Assistant

## 2020-12-08 NOTE — Telephone Encounter (Signed)
Paged by answering service. Patient reported bilateral ankle swelling for past week or so. No chest pain, shortness of breath, orthopnea, PND, melena or blood in his stool or urine. Patient reports compliance with dialysis on Monday Wednesday Friday. He did mention during dialysis to on-call doctor but no instruction were given.  Denies excess salt intake but does eats bacon. Advised to limit salt, leg elevation and compression stocking. If no improvement he will reach out to PCP or our office for further evaluation.  He does make urine.

## 2021-01-09 ENCOUNTER — Encounter: Payer: Self-pay | Admitting: Sports Medicine

## 2021-01-09 ENCOUNTER — Other Ambulatory Visit: Payer: Self-pay

## 2021-01-09 ENCOUNTER — Ambulatory Visit (INDEPENDENT_AMBULATORY_CARE_PROVIDER_SITE_OTHER): Payer: Medicare PPO | Admitting: Sports Medicine

## 2021-01-09 DIAGNOSIS — B351 Tinea unguium: Secondary | ICD-10-CM | POA: Diagnosis not present

## 2021-01-09 DIAGNOSIS — L84 Corns and callosities: Secondary | ICD-10-CM

## 2021-01-09 DIAGNOSIS — M79675 Pain in left toe(s): Secondary | ICD-10-CM

## 2021-01-09 DIAGNOSIS — N186 End stage renal disease: Secondary | ICD-10-CM

## 2021-01-09 DIAGNOSIS — M79674 Pain in right toe(s): Secondary | ICD-10-CM

## 2021-01-09 DIAGNOSIS — M79672 Pain in left foot: Secondary | ICD-10-CM

## 2021-01-09 DIAGNOSIS — E119 Type 2 diabetes mellitus without complications: Secondary | ICD-10-CM

## 2021-01-09 NOTE — Progress Notes (Signed)
Subjective: William [name] is a 80 y.o. male patient with history of diabetes who presents to office today complaining of callus on left foot and long painful nails; unable to trim. Patient states that the glucose reading this morning was not recorded; reports that they keep track at dialysis M/W/F like previous.  Reports that he had COVID last year. Has PT coming to home. No other issues noted.   Patient Active Problem List   Diagnosis Date Noted  . General weakness 10/23/2020  . Pressure injury of skin 10/22/2020  . Weakness 10/22/2020  . Fall   . COVID-19 virus infection   . Transient loss of consciousness 09/08/2020  . Hypovitaminosis D 02/06/2020  . PAD (peripheral artery disease) ([place]) 02/06/2020  . Complication of vascular dialysis catheter 11/28/2019  . Allergy, unspecified, initial encounter 07/31/2019  . 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  . Hospital discharge follow-up 04/11/2019  . 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 Encounter for removal of sutures 12/13/2018  . Iron deficiency anemia, unspecified 11/30/2018  . Uremia syndrome   . ESRD (end stage renal disease) on dialysis ([place]) 11/19/2018  . Malnutrition of moderate degree 11/19/2018  . 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 Chronic systolic (congestive) heart failure ([place]) 11/18/2018  . Fever, unspecified 11/18/2018  . Other specified coagulation defects ([place]) 11/18/2018  . Pain, unspecified 11/18/2018  . Sarcoidosis, unspecified 11/18/2018  . Secondary hyperparathyroidism of renal origin ([place]) 11/18/2018  . Shortness of breath 11/18/2018  . Type 2 diabetes mellitus with diabetic peripheral angiopathy without gangrene ([place]) 11/18/2018  . Acute on chronic combined systolic and diastolic CHF (congestive heart failure) ([place])   . Abdominal pain   . Renal cyst 10/17/2018  . Ventricular tachycardia, non-sustained ([place])   . Decreased activities of daily living (ADL)   . Palliative care by specialist   . 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 Dehydration   . Gait instability 01/06/2018  . Type 2 diabetes mellitus without complication, without long-term current use of insulin ([place]) 10/30/2017  . Benign essential HTN 10/30/2017  . Hyperlipidemia 10/30/2017  . Ethmoid sinusitis 07/11/2015  . Subdural hematoma ([place]) 07/08/2015  . CAD (coronary artery disease) 10/26/2014  . 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   Current Outpatient Medications on File Prior to Visit  Medication Sig Dispense Refill  . amiodarone (PACERONE) 200 MG tablet TAKE 1 TABLET BY MOUTH ON MONDAY, WEDNESDAY AND FRIDAY (3 TIMES A WEEK) 36 tablet 3  . Ascorbic Acid (VITAMIN C) 1000 MG tablet Take 1 tablet by mouth daily.    [name] aspirin 81 MG chewable tablet Chew 1 tablet (81 mg total) by mouth daily. 30 tablet 1  . atorvastatin (LIPITOR) 40 MG tablet Take 1 tablet (40 mg total) by mouth daily. 30 tablet 2  . calcitRIOL (ROCALTROL) 0.25 MCG capsule Take 5 capsules (1.25 mcg total) by mouth every Monday, Wednesday, and Friday with hemodialysis. 60 capsule 0  . famotidine (PEPCID) 10 MG tablet Take 1 tablet (10 mg total) by mouth daily. 30 tablet 2   No current facility-administered medications on file prior to visit.   Allergies  Allergen Reactions  . Carvedilol Other (See Comments)    Makes him feel like his pelvis is "grinding" (??) Patient doesn't recall this, however    Recent Results (from the past 2160 hour(s))  CUP PACEART REMOTE DEVICE CHECK     Status: None   Collection Time: 10/14/20  5:00 AM  Result  Value Ref Range   Date Time Interrogation Session [id number]    Pulse Generator Manufacturer MERM    Pulse Gen Model [id number] Adapta    Pulse Gen Serial Number [id number]    Clinic Name [facility]    Implantable Pulse Generator Type Implantable Pulse Generator    Implantable Pulse Generator Implant Date [id number]    Implantable Lead Manufacturer [facility]     Implantable Lead Model 5076 CapSureFix Novus    Implantable Lead Serial Number [id number]    Implantable Lead Implant Date [id number]    Implantable Lead Location Detail 1 UNKNOWN    Implantable Lead Location [id number]    Implantable Lead Manufacturer [facility]    Implantable Lead Model 5076 CapSureFix Novus    Implantable Lead Serial Number [id number]    Implantable Lead Implant Date [id number]    Implantable Lead Location Detail 1 UNKNOWN    Implantable Lead Location [phone]    Lead Channel Setting Sensing Sensitivity 4.00 mV   Lead Channel Setting Pacing Amplitude 1.500 V   Lead Channel Setting Pacing Pulse Width 0.40 ms   Lead Channel Setting Pacing Amplitude 2.000 V   Lead Channel Impedance Value 419 ohm   Lead Channel Pacing Threshold Amplitude 0.500 V   Lead Channel Pacing Threshold Pulse Width 0.40 ms   Lead Channel Impedance Value 526 ohm   Lead Channel Pacing Threshold Amplitude 0.500 V   Lead Channel Pacing Threshold Pulse Width 0.40 ms   Battery Status OK    Battery Remaining Longevity 55 mo   Battery Voltage 2.77 V   Battery Impedance 1,114 ohm   Brady Statistic AP VP Percent 11 %   Brady Statistic AS VP Percent 89 %   Brady Statistic AP VS Percent 0 %   Brady Statistic AS VS Percent 0 %  CBC WITH DIFFERENTIAL     Status: Abnormal   Collection Time: 10/21/20  4:00 PM  Result Value Ref Range   WBC 6.2 4.0 - 10.5 K/uL   RBC 3.25 (L) 4.22 - 5.81 MIL/uL   Hemoglobin 10.4 (L) 13.0 - 17.0 g/dL   HCT 32.2 (L) 39.0 - 52.0 %   MCV 99.1 80.0 - 100.0 fL   MCH 32.0 26.0 - 34.0 pg   MCHC 32.3 30.0 - 36.0 g/dL   RDW 15.7 (H) 11.5 - 15.5 %   Platelets 228 150 - 400 K/uL   nRBC 0.0 0.0 - 0.2 %   Neutrophils Relative % 62 %   Neutro Abs 3.9 1.7 - 7.7 K/uL   Lymphocytes Relative 21 %   Lymphs Abs 1.3 0.7 - 4.0 K/uL   Monocytes Relative 17 %   Monocytes Absolute 1.1 (H) 0.1 - 1.0 K/uL   Eosinophils Relative 0 %   Eosinophils Absolute 0.0 0.0 - 0.5 K/uL   Basophils Relative 0 %    Basophils Absolute 0.0 0.0 - 0.1 K/uL   Immature Granulocytes 0 %   Abs Immature Granulocytes 0.01 0.00 - 0.07 K/uL    Comment: Performed at [facility], [address] [place], [place] [address]  CK     Status: None   Collection Time: 10/21/20  4:00 PM  Result Value Ref Range   Total CK 263 49 - 397 U/L    Comment: Performed at [facility], [place] [address], [place], [place] [address]  Comprehensive metabolic panel     Status: Abnormal   Collection Time: 10/21/20  4:00 PM  Result Value Ref Range   Sodium  129 (L) 135 - 145 mmol/L   Potassium 4.0 3.5 - 5.1 mmol/L   Chloride 91 (L) 98 - 111 mmol/L   CO2 21 (L) 22 - 32 mmol/L   Glucose, Bld 78 70 - 99 mg/dL    Comment: Glucose reference range applies only to samples taken after fasting for at least 8 hours.   BUN 53 (H) 8 - 23 mg/dL   Creatinine, Ser 11.28 (H) 0.61 - 1.24 mg/dL   Calcium 8.6 (L) 8.9 - 10.3 mg/dL   Total Protein 6.5 6.5 - 8.1 g/dL   Albumin 2.8 (L) 3.5 - 5.0 g/dL   AST 29 15 - 41 U/L   ALT 20 0 - 44 U/L   Alkaline Phosphatase 57 38 - 126 U/L   Total Bilirubin 0.4 0.3 - 1.2 mg/dL   GFR, Estimated 4 (L) >60 mL/min    Comment: (NOTE) Calculated using the CKD-EPI Creatinine Equation (2021)    Anion gap 17 (H) 5 - 15    Comment: Performed at [facility], [place] [address] [place], [place] [address]  Troponin I (High Sensitivity)     Status: Abnormal   Collection Time: 10/21/20  4:00 PM  Result Value Ref Range   Troponin I (High Sensitivity) 34 (H) <18 ng/L    Comment: (NOTE) Elevated high sensitivity troponin I (hsTnI) values and significant  changes across serial measurements may suggest ACS but many other  chronic and acute conditions are known to elevate hsTnI results.  Refer to the "Links" section for chest pain algorithms and additional  guidance. Performed at [facility], [place] [address], [place], [place] [address]   SARS CORONAVIRUS 2 (TAT 6-24 HRS) Nasopharyngeal Nasopharyngeal  Swab     Status: Abnormal   Collection Time: 10/21/20 10:22 PM   Specimen: Nasopharyngeal Swab  Result Value Ref Range   SARS Coronavirus 2 POSITIVE (A) NEGATIVE    Comment: (NOTE) SARS-CoV-2 target nucleic acids are DETECTED.  The SARS-CoV-2 RNA is generally detectable in upper and lower respiratory specimens during the acute phase of infection. Positive results are indicative of the presence of SARS-CoV-2 RNA. Clinical correlation with patient history and other diagnostic information is  necessary to determine patient infection status. Positive results do not rule out bacterial infection or co-infection with other viruses.  The expected result is Negative.  Fact Sheet for Patients: SugarRoll.be  Fact Sheet for Healthcare Providers: https://www.woods-mathews.com/  This test is not yet approved or cleared by the [place] FDA and  has been authorized for detection and/or diagnosis of SARS-CoV-2 by FDA under an Emergency Use Authorization (EUA). This EUA will remain  in effect (meaning this test can be used) for the duration of the COVID-19 declaration under Section 564(b)(1) of the Act, 21 U. S.C. section 360bbb-3(b)(1), unless the authorization is terminated or revoked sooner.   Performed at [facility], [place] [address] [place], [place] [address]   Troponin I (High Sensitivity)     Status: Abnormal   Collection Time: 10/21/20 10:46 PM  Result Value Ref Range   Troponin I (High Sensitivity) 31 (H) <18 ng/L    Comment: (NOTE) Elevated high sensitivity troponin I (hsTnI) values and significant  changes across serial measurements may suggest ACS but many other  chronic and acute conditions are known to elevate hsTnI results.  Refer to the "Links" section for chest pain algorithms and additional  guidance. Performed at [facility], [place] [address] [place], [place] [address]  CBC     Status: Abnormal   Collection  Time: 10/22/20  4:27 AM  Result Value Ref Range   WBC 6.3 4.0 - 10.5 K/uL   RBC 3.31 (L) 4.22 - 5.81 MIL/uL   Hemoglobin 11.0 (L) 13.0 - 17.0 g/dL   HCT 32.5 (L) 39.0 - 52.0 %   MCV 98.2 80.0 - 100.0 fL   MCH 33.2 26.0 - 34.0 pg   MCHC 33.8 30.0 - 36.0 g/dL   RDW 16.2 (H) 11.5 - 15.5 %   Platelets 215 150 - 400 K/uL   nRBC 0.0 0.0 - 0.2 %    Comment: Performed at [facility] [address], [place], [place] [address]  Renal function panel     Status: Abnormal   Collection Time: 10/22/20  5:37 AM  Result Value Ref Range   Sodium 132 (L) 135 - 145 mmol/L   Potassium 4.0 3.5 - 5.1 mmol/L   Chloride 97 (L) 98 - 111 mmol/L   CO2 19 (L) 22 - 32 mmol/L   Glucose, Bld 71 70 - 99 mg/dL    Comment: Glucose reference range applies only to samples taken after fasting for at least 8 hours.   BUN 58 (H) 8 - 23 mg/dL   Creatinine, Ser 11.21 (H) 0.61 - 1.24 mg/dL   Calcium 8.2 (L) 8.9 - 10.3 mg/dL   Phosphorus 6.4 (H) 2.5 - 4.6 mg/dL   Albumin 2.6 (L) 3.5 - 5.0 g/dL   GFR, Estimated 4 (L) >60 mL/min    Comment: (NOTE) Calculated using the CKD-EPI Creatinine Equation (2021)    Anion gap 16 (H) 5 - 15    Comment: Performed at [facility] [address], [place], [place] [address]  CBG monitoring, ED     Status: Abnormal   Collection Time: 10/22/20 12:42 PM  Result Value Ref Range   Glucose-Capillary 100 (H) 70 - 99 mg/dL    Comment: Glucose reference range applies only to samples taken after fasting for at least 8 hours.   Comment 1 Notify RN    Comment 2 Document in Chart   CBG monitoring, ED     Status: None   Collection Time: 10/22/20  7:16 PM  Result Value Ref Range   Glucose-Capillary 84 70 - 99 mg/dL    Comment: Glucose reference range applies only to samples taken after fasting for at least 8 hours.  Glucose, capillary     Status: None   Collection Time: 10/22/20 11:47 PM  Result Value Ref Range   Glucose-Capillary 98 70 - 99 mg/dL    Comment: Glucose reference  range applies only to samples taken after fasting for at least 8 hours.  CBC     Status: Abnormal   Collection Time: 10/23/20  3:51 AM  Result Value Ref Range   WBC 6.7 4.0 - 10.5 K/uL   RBC 3.51 (L) 4.22 - 5.81 MIL/uL   Hemoglobin 11.2 (L) 13.0 - 17.0 g/dL   HCT 35.2 (L) 39.0 - 52.0 %   MCV 100.3 (H) 80.0 - 100.0 fL   MCH 31.9 26.0 - 34.0 pg   MCHC 31.8 30.0 - 36.0 g/dL   RDW 15.8 (H) 11.5 - 15.5 %   Platelets 236 150 - 400 K/uL   nRBC 0.0 0.0 - 0.2 %    Comment: Performed at [facility] [address], [place], [place] [address]  Renal function panel     Status: Abnormal  Collection Time: 10/23/20  3:51 AM  Result Value Ref Range   Sodium 136 135 - 145 mmol/L   Potassium 3.7 3.5 - 5.1 mmol/L   Chloride 99 98 - 111 mmol/L   CO2 22 22 - 32 mmol/L   Glucose, Bld 79 70 - 99 mg/dL    Comment: Glucose reference range applies only to samples taken after fasting for at least 8 hours.   BUN 28 (H) 8 - 23 mg/dL   Creatinine, Ser 7.25 (H) 0.61 - 1.24 mg/dL    Comment: DELTA CHECK NOTED   Calcium 8.6 (L) 8.9 - 10.3 mg/dL   Phosphorus 3.8 2.5 - 4.6 mg/dL   Albumin 2.8 (L) 3.5 - 5.0 g/dL   GFR, Estimated 7 (L) >60 mL/min    Comment: (NOTE) Calculated using the CKD-EPI Creatinine Equation (2021)    Anion gap 15 5 - 15    Comment: Performed at [facility] [address], [place], [place] [address]  Glucose, capillary     Status: Abnormal   Collection Time: 10/23/20  5:21 AM  Result Value Ref Range   Glucose-Capillary 64 (L) 70 - 99 mg/dL    Comment: Glucose reference range applies only to samples taken after fasting for at least 8 hours.  Glucose, capillary     Status: Abnormal   Collection Time: 10/23/20  6:02 AM  Result Value Ref Range   Glucose-Capillary 64 (L) 70 - 99 mg/dL    Comment: Glucose reference range applies only to samples taken after fasting for at least 8 hours.  Glucose, capillary     Status: Abnormal   Collection Time: 10/23/20 11:42 AM  Result  Value Ref Range   Glucose-Capillary 64 (L) 70 - 99 mg/dL    Comment: Glucose reference range applies only to samples taken after fasting for at least 8 hours.  Glucose, capillary     Status: None   Collection Time: 10/23/20  4:51 PM  Result Value Ref Range   Glucose-Capillary 73 70 - 99 mg/dL    Comment: Glucose reference range applies only to samples taken after fasting for at least 8 hours.  Glucose, capillary     Status: Abnormal   Collection Time: 10/23/20 11:18 PM  Result Value Ref Range   Glucose-Capillary 120 (H) 70 - 99 mg/dL    Comment: Glucose reference range applies only to samples taken after fasting for at least 8 hours.  Glucose, capillary     Status: None   Collection Time: 10/24/20  5:26 AM  Result Value Ref Range   Glucose-Capillary 80 70 - 99 mg/dL    Comment: Glucose reference range applies only to samples taken after fasting for at least 8 hours.  Glucose, capillary     Status: None   Collection Time: 10/24/20 11:24 AM  Result Value Ref Range   Glucose-Capillary 84 70 - 99 mg/dL    Comment: Glucose reference range applies only to samples taken after fasting for at least 8 hours.  Renal function panel     Status: Abnormal   Collection Time: 10/24/20  1:03 PM  Result Value Ref Range   Sodium 135 135 - 145 mmol/L   Potassium 4.3 3.5 - 5.1 mmol/L   Chloride 97 (L) 98 - 111 mmol/L   CO2 23 22 - 32 mmol/L   Glucose, Bld 89 70 - 99 mg/dL    Comment: Glucose reference range applies only to samples taken after fasting for at least 8 hours.  BUN 50 (H) 8 - 23 mg/dL   Creatinine, Ser 9.38 (H) 0.61 - 1.24 mg/dL   Calcium 8.3 (L) 8.9 - 10.3 mg/dL   Phosphorus 5.4 (H) 2.5 - 4.6 mg/dL   Albumin 2.7 (L) 3.5 - 5.0 g/dL   GFR, Estimated 5 (L) >60 mL/min    Comment: (NOTE) Calculated using the CKD-EPI Creatinine Equation (2021)    Anion gap 15 5 - 15    Comment: Performed at [facility] [address] [place], [place] [address]  CBC     Status: Abnormal    Collection Time: 10/24/20  1:03 PM  Result Value Ref Range   WBC 5.3 4.0 - 10.5 K/uL   RBC 3.67 (L) 4.22 - 5.81 MIL/uL   Hemoglobin 11.7 (L) 13.0 - 17.0 g/dL   HCT 35.7 (L) 39.0 - 52.0 %   MCV 97.3 80.0 - 100.0 fL   MCH 31.9 26.0 - 34.0 pg   MCHC 32.8 30.0 - 36.0 g/dL   RDW 15.4 11.5 - 15.5 %   Platelets 304 150 - 400 K/uL   nRBC 0.0 0.0 - 0.2 %    Comment: Performed at [facility], [place] [address], [place], [place] [address]  Hepatitis B surface antigen     Status: None   Collection Time: 10/24/20  1:03 PM  Result Value Ref Range   Hepatitis B Surface Ag NON REACTIVE NON REACTIVE    Comment: Performed at [facility] [address], [place], [place] [address]  Glucose, capillary     Status: Abnormal   Collection Time: 10/24/20  6:27 PM  Result Value Ref Range   Glucose-Capillary 62 (L) 70 - 99 mg/dL    Comment: Glucose reference range applies only to samples taken after fasting for at least 8 hours.  Glucose, capillary     Status: Abnormal   Collection Time: 10/24/20  8:44 PM  Result Value Ref Range   Glucose-Capillary 147 (H) 70 - 99 mg/dL    Comment: Glucose reference range applies only to samples taken after fasting for at least 8 hours.  Renal function panel     Status: Abnormal   Collection Time: 10/25/20  4:54 AM  Result Value Ref Range   Sodium 135 135 - 145 mmol/L   Potassium 3.9 3.5 - 5.1 mmol/L   Chloride 98 98 - 111 mmol/L   CO2 22 22 - 32 mmol/L   Glucose, Bld 109 (H) 70 - 99 mg/dL    Comment: Glucose reference range applies only to samples taken after fasting for at least 8 hours.   BUN 33 (H) 8 - 23 mg/dL   Creatinine, Ser 7.03 (H) 0.61 - 1.24 mg/dL   Calcium 8.7 (L) 8.9 - 10.3 mg/dL   Phosphorus 4.8 (H) 2.5 - 4.6 mg/dL   Albumin 2.5 (L) 3.5 - 5.0 g/dL   GFR, Estimated 7 (L) >60 mL/min    Comment: (NOTE) Calculated using the CKD-EPI Creatinine Equation (2021)    Anion gap 15 5 - 15    Comment: Performed at [facility] [address] [place], [place] [address]  Glucose, capillary     Status: None   Collection Time: 10/25/20  6:31 AM  Result Value Ref Range   Glucose-Capillary 97 70 - 99 mg/dL    Comment: Glucose reference range applies only to samples taken after fasting for at least 8 hours.  SARS CORONAVIRUS 2 (TAT 6-24 HRS) Nasopharyngeal Nasopharyngeal Swab  Status: Abnormal   Collection Time: 10/25/20 10:39 AM   Specimen: Nasopharyngeal Swab  Result Value Ref Range   SARS Coronavirus 2 POSITIVE (A) NEGATIVE    Comment: (NOTE) SARS-CoV-2 target nucleic acids are DETECTED.  The SARS-CoV-2 RNA is generally detectable in upper and lower respiratory specimens during the acute phase of infection. Positive results are indicative of the presence of SARS-CoV-2 RNA. Clinical correlation with patient history and other diagnostic information is  necessary to determine patient infection status. Positive results do not rule out bacterial infection or co-infection with other viruses.  The expected result is Negative.  Fact Sheet for Patients: SugarRoll.be  Fact Sheet for Healthcare Providers: https://www.woods-mathews.com/  This test is not yet approved or cleared by the [place] FDA and  has been authorized for detection and/or diagnosis of SARS-CoV-2 by FDA under an Emergency Use Authorization (EUA). This EUA will remain  in effect (meaning this test can be used) for the duration of the COVID-19 declaration under Section 564(b)(1) of the Act, 21 U. S.C. section 360bbb-3(b)(1), unless the authorization is terminated or revoked sooner.   Performed at [facility], [place] [address] [place], [place] [address]   Glucose, capillary     Status: Abnormal   Collection Time: 10/25/20 11:57 AM  Result Value Ref Range   Glucose-Capillary 121 (H) 70 - 99 mg/dL    Comment: Glucose reference range applies only to samples taken after fasting for at least 8 hours.  Glucose,  capillary     Status: Abnormal   Collection Time: 10/25/20  4:50 PM  Result Value Ref Range   Glucose-Capillary 163 (H) 70 - 99 mg/dL    Comment: Glucose reference range applies only to samples taken after fasting for at least 8 hours.  Glucose, capillary     Status: None   Collection Time: 10/25/20  9:23 PM  Result Value Ref Range   Glucose-Capillary 77 70 - 99 mg/dL    Comment: Glucose reference range applies only to samples taken after fasting for at least 8 hours.  Glucose, capillary     Status: Abnormal   Collection Time: 10/26/20 12:13 AM  Result Value Ref Range   Glucose-Capillary 160 (H) 70 - 99 mg/dL    Comment: Glucose reference range applies only to samples taken after fasting for at least 8 hours.   Comment 1 Notify RN   Renal function panel     Status: Abnormal   Collection Time: 10/26/20  3:54 AM  Result Value Ref Range   Sodium 134 (L) 135 - 145 mmol/L   Potassium 3.9 3.5 - 5.1 mmol/L   Chloride 97 (L) 98 - 111 mmol/L   CO2 23 22 - 32 mmol/L   Glucose, Bld 116 (H) 70 - 99 mg/dL    Comment: Glucose reference range applies only to samples taken after fasting for at least 8 hours.   BUN 47 (H) 8 - 23 mg/dL   Creatinine, Ser 8.72 (H) 0.61 - 1.24 mg/dL   Calcium 8.9 8.9 - 10.3 mg/dL   Phosphorus 4.9 (H) 2.5 - 4.6 mg/dL   Albumin 2.5 (L) 3.5 - 5.0 g/dL   GFR, Estimated 6 (L) >60 mL/min    Comment: (NOTE) Calculated using the CKD-EPI Creatinine Equation (2021)    Anion gap 14 5 - 15    Comment: Performed at [facility] [address] [place], [place] [address]  Glucose, capillary     Status: None   Collection Time: 10/26/20  5:29 AM  Result Value Ref Range   Glucose-Capillary 71 70 - 99 mg/dL    Comment: Glucose reference range applies only to samples taken after fasting for at least 8 hours.  Glucose, capillary     Status: Abnormal   Collection Time: 10/26/20  9:47 AM  Result Value Ref Range   Glucose-Capillary 201 (H) 70 - 99 mg/dL    Comment: Glucose  reference range applies only to samples taken after fasting for at least 8 hours.  Glucose, capillary     Status: Abnormal   Collection Time: 10/26/20 11:21 AM  Result Value Ref Range   Glucose-Capillary 135 (H) 70 - 99 mg/dL    Comment: Glucose reference range applies only to samples taken after fasting for at least 8 hours.  Glucose, capillary     Status: Abnormal   Collection Time: 10/26/20  5:29 PM  Result Value Ref Range   Glucose-Capillary 68 (L) 70 - 99 mg/dL    Comment: Glucose reference range applies only to samples taken after fasting for at least 8 hours.   Comment 1 Notify RN    Comment 2 Document in Chart   Glucose, capillary     Status: Abnormal   Collection Time: 10/26/20  6:14 PM  Result Value Ref Range   Glucose-Capillary 112 (H) 70 - 99 mg/dL    Comment: Glucose reference range applies only to samples taken after fasting for at least 8 hours.  Glucose, capillary     Status: Abnormal   Collection Time: 10/26/20  9:04 PM  Result Value Ref Range   Glucose-Capillary 136 (H) 70 - 99 mg/dL    Comment: Glucose reference range applies only to samples taken after fasting for at least 8 hours.  Glucose, capillary     Status: None   Collection Time: 10/27/20  6:34 AM  Result Value Ref Range   Glucose-Capillary 88 70 - 99 mg/dL    Comment: Glucose reference range applies only to samples taken after fasting for at least 8 hours.   Comment 1 Notify RN    Comment 2 Document in Chart   Glucose, capillary     Status: Abnormal   Collection Time: 10/27/20  9:18 AM  Result Value Ref Range   Glucose-Capillary 161 (H) 70 - 99 mg/dL    Comment: Glucose reference range applies only to samples taken after fasting for at least 8 hours.  Glucose, capillary     Status: Abnormal   Collection Time: 10/28/20  9:26 AM  Result Value Ref Range   Glucose-Capillary 123 (H) 70 - 99 mg/dL    Comment: Glucose reference range applies only to samples taken after fasting for at least 8 hours.   Glucose, capillary     Status: Abnormal   Collection Time: 10/29/20  8:10 AM  Result Value Ref Range   Glucose-Capillary 105 (H) 70 - 99 mg/dL    Comment: Glucose reference range applies only to samples taken after fasting for at least 8 hours.    Objective: General: Patient is awake, alert, and oriented x 3 and in no acute distress.  Integument: Skin is warm, dry and supple bilateral. Nails are tender, long, thickened and dystrophic with subungual debris, consistent with onychomycosis, 1-5 bilateral. + callus sub met 5 on left. No signs of infection. Remaining integument unremarkable.  Neurovascular status: unchanged from prior.   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 Muscular strength 5/5 in all lower extremity muscular groups bilateral without pain on range of motion .  No tenderness with calf compression bilateral.  Assessment and Plan: Problem List Items Addressed This Visit   None   Visit Diagnoses    Pain due to onychomycosis of toenails of both feet    -  Primary   Callus of foot       Left foot pain       ESRD (end stage renal disease) ([place])       Diabetes mellitus without complication ([place])          -Examined patient. -Re-Discussed and educated patient on diabetic foot care, especially with regards to the vascular, neurological and musculoskeletal systems.  -Mechanically debrided all nails 1-5 bilateral using sterile nail nipper and filed with dremel without incident  -At no additional charge mechanically debrided callus using a sterile chisel blade plantar left foot submet 5 without incident -Answered all patient questions -Patient to return  in 3 months for at risk foot care -Patient advised to call the office if any problems or questions arise in the meantime.  Landis Martins, DPM

## 2021-01-15 ENCOUNTER — Telehealth: Payer: Self-pay | Admitting: Family Medicine

## 2021-01-15 NOTE — Telephone Encounter (Signed)
Pt's daughter, Culley [place] request doctor to give her a call. Ph # [phone]

## 2021-01-15 NOTE — Telephone Encounter (Signed)
Patients daughter calls nurse line wanting to speak with PCP prior to apt tomorrow. William [name] is out of town for work and unfortunately can not come with him tomorrow. William [name] states she would like to touch base with PCP about somethings going on with her dad. Her sister may bring him, but that is still unclear. If you can call her prior to his apt tomorrow she would really appreciate it.   William [name] [phone]

## 2021-01-15 NOTE — Telephone Encounter (Signed)
Called Benita and addressed concerns.  Matilde Haymaker, MD

## 2021-01-16 ENCOUNTER — Ambulatory Visit (INDEPENDENT_AMBULATORY_CARE_PROVIDER_SITE_OTHER): Payer: Medicare PPO | Admitting: Family Medicine

## 2021-01-16 ENCOUNTER — Encounter: Payer: Self-pay | Admitting: Family Medicine

## 2021-01-16 ENCOUNTER — Other Ambulatory Visit: Payer: Self-pay

## 2021-01-16 VITALS — BP 109/66 | HR 71 | Ht 66.0 in | Wt 137.8 lb

## 2021-01-16 DIAGNOSIS — Z992 Dependence on renal dialysis: Secondary | ICD-10-CM

## 2021-01-16 DIAGNOSIS — N186 End stage renal disease: Secondary | ICD-10-CM

## 2021-01-16 MED ORDER — HYDROCORTISONE 1 % EX OINT: 1.0000 "application " | TOPICAL_OINTMENT | Freq: Two times a day (BID) | CUTANEOUS | 0 refills | Status: DC | PRN

## 2021-01-16 NOTE — Patient Instructions (Signed)
It was great to see you today.  Here is a quick review of the things we talked about:  Peritoneal dialysis: I am sorry that I could not provide more information about peritoneal dialysis.  I recommend having further discussions with either the vascular surgeon or the nephrologist about some of the details of peritoneal dialysis.  Overall, I do think this could be helpful change for William [name].  Frequent falls: I am happy to continue providing physical therapy at home as long as the physical therapist thinks it would provide some benefit.  Palliative care: I have placed a referral to palliative care so that you can continue to have some family discussions about appropriate goals of care and potentially get access to additional resources.  You should get a call to schedule an appointment with palliative care in the next 1-2 weeks.  Itching: I have prescribed some topical hydrocortisone to help with your itching.  Please come back to clinic in the next 1-2 months to address some of your chronic medical needs.

## 2021-01-16 NOTE — Progress Notes (Addendum)
SUBJECTIVE:   CHIEF COMPLAINT / HPI:   Mr. Kuyper was hospitalized this past year for Covid infection in addition to frequent falls.  He and his daughters wanted to spend some time speaking with the primary care physician about some of his accumulating health issues.  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  This is been an issue in the past due to side effects from hemodialysis which include passing out during dialysis and having low blood pressures which may be contributing to his falls.  He was recently seen by vascular surgeon who recommended he think about peritoneal dialysis.  Mr. Folino feels that this is an appropriate change because it could be helpful for some of the blood pressure issues he has been experiencing with dialysis.  His daughters remain concerned that this may require a lot of work will be difficult to provide for him at home.  Additionally, his daughters are concerned he may not have adequate help needed at home right now to help him get peritoneal dialysis.  Following his recent hospitalization, he was discharged with home health physical therapy, home health nurse and CNA.  Frequent falls In the past year, he has had multiple falls which are usually related to presyncopal or syncopal episodes.  He did have a work-up performed during his last hospitalization which was ultimately unrevealing.  It is likely that his falls are related to his end-stage renal disease on low blood pressures.  He reports he is currently receiving physical therapy at home which she finds helpful and wants to know if there is anything more that he should be doing to help prevent falls at this time.  Goals of care Mr. Bruck has been working closely with his daughters, [name] and Rise Paganini regarding his healthcare decisions.  He is aware that his medical problems are accumulating and there may come a time when they wish to change  directions with regard to the goals of his medical care.  Itching scar Mr. 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  He notices this most at night would like to know if there is anything he can be given for it.  Fatigue with meals Mr. Kinslow reports that he finds himself significantly fatigued while eating and immediately following meals.  He sometimes finds that he has fallen asleep at the dinner table.  This is never resulted in a choking episode.  He would like to know if there is any obvious cause for this sleepiness or fatigue related to meals.  He does not associate this sleepiness with abdominal pain, nausea, vomiting.  PERTINENT  PMH / PSH: ESRD, CKD, PAD, diabetes  OBJECTIVE:   BP 109/66   Pulse 71   Ht '5\' 6"'$  (1.676 m)   Wt 137 lb 12.8 oz (62.5 kg)   SpO2 99%   BMI 22.24 kg/m    General: Alert and cooperative and appears to be in no acute distress.  Mr. Gyure was seated comfortably in his exam chair for the majority of the visit.  His 2 daughters, Rise Paganini and [name] were on speaker phone for the visit.  He did not have any apparent memory difficulty or speech issues during our visit.  He was able to stand up and walk out of the office comfortably following our conversation. Cardio: Normal S1 and S2, no S3 or S4. Rhythm is  regular. No murmurs or rubs.   Pulm: Clear to auscultation bilaterally, no crackles, wheezing, or diminished breath sounds. Normal respiratory effort Abdomen: Bowel sounds normal. Abdomen soft and non-tender.  Extremities: Strong right radial pulse.  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  No obvious excoriations. Neuro: Cranial nerves grossly intact  ASSESSMENT/PLAN:   Fall No new falls.  No new work-up needed at this time. -Continue physical therapy at home as long as it is beneficial   Peritoneal dialysis I informed Mr. Mendiola and his  daughters of some of the basic principles of peritoneal dialysis and how it can be helpful in some situations because it has fewer side effects related to volume shifts.  Mr. Linkenhoker may experience fewer symptoms related to low blood pressure as result of peritoneal dialysis.  For the details and daily requirements of peritoneal dialysis, I have asked him to speak with the nephrologist or the vascular surgeon who would be more familiar with these details.  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  I wonder if the demands of digestion Deibert enough blood flow that it causes significant fatigue and weariness.  It may be helpful to consider midodrine to increase his blood pressure.  We discussed starting this medication today and decided not to.  This may be an appropriate decision for the near future.  Goals of care At present, Mr. Benenhaley is interested in a full scope of care.  We discussed that his medical issues are not building and there may be a point in the future where we change directions regarding goals of care.  I encouraged him and his daughters to consider palliative care discussions regarding goals of care so that the whole family would be on the same page regarding Mr. Pabian would like for his help.  Both Mr. Mackin and his daughters felt this was a good idea. -Placed referral to palliative care for goals of care discussion  Matilde Haymaker, MD [place]

## 2021-01-16 NOTE — Assessment & Plan Note (Addendum)
No new falls.  No new work-up needed at this time. -Continue physical therapy at home as long as it is beneficial

## 2021-01-21 ENCOUNTER — Telehealth: Payer: Self-pay | Admitting: Nurse Practitioner

## 2021-01-21 NOTE — Telephone Encounter (Signed)
Spoke with patient's daughter, William [name], regarding the Palliative referral/services and all questions were answered and she was in agreement with scheduling visit.  I have scheduled an In-home Consult for 02/11/21 @  12:30 PM

## 2021-02-11 ENCOUNTER — Other Ambulatory Visit: Payer: Self-pay

## 2021-02-11 ENCOUNTER — Telehealth: Payer: Self-pay | Admitting: Nurse Practitioner

## 2021-02-11 ENCOUNTER — Other Ambulatory Visit: Payer: Medicare PPO | Admitting: Nurse Practitioner

## 2021-02-11 NOTE — Telephone Encounter (Signed)
I called William [name], William [name] daughter to confirm initial PC visit and covid screening. William [name] and I talked about the appointment she was not aware of the time. We talked about either continuing with the appointment or rescheduling. William [name] endorses she would call her siblings and see if okay to proceed with current time. I returned William [name] call to verify. William [name] endorses to continue with current appointment. 15 minutes later, William [name] called back requesting to reschedule as her siblings were not able to be present. Rescheduled per request.

## 2021-02-17 ENCOUNTER — Other Ambulatory Visit: Payer: Self-pay | Admitting: Family Medicine

## 2021-02-24 ENCOUNTER — Ambulatory Visit (INDEPENDENT_AMBULATORY_CARE_PROVIDER_SITE_OTHER): Payer: Medicare PPO

## 2021-02-24 DIAGNOSIS — I441 Atrioventricular block, second degree: Secondary | ICD-10-CM

## 2021-02-25 ENCOUNTER — Other Ambulatory Visit: Payer: Self-pay

## 2021-02-25 ENCOUNTER — Encounter: Payer: Self-pay | Admitting: Nurse Practitioner

## 2021-02-25 ENCOUNTER — Other Ambulatory Visit: Payer: Medicare PPO | Admitting: Nurse Practitioner

## 2021-02-25 DIAGNOSIS — R531 Weakness: Secondary | ICD-10-CM

## 2021-02-25 DIAGNOSIS — Z515 Encounter for palliative care: Secondary | ICD-10-CM

## 2021-02-25 DIAGNOSIS — R5381 Other malaise: Secondary | ICD-10-CM
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
## 2021-02-26 LAB — CUP PACEART REMOTE DEVICE CHECK
Battery Impedance: 1279 Ohm
Battery Remaining Longevity: 49 mo
Battery Voltage: 2.76 V
Brady Statistic AP VP Percent: 10 %
Brady Statistic AP VS Percent: 0 %
Brady Statistic AS VP Percent: 90 %
Brady Statistic AS VS Percent: 0 %
Date Time Interrogation Session: 20220514172749
Implantable Lead Implant Date: 20060418
Implantable Lead Implant Date: 20060418
Implantable Lead Location: 753859
Implantable Lead Location: 753860
Implantable Lead Model: 5076
Implantable Lead Model: 5076
Implantable Pulse Generator Implant Date: 20060418
Lead Channel Impedance Value: 438 Ohm
Lead Channel Impedance Value: 495 Ohm
Lead Channel Pacing Threshold Amplitude: 0.625 V
Lead Channel Pacing Threshold Amplitude: 0.625 V
Lead Channel Pacing Threshold Pulse Width: 0.4 ms
Lead Channel Pacing Threshold Pulse Width: 0.4 ms
Lead Channel Setting Pacing Amplitude: 1.5 V
Lead Channel Setting Pacing Amplitude: 2 V
Lead Channel Setting Pacing Pulse Width: 0.4 ms
Lead Channel Setting Sensing Sensitivity: 4 mV

## 2021-02-27 ENCOUNTER — Telehealth: Payer: Self-pay

## 2021-02-27 NOTE — Telephone Encounter (Signed)
02/27/21 '@1150AM'$ : Palliative care SW outreached patients daughter, [name], per [facility] NP -C. Gusler, to assess for needs and support.   Call unsuccessful. SW left HIPPA compliant VM with contact information. Awaiting return call.

## 2021-03-10 ENCOUNTER — Emergency Department (HOSPITAL_COMMUNITY): Payer: Medicare PPO

## 2021-03-10 ENCOUNTER — Encounter (HOSPITAL_COMMUNITY): Payer: Self-pay | Admitting: Student in an Organized Health Care Education/Training Program

## 2021-03-10 ENCOUNTER — Observation Stay (HOSPITAL_COMMUNITY): Payer: Medicare PPO

## 2021-03-10 ENCOUNTER — Other Ambulatory Visit: Payer: Self-pay

## 2021-03-10 ENCOUNTER — Observation Stay (HOSPITAL_COMMUNITY)
Admission: EM | Admit: 2021-03-10 | Discharge: 2021-03-13 | Disposition: A | Discharge status: Home / Self Care | Payer: Medicare PPO | Attending: Family Medicine | Admitting: Family Medicine

## 2021-03-10 DIAGNOSIS — I700 Atherosclerosis of aorta: Secondary | ICD-10-CM | POA: Insufficient documentation

## 2021-03-10 DIAGNOSIS — R7989 Other specified abnormal findings of blood chemistry: Secondary | ICD-10-CM

## 2021-03-10 DIAGNOSIS — Z950 Presence of cardiac pacemaker: Secondary | ICD-10-CM | POA: Diagnosis not present

## 2021-03-10 DIAGNOSIS — R531 Weakness: Principal | ICD-10-CM | POA: Insufficient documentation

## 2021-03-10 DIAGNOSIS — Z7982 Long term (current) use of aspirin: Secondary | ICD-10-CM | POA: Diagnosis not present

## 2021-03-10 DIAGNOSIS — Z20822 Contact with and (suspected) exposure to covid-19: Secondary | ICD-10-CM | POA: Insufficient documentation

## 2021-03-10 DIAGNOSIS — E119 Type 2 diabetes mellitus without complications: Secondary | ICD-10-CM

## 2021-03-10 DIAGNOSIS — R7401 Elevation of levels of liver transaminase levels: Secondary | ICD-10-CM | POA: Diagnosis not present

## 2021-03-10 DIAGNOSIS — Z79899 Other long term (current) drug therapy: Secondary | ICD-10-CM | POA: Insufficient documentation

## 2021-03-10 DIAGNOSIS — N186 End stage renal disease: Secondary | ICD-10-CM

## 2021-03-10 DIAGNOSIS — R188 Other ascites: Secondary | ICD-10-CM | POA: Insufficient documentation

## 2021-03-10 DIAGNOSIS — R197 Diarrhea, unspecified: Secondary | ICD-10-CM | POA: Diagnosis not present

## 2021-03-10 DIAGNOSIS — I5043 Acute on chronic combined systolic (congestive) and diastolic (congestive) heart failure: Secondary | ICD-10-CM | POA: Insufficient documentation

## 2021-03-10 DIAGNOSIS — K810 Acute cholecystitis: Secondary | ICD-10-CM

## 2021-03-10 DIAGNOSIS — Z992 Dependence on renal dialysis: Secondary | ICD-10-CM | POA: Diagnosis not present

## 2021-03-10 DIAGNOSIS — I2510 Atherosclerotic heart disease of native coronary artery without angina pectoris: Secondary | ICD-10-CM | POA: Diagnosis not present

## 2021-03-10 DIAGNOSIS — R2681 Unsteadiness on feet: Secondary | ICD-10-CM | POA: Insufficient documentation

## 2021-03-10 DIAGNOSIS — R1031 Right lower quadrant pain: Secondary | ICD-10-CM | POA: Diagnosis not present

## 2021-03-10 DIAGNOSIS — I739 Peripheral vascular disease, unspecified: Secondary | ICD-10-CM

## 2021-03-10 DIAGNOSIS — R1011 Right upper quadrant pain: Secondary | ICD-10-CM

## 2021-03-10 DIAGNOSIS — E1122 Type 2 diabetes mellitus with diabetic chronic kidney disease: Secondary | ICD-10-CM | POA: Insufficient documentation

## 2021-03-10 DIAGNOSIS — I132 Hypertensive heart and chronic kidney disease with heart failure and with stage 5 chronic kidney disease, or end stage renal disease: Secondary | ICD-10-CM | POA: Insufficient documentation

## 2021-03-10 DIAGNOSIS — Z8616 Personal history of COVID-19: Secondary | ICD-10-CM | POA: Insufficient documentation

## 2021-03-10 DIAGNOSIS — I249 Acute ischemic heart disease, unspecified: Secondary | ICD-10-CM

## 2021-03-10 DIAGNOSIS — E875 Hyperkalemia: Secondary | ICD-10-CM | POA: Diagnosis not present

## 2021-03-10 DIAGNOSIS — M6281 Muscle weakness (generalized): Secondary | ICD-10-CM | POA: Insufficient documentation

## 2021-03-10 LAB — COMPREHENSIVE METABOLIC PANEL
ALT: 101 U/L — ABNORMAL HIGH (ref 0–44)
AST: 108 U/L — ABNORMAL HIGH (ref 15–41)
Albumin: 3.4 g/dL — ABNORMAL LOW (ref 3.5–5.0)
Alkaline Phosphatase: 152 U/L — ABNORMAL HIGH (ref 38–126)
Anion gap: 17 — ABNORMAL HIGH (ref 5–15)
BUN: 61 mg/dL — ABNORMAL HIGH (ref 8–23)
CO2: 23 mmol/L (ref 22–32)
Calcium: 9.1 mg/dL (ref 8.9–10.3)
Chloride: 93 mmol/L — ABNORMAL LOW (ref 98–111)
Creatinine, Ser: 12.81 mg/dL — ABNORMAL HIGH (ref 0.61–1.24)
GFR, Estimated: 4 mL/min — ABNORMAL LOW (ref >60)
Glucose, Bld: 160 mg/dL — ABNORMAL HIGH (ref 70–99)
Potassium: 5.8 mmol/L — ABNORMAL HIGH (ref 3.5–5.1)
Sodium: 133 mmol/L — ABNORMAL LOW (ref 135–145)
Total Bilirubin: 1.3 mg/dL — ABNORMAL HIGH (ref 0.3–1.2)
Total Protein: 7.5 g/dL (ref 6.5–8.1)

## 2021-03-10 LAB — CBC WITH DIFFERENTIAL/PLATELET
Abs Immature Granulocytes: 0.05 10*3/uL (ref 0.00–0.07)
Basophils Absolute: 0 10*3/uL (ref 0.0–0.1)
Basophils Relative: 0 %
Eosinophils Absolute: 0 10*3/uL (ref 0.0–0.5)
Eosinophils Relative: 0 %
HCT: 36.2 % — ABNORMAL LOW (ref 39.0–52.0)
Hemoglobin: 11.6 g/dL — ABNORMAL LOW (ref 13.0–17.0)
Immature Granulocytes: 1 %
Lymphocytes Relative: 6 %
Lymphs Abs: 0.6 10*3/uL — ABNORMAL LOW (ref 0.7–4.0)
MCH: 32 pg (ref 26.0–34.0)
MCHC: 32 g/dL (ref 30.0–36.0)
MCV: 100 fL (ref 80.0–100.0)
Monocytes Absolute: 1.2 10*3/uL — ABNORMAL HIGH (ref 0.1–1.0)
Monocytes Relative: 11 %
Neutro Abs: 9.1 10*3/uL — ABNORMAL HIGH (ref 1.7–7.7)
Neutrophils Relative %: 82 %
Platelets: 228 10*3/uL (ref 150–400)
RBC: 3.62 MIL/uL — ABNORMAL LOW (ref 4.22–5.81)
RDW: 15.9 % — ABNORMAL HIGH (ref 11.5–15.5)
WBC: 11.1 10*3/uL — ABNORMAL HIGH (ref 4.0–10.5)
nRBC: 0 % (ref 0.0–0.2)

## 2021-03-10 LAB — RESP PANEL BY RT-PCR (FLU A&B, COVID) ARPGX2
Influenza A by PCR: NEGATIVE
Influenza B by PCR: NEGATIVE
SARS Coronavirus 2 by RT PCR: NEGATIVE

## 2021-03-10 LAB — LIPASE, BLOOD: Lipase: 42 U/L (ref 11–51)

## 2021-03-10 LAB — MAGNESIUM: Magnesium: 2.3 mg/dL (ref 1.7–2.4)

## 2021-03-10 LAB — GLUCOSE, CAPILLARY
Glucose-Capillary: 89 mg/dL (ref 70–99)
Glucose-Capillary: 119 mg/dL — ABNORMAL HIGH (ref 70–99)

## 2021-03-10 LAB — LIPID PANEL
Cholesterol: 141 mg/dL (ref 0–200)
HDL: 63 mg/dL (ref >40)
LDL Cholesterol: 66 mg/dL (ref 0–99)
Total CHOL/HDL Ratio: 2.2 RATIO
Triglycerides: 58 mg/dL (ref <150)
VLDL: 12 mg/dL (ref 0–40)

## 2021-03-10 LAB — TSH: TSH: 1.038 u[IU]/mL (ref 0.350–4.500)

## 2021-03-10 MED ORDER — ACETAMINOPHEN 325 MG PO TABS: 650.0000 mg | ORAL_TABLET | Freq: Four times a day (QID) | ORAL | Status: DC | PRN

## 2021-03-10 MED ORDER — METRONIDAZOLE 500 MG/100ML IV SOLN
500.0000 mg | Freq: Three times a day (TID) | INTRAVENOUS | Status: DC
  Administered 2021-03-10 – 2021-03-11 (×2): 500 mg via INTRAVENOUS
  Filled 2021-03-10 (×2): qty 100

## 2021-03-10 MED ORDER — SODIUM CHLORIDE 0.9 % IV SOLN
1.0000 g | INTRAVENOUS | Status: DC
  Filled 2021-03-10: qty 1

## 2021-03-10 MED ORDER — CHLORHEXIDINE GLUCONATE CLOTH 2 % EX PADS
6.0000 | MEDICATED_PAD | Freq: Every day | CUTANEOUS | Status: DC
  Administered 2021-03-11: 6 via TOPICAL

## 2021-03-10 MED ORDER — LANTHANUM CARBONATE 500 MG PO CHEW
500.0000 mg | CHEWABLE_TABLET | Freq: Three times a day (TID) | ORAL | Status: DC
  Administered 2021-03-10 – 2021-03-13 (×7): 500 mg via ORAL
  Filled 2021-03-10 (×6): qty 1

## 2021-03-10 MED ORDER — SODIUM CHLORIDE 0.9 % IV SOLN
1.0000 g | INTRAVENOUS | Status: DC
  Administered 2021-03-10: 1 g via INTRAVENOUS
  Filled 2021-03-10 (×2): qty 1

## 2021-03-10 MED ORDER — CALCITRIOL 0.5 MCG PO CAPS: 1.2500 ug | ORAL_CAPSULE | ORAL | Status: DC

## 2021-03-10 MED ORDER — ACETAMINOPHEN 650 MG RE SUPP: 650.0000 mg | Freq: Four times a day (QID) | RECTAL | Status: DC | PRN

## 2021-03-10 MED ORDER — HEPARIN SODIUM (PORCINE) 5000 UNIT/ML IJ SOLN
5000.0000 [IU] | Freq: Three times a day (TID) | INTRAMUSCULAR | Status: DC
  Administered 2021-03-10 – 2021-03-13 (×8): 5000 [IU] via SUBCUTANEOUS
  Filled 2021-03-10 (×8): qty 1

## 2021-03-10 MED ORDER — ASPIRIN 81 MG PO CHEW
81.0000 mg | CHEWABLE_TABLET | Freq: Every day | ORAL | Status: DC
  Administered 2021-03-10 – 2021-03-13 (×3): 81 mg via ORAL
  Filled 2021-03-10 (×3): qty 1

## 2021-03-10 MED ORDER — SODIUM ZIRCONIUM CYCLOSILICATE 5 G PO PACK
5.0000 g | PACK | Freq: Once | ORAL | Status: AC
  Administered 2021-03-10: 5 g via ORAL
  Filled 2021-03-10 (×2): qty 1

## 2021-03-10 NOTE — ED Notes (Signed)
Attempted report to inpatient floor x 2

## 2021-03-10 NOTE — ED Notes (Signed)
Patient transported to CT 

## 2021-03-10 NOTE — Plan of Care (Signed)
  Problem: Health Behavior/Discharge Planning: Goal: Ability to manage health-related needs will improve Outcome: Progressing   
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
## 2021-03-10 NOTE — ED Notes (Signed)
Patient returned from CT

## 2021-03-10 NOTE — ED Triage Notes (Signed)
Pt arrives via EMS from home with c/o N/V/D began this morning. HD M/W/F. Due today but has been feeling week since last fridays tx. Did not eat or drink yesterday. Restricted right thigh.  20g LFA 250NS given  BP 101/62 HR: 77- pace maker 100% Ra CBG 147
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
## 2021-03-10 NOTE — ED Notes (Signed)
Patient transported to X-ray 

## 2021-03-10 NOTE — ED Notes (Signed)
Pt back in room from CT 
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
## 2021-03-10 NOTE — Progress Notes (Addendum)
FPTS Interim Progress Note  S:patient resting comfortably in bed. He denies any current complaints. His RUQ has mild discomfort but is not causing him any distress and is distracted by watching TV. He does not want any pain medications including topicals as he doesn't want any more treatments to cloud the results of what's already being done. He denies pain in his catheter site. Discussed his CT abdomen findings with him and that there is still an open differential for gallbladder dz vs fluid overload, therefore, the antibiotics and HD in the am.   O: BP 131/76 (BP Location: Left Arm)   Pulse 65   Temp 98.1 F (36.7 C) (Oral)   Resp 17   Ht '5\' 7"'$  (1.702 m)   Wt 63.9 kg   SpO2 100%   BMI 22.06 kg/m   Physical Exam Constitutional:      General: He is not in acute distress.    Appearance: He is not ill-appearing or toxic-appearing.  Cardiovascular:     Rate and Rhythm: Normal rate.  Pulmonary:     Effort: Pulmonary effort is normal.  Abdominal:     General: Abdomen is flat.     Palpations: Abdomen is soft.     Tenderness: There is no abdominal tenderness.  Skin:    General: Skin is warm and dry.     Findings: No lesion or rash.     Comments: Neg tenderness to palpation near HD catheter. Did not appreciate swelling or redness.   Neurological:     Mental Status: He is alert and oriented to person, place, and time.  Psychiatric:        Mood and Affect: Mood normal.    A/P: 80yo male with PMH significant for ESRD admitted for generalized weakness and sequelae of such including electrolyte and fluid abnormalities. RUQ [place] significant for gallbladder pathology favored to be related to systemic pathology and radiologist recommended HIDA scan for follow up. Patient is comfortable tonight and does not have current complaints. Described plan to patient to continue antibiotics tonight as well as HD in the am and consider surgery consult in am. Patient is not favorable to a procedure.  -  ordered blood cultures - RFP and hepatic function panel am - orthostatics ordered - consider HIDA scan  Richarda Osmond, DO 03/10/2021, 9:39 PM PGY-3, [place] [facility] Service pager [phone]

## 2021-03-11 ENCOUNTER — Observation Stay (HOSPITAL_COMMUNITY): Payer: Medicare PPO

## 2021-03-11 DIAGNOSIS — N186 End stage renal disease: Secondary | ICD-10-CM | POA: Diagnosis not present

## 2021-03-11 DIAGNOSIS — R197 Diarrhea, unspecified: Secondary | ICD-10-CM | POA: Diagnosis not present

## 2021-03-11 DIAGNOSIS — R531 Weakness: Secondary | ICD-10-CM

## 2021-03-11 DIAGNOSIS — R1011 Right upper quadrant pain: Secondary | ICD-10-CM

## 2021-03-11 DIAGNOSIS — R7989 Other specified abnormal findings of blood chemistry: Secondary | ICD-10-CM | POA: Diagnosis not present

## 2021-03-11 DIAGNOSIS — R7401 Elevation of levels of liver transaminase levels: Secondary | ICD-10-CM | POA: Diagnosis not present

## 2021-03-11 DIAGNOSIS — E875 Hyperkalemia: Secondary | ICD-10-CM

## 2021-03-11 DIAGNOSIS — Z20822 Contact with and (suspected) exposure to covid-19: Secondary | ICD-10-CM | POA: Diagnosis not present

## 2021-03-11 HISTORY — DX: Diarrhea, unspecified: R19.7

## 2021-03-11 LAB — BLOOD CULTURE ID PANEL (REFLEXED) - BCID2

## 2021-03-11 LAB — COMPREHENSIVE METABOLIC PANEL
ALT: 99 U/L — ABNORMAL HIGH (ref 0–44)
AST: 81 U/L — ABNORMAL HIGH (ref 15–41)
Albumin: 3 g/dL — ABNORMAL LOW (ref 3.5–5.0)
Alkaline Phosphatase: 135 U/L — ABNORMAL HIGH (ref 38–126)
Anion gap: 20 — ABNORMAL HIGH (ref 5–15)
BUN: 75 mg/dL — ABNORMAL HIGH (ref 8–23)
CO2: 20 mmol/L — ABNORMAL LOW (ref 22–32)
Calcium: 8.7 mg/dL — ABNORMAL LOW (ref 8.9–10.3)
Chloride: 91 mmol/L — ABNORMAL LOW (ref 98–111)
Creatinine, Ser: 13.09 mg/dL — ABNORMAL HIGH (ref 0.61–1.24)
GFR, Estimated: 3 mL/min — ABNORMAL LOW (ref >60)
Glucose, Bld: 83 mg/dL (ref 70–99)
Potassium: 4.7 mmol/L (ref 3.5–5.1)
Sodium: 131 mmol/L — ABNORMAL LOW (ref 135–145)
Total Bilirubin: 1.1 mg/dL (ref 0.3–1.2)
Total Protein: 6.6 g/dL (ref 6.5–8.1)

## 2021-03-11 LAB — CBC
HCT: 31.1 % — ABNORMAL LOW (ref 39.0–52.0)
Hemoglobin: 10.4 g/dL — ABNORMAL LOW (ref 13.0–17.0)
MCH: 32.3 pg (ref 26.0–34.0)
MCHC: 33.4 g/dL (ref 30.0–36.0)
MCV: 96.6 fL (ref 80.0–100.0)
Platelets: 214 10*3/uL (ref 150–400)
RBC: 3.22 MIL/uL — ABNORMAL LOW (ref 4.22–5.81)
RDW: 15.6 % — ABNORMAL HIGH (ref 11.5–15.5)
WBC: 8.9 10*3/uL (ref 4.0–10.5)
nRBC: 0 % (ref 0.0–0.2)

## 2021-03-11 LAB — GLUCOSE, CAPILLARY
Glucose-Capillary: 47 mg/dL — ABNORMAL LOW (ref 70–99)
Glucose-Capillary: 100 mg/dL — ABNORMAL HIGH (ref 70–99)
Glucose-Capillary: 230 mg/dL — ABNORMAL HIGH (ref 70–99)

## 2021-03-11 LAB — HEPATITIS PANEL, ACUTE
HCV Ab: NONREACTIVE
Hep B C IgM: NONREACTIVE
Hepatitis B Surface Ag: NONREACTIVE

## 2021-03-11 LAB — HEMOGLOBIN A1C
Hgb A1c MFr Bld: 6.5 % — ABNORMAL HIGH (ref 4.8–5.6)
Mean Plasma Glucose: 140 mg/dL

## 2021-03-11 LAB — HCV INTERPRETATION

## 2021-03-11 LAB — PHOSPHORUS: Phosphorus: 8.6 mg/dL — ABNORMAL HIGH (ref 2.5–4.6)

## 2021-03-11 MED ORDER — ALTEPLASE 2 MG IJ SOLR
INTRAMUSCULAR | Status: AC
  Administered 2021-03-11: 4 mg
  Filled 2021-03-11: qty 4

## 2021-03-11 MED ORDER — CHLORHEXIDINE GLUCONATE CLOTH 2 % EX PADS
6.0000 | MEDICATED_PAD | Freq: Every day | CUTANEOUS | Status: DC
  Administered 2021-03-12 – 2021-03-13 (×2): 6 via TOPICAL

## 2021-03-11 MED ORDER — TECHNETIUM TC 99M MEBROFENIN IV KIT
5.5000 | PACK | Freq: Once | INTRAVENOUS | Status: AC | PRN
  Administered 2021-03-11: 5.5 via INTRAVENOUS
  Filled 2021-03-11: qty 6

## 2021-03-11 MED ORDER — HEPARIN SODIUM (PORCINE) 1000 UNIT/ML IJ SOLN
INTRAMUSCULAR | Status: AC
  Filled 2021-03-11: qty 2

## 2021-03-11 MED ORDER — HEPARIN SODIUM (PORCINE) 1000 UNIT/ML IJ SOLN
INTRAMUSCULAR | Status: AC
  Filled 2021-03-11: qty 4

## 2021-03-11 MED ORDER — AMIODARONE HCL 200 MG PO TABS
200.0000 mg | ORAL_TABLET | ORAL | Status: DC
  Administered 2021-03-12: 200 mg via ORAL
  Filled 2021-03-11: qty 1

## 2021-03-11 MED ORDER — DEXTROSE 50 % IV SOLN
INTRAVENOUS | Status: AC
  Administered 2021-03-11: 50 mL
  Filled 2021-03-11: qty 50

## 2021-03-11 NOTE — Progress Notes (Signed)
PHARMACY - PHYSICIAN COMMUNICATION CRITICAL VALUE ALERT - BLOOD CULTURE IDENTIFICATION (BCID)  [name] is an 80 y.o. male who presented to [facility] on 03/10/2021 with a chief complaint of N/V/D  Assessment:  1 of 4 blood cultures with staph epi  Name of physician (or Provider) ContactedManus Rudd, MD  Current antibiotics: none  Changes to prescribed antibiotics recommended:  None, likely contaminant  Results for orders placed or performed during the hospital encounter of 03/10/21  Blood Culture ID Panel (Reflexed) (Collected: 03/10/2021  3:50 PM)  Result Value Ref Range   Enterococcus faecalis NOT DETECTED NOT DETECTED   Enterococcus Faecium NOT DETECTED NOT DETECTED   Listeria monocytogenes NOT DETECTED NOT DETECTED   Staphylococcus species DETECTED (A) NOT DETECTED   Staphylococcus aureus (BCID) NOT DETECTED NOT DETECTED   Staphylococcus epidermidis DETECTED (A) NOT DETECTED   Staphylococcus lugdunensis NOT DETECTED NOT DETECTED   Streptococcus species NOT DETECTED NOT DETECTED   Streptococcus agalactiae NOT DETECTED NOT DETECTED   Streptococcus pneumoniae NOT DETECTED NOT DETECTED   Streptococcus pyogenes NOT DETECTED NOT DETECTED   A.calcoaceticus-baumannii NOT DETECTED NOT DETECTED   Bacteroides fragilis NOT DETECTED NOT DETECTED   Enterobacterales NOT DETECTED NOT DETECTED   Enterobacter cloacae complex NOT DETECTED NOT DETECTED   Escherichia coli NOT DETECTED NOT DETECTED   Klebsiella aerogenes NOT DETECTED NOT DETECTED   Klebsiella oxytoca NOT DETECTED NOT DETECTED   Klebsiella pneumoniae NOT DETECTED NOT DETECTED   Proteus species NOT DETECTED NOT DETECTED   Salmonella species NOT DETECTED NOT DETECTED   Serratia marcescens NOT DETECTED NOT DETECTED   Haemophilus influenzae NOT DETECTED NOT DETECTED   Neisseria meningitidis NOT DETECTED NOT DETECTED   Pseudomonas aeruginosa NOT DETECTED NOT DETECTED   Stenotrophomonas maltophilia NOT DETECTED NOT DETECTED    Candida albicans NOT DETECTED NOT DETECTED   Candida auris NOT DETECTED NOT DETECTED   Candida glabrata NOT DETECTED NOT DETECTED   Candida krusei NOT DETECTED NOT DETECTED   Candida parapsilosis NOT DETECTED NOT DETECTED   Candida tropicalis NOT DETECTED NOT DETECTED   Cryptococcus neoformans/gattii NOT DETECTED NOT DETECTED   Methicillin resistance mecA/C DETECTED (A) NOT DETECTED    Uvaldo Rising, BCPS, BCCP Clinical Pharmacist  03/11/2021 8:47 PM   [facility] pharmacy phone numbers are listed on [email or web address]

## 2021-03-11 NOTE — Progress Notes (Signed)
FPTS Interim Progress Note  S: Received a page from RN about patient's blood glucose being 47. Recieved IV Dextrose immediately. Patient evaluated at bedside.  He states that he was feeling dry in his mouth before nurse checked his blood glucose.  He states he has not eaten anything from last night.  He denies any symptoms now and feels little better and happy about his diet being liberated.  O: BP 125/70 (BP Location: Left Arm)   Pulse 77   Temp 98.4 F (36.9 C) (Oral)   Resp 16   Ht '5\' 7"'$  (1.702 m)   Wt 140 lb 14 oz (63.9 kg)   SpO2 100%   BMI 22.06 kg/m    General: Pleasant male lying comfortably in bed, NAD Cardiovascular: Regular rate and rhythm Respiratory: Clear to auscultation bilaterally, breathing normally on room air Abdomen: Soft, nondistended, nontender, bowel sounds present Extremities: No edema, able to move all his extremities Psych: Pleasant mood and affect Neuro: Alert, awake, oriented to time place and person   A/P:  -- Repeat blood glucose 100 -- Repeat vitals -- Liberated patient diet to renal/carb modified  Honor Junes, MD 03/11/2021, 4:37 PM PGY-1, [place] [facility] Service pager [phone]

## 2021-03-11 NOTE — Progress Notes (Signed)
Paged provider regarding blood sugar 47.   Provider called back

## 2021-03-11 NOTE — Progress Notes (Signed)
Hypoglycemic Event  CBG: 47  Treatment: Dextrose 50% Symptoms:CBG 47 Follow-up CBG: Time: 1559 CBG Result:100  Possible Reasons for Event:Patient NPO and had dialysis   Comments/MD notified:1550    Threasa Kinch [place]ers

## 2021-03-11 NOTE — Progress Notes (Signed)
[facility] Progress Note   Subjective:   Pt seen on HD unit. Unable to stand for weights/orthostatics this AM. No edema or SOB. Denies CP, palpitations, dizziness, abdominal pain and nausea this AM. Feels his EDW likely needs to be raised slightly as it has been a challenge to meet it for the past several weeks. Thigh TDC with poor blood return, will use TPA dwell.   Objective Vitals:   03/10/21 1524 03/10/21 1538 03/10/21 2243 03/11/21 0441  BP: 131/76  138/81 127/77  Pulse: 65  69 72  Resp: '17  16 16  '$ Temp: 98.1 F (36.7 C)  98.1 F (36.7 C) 98.1 F (36.7 C)  TempSrc: Oral  Oral Oral  SpO2: 100%  100% 100%  Weight:  63.9 kg    Height:  '5\' 7"'$  (1.702 m)     Physical Exam General: WDWN male, alert and in NAD Heart: RRR, no murmurs, rubs or gallops Lungs: CTA bilaterally without wheezing, rhonchi or rales Abdomen: Soft, non-distended, +BS. No ascites noted Extremities: No edema b/l lower extremities Dialysis Access: Thigh TDC with poor blood return, no erythema/edema/drainage  Additional Objective Labs: Basic Metabolic Panel: Recent Labs  Lab 03/10/21 1029 03/11/21 0333  NA 133* 131*  K 5.8* 4.7  CL 93* 91*  CO2 23 20*  GLUCOSE 160* 83  BUN 61* 75*  CREATININE 12.81* 13.09*  CALCIUM 9.1 8.7*  PHOS  --  8.6*   Liver Function Tests: Recent Labs  Lab 03/10/21 1029 03/11/21 0333  AST 108* 81*  ALT 101* 99*  ALKPHOS 152* 135*  BILITOT 1.3* 1.1  PROT 7.5 6.6  ALBUMIN 3.4* 3.0*   Recent Labs  Lab 03/10/21 1420  LIPASE 42   CBC: Recent Labs  Lab 03/10/21 1029 03/11/21 0513  WBC 11.1* 8.9  NEUTROABS 9.1*  --   HGB 11.6* 10.4*  HCT 36.2* 31.1*  MCV 100.0 96.6  PLT 228 214   Blood Culture    Component Value Date/Time   SDES BLOOD LEFT ANTECUBITAL 09/08/2020 1337   SDES BLOOD RIGHT ANTECUBITAL 09/08/2020 1337   SPECREQUEST  09/08/2020 1337    BOTTLES DRAWN AEROBIC AND ANAEROBIC Blood Culture results may not be optimal due to an  excessive volume of blood received in culture bottles   SPECREQUEST  09/08/2020 1337    BOTTLES DRAWN AEROBIC AND ANAEROBIC Blood Culture adequate volume   CULT  09/08/2020 1337    NO GROWTH 5 DAYS Performed at [facility] [address], [place], [place] [address]    CULT  09/08/2020 1337    NO GROWTH 5 DAYS Performed at [facility], [place] [address], [place], [place] [address]    REPTSTATUS 09/13/2020 FINAL 09/08/2020 1337   REPTSTATUS 09/13/2020 FINAL 09/08/2020 1337   CBG: Recent Labs  Lab 03/10/21 1617 03/10/21 2053  GLUCAP 89 119*   Studies/Results: CT ABDOMEN PELVIS WO CONTRAST  Result Date: 03/10/2021 CLINICAL DATA:  Right lower quadrant abdominal pain. EXAM: CT ABDOMEN AND PELVIS WITHOUT CONTRAST TECHNIQUE: Multidetector CT imaging of the abdomen and pelvis was performed following the standard protocol without IV contrast. COMPARISON:  November 09, 2018 FINDINGS: Lower chest: Partially visualized cardiac pacemaker leads. Right femoral vein catheter terminates in the inferior vena cava. Hepatobiliary: Minimal perihepatic ascites. Normal appearance of the liver. Indistinct appearance of the gallbladder. Fat stranding at porta hepatis. Pancreas: Unremarkable. No pancreatic ductal dilatation or surrounding inflammatory changes. Spleen: Mild perisplenic ascites. Adrenals/Urinary Tract: Adrenal glands are unremarkable. Kidneys are  normal, without renal calculi, focal lesion, or hydronephrosis. Increased attenuation of the urinary bladder contents. Stomach/Bowel: Normal appearance of the stomach. Two duodenal diverticula measuring 3.1 and 3.9 cm. No evidence of appendicitis. Scattered diverticulosis without evidence of diverticulitis. Vascular/Lymphatic: Aortic atherosclerosis. No enlarged abdominal or pelvic lymph nodes. Reproductive: Small surgically absent prostate gland. Other: No abdominal wall hernia or abnormality. No abdominopelvic ascites. Musculoskeletal: Mild  spondylosis of the lumbosacral spine. IMPRESSION: 1. No evidence of appendicitis. 2. Indistinct appearance of the gallbladder. Fat stranding at the porta hepatis. These findings may represent periportal edema, which may be seen with congestive heart failure, liver congestion or hepatitis. Alternatively, acute cholecystitis may be considered. Please correlate clinically. 3. Small perihepatic and perisplenic ascites. 4. Increased attenuation of the urinary bladder contents. Please correlate to urinalysis and/or sonographic evaluation with full bladder. 5. Scattered diverticulosis without evidence of diverticulitis. 6. Aortic atherosclerosis. Aortic Atherosclerosis (ICD10-I70.0). Electronically Signed   By: Fidela Salisbury M.D.   On: 03/10/2021 15:36   DG Chest 2 View  Result Date: 03/10/2021 CLINICAL DATA:  80 year old male with history of weakness. Nausea, vomiting and diarrhea. EXAM: CHEST - 2 VIEW COMPARISON:  Chest x-ray 10/21/2020. FINDINGS: Lung volumes are normal. No consolidative airspace disease. No pleural effusions. No pneumothorax. No pulmonary nodule or mass noted. Pulmonary vasculature and the cardiomediastinal silhouette are within normal limits. Atherosclerosis in the thoracic aorta. Left-sided pacemaker in place with lead tips projecting over the expected location of the right atrium and right ventricle. IMPRESSION: 1. No radiographic evidence of acute cardiopulmonary disease. 2. Aortic atherosclerosis. Electronically Signed   By: Vinnie Langton M.D.   On: 03/10/2021 10:50   CT Head Wo Contrast  Result Date: 03/10/2021 CLINICAL DATA:  80 year old male with history of mental status change. Weakness. EXAM: CT HEAD WITHOUT CONTRAST TECHNIQUE: Contiguous axial images were obtained from the base of the skull through the vertex without intravenous contrast. COMPARISON:  Head CT 10/21/2020. FINDINGS: Brain: Moderate cerebral and mild cerebellar atrophy. Patchy and confluent areas of decreased  attenuation are noted throughout the deep and periventricular white matter of the cerebral hemispheres bilaterally, compatible with chronic microvascular ischemic disease. Well-defined areas of low attenuation in the left basal ganglia, right pons and left cerebellar hemisphere, compatible with old infarcts. No evidence of acute infarction, hemorrhage, hydrocephalus, extra-axial collection or mass lesion/mass effect. Vascular: No hyperdense vessel or unexpected calcification. Skull: Normal. Negative for fracture or focal lesion. Sinuses/Orbits: No acute finding. Other: None. IMPRESSION: 1. No acute intracranial abnormalities. 2. Moderate cerebral and mild cerebellar atrophy with extensive chronic microvascular ischemic changes and old lacunar infarcts, as above. Electronically Signed   By: Vinnie Langton M.D.   On: 03/10/2021 11:20   US Abdomen Limited RUQ (LIVER/GB)  Result Date: 03/10/2021 CLINICAL DATA:  80 year old male with abdominal pain. EXAM: ULTRASOUND ABDOMEN LIMITED RIGHT UPPER QUADRANT COMPARISON:  CT of the abdomen pelvis dated 03/10/2021. FINDINGS: Gallbladder: No gallstone. There is diffuse thickened appearance of the gallbladder wall measuring 6 mm. Negative sonographic Murphy's sign. No pericholecystic fluid. Small perihepatic ascites noted on the earlier CT. Common bile duct: Diameter: 3 mm Liver: No focal lesion identified. Within normal limits in parenchymal echogenicity. Portal vein is patent on color Doppler imaging with normal direction of blood flow towards the liver. Other: None. IMPRESSION: Diffusely thickened gallbladder wall may be related to underlying liver or systemic pathology. Acute acalculous cholecystitis is less likely. A hepatobiliary scintigraphy may provide better evaluation of the gallbladder if there is a high clinical concern  for acute cholecystitis . Electronically Signed   By: Anner Crete M.D.   On: 03/10/2021 16:44   Medications: . ceFEPime (MAXIPIME) IV 1 g  (03/10/21 1743)  . metronidazole 500 mg (03/11/21 0219)   . heparin sodium (porcine)      . aspirin  81 mg Oral Daily  . [START ON 03/12/2021] calcitRIOL  1.25 mcg Oral Q M,W,F-HD  . Chlorhexidine Gluconate Cloth  6 each Topical Q0600  . heparin  5,000 Units Subcutaneous Q8H  . lanthanum  500 mg Oral TID WC    Outpatient Dialysis Orders:  MWF [facility]    4h  400/1.5  59.5kg  2/2 bath  P4 TDC  Hep 4000    - venofer 50 weekly, no esa  - calcitriol 1.25 ug tiw po  - home: lanthanum 1 tab tid ac, no BP lowering meds  Assessment/Plan: 1. Generalized weakness - w/ some GI symptoms, diarrhea x 1 day. CT with gallblader dz vs. Fluid overload, [place] suggestive of underlying liver or systemic pathology but not acute acalculous cholecystitis, small perihepatic ascites. No fevers. Recommend getting blood cx's w/ indwelling femoral HD cath so we don't miss cath sepsis.  2. ESRD - HD MWF but treatment rolled over to today due to high patient census. Over EDW by bed weights but suspect some weight gain as below.  3. BP/ volume - BP's stable, no vol excess on exam, small ascites on [place]. CXR neg. Set for just 1L UF today. Pt last admit was gen weakness and 2 priors were for syncope so will be cautious with fluid removal. Hx NSVT.  4. Diarrhea - acute, per primary team 5. ^LFT's - per pmd 6. Hx CAD/ NSVT/ sp PPM   Anice Paganini, PA-C 03/11/2021, 8:17 AM  [facility] Pager: [phone]

## 2021-03-11 NOTE — Hospital Course (Addendum)
William [name] is a 80 y.o. male presenting with generalized weakness x 4 days and 2 episodes of watery diarrhea x 1 day . PMH is significant for ESRD on HD MWF, CAD, HTN, DM,hx multiple falls, subdural hematoma, nonsustained v-tach s/p pacemaker   Generalized weakness, likely to HD and poor PO inake Patient presents with c/o generalized weakness starting last Friday after dialysis.  Notes, being a chronic problem but unable to care for himself this morning after having two watery stools and presented to ED.  Denies running fever, vomiting, bloody diarrhea, sick contacts at home.  Denies syncope, recent fall, seizure-like activity.  Patient temperature 98.2, BP 102/67, HR80, RR 15, eating normally on room air.  EKG in normal sinus rhythm with QTC 522.  COVID-negative.  WBC 11.1, Hb 11.6, K+ 5.8, glucose 160, BUN 61, CR 12.81, AST 108, ALT 101.  CT chest does not show any acute cardiopulmonary disease. CT Head does not show any acute intracranial abnormality.  On chart review, it looks like this is patient's chronic problem from recent admissions and outpatient visits.  He is also seen palliative care recently along with his daughters for goals of care (only initial visit).   As per nephro, they suggest to do blood culture to rule out sepsis as patient with tunneled cath in leg and sometimes that can present with GI symptoms. Suspected.weakness either due to HD session vs poor p.o. intake vs sepsis due to either acute cholecystitis or tunnel cath in leg.  Rule out sepsis, blood culture only grew staph epidermidis, considering that as a contaminant   ESRD on HD MWF.   Patient missed his dialysis. K+ 5.8, creatinine 12.81, BUN 61. Dry weight 59.5 Kg.  Nephrology consulted and due to under staffing they would put patient on schedule for 5/31  Patient mentions his desire to switch to peritoneal dialysis but amount of work he has to put into it and pushed back he is getting.  On chart review, his vascular surgeon  recommended him to do peritoneal dialysis due to his ongoing passing out, weakness, low blood pressures, falls after hemodialysis.    RUQ pain  Patient presents with 2 episodes of watery diarrhea this morning along with some nausea.  Also complaining of mid gastric abdominal pain which is resolved now.  On palpation patient has right upper quadrant pain, + Murphy's sign. CT scan represents periportal edema can be due to liver congestion or hepatitis or acute cholecystitis.  Patient has right upper quadrant pain, CT suggestive of acute cholecystitis, mild leukocytosis 11.1, transaminitis AST 108, ALT 101, total bilirubin 1.3 but normal lipase of 42.  Right upper ultrasound abdomen showed a diffusely thickened gallbladder, related to possible underlying liver or systemic pathology and acute acalculous cholecystitis seems less likely.  --IV Cefepime (5/30-) and IV Flagyll (5/30-). HIDA scan negative and IV antibiotics were discontinued.    Transaminitis AST 108, ALT 101.  This is new for this patient.  It might be because of either amiodarone, due to decreased p.o. intake versus hepatitis versus fatty liver.  Patient denies any alcohol use history.  Hepatitis panel negative. Cardiology consulted and they changed amiodarone to Metoprolol 25 mg BID to avoid hepatotoxicity.   Chest pain Chest pain, nonspecific but non anginal. Baseline EKG with paced rhythm. Initial troponin very similar to prior and minimally elevated and flat later.   All other chronic conditions stable during admission

## 2021-03-11 NOTE — Progress Notes (Addendum)
Family Medicine Teaching Service Daily Progress Note Intern Pager: [phone]  Patient name: William [name] Medical record number: [id number] Date of birth: [birth year] Age: 80 y.o. Gender: male  Primary Care Provider: Matilde Haymaker, MD Consultants: Nephro, Surgery Code Status: Full  Pt Overview and Major Events to Date:  03/10/2021: Admitted to FPTS  Assessment and Plan: Jameil Sussman is a 80 year old male presented with generalized weakness x 4days and 2 episodes of watery diarrhea x1 day.  PMH is significant for ESRD on HD MWF, CAD, HTN, DM, history of multiple falls, subdural hematoma, nonsustained V. Tach s/p pacemaker.  Generalized weakness, likely due to HD Patient feels better this morning and plan for HD today as per nephrology.  Presented with generalized weakness yesterday, being a chronic problem.  Denies nausea, vomiting, diarrhea, cough, shortness of breath today.  Afebrile, HR 72, RR 16, BP 127/77 breathing normally on room air.  WBC 8.9, TSH 038, hep B nonreactive, hep C nonreactive, blood glucose 83.  Blood culture does not show any growth. -- Vitals per floor -- Orthostatic vitals -- PT and OT eval and treat -- CBC a.m. -- BMP a.m. -- f/u blood culture  ESRD on HD MWF Patient scheduled for dialysis this morning.  4.7, creatinine 13.09, BUN 75.  Patient missed dialysis yesterday.  Nephrology following.  Also suggested to do blood culture to rule out sepsis as patient with tunnel cath in leg and that can sometimes present with GI symptoms.  Blood culture does not show growth in 24 hours. -- Appreciate nephrology assistance -- CMP in a.m. -- Dialysis as per nephrology -- Avoid nephrotoxic agents  RUQ pain Patient denies any abdominal pain today, mild right upper quadrant abdominal pain on exam.  CT scan represents periportal edema which can be due to liver congestion or hepatitis or acute cholecystitis.  Right upper quadrant ultrasound showed diffusely thickened  gallbladder, related to possible underlying liver or systemic pathology and acute acalculous cholecystitis seems less likely.  We are thinking either acute acalculous cholecystitis versus fluid overload.  Surgery consulted this morning and they suggested to undergo HIDA scan and would follow up with patient if HIDA scan turns out to be positive -- f/u HIDA scan -- Monitor closely -- IV cefepime (5/30-) -- IV Flagyl (5/30-)  Transaminitis-improving AST 81, ALT 99 improving from yesterday 108, 101.  Can be either due to amiodarone, decreased p.o. intake vs acute cholecystitis.  Hepatitis B and C nonreactive.  -- Monitor closely -- Avoid hepatotoxic agents  CAD I Hx of V-tach  Home meds: Atorvastatin 40 mg daily, aspirin 81 mg, amiodarone 200 mg MWF.  -- Cardiac telemetry -- Amiodarone on dialysis day for hx of V-tach -- Hold atorvastatin due to elevated AST, ALT -- c/w ASA  Hx of AT s/p Pacemaker  -- Pacemaker interrogated last on 02/26/2021, nonconcerning  Hx of GERD Meds: Pepcid 10 mg daily.  Patient denies taking it at home  T2DM HbA1c: 6.5%.  Patient not on any home medication for diabetes mellitus -- Monitor closely  Goals of care Pt recently saw palliative care outpatient for his initial visit -- Continue the conversation  FEN/GI: N.p.o. for HIDA scan PPx: Sub Q Heparin   Status is: Inpatient  The patient will require care spanning > 2 midnights and should be moved to inpatient because: Ongoing diagnostic testing needed not appropriate for outpatient work up  Dispo:  Patient From: Home  Planned Disposition: To be determined  Medically stable for discharge: No  Subjective:  No acute overnight events. Patient evaluated at bedside this morning.  States he feels better this morning and denies any abdominal pain.  Denies any other complaints except feeling general weakness.  Objective: Temp:  [98.1 F (36.7 C)-98.4 F (36.9 C)] 98.4 F (36.9 C) (05/31  0945) Pulse Rate:  [63-77] 77 (05/31 1300) Resp:  [16] 16 (05/31 1300) BP: (114-138)/(69-96) 125/70 (05/31 1300) SpO2:  [100 %] 100 % (05/31 0945) Physical Exam: General: Pleasant frail elderly man laying comfortably in bed, NAD Cardiovascular: RRR, no murmur/rubs/gallops Respiratory: Clear to auscultation bilaterally, breathing normally on room air Abdomen:, Bowel sounds present, mildly tender on right upper quadrant Extremities: No edema appreciated Psych: Normal mood and affect  Laboratory: Recent Labs  Lab 03/10/21 1029 03/11/21 0513  WBC 11.1* 8.9  HGB 11.6* 10.4*  HCT 36.2* 31.1*  PLT 228 214   Recent Labs  Lab 03/10/21 1029 03/11/21 0333  NA 133* 131*  K 5.8* 4.7  CL 93* 91*  CO2 23 20*  BUN 61* 75*  CREATININE 12.81* 13.09*  CALCIUM 9.1 8.7*  PROT 7.5 6.6  BILITOT 1.3* 1.1  ALKPHOS 152* 135*  ALT 101* 99*  AST 108* 81*  GLUCOSE 160* 83    Imaging/Diagnostic Tests: CT ABDOMEN PELVIS WO CONTRAST  Result Date: 03/10/2021 CLINICAL DATA:  Right lower quadrant abdominal pain. EXAM: CT ABDOMEN AND PELVIS WITHOUT CONTRAST TECHNIQUE: Multidetector CT imaging of the abdomen and pelvis was performed following the standard protocol without IV contrast. COMPARISON:  November 09, 2018 FINDINGS: Lower chest: Partially visualized cardiac pacemaker leads. Right femoral vein catheter terminates in the inferior vena cava. Hepatobiliary: Minimal perihepatic ascites. Normal appearance of the liver. Indistinct appearance of the gallbladder. Fat stranding at porta hepatis. Pancreas: Unremarkable. No pancreatic ductal dilatation or surrounding inflammatory changes. Spleen: Mild perisplenic ascites. Adrenals/Urinary Tract: Adrenal glands are unremarkable. Kidneys are normal, without renal calculi, focal lesion, or hydronephrosis. Increased attenuation of the urinary bladder contents. Stomach/Bowel: Normal appearance of the stomach. Two duodenal diverticula measuring 3.1 and 3.9 cm. No  evidence of appendicitis. Scattered diverticulosis without evidence of diverticulitis. Vascular/Lymphatic: Aortic atherosclerosis. No enlarged abdominal or pelvic lymph nodes. Reproductive: Small surgically absent prostate gland. Other: No abdominal wall hernia or abnormality. No abdominopelvic ascites. Musculoskeletal: Mild spondylosis of the lumbosacral spine. IMPRESSION: 1. No evidence of appendicitis. 2. Indistinct appearance of the gallbladder. Fat stranding at the porta hepatis. These findings may represent periportal edema, which may be seen with congestive heart failure, liver congestion or hepatitis. Alternatively, acute cholecystitis may be considered. Please correlate clinically. 3. Small perihepatic and perisplenic ascites. 4. Increased attenuation of the urinary bladder contents. Please correlate to urinalysis and/or sonographic evaluation with full bladder. 5. Scattered diverticulosis without evidence of diverticulitis. 6. Aortic atherosclerosis. Aortic Atherosclerosis (ICD10-I70.0). Electronically Signed   By: Fidela Salisbury M.D.   On: 03/10/2021 15:36   DG Chest 2 View  Result Date: 03/10/2021 CLINICAL DATA:  80 year old male with history of weakness. Nausea, vomiting and diarrhea. EXAM: CHEST - 2 VIEW COMPARISON:  Chest x-ray 10/21/2020. FINDINGS: Lung volumes are normal. No consolidative airspace disease. No pleural effusions. No pneumothorax. No pulmonary nodule or mass noted. Pulmonary vasculature and the cardiomediastinal silhouette are within normal limits. Atherosclerosis in the thoracic aorta. Left-sided pacemaker in place with lead tips projecting over the expected location of the right atrium and right ventricle. IMPRESSION: 1. No radiographic evidence of acute cardiopulmonary disease. 2. Aortic atherosclerosis. Electronically Signed   By: Mauri Brooklyn.D.  On: 03/10/2021 10:50   CT Head Wo Contrast  Result Date: 03/10/2021 CLINICAL DATA:  80 year old male with history of  mental status change. Weakness. EXAM: CT HEAD WITHOUT CONTRAST TECHNIQUE: Contiguous axial images were obtained from the base of the skull through the vertex without intravenous contrast. COMPARISON:  Head CT 10/21/2020. FINDINGS: Brain: Moderate cerebral and mild cerebellar atrophy. Patchy and confluent areas of decreased attenuation are noted throughout the deep and periventricular white matter of the cerebral hemispheres bilaterally, compatible with chronic microvascular ischemic disease. Well-defined areas of low attenuation in the left basal ganglia, right pons and left cerebellar hemisphere, compatible with old infarcts. No evidence of acute infarction, hemorrhage, hydrocephalus, extra-axial collection or mass lesion/mass effect. Vascular: No hyperdense vessel or unexpected calcification. Skull: Normal. Negative for fracture or focal lesion. Sinuses/Orbits: No acute finding. Other: None. IMPRESSION: 1. No acute intracranial abnormalities. 2. Moderate cerebral and mild cerebellar atrophy with extensive chronic microvascular ischemic changes and old lacunar infarcts, as above. Electronically Signed   By: Vinnie Langton M.D.   On: 03/10/2021 11:20   NM Hepatobiliary Liver Func  Result Date: 03/11/2021 CLINICAL DATA:  Recurrent biliary colic. EXAM: NUCLEAR MEDICINE HEPATOBILIARY IMAGING TECHNIQUE: Sequential images of the abdomen were obtained out to 60 minutes following intravenous administration of radiopharmaceutical. RADIOPHARMACEUTICALS:  5.5 mCi Tc-51m Choletec IV COMPARISON:  Mar 10, 2021. FINDINGS: Prompt uptake and biliary excretion of activity by the liver is seen. Gallbladder activity is visualized, consistent with patency of cystic duct. Biliary activity passes into small bowel, consistent with patent common bile duct. IMPRESSION: Normal uptake is noted within the gallbladder consistent with patent cystic duct. Electronically Signed   By: JMarijo ConceptionM.D.   On: 03/11/2021 15:13   [place] Abdomen Limited RUQ (LIVER/GB)  Result Date: 03/10/2021 CLINICAL DATA:  80year old male with abdominal pain. EXAM: ULTRASOUND ABDOMEN LIMITED RIGHT UPPER QUADRANT COMPARISON:  CT of the abdomen pelvis dated 03/10/2021. FINDINGS: Gallbladder: No gallstone. There is diffuse thickened appearance of the gallbladder wall measuring 6 mm. Negative sonographic Murphy's sign. No pericholecystic fluid. Small perihepatic ascites noted on the earlier CT. Common bile duct: Diameter: 3 mm Liver: No focal lesion identified. Within normal limits in parenchymal echogenicity. Portal vein is patent on color Doppler imaging with normal direction of blood flow towards the liver. Other: None. IMPRESSION: Diffusely thickened gallbladder wall may be related to underlying liver or systemic pathology. Acute acalculous cholecystitis is less likely. A hepatobiliary scintigraphy may provide better evaluation of the gallbladder if there is a high clinical concern for acute cholecystitis . Electronically Signed   By: AAnner CreteM.D.   On: 03/10/2021 16:44    Daphanie Oquendo, AMeredith Staggers MD 03/11/2021, 3:53 PM PGY-1, CNorth LibertyIntern pager: [phone] text pages welcome

## 2021-03-11 NOTE — Progress Notes (Signed)
HIDA scan is negative for cholecystitis. US findings likely secondary to possible fluid overload status from ESRD and other medical conditions.  LFTs can be see elevated in the setting of hepatic congestion  No stones.  Patent cystic duct on HIDA indicating no cholecystitis.   No surgical indications.    [place] PM 03/11/2021

## 2021-03-11 NOTE — Progress Notes (Signed)
FPTS Interim Progress Note  Received report regarding Staph Epi in 1 of 4 tubes per Pharmacy.  Thought likely to be contaminant.  No plan with change of management at this time.  Delora Fuel, MD 03/11/2021, 8:38 PM PGY-1, [facility] pager [phone]

## 2021-03-11 NOTE — Progress Notes (Signed)
Phone conversation with general surgery about this patient with Claiborne Billings, [place].  Informed about patient presentation and imaging studies and surgery suggest to obtain a HIDA scan for which patient need to be n.p.o. for 6 hours and no narcotics for 6 hours.  Patient is on list-if HIDA scan comes back positive, they will come and evaluate the patient.  If HIDA scan comes negative, did not think there will be any need to evaluate patient. -- HIDA scan ordered  Honor Junes, MD PGY-1, Resident

## 2021-03-12 ENCOUNTER — Observation Stay (HOSPITAL_COMMUNITY): Payer: Medicare PPO

## 2021-03-12 DIAGNOSIS — R072 Precordial pain: Secondary | ICD-10-CM | POA: Diagnosis not present

## 2021-03-12 DIAGNOSIS — I472 Ventricular tachycardia: Secondary | ICD-10-CM

## 2021-03-12 DIAGNOSIS — Z992 Dependence on renal dialysis: Secondary | ICD-10-CM

## 2021-03-12 DIAGNOSIS — R531 Weakness: Secondary | ICD-10-CM | POA: Diagnosis not present

## 2021-03-12 DIAGNOSIS — Z20822 Contact with and (suspected) exposure to covid-19: Secondary | ICD-10-CM | POA: Diagnosis not present

## 2021-03-12 DIAGNOSIS — R7401 Elevation of levels of liver transaminase levels: Secondary | ICD-10-CM | POA: Diagnosis not present

## 2021-03-12 DIAGNOSIS — R079 Chest pain, unspecified: Secondary | ICD-10-CM | POA: Diagnosis not present

## 2021-03-12 DIAGNOSIS — R7989 Other specified abnormal findings of blood chemistry: Secondary | ICD-10-CM | POA: Diagnosis not present

## 2021-03-12 DIAGNOSIS — E875 Hyperkalemia: Secondary | ICD-10-CM | POA: Diagnosis not present

## 2021-03-12 DIAGNOSIS — N186 End stage renal disease: Secondary | ICD-10-CM | POA: Diagnosis not present

## 2021-03-12 DIAGNOSIS — E119 Type 2 diabetes mellitus without complications: Secondary | ICD-10-CM

## 2021-03-12 LAB — BASIC METABOLIC PANEL
Anion gap: 13 (ref 5–15)
BUN: 43 mg/dL — ABNORMAL HIGH (ref 8–23)
CO2: 25 mmol/L (ref 22–32)
Calcium: 9 mg/dL (ref 8.9–10.3)
Chloride: 94 mmol/L — ABNORMAL LOW (ref 98–111)
Creatinine, Ser: 9.21 mg/dL — ABNORMAL HIGH (ref 0.61–1.24)
GFR, Estimated: 5 mL/min — ABNORMAL LOW (ref >60)
Glucose, Bld: 108 mg/dL — ABNORMAL HIGH (ref 70–99)
Potassium: 3.9 mmol/L (ref 3.5–5.1)
Sodium: 132 mmol/L — ABNORMAL LOW (ref 135–145)

## 2021-03-12 LAB — TROPONIN I (HIGH SENSITIVITY)
Troponin I (High Sensitivity): 31 ng/L — ABNORMAL HIGH (ref <18)
Troponin I (High Sensitivity): 28 ng/L — ABNORMAL HIGH (ref <18)

## 2021-03-12 LAB — CORTISOL-AM, BLOOD: Cortisol - AM: 10.2 ug/dL (ref 6.7–22.6)

## 2021-03-12 LAB — CBC
HCT: 30.7 % — ABNORMAL LOW (ref 39.0–52.0)
Hemoglobin: 10.2 g/dL — ABNORMAL LOW (ref 13.0–17.0)
MCH: 32.3 pg (ref 26.0–34.0)
MCHC: 33.2 g/dL (ref 30.0–36.0)
MCV: 97.2 fL (ref 80.0–100.0)
Platelets: 214 10*3/uL (ref 150–400)
RBC: 3.16 MIL/uL — ABNORMAL LOW (ref 4.22–5.81)
RDW: 15.5 % (ref 11.5–15.5)
WBC: 8 10*3/uL (ref 4.0–10.5)
nRBC: 0 % (ref 0.0–0.2)

## 2021-03-12 LAB — HEPATIC FUNCTION PANEL
ALT: 132 U/L — ABNORMAL HIGH (ref 0–44)
AST: 72 U/L — ABNORMAL HIGH (ref 15–41)
Albumin: 3.1 g/dL — ABNORMAL LOW (ref 3.5–5.0)
Alkaline Phosphatase: 117 U/L (ref 38–126)
Bilirubin, Direct: 0.1 mg/dL (ref 0.0–0.2)
Indirect Bilirubin: 0.6 mg/dL (ref 0.3–0.9)
Total Bilirubin: 0.7 mg/dL (ref 0.3–1.2)
Total Protein: 7.6 g/dL (ref 6.5–8.1)

## 2021-03-12 MED ORDER — METOPROLOL TARTRATE 25 MG PO TABS
25.0000 mg | ORAL_TABLET | Freq: Two times a day (BID) | ORAL | Status: DC
  Administered 2021-03-13 (×2): 25 mg via ORAL
  Filled 2021-03-12 (×2): qty 1

## 2021-03-12 MED ORDER — FAMOTIDINE 20 MG PO TABS
10.0000 mg | ORAL_TABLET | Freq: Every day | ORAL | Status: DC
  Administered 2021-03-13: 10 mg via ORAL
  Filled 2021-03-12: qty 1

## 2021-03-12 NOTE — Evaluation (Signed)
Occupational Therapy Evaluation Patient Details Name: William [name] MRN: [id number] DOB: [birth year] Today's Date: 03/12/2021    History of Present Illness Pt is a 80 year old man admitted iwth N/V/D, generalized weakness and difficulty taking care of himself. He lives with his daughter and her children and walks with a cane at baseline. PMH: ESRD, PAD, PPM, anemia, sarcoidosis, DM2, CHF, HTN, CAD, SDU with craniotomy.   Clinical Impression   Pt was sponge bathing and dressing independently and helping his daughter with housekeeping and meal prep prior to admission. Presents with generalized weakness and balance deficits requiring set up to min guard assist. Will follow acutely, but do not anticipate need for follow up OT upon discharge. His daughter works from home and can provide 24 hour care.     Follow Up Recommendations  No OT follow up    Equipment Recommendations  None recommended by OT    Recommendations for Other Services       Precautions / Restrictions Precautions Precautions: Fall Precaution Comments: has fallen 2 x in 6 months, permanent HD cath in L LE      Mobility Bed Mobility Overal bed mobility: Modified Independent             General bed mobility comments: HOB up    Transfers Overall transfer level: Needs assistance Equipment used: None Transfers: Sit to/from Stand Sit to Stand: Min guard         General transfer comment: held onto bed rail and foot board as he walked in room in absence of a cane    Balance Overall balance assessment: Needs assistance   Sitting balance-Leahy Scale: Good Sitting balance - Comments: no LOB donning socks     Standing balance-Leahy Scale: Fair                             ADL either performed or assessed with clinical judgement   ADL Overall ADL's : Needs assistance/impaired Eating/Feeding: Independent   Grooming: Min guard;Standing;Wash/dry hands   Upper Body Bathing: Set up;Sitting    Lower Body Bathing: Min guard;Sit to/from stand   Upper Body Dressing : Set up;Sitting   Lower Body Dressing: Sit to/from stand   Toilet Transfer: Min guard;Ambulation   Toileting- Clothing Manipulation and Hygiene: Min guard;Sit to/from stand       Functional mobility during ADLs: Min guard       Vision Patient Visual Report: No change from baseline       Perception     Praxis      Pertinent Vitals/Pain       Hand Dominance Right   Extremity/Trunk Assessment Upper Extremity Assessment Upper Extremity Assessment: Overall WFL for tasks assessed   Lower Extremity Assessment Lower Extremity Assessment: Defer to PT evaluation   Cervical / Trunk Assessment Cervical / Trunk Assessment: Kyphotic   Communication Communication Communication: No difficulties   Cognition Arousal/Alertness: Awake/alert Behavior During Therapy: WFL for tasks assessed/performed Overall Cognitive Status: Within Functional Limits for tasks assessed                                     General Comments       Exercises     Shoulder Instructions      Home Living Family/patient expects to be discharged to:: Private residence Living Arrangements: Children;Other relatives (3 grandchildren) Available Help at Discharge: Family;Available 24  hours/day Type of Home: House Home Access: Stairs to enter [employer] of Steps: 3 Entrance Stairs-Rails: Left Home Layout: Multi-level;Able to live on main level with bedroom/bathroom     Bathroom Shower/Tub: Other (comment) (sponge bathes)   Bathroom Toilet: Handicapped height     Home Equipment: [place] - single point;Walker - 2 wheels;Wheelchair - manual          Prior Functioning/Environment Level of Independence: Independent with assistive device(s)        Comments: uses a cane, sponge bathes, helps daughter with with meals and housekeeping        OT Problem List: Decreased strength;Decreased activity  tolerance;Impaired balance (sitting and/or standing)      OT Treatment/Interventions: Self-care/ADL training;DME and/or AE instruction;Therapeutic activities;Patient/family education;Balance training    OT Goals(Current goals can be found in the care plan section) Acute Rehab OT Goals Patient Stated Goal: return home OT Goal Formulation: With patient Time For Goal Achievement: 03/26/21 Potential to Achieve Goals: Good ADL Goals Pt Will Perform Grooming: with modified independence;standing Pt Will Transfer to Toilet: with modified independence;ambulating;regular height toilet Pt Will Perform Toileting - Clothing Manipulation and hygiene: with modified independence;sit to/from stand  OT Frequency: Min 2X/week   Barriers to D/C:            Co-evaluation              AM-PAC OT "6 Clicks" Daily Activity     Outcome Measure Help from another person eating meals?: None Help from another person taking care of personal grooming?: A Little Help from another person toileting, which includes using toliet, bedpan, or urinal?: A Little Help from another person bathing (including washing, rinsing, drying)?: None Help from another person to put on and taking off regular upper body clothing?: A Little   6 Click Score: 17   End of Session Equipment Utilized During Treatment: Gait belt  Activity Tolerance: Patient tolerated treatment well Patient left: in chair;with call bell/phone within reach;with chair alarm set (MD in room)  OT Visit Diagnosis: Unsteadiness on feet (R26.81);Muscle weakness (generalized) (M62.81)                Time: [id number] OT Time Calculation (min): 23 min Charges:  OT General Charges $OT Visit: 1 Visit OT Evaluation $OT Eval Low Complexity: 1 Low OT Treatments $Self Care/Home Management : 8-22 mins  Nestor Lewandowsky, OTR/L Acute Rehabilitation Services Pager: [phone] Office: [phone]  Malka So 03/12/2021, 9:34 AM

## 2021-03-12 NOTE — Progress Notes (Signed)
Family Medicine Teaching Service Daily Progress Note Intern Pager: [phone]  Patient name: William [name] Medical record number: [id number] Date of birth: [birth year] Age: 80 y.o. Gender: male  Primary Care Provider: Matilde Haymaker, MD Consultants: Nephro, surgery, cardiology Code Status: Full  Pt Overview and Major Events to Date:  03/10/2021: Admitted to [facility]  Assessment and Plan: William [name] is a 80 year old male who presented after generalized weakness and 2 episodes of watery diarrhea.  PMH is significant for ESRD on HD MWF, CAD, HTN, DM,hx of multiple falls, subdural hematoma, nonsustained VT tach, At s/p pacemaker  ? ACS Patient complains of midsternal chest pain.  Considering either GERD or ACS.  Ordered stat troponins, EKG and chest x-ray.  Patient being a high risk for ACS, do not want to take any chances and will do complete work-up and have also consulted cardiology.  Cardiology will also weigh in on his amiodarone use, elevated AST, ALT --Waiting for cardiology consultation --f/u Troponin's --f/u EKG --f/u Chest x-ray -- Pepcid 10 mg -- Vitals per floor  Generalized weakness, likely due to HTN poor p.o. intake Patient feels better and was able to move around in hallway on his own.  Likely related to HD and his poor oral intake also is a chronic problem.  Cortisol in a.m. 10.2 OT worked with the patient and did does not suggest any follow-up --Appreciate OT assistance --PT evl and treat --CBC a.m. -- CMP a.m.  ESRD on HD MWF Nephrology is following.  Patient usual schedule is MWF, received dialysis in hospital yesterday.  3.9, creatinine 9.21, BUN 43 this morning. -- Nephrology following, appreciate their assistance --CMP in a.m. -- Dialysis as per nephrology -- Avoid any nephrotoxic agents  RUQ pain-resolved -- Monitor closely  Elevated AST, ALT Awaiting this morning, but were improving until yesterday 81, 99.  Possibility can be amiodarone setting of  decreased p.o. intake.  Hepatitis panel negative.  Cardiology consulted about amiodarone use and elevated AST, ALT. -- Avoid hepatotoxic agents -- Waiting for cardiology recommendations  CAD I Hx of V-tach -- Cardiac telemetry -- Amiodarone on dialysis day -- Holding atorvastatin due to elevated AST, ALT --c/w ASA  Hx of AT s/p Pacemaker -- Pacemaker interrogated last on 02/26/2021, non-concerning  Hypoglycemia-resolved T2DM Patient had 1 episode of hypoglycemia yesterday due to being n.p.o. blood glucose 108 this morning.HbA1c 6.5%, patient not on any home medication for diabetes mellitus --Monitor closely     FEN/GI: Renal/carb modified diet PPx: SubQ Heparin   Status is: Observation  The patient will require care spanning > 2 midnights and should be moved to inpatient because: Ongoing diagnostic testing needed not appropriate for outpatient work up  Dispo:  Patient From: Home  Planned Disposition: To be determined  Medically stable for discharge: No       Subjective: No acute overnight events. Patient evaluated at bedside this morning.  Able to move around in hallway without any help and was warned not to repeat it again without presence of nurse.  Complains of substernal/epigastric pain and was suggested to restart Pepcid and shows good understanding about it.  Denies any other complaints  Objective: Temp:  [98.5 F (36.9 C)] 98.5 F (36.9 C) (06/01 0412) Pulse Rate:  [63-97] 77 (06/01 0412) Resp:  [15-18] 18 (06/01 0412) BP: (109-161)/(58-91) 109/65 (06/01 0412) SpO2:  [98 %-100 %] 100 % (06/01 0412) Weight:  [134 lb 7.7 oz (61 kg)-134 lb 14.7 oz (61.2 kg)] 134 lb 14.7 oz (61.2 kg) (06/01  0500) Physical Exam: General: Pleasant elderly male lying comfortably in bed, NAD Cardiovascular: RRR, no M/R/G Respiratory: Clear to auscultation bilaterally, breathing normally on room air Abdomen: Soft, nondistended, nontender, bowel sounds present Extremities: No edema  appreciated Neuro: AAAX3 Psych: Good mood and affect  Laboratory: Recent Labs  Lab 03/10/21 1029 03/11/21 0513 03/12/21 0326  WBC 11.1* 8.9 8.0  HGB 11.6* 10.4* 10.2*  HCT 36.2* 31.1* 30.7*  PLT 228 214 214   Recent Labs  Lab 03/10/21 1029 03/11/21 0333 03/12/21 0326  NA 133* 131* 132*  K 5.8* 4.7 3.9  CL 93* 91* 94*  CO2 23 20* 25  BUN 61* 75* 43*  CREATININE 12.81* 13.09* 9.21*  CALCIUM 9.1 8.7* 9.0  PROT 7.5 6.6  --   BILITOT 1.3* 1.1  --   ALKPHOS 152* 135*  --   ALT 101* 99*  --   AST 108* 81*  --   GLUCOSE 160* 83 108*    Imaging/Diagnostic Tests: DG Chest 2 View  Result Date: 03/12/2021 CLINICAL DATA:  Shortness of breath. Acute coronary syndrome. Chest pain. EXAM: CHEST - 2 VIEW COMPARISON:  03/10/2021 FINDINGS: The heart is not enlarged. Aortic atherosclerotic calcification and tortuosity as seen previously. Venous catheter comes up from the IVC with tip in the right atrium. Dual lead pacemaker appears unchanged. Right axillary region vascular stent. The lungs are clear. The pulmonary vascularity is normal. No effusions. No acute bone findings. IMPRESSION: No acute finding.  Multiple chronic vascular findings as above. Electronically Signed   By: Nelson Chimes M.D.   On: 03/12/2021 10:37   NM Hepatobiliary Liver Func  Result Date: 03/11/2021 CLINICAL DATA:  Recurrent biliary colic. EXAM: NUCLEAR MEDICINE HEPATOBILIARY IMAGING TECHNIQUE: Sequential images of the abdomen were obtained out to 60 minutes following intravenous administration of radiopharmaceutical. RADIOPHARMACEUTICALS:  5.5 mCi Tc-51m Choletec IV COMPARISON:  Mar 10, 2021. FINDINGS: Prompt uptake and biliary excretion of activity by the liver is seen. Gallbladder activity is visualized, consistent with patency of cystic duct. Biliary activity passes into small bowel, consistent with patent common bile duct. IMPRESSION: Normal uptake is noted within the gallbladder consistent with patent cystic duct.  Electronically Signed   By: JMarijo ConceptionM.D.   On: 03/11/2021 15:13    Donzell Coller, AMeredith Staggers MD 03/12/2021, 11:14 AM PGY-1, CWilberforceIntern pager: [phone] text pages welcome

## 2021-03-12 NOTE — Discharge Summary (Signed)
[facility] Discharge Summary  Patient name: William [name] Medical record number: [id number] Date of birth: Feb 17, 1941 Age: 80 y.o. Gender: male Date of Admission: 03/10/2021  Date of Discharge: 03/13/2021 Admitting Physician: Honor Junes, MD  Primary Care Provider: Matilde Haymaker, MD Consultants: Nephrology  Indication for Hospitalization: Generalized weakness  Discharge Diagnoses/Problem List:  1.  Generalized weakness, likely due to HD and poor intake 2.  ESRD HD MWF 3.  RUQ pain 4.  Elevated AST, ALT 5. Chest pain    Disposition: Home  Discharge Condition: Stable  Discharge Exam:  General: Extremely pleasant male lying comfortably in bed, NAD Cardiovascular: Regular rate and rhythm Respiratory: Clear to auscultation bilaterally Abdomen: Soft, nondistended, nontender Neurological: Alert, awake, oriented to time place and person Psych: Good mood and affect   Brief Hospital Course:  William [name] is a 80 y.o. male presenting with generalized weakness x 4 days and 2 episodes of watery diarrhea x 1 day . PMH is significant for ESRD on HD MWF, CAD, HTN, DM,hx multiple falls, subdural hematoma, nonsustained v-tach s/p pacemaker   Generalized weakness, likely to HD and poor PO inake Patient presents with c/o generalized weakness starting last Friday after dialysis.  Notes, being a chronic problem but unable to care for himself this morning after having two watery stools and presented to ED.  Denies running fever, vomiting, bloody diarrhea, sick contacts at home.  Denies syncope, recent fall, seizure-like activity.  Patient temperature 98.2, BP 102/67, HR80, RR 15, eating normally on room air.  EKG in normal sinus rhythm with QTC 522.  COVID-negative.  WBC 11.1, Hb 11.6, K+ 5.8, glucose 160, BUN 61, CR 12.81, AST 108, ALT 101.  CT chest does not show any acute cardiopulmonary disease. CT Head does not show any acute intracranial abnormality.  On chart  review, it looks like this is patient's chronic problem from recent admissions and outpatient visits.  He is also seen palliative care recently along with his daughters for goals of care (only initial visit).   As per nephro, they suggest to do blood culture to rule out sepsis as patient with tunneled cath in leg and sometimes that can present with GI symptoms. Suspected.weakness either due to HD session vs poor p.o. intake vs sepsis due to either acute cholecystitis or tunnel cath in leg.  Rule out sepsis, blood culture only grew staph epidermidis, considering that as a contaminant   ESRD on HD MWF.   Patient missed his dialysis. K+ 5.8, creatinine 12.81, BUN 61. Dry weight 59.5 Kg.  Nephrology consulted and due to under staffing they would put patient on schedule for 5/31  Patient mentions his desire to switch to peritoneal dialysis but amount of work he has to put into it and pushed back he is getting.  On chart review, his vascular surgeon recommended him to do peritoneal dialysis due to his ongoing passing out, weakness, low blood pressures, falls after hemodialysis.    RUQ pain  Patient presents with 2 episodes of watery diarrhea this morning along with some nausea.  Also complaining of mid gastric abdominal pain which is resolved now.  On palpation patient has right upper quadrant pain, + Murphy's sign. CT scan represents periportal edema can be due to liver congestion or hepatitis or acute cholecystitis.  Patient has right upper quadrant pain, CT suggestive of acute cholecystitis, mild leukocytosis 11.1, transaminitis AST 108, ALT 101, total bilirubin 1.3 but normal lipase of 42.  Right upper ultrasound abdomen  showed a diffusely thickened gallbladder, related to possible underlying liver or systemic pathology and acute acalculous cholecystitis seems less likely.  --IV Cefepime (5/30-) and IV Flagyll (5/30-). HIDA scan negative and IV antibiotics were discontinued.    Transaminitis AST 108, ALT  101.  This is new for this patient.  It might be because of either amiodarone, due to decreased p.o. intake versus hepatitis versus fatty liver.  Patient denies any alcohol use history.  Hepatitis panel negative. Cardiology consulted and they changed amiodarone to Metoprolol 25 mg BID to avoid hepatotoxicity.   Chest pain Chest pain, nonspecific but non anginal. Baseline EKG with paced rhythm. Initial troponin very similar to prior and minimally elevated and flat later.   All other chronic conditions stable during admission       Issues for Follow Up:  1. CBC, BMP in 1 week 2. PCP follow up in 1 week 3. AST, ALT in 1 week  Significant Procedures:None  Significant Labs and Imaging:  Recent Labs  Lab 03/10/21 1029 03/11/21 0513 03/12/21 0326  WBC 11.1* 8.9 8.0  HGB 11.6* 10.4* 10.2*  HCT 36.2* 31.1* 30.7*  PLT 228 214 214   Recent Labs  Lab 03/10/21 1029 03/11/21 0333 03/12/21 0326 03/12/21 1058  NA 133* 131* 132*  --   K 5.8* 4.7 3.9  --   CL 93* 91* 94*  --   CO2 23 20* 25  --   GLUCOSE 160* 83 108*  --   BUN 61* 75* 43*  --   CREATININE 12.81* 13.09* 9.21*  --   CALCIUM 9.1 8.7* 9.0  --   MG 2.3  --   --   --   PHOS  --  8.6*  --   --   ALKPHOS 152* 135*  --  117  AST 108* 81*  --  72*  ALT 101* 99*  --  132*  ALBUMIN 3.4* 3.0*  --  3.1*   CT ABDOMEN PELVIS WO CONTRAST  Result Date: 03/10/2021 CLINICAL DATA:  Right lower quadrant abdominal pain. EXAM: CT ABDOMEN AND PELVIS WITHOUT CONTRAST TECHNIQUE: Multidetector CT imaging of the abdomen and pelvis was performed following the standard protocol without IV contrast. COMPARISON:  November 09, 2018 FINDINGS: Lower chest: Partially visualized cardiac pacemaker leads. Right femoral vein catheter terminates in the inferior vena cava. Hepatobiliary: Minimal perihepatic ascites. Normal appearance of the liver. Indistinct appearance of the gallbladder. Fat stranding at porta hepatis. Pancreas: Unremarkable. No pancreatic  ductal dilatation or surrounding inflammatory changes. Spleen: Mild perisplenic ascites. Adrenals/Urinary Tract: Adrenal glands are unremarkable. Kidneys are normal, without renal calculi, focal lesion, or hydronephrosis. Increased attenuation of the urinary bladder contents. Stomach/Bowel: Normal appearance of the stomach. Two duodenal diverticula measuring 3.1 and 3.9 cm. No evidence of appendicitis. Scattered diverticulosis without evidence of diverticulitis. Vascular/Lymphatic: Aortic atherosclerosis. No enlarged abdominal or pelvic lymph nodes. Reproductive: Small surgically absent prostate gland. Other: No abdominal wall hernia or abnormality. No abdominopelvic ascites. Musculoskeletal: Mild spondylosis of the lumbosacral spine. IMPRESSION: 1. No evidence of appendicitis. 2. Indistinct appearance of the gallbladder. Fat stranding at the porta hepatis. These findings may represent periportal edema, which may be seen with congestive heart failure, liver congestion or hepatitis. Alternatively, acute cholecystitis may be considered. Please correlate clinically. 3. Small perihepatic and perisplenic ascites. 4. Increased attenuation of the urinary bladder contents. Please correlate to urinalysis and/or sonographic evaluation with full bladder. 5. Scattered diverticulosis without evidence of diverticulitis. 6. Aortic atherosclerosis. Aortic Atherosclerosis (ICD10-I70.0). Electronically Signed  By: Fidela Salisbury M.D.   On: 03/10/2021 15:36   DG Chest 2 View  Result Date: 03/12/2021 CLINICAL DATA:  Shortness of breath. Acute coronary syndrome. Chest pain. EXAM: CHEST - 2 VIEW COMPARISON:  03/10/2021 FINDINGS: The heart is not enlarged. Aortic atherosclerotic calcification and tortuosity as seen previously. Venous catheter comes up from the IVC with tip in the right atrium. Dual lead pacemaker appears unchanged. Right axillary region vascular stent. The lungs are clear. The pulmonary vascularity is normal. No  effusions. No acute bone findings. IMPRESSION: No acute finding.  Multiple chronic vascular findings as above. Electronically Signed   By: Nelson Chimes M.D.   On: 03/12/2021 10:37   DG Chest 2 View  Result Date: 03/10/2021 CLINICAL DATA:  80 year old male with history of weakness. Nausea, vomiting and diarrhea. EXAM: CHEST - 2 VIEW COMPARISON:  Chest x-ray 10/21/2020. FINDINGS: Lung volumes are normal. No consolidative airspace disease. No pleural effusions. No pneumothorax. No pulmonary nodule or mass noted. Pulmonary vasculature and the cardiomediastinal silhouette are within normal limits. Atherosclerosis in the thoracic aorta. Left-sided pacemaker in place with lead tips projecting over the expected location of the right atrium and right ventricle. IMPRESSION: 1. No radiographic evidence of acute cardiopulmonary disease. 2. Aortic atherosclerosis. Electronically Signed   By: Vinnie Langton M.D.   On: 03/10/2021 10:50   CT Head Wo Contrast  Result Date: 03/10/2021 CLINICAL DATA:  80 year old male with history of mental status change. Weakness. EXAM: CT HEAD WITHOUT CONTRAST TECHNIQUE: Contiguous axial images were obtained from the base of the skull through the vertex without intravenous contrast. COMPARISON:  Head CT 10/21/2020. FINDINGS: Brain: Moderate cerebral and mild cerebellar atrophy. Patchy and confluent areas of decreased attenuation are noted throughout the deep and periventricular white matter of the cerebral hemispheres bilaterally, compatible with chronic microvascular ischemic disease. Well-defined areas of low attenuation in the left basal ganglia, right pons and left cerebellar hemisphere, compatible with old infarcts. No evidence of acute infarction, hemorrhage, hydrocephalus, extra-axial collection or mass lesion/mass effect. Vascular: No hyperdense vessel or unexpected calcification. Skull: Normal. Negative for fracture or focal lesion. Sinuses/Orbits: No acute finding. Other: None.  IMPRESSION: 1. No acute intracranial abnormalities. 2. Moderate cerebral and mild cerebellar atrophy with extensive chronic microvascular ischemic changes and old lacunar infarcts, as above. Electronically Signed   By: Vinnie Langton M.D.   On: 03/10/2021 11:20   NM Hepatobiliary Liver Func  Result Date: 03/11/2021 CLINICAL DATA:  Recurrent biliary colic. EXAM: NUCLEAR MEDICINE HEPATOBILIARY IMAGING TECHNIQUE: Sequential images of the abdomen were obtained out to 60 minutes following intravenous administration of radiopharmaceutical. RADIOPHARMACEUTICALS:  5.5 mCi Tc-40m Choletec IV COMPARISON:  Mar 10, 2021. FINDINGS: Prompt uptake and biliary excretion of activity by the liver is seen. Gallbladder activity is visualized, consistent with patency of cystic duct. Biliary activity passes into small bowel, consistent with patent common bile duct. IMPRESSION: Normal uptake is noted within the gallbladder consistent with patent cystic duct. Electronically Signed   By: JMarijo ConceptionM.D.   On: 03/11/2021 15:13   [place]Abdomen Limited RUQ (LIVER/GB)  Result Date: 03/10/2021 CLINICAL DATA:  80year old male with abdominal pain. EXAM: ULTRASOUND ABDOMEN LIMITED RIGHT UPPER QUADRANT COMPARISON:  CT of the abdomen pelvis dated 03/10/2021. FINDINGS: Gallbladder: No gallstone. There is diffuse thickened appearance of the gallbladder wall measuring 6 mm. Negative sonographic Murphy's sign. No pericholecystic fluid. Small perihepatic ascites noted on the earlier CT. Common bile duct: Diameter: 3 mm Liver: No focal lesion identified.  Within normal limits in parenchymal echogenicity. Portal vein is patent on color Doppler imaging with normal direction of blood flow towards the liver. Other: None. IMPRESSION: Diffusely thickened gallbladder wall may be related to underlying liver or systemic pathology. Acute acalculous cholecystitis is less likely. A hepatobiliary scintigraphy may provide better evaluation of the  gallbladder if there is a high clinical concern for acute cholecystitis . Electronically Signed   By: Anner Crete M.D.   On: 03/10/2021 16:44    Results/Tests Pending at Time of Discharge: None  Discharge Medications:  Allergies as of 03/13/2021      Reactions   Carvedilol Other (See Comments)   Makes him feel like his pelvis is "grinding" (??) Patient doesn't recall this, however      Medication List    STOP taking these medications   amiodarone 200 MG tablet Commonly known as: PACERONE     TAKE these medications   aspirin 81 MG chewable tablet Chew 1 tablet (81 mg total) by mouth daily.   atorvastatin 40 MG tablet Commonly known as: LIPITOR Take 1 tablet (40 mg total) by mouth daily.   calcitRIOL 0.25 MCG capsule Commonly known as: ROCALTROL Take 5 capsules (1.25 mcg total) by mouth every Monday, Wednesday, and Friday with hemodialysis. What changed:   when to take this  additional instructions   famotidine 10 MG tablet Commonly known as: PEPCID Take 1 tablet (10 mg total) by mouth daily.   metoprolol tartrate 25 MG tablet Commonly known as: LOPRESSOR Take 1 tablet (25 mg total) by mouth 2 (two) times daily.   vitamin C 1000 MG tablet Take 1 tablet by mouth daily.       Discharge Instructions: Please refer to Patient Instructions section of EMR for full details.  Patient was counseled important signs and symptoms that should prompt return to medical care, changes in medications, dietary instructions, activity restrictions, and follow up appointments.   Follow-Up Appointments:  Follow-up Information    Matilde Haymaker, MD. Schedule an appointment as soon as possible for a visit in 1 week(s).   Specialty: Family Medicine Contact information: [place] [address] [phone]        Nahser, Wonda Cheng, MD .   Specialty: Cardiology Contact information: [address] [place] [address] [phone]                Honor Junes, MD 03/13/2021, 1:37 PM PGY-1, [place]

## 2021-03-12 NOTE — Evaluation (Signed)
Physical Therapy Evaluation Patient Details Name: William [name] MRN: [id number] DOB: [birth year] Today's Date: 03/12/2021   History of Present Illness  Pt is a 80 year old man admitted iwth N/V/D, generalized weakness and difficulty taking care of himself. He lives with his daughter and her children and walks with a cane at baseline. PMH: ESRD, PAD, PPM, anemia, sarcoidosis, DM2, CHF, HTN, CAD, SDU with craniotomy.  Clinical Impression  PTA pt living with daughter in single story home with 4 steps to enter. Pt reports independence in mobility with SPC, and independence in bADLs, assists his daughter with cooking and cleaning. Pt is currently limited in safe mobility by generalized weakness with associated decreased balance and endurance. Pt is currently supervision for transfers, and min guard for ambulation and ascent/descent of step with SPC. Pt will not have any additional PT needs at discharge however PT will continue to follow acutely to improve strength and endurance.     Follow Up Recommendations No PT follow up;Supervision - Intermittent    Equipment Recommendations  None recommended by PT       Precautions / Restrictions Precautions Precautions: Fall Precaution Comments: has fallen 2 x in 6 months, permanent HD cath in L LE      Mobility  Bed Mobility Overal bed mobility:              General bed mobility comments: OOB in recliner     Transfers Overall transfer level: Needs assistance Equipment used: None;Straight cane Transfers: Sit to/from Stand Sit to Stand: Supervision         General transfer comment: supervision for safety  Ambulation/Gait Ambulation/Gait assistance: Min guard Gait Distance (Feet): 20 Feet Assistive device: Straight cane Gait Pattern/deviations: Step-through pattern;Decreased step length - right;Decreased step length - left;Trunk flexed Gait velocity: slowed Gait velocity interpretation: <1.8 ft/sec, indicate of risk for recurrent  falls General Gait Details: min guard for safety  Stairs Stairs: Yes Stairs assistance: Min guard Stair Management: Backwards;Forwards;With cane Number of Stairs: 4 General stair comments: able to step up one step x4 with min guard and use of cane, strong, steady ascent/descent      Balance Overall balance assessment: Needs assistance   Sitting balance-Leahy Scale: Good Sitting balance - Comments: no LOB donning socks     Standing balance-Leahy Scale: Fair                               Pertinent Vitals/Pain Pain Assessment: No/denies pain    Home Living Family/patient expects to be discharged to:: Private residence Living Arrangements: Children;Other relatives (3 grandchildren) Available Help at Discharge: Family;Available 24 hours/day Type of Home: House Home Access: Stairs to enter Entrance Stairs-Rails: Left Entrance Stairs-Number of Steps: 3 Home Layout: Multi-level;Able to live on main level with bedroom/bathroom Home Equipment: Kasandra Knudsen - single point;Walker - 2 wheels;Wheelchair - manual      Prior Function Level of Independence: Independent with assistive device(s)         Comments: uses a cane, sponge bathes, helps daughter with with meals and housekeeping     Hand Dominance   Dominant Hand: Right    Extremity/Trunk Assessment   Upper Extremity Assessment Upper Extremity Assessment: Defer to OT evaluation    Lower Extremity Assessment Lower Extremity Assessment: Generalized weakness    Cervical / Trunk Assessment Cervical / Trunk Assessment: Kyphotic  Communication   Communication: No difficulties  Cognition Arousal/Alertness: Awake/alert Behavior During Therapy: WFL for tasks  assessed/performed Overall Cognitive Status: Within Functional Limits for tasks assessed                                        General Comments General comments (skin integrity, edema, etc.): VSS on RA,        Assessment/Plan    PT  Assessment Patient needs continued PT services  PT Problem List Decreased balance;Decreased strength       PT Treatment Interventions DME instruction;Gait training;Stair training;Functional mobility training;Therapeutic activities;Therapeutic exercise;Balance training;Cognitive remediation;Patient/family education    PT Goals (Current goals can be found in the Care Plan section)  Acute Rehab PT Goals Patient Stated Goal: return home PT Goal Formulation: With patient Time For Goal Achievement: 03/26/21 Potential to Achieve Goals: Good    Frequency Min 3X/week    AM-PAC PT "6 Clicks" Mobility  Outcome Measure Help needed turning from your back to your side while in a flat bed without using bedrails?: None Help needed moving from lying on your back to sitting on the side of a flat bed without using bedrails?: None Help needed moving to and from a bed to a chair (including a wheelchair)?: None Help needed standing up from a chair using your arms (e.g., wheelchair or bedside chair)?: None Help needed to walk in hospital room?: A Little Help needed climbing 3-5 steps with a railing? : A Little 6 Click Score: 22    End of Session   Activity Tolerance: Patient tolerated treatment well Patient left: in chair;with call bell/phone within reach;with chair alarm set Nurse Communication: Mobility status PT Visit Diagnosis: Muscle weakness (generalized) (M62.81)    Time: [id number] PT Time Calculation (min) (ACUTE ONLY): 16 min   Charges:   PT Evaluation $PT Eval Moderate Complexity: 1 Mod          William [name] B. Migdalia Dk PT, DPT Acute Rehabilitation Services Pager [phone] Office [phone]   William [name] 03/12/2021, 12:28 PM
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
## 2021-03-12 NOTE — Progress Notes (Signed)
[facility] Progress Note   Subjective:   Pt seen in room, reports feeling much better. He denies any shortness of breath, chest pain, palpitations, abdominal pain, nausea and vomiting. Per RN, his daughter asked how long he can have a  Thigh TDC. Per previous vascular notes and patient himself, he is not interested in pursuing another permanent access. TDC is working well so no indication to replace it at this time.   Pt reports he is interested in PD. I talked to his primary nephrologist, Dr. Joelyn Oms, who reports this is a possibility but patient will need to set up a meeting with home therapies and a family member who is able to assist him with PD.   Objective Vitals:   03/11/21 1706 03/11/21 2148 03/12/21 0412 03/12/21 0500  BP: 133/76 (!) 111/58 109/65   Pulse: 97 97 77   Resp: '15 18 18   '$ Temp: 98.5 F (36.9 C) 98.5 F (36.9 C) 98.5 F (36.9 C)   TempSrc: Oral Oral Oral   SpO2: 99% 98% 100%   Weight:    61.2 kg  Height:       Physical Exam General: WDWN male, alert and in NAD Heart: RRR, no murmurs, rubs or gallops Lungs: CTA bilaterally without wheezing, rhonchi or rales Abdomen: Soft, non-distended, +BS. No ascites noted Extremities: No edema b/l lower extremities Dialysis Access: Thigh [facility]  Additional Objective Labs: Basic Metabolic Panel: Recent Labs  Lab 03/10/21 1029 03/11/21 0333 03/12/21 0326  NA 133* 131* 132*  K 5.8* 4.7 3.9  CL 93* 91* 94*  CO2 23 20* 25  GLUCOSE 160* 83 108*  BUN 61* 75* 43*  CREATININE 12.81* 13.09* 9.21*  CALCIUM 9.1 8.7* 9.0  PHOS  --  8.6*  --    Liver Function Tests: Recent Labs  Lab 03/10/21 1029 03/11/21 0333  AST 108* 81*  ALT 101* 99*  ALKPHOS 152* 135*  BILITOT 1.3* 1.1  PROT 7.5 6.6  ALBUMIN 3.4* 3.0*   Recent Labs  Lab 03/10/21 1420  LIPASE 42   CBC: Recent Labs  Lab 03/10/21 1029 03/11/21 0513 03/12/21 0326  WBC 11.1* 8.9 8.0  NEUTROABS 9.1*  --   --   HGB 11.6* 10.4* 10.2*  HCT  36.2* 31.1* 30.7*  MCV 100.0 96.6 97.2  PLT 228 214 214   Blood Culture    Component Value Date/Time   SDES BLOOD LEFT HAND 03/10/2021 1550   SPECREQUEST  03/10/2021 1550    BOTTLES DRAWN AEROBIC AND ANAEROBIC Blood Culture results may not be optimal due to an inadequate volume of blood received in culture bottles   CULT (A) 03/10/2021 1550    STAPHYLOCOCCUS EPIDERMIDIS THE SIGNIFICANCE OF ISOLATING THIS ORGANISM FROM A SINGLE SET OF BLOOD CULTURES WHEN MULTIPLE SETS ARE DRAWN IS UNCERTAIN. PLEASE NOTIFY THE MICROBIOLOGY DEPARTMENT WITHIN ONE WEEK IF SPECIATION AND SENSITIVITIES ARE REQUIRED. Performed at [facility], [place] [address], [place], [place] [address]    REPTSTATUS PENDING 03/10/2021 1550   CBG: Recent Labs  Lab 03/10/21 1617 03/10/21 2053 03/11/21 1527 03/11/21 1559 03/11/21 1854  GLUCAP 89 119* 47* 100* 230*   Studies/Results: CT ABDOMEN PELVIS WO CONTRAST  Result Date: 03/10/2021 CLINICAL DATA:  Right lower quadrant abdominal pain. EXAM: CT ABDOMEN AND PELVIS WITHOUT CONTRAST TECHNIQUE: Multidetector CT imaging of the abdomen and pelvis was performed following the standard protocol without IV contrast. COMPARISON:  November 09, 2018 FINDINGS: Lower chest: Partially visualized cardiac pacemaker leads. Right femoral vein  catheter terminates in the inferior vena cava. Hepatobiliary: Minimal perihepatic ascites. Normal appearance of the liver. Indistinct appearance of the gallbladder. Fat stranding at porta hepatis. Pancreas: Unremarkable. No pancreatic ductal dilatation or surrounding inflammatory changes. Spleen: Mild perisplenic ascites. Adrenals/Urinary Tract: Adrenal glands are unremarkable. Kidneys are normal, without renal calculi, focal lesion, or hydronephrosis. Increased attenuation of the urinary bladder contents. Stomach/Bowel: Normal appearance of the stomach. Two duodenal diverticula measuring 3.1 and 3.9 cm. No evidence of appendicitis. Scattered  diverticulosis without evidence of diverticulitis. Vascular/Lymphatic: Aortic atherosclerosis. No enlarged abdominal or pelvic lymph nodes. Reproductive: Small surgically absent prostate gland. Other: No abdominal wall hernia or abnormality. No abdominopelvic ascites. Musculoskeletal: Mild spondylosis of the lumbosacral spine. IMPRESSION: 1. No evidence of appendicitis. 2. Indistinct appearance of the gallbladder. Fat stranding at the porta hepatis. These findings may represent periportal edema, which may be seen with congestive heart failure, liver congestion or hepatitis. Alternatively, acute cholecystitis may be considered. Please correlate clinically. 3. Small perihepatic and perisplenic ascites. 4. Increased attenuation of the urinary bladder contents. Please correlate to urinalysis and/or sonographic evaluation with full bladder. 5. Scattered diverticulosis without evidence of diverticulitis. 6. Aortic atherosclerosis. Aortic Atherosclerosis (ICD10-I70.0). Electronically Signed   By: Fidela Salisbury M.D.   On: 03/10/2021 15:36   DG Chest 2 View  Result Date: 03/12/2021 CLINICAL DATA:  Shortness of breath. Acute coronary syndrome. Chest pain. EXAM: CHEST - 2 VIEW COMPARISON:  03/10/2021 FINDINGS: The heart is not enlarged. Aortic atherosclerotic calcification and tortuosity as seen previously. Venous catheter comes up from the IVC with tip in the right atrium. Dual lead pacemaker appears unchanged. Right axillary region vascular stent. The lungs are clear. The pulmonary vascularity is normal. No effusions. No acute bone findings. IMPRESSION: No acute finding.  Multiple chronic vascular findings as above. Electronically Signed   By: Nelson Chimes M.D.   On: 03/12/2021 10:37   NM Hepatobiliary Liver Func  Result Date: 03/11/2021 CLINICAL DATA:  Recurrent biliary colic. EXAM: NUCLEAR MEDICINE HEPATOBILIARY IMAGING TECHNIQUE: Sequential images of the abdomen were obtained out to 60 minutes following  intravenous administration of radiopharmaceutical. RADIOPHARMACEUTICALS:  5.5 mCi Tc-79m Choletec IV COMPARISON:  Mar 10, 2021. FINDINGS: Prompt uptake and biliary excretion of activity by the liver is seen. Gallbladder activity is visualized, consistent with patency of cystic duct. Biliary activity passes into small bowel, consistent with patent common bile duct. IMPRESSION: Normal uptake is noted within the gallbladder consistent with patent cystic duct. Electronically Signed   By: JMarijo ConceptionM.D.   On: 03/11/2021 15:13   [place]domen Limited RUQ (LIVER/GB)  Result Date: 03/10/2021 CLINICAL DATA:  80year old male with abdominal pain. EXAM: ULTRASOUND ABDOMEN LIMITED RIGHT UPPER QUADRANT COMPARISON:  CT of the abdomen pelvis dated 03/10/2021. FINDINGS: Gallbladder: No gallstone. There is diffuse thickened appearance of the gallbladder wall measuring 6 mm. Negative sonographic Murphy's sign. No pericholecystic fluid. Small perihepatic ascites noted on the earlier CT. Common bile duct: Diameter: 3 mm Liver: No focal lesion identified. Within normal limits in parenchymal echogenicity. Portal vein is patent on color Doppler imaging with normal direction of blood flow towards the liver. Other: None. IMPRESSION: Diffusely thickened gallbladder wall may be related to underlying liver or systemic pathology. Acute acalculous cholecystitis is less likely. A hepatobiliary scintigraphy may provide better evaluation of the gallbladder if there is a high clinical concern for acute cholecystitis . Electronically Signed   By: AAnner CreteM.D.   On: 03/10/2021 16:44   Medications:  .  amiodarone  200 mg Oral Q M,W,F  . aspirin  81 mg Oral Daily  . calcitRIOL  1.25 mcg Oral Q M,W,F-HD  . Chlorhexidine Gluconate Cloth  6 each Topical Q0600  . Chlorhexidine Gluconate Cloth  6 each Topical Q0600  . famotidine  10 mg Oral Daily  . heparin  5,000 Units Subcutaneous Q8H  . lanthanum  500 mg Oral TID WC     Outpatient Dialysis Orders:  MWF [facility] 4h 400/1.5 59.5kg 2/2 bath P4 TDC Hep 4000  - venofer 50 weekly, no esa - calcitriol 1.25 ug tiw po - home: lanthanum 1 tab tid ac, no BP lowering meds  Assessment/Plan: 1. Generalized weakness - w/ some GI symptoms, diarrhea x 1 day on admission. CT with gallblader dz vs. Fluid overload, [place] suggestive of underlying liver or systemic pathology but not acute acalculous cholecystitis, small perihepatic ascites. No fevers. HIDA scan negative and IV Abx stopped. Pt feeling much better. BCx 1/2 growing staph epi, most likely contamination, other cultures negative.  2. ESRD - HD MWF but treatment rolled over to Tuesday due to high patient census. Over EDW by bed weights but suspect some weight gain. 1. Pt expresses interest in PD.  I talked to his primary nephrologist, Dr. Joelyn Oms, who reports this is a possibility but patient will need to first set up a meeting with home therapies Erlene Quan) and a family member who is able to assist him with PD. Dialysis RN educator, Kami, to speak with the patient about this today.  3. BP/ volume - BP's stable, no vol excess on exam, small ascites on [place]. CXR neg. Set for 1L UF today. Pt last admit was gen weakness and 2 priors were for syncope so will be cautious with fluid removal. Hx NSVT. 4. Diarrhea - acute, per primary team 5. ^LFT's - per pmd 6. Hx CAD/ NSVT/ sp PPM  Anice Paganini, PA-C 03/12/2021, 11:21 AM  [facility] Pager: [phone]

## 2021-03-12 NOTE — Plan of Care (Signed)
  Problem: Clinical Measurements: Goal: Will remain free from infection Outcome: Progressing   Problem: Clinical Measurements: Goal: Respiratory complications will improve Outcome: Progressing   Problem: Clinical Measurements: Goal: Cardiovascular complication will be avoided Outcome: Progressing   Problem: Pain Managment: Goal: General experience of comfort will improve Outcome: Progressing   Problem: Safety: Goal: Ability to remain free from injury will improve Outcome: Progressing   Problem: Skin Integrity: Goal: Risk for impaired skin integrity will decrease Outcome: Progressing

## 2021-03-13 ENCOUNTER — Telehealth: Payer: Self-pay

## 2021-03-13 ENCOUNTER — Other Ambulatory Visit (HOSPITAL_COMMUNITY): Payer: Self-pay

## 2021-03-13 DIAGNOSIS — R531 Weakness: Secondary | ICD-10-CM | POA: Diagnosis not present

## 2021-03-13 LAB — CULTURE, BLOOD (ROUTINE X 2)

## 2021-03-13 MED ORDER — CHLORHEXIDINE GLUCONATE CLOTH 2 % EX PADS: 6.0000 | MEDICATED_PAD | Freq: Every day | CUTANEOUS | Status: DC

## 2021-03-13 MED ORDER — METOPROLOL TARTRATE 25 MG PO TABS
25.0000 mg | ORAL_TABLET | Freq: Two times a day (BID) | ORAL | 0 refills | Status: DC
Start: 2021-03-13 — End: 2021-04-03
  Filled 2021-03-13: qty 60, 30d supply, fill #0

## 2021-03-13 MED ORDER — HEPARIN SODIUM (PORCINE) 1000 UNIT/ML IJ SOLN
INTRAMUSCULAR | Status: AC
  Filled 2021-03-13: qty 6

## 2021-03-13 NOTE — Progress Notes (Signed)
At patient discharge please contact patient's daughter, [name], at discharge to give enough notice for child care.  Dorthey Sawyer, RN

## 2021-03-13 NOTE — Discharge Instructions (Signed)
Dear Mr. Gourdin,  You are much improvement in your weakness this morning.  -- Please do not take amiodarone at home again, but take the change medication metoprolol 2 times daily -- Please continue taking lanthanum at home all other medications as prescribed.   It was pleasure taking care of you! Dr. [name]

## 2021-03-13 NOTE — Telephone Encounter (Signed)
Will forward to MD. William [name],CMA  

## 2021-03-13 NOTE — Progress Notes (Signed)
FPTS Interim Progress Note  Patient currently in Dialysis. Rounded with primary RN.  No concerns voiced.  No orders required.  Appreciated nightly round.  Today's Vitals   03/12/21 2330 03/13/21 0000 03/13/21 0030 03/13/21 0135  BP: 123/65 136/74 125/69 124/67  Pulse: 69 72 74 79  Resp: 16   16  Temp:    98.4 F (36.9 C)  TempSrc:      SpO2:    98%  Weight:      Height:      PainSc:        Carollee Leitz, MD [facility]

## 2021-03-13 NOTE — Progress Notes (Addendum)
[facility] Progress Note   Subjective:   Seen in room, feeling better overall. Tolerated HD well last night. Denies SOB, CP, palpitations, dizziness, abdominal pain and nausea.   Objective Vitals:   03/13/21 0045 03/13/21 0135 03/13/21 0506 03/13/21 0913  BP: 132/72 124/67 118/67 (!) 93/57  Pulse: 75 79 73 72  Resp: '17 16 17 18  '$ Temp: 98.4 F (36.9 C) 98.4 F (36.9 C) 98.5 F (36.9 C) 98.3 F (36.8 C)  TempSrc:      SpO2:  98% 99% 100%  Weight: 62 kg     Height:       Physical Exam General:WDWN male, alert and in NAD Heart:RRR, no murmurs, rubs or gallops Lungs:CTA bilaterally without wheezing, rhonchi or rales Abdomen:Soft, non-distended, +BS. No ascites noted Extremities:No edema b/l lower extremities Dialysis Access:Thigh [facility]  Additional Objective Labs: Basic Metabolic Panel: Recent Labs  Lab 03/10/21 1029 03/11/21 0333 03/12/21 0326  NA 133* 131* 132*  K 5.8* 4.7 3.9  CL 93* 91* 94*  CO2 23 20* 25  GLUCOSE 160* 83 108*  BUN 61* 75* 43*  CREATININE 12.81* 13.09* 9.21*  CALCIUM 9.1 8.7* 9.0  PHOS  --  8.6*  --    Liver Function Tests: Recent Labs  Lab 03/10/21 1029 03/11/21 0333 03/12/21 1058  AST 108* 81* 72*  ALT 101* 99* 132*  ALKPHOS 152* 135* 117  BILITOT 1.3* 1.1 0.7  PROT 7.5 6.6 7.6  ALBUMIN 3.4* 3.0* 3.1*   Recent Labs  Lab 03/10/21 1420  LIPASE 42   CBC: Recent Labs  Lab 03/10/21 1029 03/11/21 0513 03/12/21 0326  WBC 11.1* 8.9 8.0  NEUTROABS 9.1*  --   --   HGB 11.6* 10.4* 10.2*  HCT 36.2* 31.1* 30.7*  MCV 100.0 96.6 97.2  PLT 228 214 214   Blood Culture    Component Value Date/Time   SDES BLOOD LEFT HAND 03/10/2021 1550   SPECREQUEST  03/10/2021 1550    BOTTLES DRAWN AEROBIC AND ANAEROBIC Blood Culture results may not be optimal due to an inadequate volume of blood received in culture bottles   CULT (A) 03/10/2021 1550    STAPHYLOCOCCUS EPIDERMIDIS THE SIGNIFICANCE OF ISOLATING THIS ORGANISM  FROM A SINGLE SET OF BLOOD CULTURES WHEN MULTIPLE SETS ARE DRAWN IS UNCERTAIN. PLEASE NOTIFY THE MICROBIOLOGY DEPARTMENT WITHIN ONE WEEK IF SPECIATION AND SENSITIVITIES ARE REQUIRED. Performed at [facility], [place] [address] [place], [place] [address]    REPTSTATUS 03/13/2021 FINAL 03/10/2021 1550    Cardiac Enzymes: No results for input(s): CKTOTAL, CKMB, CKMBINDEX, TROPONINI in the last 168 hours. CBG: Recent Labs  Lab 03/10/21 1617 03/10/21 2053 03/11/21 1527 03/11/21 1559 03/11/21 1854  GLUCAP 89 119* 47* 100* 230*   Iron Studies: No results for input(s): IRON, TIBC, TRANSFERRIN, FERRITIN in the last 72 hours. '@lablastinr3'$ @ Studies/Results: DG Chest 2 View  Result Date: 03/12/2021 CLINICAL DATA:  Shortness of breath. Acute coronary syndrome. Chest pain. EXAM: CHEST - 2 VIEW COMPARISON:  03/10/2021 FINDINGS: The heart is not enlarged. Aortic atherosclerotic calcification and tortuosity as seen previously. Venous catheter comes up from the IVC with tip in the right atrium. Dual lead pacemaker appears unchanged. Right axillary region vascular stent. The lungs are clear. The pulmonary vascularity is normal. No effusions. No acute bone findings. IMPRESSION: No acute finding.  Multiple chronic vascular findings as above. Electronically Signed   By: Nelson Chimes M.D.   On: 03/12/2021 10:37   NM Hepatobiliary Liver Func  Result  Date: 03/11/2021 CLINICAL DATA:  Recurrent biliary colic. EXAM: NUCLEAR MEDICINE HEPATOBILIARY IMAGING TECHNIQUE: Sequential images of the abdomen were obtained out to 60 minutes following intravenous administration of radiopharmaceutical. RADIOPHARMACEUTICALS:  5.5 mCi Tc-48m Choletec IV COMPARISON:  Mar 10, 2021. FINDINGS: Prompt uptake and biliary excretion of activity by the liver is seen. Gallbladder activity is visualized, consistent with patency of cystic duct. Biliary activity passes into small bowel, consistent with patent common bile duct. IMPRESSION:  Normal uptake is noted within the gallbladder consistent with patent cystic duct. Electronically Signed   By: JMarijo ConceptionM.D.   On: 03/11/2021 15:13   Medications:  . aspirin  81 mg Oral Daily  . calcitRIOL  1.25 mcg Oral Q M,W,F-HD  . Chlorhexidine Gluconate Cloth  6 each Topical Q0600  . Chlorhexidine Gluconate Cloth  6 each Topical Q0600  . famotidine  10 mg Oral Daily  . heparin  5,000 Units Subcutaneous Q8H  . heparin sodium (porcine)      . lanthanum  500 mg Oral TID WC  . metoprolol tartrate  25 mg Oral BID     OutpatientDialysis Orders: MWF [facility] 4h 400/1.5 59.5kg 2/2 bath P4 TDC Hep 4000  - venofer 50 weekly, no esa - calcitriol 1.25 ug tiw po - home: lanthanum 1 tab tid ac, no BP lowering meds   Assessment/Plan: 1. Generalized weakness - w/ some GI symptoms, diarrhea x 1 day on admission.CT with gallblader dz vs. Fluid overload, [place] of underlying liver or systemic pathology but not acute acalculous cholecystitis, small perihepatic ascites.No fevers.HIDA scan negative and IV Abx stopped. Pt feeling much better. BCx 1/2 growing staph epi, most likely contamination, other cultures negative.  2. ESRD - HD MWF. Over EDW by bed weights but suspect some weight gain. Continue MWF schedule. Likely will need high dry wt on discharge, if wt can be confirmed on stand up scale. Around 61-62kg.  1. Pt expresses interest in PD.  I talked to his primary nephrologist, Dr. SJoelyn Oms who reports this is a possibility but patient will need to first set up a meeting with home therapies (Erlene Quan and a family member who is able to assist him with PD. Dialysis RN educator, KLouanne Belton provided this info to the patient on 03/12/21.  3. BP/ volume - BP's stable, no vol excess on exam, small ascites on [place] CXR neg.Pt last admit was gen weakness and 2 priors were for syncopeso will be cautious with fluid removal. Hx NSVT. 4. Diarrhea - acute, per primary team. Now resolved.   5. ^LFT's - per pmd 6. Hx CAD/ NSVT/ sp PPM  SAnice Paganini PA-C 03/13/2021, 9:28 AM  [facility] Pager: [phone] Pt seen, examined and agree w assess/plan as above with additions as indicated.  [facility] 03/13/2021, 4:55 PM

## 2021-03-13 NOTE — Progress Notes (Signed)
Occupational Therapy Treatment Patient Details Name: William [name] MRN: [id number] DOB: 1941/02/06 Today's Date: 03/13/2021    History of present illness Pt is a 80 year old man admitted iwth N/V/D, generalized weakness and difficulty taking care of himself. He lives with his daughter and her children and walks with a cane at baseline. PMH: ESRD, PAD, PPM, anemia, sarcoidosis, DM2, CHF, HTN, CAD, SDU with craniotomy.   OT comments  Pt moving and completing ADL slowly, but with only supervision. Pt asking to use RW to walk from sink to chair, although he did not have and LOB without a device walking to the sink from opposite side of the bed. He reports he sometimes uses a RW in the morning until he gets going.   Follow Up Recommendations  No OT follow up    Equipment Recommendations  None recommended by OT    Recommendations for Other Services      Precautions / Restrictions Precautions Precautions: Fall       Mobility Bed Mobility Overal bed mobility: Modified Independent             General bed mobility comments: HOB up    Transfers Overall transfer level: Needs assistance Equipment used: None;Rolling walker (2 wheeled) Transfers: Sit to/from Stand Sit to Stand: Supervision         General transfer comment: supervision for safety    Balance Overall balance assessment: Needs assistance   Sitting balance-Leahy Scale: Good       Standing balance-Leahy Scale: Fair                             ADL either performed or assessed with clinical judgement   ADL Overall ADL's : Needs assistance/impaired     Grooming: Supervision/safety;Standing           Upper Body Dressing : Sitting;Minimal assistance Upper Body Dressing Details (indicate cue type and reason): assist to orient gown                 Functional mobility during ADLs: Min guard       Vision       Perception     Praxis      Cognition Arousal/Alertness:  Awake/alert Behavior During Therapy: WFL for tasks assessed/performed Overall Cognitive Status: Within Functional Limits for tasks assessed                                 General Comments: slow to perform ADL        Exercises     Shoulder Instructions       General Comments      Pertinent Vitals/ Pain       Pain Assessment: No/denies pain  Home Living                                          Prior Functioning/Environment              Frequency  Min 2X/week        Progress Toward Goals  OT Goals(current goals can now be found in the care plan section)  Progress towards OT goals: Progressing toward goals  Acute Rehab OT Goals Patient Stated Goal: return home OT Goal Formulation: With patient Time For Goal Achievement: 03/26/21 Potential to  Achieve Goals: Good  Plan Discharge plan remains appropriate    Co-evaluation                 AM-PAC OT "6 Clicks" Daily Activity     Outcome Measure   Help from another person eating meals?: None Help from another person taking care of personal grooming?: A Little Help from another person toileting, which includes using toliet, bedpan, or urinal?: A Little Help from another person bathing (including washing, rinsing, drying)?: A Little Help from another person to put on and taking off regular upper body clothing?: A Little Help from another person to put on and taking off regular lower body clothing?: None 6 Click Score: 20    End of Session Equipment Utilized During Treatment: Gait belt;Rolling walker  OT Visit Diagnosis: Unsteadiness on feet (R26.81);Muscle weakness (generalized) (M62.81)   Activity Tolerance Patient tolerated treatment well   Patient Left in chair;with call bell/phone within reach;with chair alarm set   Nurse Communication          Time: [phone] OT Time Calculation (min): 24 min  Charges: OT General Charges $OT Visit: 1 Visit OT  Treatments $Self Care/Home Management : 23-37 mins  Nestor Lewandowsky, OTR/L Acute Rehabilitation Services Pager: [phone] Office: [phone]   Malka So 03/13/2021, 8:44 AM

## 2021-03-13 NOTE — Telephone Encounter (Signed)
Pt's daughter Reyniel Gilcrest called requested Dr. Pilar Plate call her @ [phone]. She's requesting for Dr. Pilar Plate to speak with doctors over @ the hospital her father was hospitalized on Monday he's in [place] Room# 8

## 2021-03-13 NOTE — Progress Notes (Signed)
DISCHARGE NOTE HOME William [name] to be discharged Home per MD order. Discussed prescriptions and follow up appointments with the patient. Prescriptions given to patient; medication list explained in detail. Patient verbalized understanding.  Skin clean, dry and intact without evidence of skin break down, no evidence of skin tears noted. IV catheter discontinued intact. Site without signs and symptoms of complications. Dressing and pressure applied. Pt denies pain at the site currently. No complaints noted.  Patient free of lines, drains, and wounds.   An After Visit Summary (AVS) was printed and given to the patient. Patient escorted via wheelchair, and discharged home via private auto.  Vira Agar, RN

## 2021-03-13 NOTE — TOC Initial Note (Signed)
Transition of Care [facility]) - Initial/Assessment Note    Patient Details  Name: William [name] MRN: [id number] Date of Birth: 1940-12-11  Transition of [facility]) CM/SW Contact:    Verdell Carmine, RN Phone Number: 03/13/2021, 9:24 AM  Clinical Narrative:                 Patient admitted under obs with weakness, N&V ESRD patient hemodialysis.  Discussion of PD in the future. PT and OT assessment reveals no need to [facility] Patient has 3 daughters for support and a PCP, renal MD. No needs identified at this time CM will follow  Expected Discharge Plan: Home/Self Care Barriers to Discharge: Continued Medical Work up   Patient Goals and CMS Choice        Expected Discharge Plan and Services Expected Discharge Plan: Home/Self Care                                              Prior Living Arrangements/Services   Lives with:: Self Patient language and need for interpreter reviewed:: Yes        Need for Family Participation in Patient Care: Yes (Comment) Care giver support system in place?: Yes (comment)   Criminal Activity/Legal Involvement Pertinent to Current Situation/Hospitalization: No - Comment as needed  Activities of Daily Living Home Assistive Devices/Equipment: Wheelchair,Cane (specify quad or straight) ADL Screening (condition at time of admission) Patient's cognitive ability adequate to safely complete daily activities?: Yes Is the patient deaf or have difficulty hearing?: No Does the patient have difficulty seeing, even when wearing glasses/contacts?: No Does the patient have difficulty concentrating, remembering, or making decisions?: No Patient able to express need for assistance with ADLs?: Yes Does the patient have difficulty dressing or bathing?: Yes Independently performs ADLs?: No Communication: Independent Dressing (OT): Independent Grooming: Independent Feeding: Independent Bathing: Needs assistance Is this a change from baseline?:  Pre-admission baseline Toileting: Independent In/Out Bed: Independent Walks in Home: Independent Does the patient have difficulty walking or climbing stairs?: Yes Weakness of Legs: Both Weakness of Arms/Hands: None  Permission Sought/Granted                  Emotional Assessment       Orientation: : Oriented to Self,Oriented to Place,Oriented to  Time,Oriented to Situation Alcohol / Substance Use: Not Applicable Psych Involvement: No (comment)  Admission diagnosis:  Hyperkalemia [E87.5] Weakness [R53.1] ESRD (end stage renal disease) ([place]) [N18.6] Elevated LFTs [R79.89] Diarrhea, unspecified type [R19.7] Patient Active Problem List   Diagnosis Date Noted  . Elevated transaminase level 03/11/2021  . Diarrhea 03/11/2021  . ESRD (end stage renal disease) ([place]) 03/10/2021  . General weakness 10/23/2020  . Pressure injury of skin 10/22/2020  . Weakness 10/22/2020  . Fall   . COVID-19 virus infection   . Transient loss of consciousness 09/08/2020  . Hypovitaminosis D 02/06/2020  . PAD (peripheral artery disease) ([place]) 02/06/2020  . 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  . 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 Encounter for removal of sutures 12/13/2018  . Iron deficiency anemia, unspecified 11/30/2018  . ESRD (end stage renal disease) on dialysis ([place]) 11/19/2018  . 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 Chronic systolic (congestive) heart failure ([place]) 11/18/2018  . Other specified coagulation defects ([place]) 11/18/2018  . Pain, unspecified 11/18/2018  . Sarcoidosis, unspecified 11/18/2018  . Secondary hyperparathyroidism of renal origin ([place]) 11/18/2018  .  Shortness of breath 11/18/2018  . Type 2 diabetes mellitus with diabetic peripheral angiopathy without gangrene ([place]) 11/18/2018  . Acute on chronic combined systolic and diastolic CHF (congestive heart failure) ([place])   . Renal cyst 10/17/2018  . Ventricular tachycardia, non-sustained ([place])   . Decreased activities of  daily living (ADL)   . 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 Dehydration   . Gait instability 01/06/2018  . Type 2 diabetes mellitus without complication, without long-term current use of insulin ([place]) 10/30/2017  . Benign essential HTN 10/30/2017  . Hyperlipidemia 10/30/2017  . Ethmoid sinusitis 07/11/2015  . Subdural hematoma ([place]) 07/08/2015  . CAD (coronary artery disease) 10/26/2014  . 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   PCP:  Matilde Haymaker, MD Pharmacy:   [facility] ([place]), [place] - [address] [place] ([place]) [place] [address] Phone: [phone] Fax: [facility] [address] [place] [facility] [place] [address] Phone: [phone] Fax: [phone]     Social Determinants of Health (SDOH) Interventions    Readmission Risk Interventions No flowsheet data found.

## 2021-03-14 NOTE — Telephone Encounter (Signed)
William [name] was called and encouraged to follow-up in clinic in 1 week to address her concerns about physical therapy and Occupational Therapy. Matilde Haymaker, MD

## 2021-03-15 LAB — CULTURE, BLOOD (ROUTINE X 2): Culture: NO GROWTH

## 2021-03-19 NOTE — Progress Notes (Signed)
Remote pacemaker transmission.   

## 2021-03-25 ENCOUNTER — Encounter: Payer: Self-pay | Admitting: Family Medicine

## 2021-03-25 ENCOUNTER — Telehealth: Payer: Self-pay | Admitting: Nurse Practitioner

## 2021-03-25 ENCOUNTER — Other Ambulatory Visit (HOSPITAL_COMMUNITY): Payer: Self-pay | Admitting: Nephrology

## 2021-03-25 ENCOUNTER — Ambulatory Visit (INDEPENDENT_AMBULATORY_CARE_PROVIDER_SITE_OTHER): Payer: Medicare PPO | Admitting: Family Medicine

## 2021-03-25 ENCOUNTER — Other Ambulatory Visit: Payer: Self-pay

## 2021-03-25 ENCOUNTER — Other Ambulatory Visit: Payer: Medicare PPO | Admitting: Nurse Practitioner

## 2021-03-25 VITALS — BP 98/62 | HR 66 | Ht 67.0 in | Wt 144.0 lb

## 2021-03-25 DIAGNOSIS — R531 Weakness: Secondary | ICD-10-CM

## 2021-03-25 DIAGNOSIS — Z992 Dependence on renal dialysis: Secondary | ICD-10-CM

## 2021-03-25 DIAGNOSIS — N186 End stage renal disease: Secondary | ICD-10-CM | POA: Diagnosis not present

## 2021-03-25 NOTE — Telephone Encounter (Signed)
I called William [name], Mr. Oswalt daughter to confirm PC visit, [name] endorses Mr. Pigeon was not feeling well today and she was going to take him to primary, will need to reschedule. Rescheduled per request

## 2021-03-25 NOTE — Progress Notes (Signed)
    SUBJECTIVE:   CHIEF COMPLAINT / HPI:   Weakness: Patient states that he feels more weak since discharge from the hospital.  States he actually feels worse than when he was admitted.  Agrees that going to the hospital at this time would probably not improve things.  Patient and daughter states that he started using his wheelchair that he had previously.  Before that he mostly ambulated with walker.  He can no longer do things such as cook for himself due to weakness.  Gets winded very easily.  At rest his breathing is normal.  Patient denies nausea, vomiting, diarrhea, fever or chills.  Having 1-2 normal bowel movements a day.  Diarrhea has improved since discharge.  Home health physical therapy has not come to his place since his discharge.  States they are old orders expired.  Patient urinates 1-2 times a day.  He does not miss any of his hemodialysis appointments.  He has brought up the idea of peritoneal dialysis at his dialysis appointment but states nothing has, it.  PERTINENT  PMH / PSH: ESRD  OBJECTIVE:   BP 98/62   Pulse 66   Ht '5\' 7"'$  (1.702 m)   Wt 144 lb (65.3 kg)   SpO2 99%   BMI 22.55 kg/m   General: Alert and oriented.  No acute distress.  Appears stated age.  Thin.  Using walker.  Accompanied by daughter. CV: Regular rate and rhythm, no murmurs. Pulmonary: Lungs good auscultation bilaterally, no wheezes or crackles. Extremities: No lower extremity edema. MSK: Slow to rise from seated position.  Gait is small, slow shuffling steps using walker.  ASSESSMENT/PLAN:   General weakness Most likely due to a combination of kidney failure and deconditioning.  Recently was hospitalized and since discharge has not worked with physical therapy who previously came to his house.  Recent work-up from hospitalization unrevealing.  Compliant with dialysis.  We will get CBC and CMP today.  We will send in orders for home health PT.  Do not think that hospitalization would result in any  treatment that is not available to him outpatient.  We will reevaluate status after lab work resulted.  ESRD (end stage renal disease) on dialysis [facility]) Patient states desire to consider peritoneal dialysis.  Has not talked about it with his nephrologist.  Encouraged him to make an appointment with nephrologist to discuss this.  Major barrier at this time appears to be his reliance on others to help him set up equipment on a daily basis.  Chronic lives with daughter but she has other responsibilities that might interfere.  Unlikely be able to lift the peritoneal dialysis bags himself with current level of weakness.Benay Pike, MD [place]

## 2021-03-25 NOTE — Patient Instructions (Signed)
It was nice to see you today,  I will call you with the results of your lab work when I get them.  I will try to reestablish home health physical therapy for you.  If you start to feel worse and feel like you need to be evaluated at the hospital before I talk to you again please go to the emergency department.  Please make an appointment with your nephrologist to discuss the possibility of peritoneal dialysis.  Have a great day,  Clemetine Marker, MD

## 2021-03-26 ENCOUNTER — Encounter (HOSPITAL_COMMUNITY): Payer: Self-pay

## 2021-03-26 ENCOUNTER — Telehealth: Payer: Self-pay

## 2021-03-26 ENCOUNTER — Emergency Department (HOSPITAL_COMMUNITY): Payer: Medicare PPO

## 2021-03-26 ENCOUNTER — Other Ambulatory Visit: Payer: Self-pay

## 2021-03-26 ENCOUNTER — Emergency Department (HOSPITAL_COMMUNITY)
Admission: EM | Admit: 2021-03-26 | Discharge: 2021-03-26 | Disposition: A | Discharge status: Home / Self Care | Payer: Medicare PPO | Source: Home / Self Care | Attending: Emergency Medicine | Admitting: Emergency Medicine

## 2021-03-26 DIAGNOSIS — I5043 Acute on chronic combined systolic (congestive) and diastolic (congestive) heart failure: Secondary | ICD-10-CM | POA: Insufficient documentation

## 2021-03-26 DIAGNOSIS — Z992 Dependence on renal dialysis: Secondary | ICD-10-CM

## 2021-03-26 DIAGNOSIS — Z79899 Other long term (current) drug therapy: Secondary | ICD-10-CM | POA: Insufficient documentation

## 2021-03-26 DIAGNOSIS — A419 Sepsis, unspecified organism: Secondary | ICD-10-CM | POA: Diagnosis not present

## 2021-03-26 DIAGNOSIS — N186 End stage renal disease: Secondary | ICD-10-CM

## 2021-03-26 DIAGNOSIS — E1169 Type 2 diabetes mellitus with other specified complication: Secondary | ICD-10-CM | POA: Insufficient documentation

## 2021-03-26 DIAGNOSIS — R748 Abnormal levels of other serum enzymes: Secondary | ICD-10-CM

## 2021-03-26 DIAGNOSIS — R945 Abnormal results of liver function studies: Secondary | ICD-10-CM | POA: Insufficient documentation

## 2021-03-26 DIAGNOSIS — E1151 Type 2 diabetes mellitus with diabetic peripheral angiopathy without gangrene: Secondary | ICD-10-CM | POA: Insufficient documentation

## 2021-03-26 DIAGNOSIS — Z7982 Long term (current) use of aspirin: Secondary | ICD-10-CM | POA: Insufficient documentation

## 2021-03-26 DIAGNOSIS — E785 Hyperlipidemia, unspecified: Secondary | ICD-10-CM | POA: Insufficient documentation

## 2021-03-26 DIAGNOSIS — Z20822 Contact with and (suspected) exposure to covid-19: Secondary | ICD-10-CM | POA: Insufficient documentation

## 2021-03-26 DIAGNOSIS — R531 Weakness: Secondary | ICD-10-CM

## 2021-03-26 DIAGNOSIS — Z950 Presence of cardiac pacemaker: Secondary | ICD-10-CM | POA: Insufficient documentation

## 2021-03-26 DIAGNOSIS — D631 Anemia in chronic kidney disease: Secondary | ICD-10-CM | POA: Insufficient documentation

## 2021-03-26 DIAGNOSIS — T827XXA Infection and inflammatory reaction due to other cardiac and vascular devices, implants and grafts, initial encounter: Secondary | ICD-10-CM | POA: Diagnosis not present

## 2021-03-26 DIAGNOSIS — I2510 Atherosclerotic heart disease of native coronary artery without angina pectoris: Secondary | ICD-10-CM | POA: Insufficient documentation

## 2021-03-26 DIAGNOSIS — I132 Hypertensive heart and chronic kidney disease with heart failure and with stage 5 chronic kidney disease, or end stage renal disease: Secondary | ICD-10-CM | POA: Insufficient documentation

## 2021-03-26 DIAGNOSIS — E1122 Type 2 diabetes mellitus with diabetic chronic kidney disease: Secondary | ICD-10-CM | POA: Insufficient documentation

## 2021-03-26 DIAGNOSIS — Z8616 Personal history of COVID-19: Secondary | ICD-10-CM | POA: Insufficient documentation

## 2021-03-26 DIAGNOSIS — N185 Chronic kidney disease, stage 5: Secondary | ICD-10-CM | POA: Insufficient documentation

## 2021-03-26 LAB — COMPREHENSIVE METABOLIC PANEL
ALT: 178 IU/L — ABNORMAL HIGH (ref 0–44)
ALT: 229 U/L — ABNORMAL HIGH (ref 0–44)
AST: 154 IU/L — ABNORMAL HIGH (ref 0–40)
AST: 187 U/L — ABNORMAL HIGH (ref 15–41)
Albumin/Globulin Ratio: 1.4 (ref 1.2–2.2)
Albumin: 4.1 g/dL (ref 3.7–4.7)
Albumin: 3.3 g/dL — ABNORMAL LOW (ref 3.5–5.0)
Alkaline Phosphatase: 271 IU/L — ABNORMAL HIGH (ref 44–121)
Alkaline Phosphatase: 203 U/L — ABNORMAL HIGH (ref 38–126)
Anion gap: 18 — ABNORMAL HIGH (ref 5–15)
BUN/Creatinine Ratio: 5 — ABNORMAL LOW (ref 10–24)
BUN: 48 mg/dL — ABNORMAL HIGH (ref 8–27)
BUN: 43 mg/dL — ABNORMAL HIGH (ref 8–23)
Bilirubin Total: 0.8 mg/dL (ref 0.0–1.2)
CO2: 21 mmol/L (ref 20–29)
CO2: 24 mmol/L (ref 22–32)
Calcium: 9.2 mg/dL (ref 8.6–10.2)
Calcium: 8.8 mg/dL — ABNORMAL LOW (ref 8.9–10.3)
Chloride: 90 mmol/L — ABNORMAL LOW (ref 96–106)
Chloride: 95 mmol/L — ABNORMAL LOW (ref 98–111)
Creatinine, Ser: 9.28 mg/dL — ABNORMAL HIGH (ref 0.76–1.27)
Creatinine, Ser: 8.88 mg/dL — ABNORMAL HIGH (ref 0.61–1.24)
GFR, Estimated: 6 mL/min — ABNORMAL LOW (ref >60)
Globulin, Total: 2.9 g/dL (ref 1.5–4.5)
Glucose, Bld: 95 mg/dL (ref 70–99)
Glucose: 113 mg/dL — ABNORMAL HIGH (ref 65–99)
Potassium: 4.5 mmol/L (ref 3.5–5.2)
Potassium: 3.8 mmol/L (ref 3.5–5.1)
Sodium: 134 mmol/L (ref 134–144)
Sodium: 137 mmol/L (ref 135–145)
Total Bilirubin: 0.9 mg/dL (ref 0.3–1.2)
Total Protein: 7 g/dL (ref 6.0–8.5)
Total Protein: 7.6 g/dL (ref 6.5–8.1)
eGFR: 5 mL/min/{1.73_m2} — ABNORMAL LOW (ref >59)

## 2021-03-26 LAB — CBC
HCT: 31.3 % — ABNORMAL LOW (ref 39.0–52.0)
Hematocrit: 28.3 % — ABNORMAL LOW (ref 37.5–51.0)
Hemoglobin: 9.4 g/dL — ABNORMAL LOW (ref 13.0–17.7)
Hemoglobin: 9.9 g/dL — ABNORMAL LOW (ref 13.0–17.0)
MCH: 31.8 pg (ref 26.6–33.0)
MCH: 32.1 pg (ref 26.0–34.0)
MCHC: 33.2 g/dL (ref 31.5–35.7)
MCHC: 31.6 g/dL (ref 30.0–36.0)
MCV: 96 fL (ref 79–97)
MCV: 101.6 fL — ABNORMAL HIGH (ref 80.0–100.0)
Platelets: 289 10*3/uL (ref 150–450)
Platelets: 264 10*3/uL (ref 150–400)
RBC: 2.96 x10E6/uL — ABNORMAL LOW (ref 4.14–5.80)
RBC: 3.08 MIL/uL — ABNORMAL LOW (ref 4.22–5.81)
RDW: 14.6 % (ref 11.6–15.4)
RDW: 15.4 % (ref 11.5–15.5)
WBC: 10 10*3/uL (ref 3.4–10.8)
WBC: 12.9 10*3/uL — ABNORMAL HIGH (ref 4.0–10.5)
nRBC: 0.5 % — ABNORMAL HIGH (ref 0.0–0.2)

## 2021-03-26 LAB — PROTIME-INR
INR: 1.2 (ref 0.8–1.2)
Prothrombin Time: 15.2 seconds (ref 11.4–15.2)

## 2021-03-26 LAB — APTT: aPTT: 52 seconds — ABNORMAL HIGH (ref 24–36)

## 2021-03-26 LAB — DIFFERENTIAL
Abs Immature Granulocytes: 0.1 10*3/uL — ABNORMAL HIGH (ref 0.00–0.07)
Basophils Absolute: 0.1 10*3/uL (ref 0.0–0.1)
Basophils Relative: 1 %
Eosinophils Absolute: 0 10*3/uL (ref 0.0–0.5)
Eosinophils Relative: 0 %
Immature Granulocytes: 1 %
Lymphocytes Relative: 7 %
Lymphs Abs: 0.9 10*3/uL (ref 0.7–4.0)
Monocytes Absolute: 1.5 10*3/uL — ABNORMAL HIGH (ref 0.1–1.0)
Monocytes Relative: 12 %
Neutro Abs: 10.2 10*3/uL — ABNORMAL HIGH (ref 1.7–7.7)
Neutrophils Relative %: 79 %

## 2021-03-26 LAB — RESP PANEL BY RT-PCR (FLU A&B, COVID) ARPGX2
Influenza A by PCR: NEGATIVE
Influenza B by PCR: NEGATIVE
SARS Coronavirus 2 by RT PCR: NEGATIVE

## 2021-03-26 NOTE — ED Notes (Signed)
Daughter called to dc pt home. Mrs. Lorenso Courier updated on pt's POC by this RN and by Dr. Tomi Bamberger. Pt AO x4 and refusing to be admitted on the hospital or stay any longer on the ED. Pt still connected to vitals sign monitor while awaiting for family to arrive.

## 2021-03-26 NOTE — Discharge Instructions (Addendum)
Continue your current medications.  Return to the ED as needed for worsening symptoms.  Follow-up with your doctor to be rechecked as planned
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
## 2021-03-26 NOTE — Telephone Encounter (Signed)
Patients daughter calls nurse line reporting worsening symptoms from yesterday. Daughter reports he is very more weak and has developed fever/chills. Patient is at dialysis now and they called her to have him be picked up. I advised daughter to take him to the ED per providers FU instructions of worsening symptoms.Tmax is 99.7 according to dialysis nurse. Daughter agreed to take him to ED. She is asking about lab results. Will forward to provider who saw patient yesterday.

## 2021-03-26 NOTE — ED Triage Notes (Signed)
Pt BIB [facility] from dialysis. Pt has had bilateral weakness in legs for 4 days. Pt received 3 hrs out of his 4hr treatment. Per dialysis they checked his temp and it was 99.3 so they stopped treatment and called EMS. Pt denies any pain. Pt denies N/V/D.

## 2021-03-26 NOTE — Assessment & Plan Note (Signed)
Most likely due to a combination of kidney failure and deconditioning.  Recently was hospitalized and since discharge has not worked with physical therapy who previously came to his house.  Recent work-up from hospitalization unrevealing.  Compliant with dialysis.  We will get CBC and CMP today.  We will send in orders for home health PT.  Do not think that hospitalization would result in any treatment that is not available to him outpatient.  We will reevaluate status after lab work resulted.

## 2021-03-26 NOTE — ED Notes (Signed)
Pt given a sandwich and a sprite 

## 2021-03-26 NOTE — Assessment & Plan Note (Signed)
Patient states desire to consider peritoneal dialysis.  Has not talked about it with his nephrologist.  Encouraged him to make an appointment with nephrologist to discuss this.  Major barrier at this time appears to be his reliance on others to help him set up equipment on a daily basis.  Chronic lives with daughter but she has other responsibilities that might interfere.  Unlikely be able to lift the peritoneal dialysis bags himself with current level of weakness.William [name]

## 2021-03-26 NOTE — ED Notes (Signed)
Gone to CT

## 2021-03-26 NOTE — Telephone Encounter (Signed)
Called and LVM about results.  Results look consistent with previous values. Nothing concerning seen.  Agree that if he is feeling worse with new symptoms that he should be evaluated in the ED.

## 2021-03-26 NOTE — ED Notes (Signed)
Daughter called to pick up pt 

## 2021-03-27 NOTE — Telephone Encounter (Signed)
Patient's daughter LVM on nurse line. Daughter reports that they went to the ED yesterday, however, still has concerns for patient's weakness. Attempted to return call to daughter. No answer, left HIPAA compliant VM for patient's daughter to return call to nurse line to discuss further.   Talbot Grumbling, RN

## 2021-03-28 ENCOUNTER — Emergency Department (HOSPITAL_COMMUNITY): Payer: Medicare PPO

## 2021-03-28 ENCOUNTER — Other Ambulatory Visit: Payer: Self-pay

## 2021-03-28 ENCOUNTER — Inpatient Hospital Stay (HOSPITAL_COMMUNITY)
Admission: EM | Admit: 2021-03-28 | Discharge: 2021-04-03 | DRG: 252 | Disposition: A | Discharge status: Home Health | Payer: Medicare PPO | Attending: Family Medicine | Admitting: Family Medicine

## 2021-03-28 ENCOUNTER — Ambulatory Visit (HOSPITAL_COMMUNITY)
Admission: RE | Admit: 2021-03-28 | Discharge: 2021-03-28 | Disposition: A | Discharge status: Home / Self Care | Payer: Medicare PPO | Source: Ambulatory Visit | Attending: Nephrology | Admitting: Nephrology

## 2021-03-28 DIAGNOSIS — I472 Ventricular tachycardia: Secondary | ICD-10-CM | POA: Diagnosis present

## 2021-03-28 DIAGNOSIS — A408 Other streptococcal sepsis: Secondary | ICD-10-CM | POA: Diagnosis not present

## 2021-03-28 DIAGNOSIS — Z6822 Body mass index (BMI) 22.0-22.9, adult: Secondary | ICD-10-CM

## 2021-03-28 DIAGNOSIS — I441 Atrioventricular block, second degree: Secondary | ICD-10-CM | POA: Diagnosis present

## 2021-03-28 DIAGNOSIS — E1169 Type 2 diabetes mellitus with other specified complication: Secondary | ICD-10-CM | POA: Diagnosis present

## 2021-03-28 DIAGNOSIS — Z20822 Contact with and (suspected) exposure to covid-19: Secondary | ICD-10-CM | POA: Diagnosis present

## 2021-03-28 DIAGNOSIS — R54 Age-related physical debility: Secondary | ICD-10-CM | POA: Diagnosis not present

## 2021-03-28 DIAGNOSIS — I1311 Hypertensive heart and chronic kidney disease without heart failure, with stage 5 chronic kidney disease, or end stage renal disease: Secondary | ICD-10-CM | POA: Diagnosis present

## 2021-03-28 DIAGNOSIS — T827XXD Infection and inflammatory reaction due to other cardiac and vascular devices, implants and grafts, subsequent encounter: Secondary | ICD-10-CM | POA: Diagnosis not present

## 2021-03-28 DIAGNOSIS — Z789 Other specified health status: Secondary | ICD-10-CM | POA: Diagnosis present

## 2021-03-28 DIAGNOSIS — Z992 Dependence on renal dialysis: Secondary | ICD-10-CM | POA: Diagnosis not present

## 2021-03-28 DIAGNOSIS — Z66 Do not resuscitate: Secondary | ICD-10-CM | POA: Diagnosis present

## 2021-03-28 DIAGNOSIS — E1122 Type 2 diabetes mellitus with diabetic chronic kidney disease: Secondary | ICD-10-CM | POA: Diagnosis present

## 2021-03-28 DIAGNOSIS — E1151 Type 2 diabetes mellitus with diabetic peripheral angiopathy without gangrene: Secondary | ICD-10-CM | POA: Diagnosis present

## 2021-03-28 DIAGNOSIS — E1159 Type 2 diabetes mellitus with other circulatory complications: Secondary | ICD-10-CM | POA: Diagnosis present

## 2021-03-28 DIAGNOSIS — R7881 Bacteremia: Secondary | ICD-10-CM | POA: Diagnosis not present

## 2021-03-28 DIAGNOSIS — Z794 Long term (current) use of insulin: Secondary | ICD-10-CM | POA: Diagnosis not present

## 2021-03-28 DIAGNOSIS — R7401 Elevation of levels of liver transaminase levels: Secondary | ICD-10-CM | POA: Diagnosis present

## 2021-03-28 DIAGNOSIS — I2510 Atherosclerotic heart disease of native coronary artery without angina pectoris: Secondary | ICD-10-CM | POA: Diagnosis present

## 2021-03-28 DIAGNOSIS — T827XXA Infection and inflammatory reaction due to other cardiac and vascular devices, implants and grafts, initial encounter: Principal | ICD-10-CM | POA: Diagnosis present

## 2021-03-28 DIAGNOSIS — I739 Peripheral vascular disease, unspecified: Secondary | ICD-10-CM | POA: Diagnosis present

## 2021-03-28 DIAGNOSIS — I5042 Chronic combined systolic (congestive) and diastolic (congestive) heart failure: Secondary | ICD-10-CM | POA: Diagnosis present

## 2021-03-28 DIAGNOSIS — Z8616 Personal history of COVID-19: Secondary | ICD-10-CM

## 2021-03-28 DIAGNOSIS — E7800 Pure hypercholesterolemia, unspecified: Secondary | ICD-10-CM | POA: Diagnosis present

## 2021-03-28 DIAGNOSIS — R6520 Severe sepsis without septic shock: Secondary | ICD-10-CM | POA: Diagnosis present

## 2021-03-28 DIAGNOSIS — E440 Moderate protein-calorie malnutrition: Secondary | ICD-10-CM | POA: Diagnosis present

## 2021-03-28 DIAGNOSIS — M47816 Spondylosis without myelopathy or radiculopathy, lumbar region: Secondary | ICD-10-CM | POA: Diagnosis present

## 2021-03-28 DIAGNOSIS — Z888 Allergy status to other drugs, medicaments and biological substances status: Secondary | ICD-10-CM

## 2021-03-28 DIAGNOSIS — A419 Sepsis, unspecified organism: Secondary | ICD-10-CM

## 2021-03-28 DIAGNOSIS — I700 Atherosclerosis of aorta: Secondary | ICD-10-CM | POA: Diagnosis present

## 2021-03-28 DIAGNOSIS — N186 End stage renal disease: Secondary | ICD-10-CM | POA: Diagnosis present

## 2021-03-28 DIAGNOSIS — Z950 Presence of cardiac pacemaker: Secondary | ICD-10-CM | POA: Diagnosis not present

## 2021-03-28 DIAGNOSIS — R188 Other ascites: Secondary | ICD-10-CM | POA: Diagnosis present

## 2021-03-28 DIAGNOSIS — Y841 Kidney dialysis as the cause of abnormal reaction of the patient, or of later complication, without mention of misadventure at the time of the procedure: Secondary | ICD-10-CM | POA: Diagnosis present

## 2021-03-28 DIAGNOSIS — Z1611 Resistance to penicillins: Secondary | ICD-10-CM | POA: Diagnosis present

## 2021-03-28 DIAGNOSIS — I10 Essential (primary) hypertension: Secondary | ICD-10-CM | POA: Diagnosis not present

## 2021-03-28 DIAGNOSIS — F518 Other sleep disorders not due to a substance or known physiological condition: Secondary | ICD-10-CM | POA: Diagnosis present

## 2021-03-28 DIAGNOSIS — N2581 Secondary hyperparathyroidism of renal origin: Secondary | ICD-10-CM | POA: Diagnosis present

## 2021-03-28 DIAGNOSIS — I152 Hypertension secondary to endocrine disorders: Secondary | ICD-10-CM | POA: Diagnosis present

## 2021-03-28 DIAGNOSIS — B957 Other staphylococcus as the cause of diseases classified elsewhere: Secondary | ICD-10-CM | POA: Diagnosis not present

## 2021-03-28 DIAGNOSIS — I5022 Chronic systolic (congestive) heart failure: Secondary | ICD-10-CM | POA: Diagnosis present

## 2021-03-28 DIAGNOSIS — Z79899 Other long term (current) drug therapy: Secondary | ICD-10-CM

## 2021-03-28 DIAGNOSIS — I132 Hypertensive heart and chronic kidney disease with heart failure and with stage 5 chronic kidney disease, or end stage renal disease: Secondary | ICD-10-CM | POA: Diagnosis present

## 2021-03-28 DIAGNOSIS — D631 Anemia in chronic kidney disease: Secondary | ICD-10-CM | POA: Diagnosis present

## 2021-03-28 DIAGNOSIS — R296 Repeated falls: Secondary | ICD-10-CM | POA: Diagnosis present

## 2021-03-28 DIAGNOSIS — A411 Sepsis due to other specified staphylococcus: Secondary | ICD-10-CM | POA: Diagnosis present

## 2021-03-28 DIAGNOSIS — N185 Chronic kidney disease, stage 5: Secondary | ICD-10-CM | POA: Diagnosis not present

## 2021-03-28 DIAGNOSIS — R531 Weakness: Secondary | ICD-10-CM

## 2021-03-28 DIAGNOSIS — Z7982 Long term (current) use of aspirin: Secondary | ICD-10-CM

## 2021-03-28 HISTORY — DX: Sepsis, unspecified organism: A41.9

## 2021-03-28 LAB — COMPREHENSIVE METABOLIC PANEL
ALT: 170 U/L — ABNORMAL HIGH (ref 0–44)
AST: 73 U/L — ABNORMAL HIGH (ref 15–41)
Albumin: 3.3 g/dL — ABNORMAL LOW (ref 3.5–5.0)
Alkaline Phosphatase: 234 U/L — ABNORMAL HIGH (ref 38–126)
Anion gap: 20 — ABNORMAL HIGH (ref 5–15)
BUN: 62 mg/dL — ABNORMAL HIGH (ref 8–23)
CO2: 21 mmol/L — ABNORMAL LOW (ref 22–32)
Calcium: 9.2 mg/dL (ref 8.9–10.3)
Chloride: 93 mmol/L — ABNORMAL LOW (ref 98–111)
Creatinine, Ser: 12.03 mg/dL — ABNORMAL HIGH (ref 0.61–1.24)
GFR, Estimated: 4 mL/min — ABNORMAL LOW (ref >60)
Glucose, Bld: 122 mg/dL — ABNORMAL HIGH (ref 70–99)
Potassium: 4.3 mmol/L (ref 3.5–5.1)
Sodium: 134 mmol/L — ABNORMAL LOW (ref 135–145)
Total Bilirubin: 1.9 mg/dL — ABNORMAL HIGH (ref 0.3–1.2)
Total Protein: 7.8 g/dL (ref 6.5–8.1)

## 2021-03-28 LAB — CBC WITH DIFFERENTIAL/PLATELET
Abs Immature Granulocytes: 0.08 10*3/uL — ABNORMAL HIGH (ref 0.00–0.07)
Basophils Absolute: 0 10*3/uL (ref 0.0–0.1)
Basophils Relative: 0 %
Eosinophils Absolute: 0 10*3/uL (ref 0.0–0.5)
Eosinophils Relative: 0 %
HCT: 33.8 % — ABNORMAL LOW (ref 39.0–52.0)
Hemoglobin: 11.2 g/dL — ABNORMAL LOW (ref 13.0–17.0)
Immature Granulocytes: 1 %
Lymphocytes Relative: 3 %
Lymphs Abs: 0.5 10*3/uL — ABNORMAL LOW (ref 0.7–4.0)
MCH: 32.8 pg (ref 26.0–34.0)
MCHC: 33.1 g/dL (ref 30.0–36.0)
MCV: 99.1 fL (ref 80.0–100.0)
Monocytes Absolute: 0.8 10*3/uL (ref 0.1–1.0)
Monocytes Relative: 6 %
Neutro Abs: 12.6 10*3/uL — ABNORMAL HIGH (ref 1.7–7.7)
Neutrophils Relative %: 90 %
Platelets: 304 10*3/uL (ref 150–400)
RBC: 3.41 MIL/uL — ABNORMAL LOW (ref 4.22–5.81)
RDW: 15.5 % (ref 11.5–15.5)
WBC: 14 10*3/uL — ABNORMAL HIGH (ref 4.0–10.5)
nRBC: 0.4 % — ABNORMAL HIGH (ref 0.0–0.2)

## 2021-03-28 LAB — CBG MONITORING, ED
Glucose-Capillary: 123 mg/dL — ABNORMAL HIGH (ref 70–99)
Glucose-Capillary: 94 mg/dL (ref 70–99)

## 2021-03-28 LAB — RESP PANEL BY RT-PCR (FLU A&B, COVID) ARPGX2
Influenza A by PCR: NEGATIVE
Influenza B by PCR: NEGATIVE
SARS Coronavirus 2 by RT PCR: NEGATIVE

## 2021-03-28 LAB — PROTIME-INR
INR: 1.2 (ref 0.8–1.2)
Prothrombin Time: 15.3 seconds — ABNORMAL HIGH (ref 11.4–15.2)

## 2021-03-28 LAB — I-STAT CHEM 8, ED
BUN: 57 mg/dL — ABNORMAL HIGH (ref 8–23)
Calcium, Ion: 0.99 mmol/L — ABNORMAL LOW (ref 1.15–1.40)
Chloride: 95 mmol/L — ABNORMAL LOW (ref 98–111)
Creatinine, Ser: 12.8 mg/dL — ABNORMAL HIGH (ref 0.61–1.24)
Glucose, Bld: 121 mg/dL — ABNORMAL HIGH (ref 70–99)
HCT: 35 % — ABNORMAL LOW (ref 39.0–52.0)
Hemoglobin: 11.9 g/dL — ABNORMAL LOW (ref 13.0–17.0)
Potassium: 4.2 mmol/L (ref 3.5–5.1)
Sodium: 134 mmol/L — ABNORMAL LOW (ref 135–145)
TCO2: 22 mmol/L (ref 22–32)

## 2021-03-28 LAB — LIPASE, BLOOD: Lipase: 61 U/L — ABNORMAL HIGH (ref 11–51)

## 2021-03-28 LAB — LACTIC ACID, PLASMA
Lactic Acid, Venous: 3 mmol/L (ref 0.5–1.9)
Lactic Acid, Venous: 1.6 mmol/L (ref 0.5–1.9)

## 2021-03-28 LAB — APTT: aPTT: 26 seconds (ref 24–36)

## 2021-03-28 MED ORDER — FENTANYL CITRATE (PF) 100 MCG/2ML IJ SOLN
50.0000 ug | Freq: Once | INTRAMUSCULAR | Status: AC
  Administered 2021-03-28: 50 ug via INTRAVENOUS
  Filled 2021-03-28: qty 2

## 2021-03-28 MED ORDER — ASPIRIN 81 MG PO CHEW
81.0000 mg | CHEWABLE_TABLET | Freq: Every day | ORAL | Status: DC
  Administered 2021-03-28 – 2021-04-03 (×6): 81 mg via ORAL
  Filled 2021-03-28 (×6): qty 1

## 2021-03-28 MED ORDER — ONDANSETRON HCL 4 MG/2ML IJ SOLN
4.0000 mg | Freq: Once | INTRAMUSCULAR | Status: AC
  Administered 2021-03-28: 4 mg via INTRAVENOUS
  Filled 2021-03-28: qty 2

## 2021-03-28 MED ORDER — VANCOMYCIN VARIABLE DOSE PER UNSTABLE RENAL FUNCTION (PHARMACIST DOSING): Status: DC

## 2021-03-28 MED ORDER — ACETAMINOPHEN 325 MG PO TABS
650.0000 mg | ORAL_TABLET | Freq: Once | ORAL | Status: AC
  Administered 2021-03-28: 650 mg via ORAL
  Filled 2021-03-28: qty 2

## 2021-03-28 MED ORDER — SODIUM CHLORIDE 0.9 % IV SOLN
1.0000 g | INTRAVENOUS | Status: DC
  Administered 2021-03-29: 1 g via INTRAVENOUS
  Filled 2021-03-28 (×3): qty 1

## 2021-03-28 MED ORDER — SODIUM CHLORIDE 0.9 % IV SOLN
2.0000 g | Freq: Once | INTRAVENOUS | Status: AC
  Administered 2021-03-28: 2 g via INTRAVENOUS
  Filled 2021-03-28: qty 2

## 2021-03-28 MED ORDER — METOPROLOL TARTRATE 25 MG PO TABS
25.0000 mg | ORAL_TABLET | Freq: Two times a day (BID) | ORAL | Status: DC
  Administered 2021-03-28 – 2021-03-29 (×3): 25 mg via ORAL
  Filled 2021-03-28 (×3): qty 1

## 2021-03-28 MED ORDER — SODIUM CHLORIDE 0.9 % IV BOLUS
500.0000 mL | Freq: Once | INTRAVENOUS | Status: AC
Start: 2021-03-28 — End: 2021-03-28
  Administered 2021-03-28: 500 mL via INTRAVENOUS

## 2021-03-28 MED ORDER — VANCOMYCIN HCL 1500 MG/300ML IV SOLN
1500.0000 mg | Freq: Once | INTRAVENOUS | Status: AC
  Administered 2021-03-28: 1500 mg via INTRAVENOUS
  Filled 2021-03-28: qty 300

## 2021-03-28 MED ORDER — ATORVASTATIN CALCIUM 40 MG PO TABS
40.0000 mg | ORAL_TABLET | Freq: Every day | ORAL | Status: DC
  Administered 2021-03-28 – 2021-04-03 (×6): 40 mg via ORAL
  Filled 2021-03-28 (×6): qty 1

## 2021-03-28 MED ORDER — METRONIDAZOLE 500 MG/100ML IV SOLN
500.0000 mg | Freq: Once | INTRAVENOUS | Status: AC
  Administered 2021-03-28: 500 mg via INTRAVENOUS
  Filled 2021-03-28: qty 100

## 2021-03-28 MED ORDER — HEPARIN SODIUM (PORCINE) 5000 UNIT/ML IJ SOLN
5000.0000 [IU] | Freq: Three times a day (TID) | INTRAMUSCULAR | Status: DC
  Administered 2021-03-28 – 2021-04-03 (×16): 5000 [IU] via SUBCUTANEOUS
  Filled 2021-03-28 (×15): qty 1

## 2021-03-28 NOTE — Sepsis Progress Note (Signed)
eLink is following this Code Sepsis. °

## 2021-03-28 NOTE — ED Notes (Signed)
Patient transported to Ultrasound 
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
## 2021-03-28 NOTE — ED Notes (Signed)
Daughter Rise Paganini [phone] would like an update when possible

## 2021-03-28 NOTE — Progress Notes (Signed)
Patient presented to IR as an outpatient today for exchange of right femoral tunneled HD catheter - per patient's daughter who is at bedside with him today he has been feeling weak for several weeks but otherwise at baseline, on Wednesday he presented to the dialysis center and completed 3 of 4 hours of treatment before the treatment was stopped. Per chart review patient was seen by his PCP on 6/15 with complaints of weakness, workup in the office was non revealing and he was encouraged to follow up with home health/PT for deconditioning. He was found to be COVID (-).  Later that same day he was sent to ED due temperature of 99.3 during dialysis, his workup in the ED was not concerning for any acute process and he was offered admission if he felt unsafe going home but patient declined and was discharged home.  His daughter states that he began having chills, low grade fever, n/v and abdominal pain off and on since yesterday. He presented to dialysis today but the catheter did not work and he was unable to receive treatment. He was told to come to IR today to have dialysis catheter exchanged so he could have dialysis tomorrow.  Upon presentation to IR patient noted to have rigors with complaints of subjective fever, nausea, abdominal pain and significant weakness. His daughter is concerned that he has not been receiving complete HD during his recent sessions because the catheter was functioning poorly prior to today. She thinks the catheter was placed about 6 months ago and has never been exchanged.  Given patient's presentation upon arrival to IR concerning for acute infection vs possible uremic symptoms patient was discussed with Dr. Hollie Salk today who recommends holding on tunneled HD catheter exchange and having patient be seen in the ED for further workup.  Discussed this with patient and his daughter who are in agreement - patient brought to ED by IR RN.   Candiss Norse, PA-C

## 2021-03-28 NOTE — ED Notes (Signed)
Patient transported to CT 
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
## 2021-03-28 NOTE — ED Triage Notes (Signed)
Pt brought from IR, came in today for his dialysis catheter to be replaced, upon arrival to IR pt slightly tachy, and complaining of n/v. Pt A&Ox4.

## 2021-03-28 NOTE — Telephone Encounter (Signed)
Spoke with patient's daughter around 1415 this afternoon. Daughter is requesting that patient receive a direct admission to the hospital tonight after he receives exchange of tunneled HD catheter. Daughter reports that patient has been experiencing increased weakness, shaking, nausea, and chest pain. Daughter is concerned that patient has not received proper dialysis in over a week due to clogged catheter.   Spoke with Dr. Erin Hearing and Dr. Jeannine Kitten regarding patient. Recommended that patient be evaluated in ED and would not be able to do a direct admission.   While speaking with daughter, provider in IR advised of same ED recommendation.   Patient is currently checked into ED for further evaluation.   Talbot Grumbling, RN

## 2021-03-28 NOTE — Progress Notes (Addendum)
Pharmacy Antibiotic Note  William [name] is a 80 y.o. male admitted on 03/28/2021 with sepsis.  Pharmacy has been consulted for cefepime and vancomycin dosing.  Dialysis patient presents a few days with increased weakness, shakiness and nausea. His dialysis catheter has been clogged x1 week. Tmax 102, BP stable and mild tachycardia.  Plan: Cefepime 1g q24hr  Vanc '1500mg'$  x1 - no maintenance dosing put in since HD cath clogged and unsure on sched.  F/u HD plans, cultures, infectious symptoms    Temp (24hrs), Avg:101.5 F (38.6 C), Min:100.9 F (38.3 C), Max:102 F (38.9 C)  Recent Labs  Lab 03/25/21 1658 03/26/21 1703 03/28/21 1554 03/28/21 1638  WBC 10.0 12.9* 14.0*  --   CREATININE 9.28* 8.88* 12.03* 12.80*     Estimated Creatinine Clearance: 4.3 mL/min (A) (by C-G formula based on SCr of 12.8 mg/dL (H)).    Allergies  Allergen Reactions   Carvedilol Other (See Comments)    Makes him feel like his pelvis is "grinding" (??) Patient doesn't recall this, however    Antimicrobials this admission: Cefepime 6/17 >> Metronidazole 6/17 >> Vanc 6/17 >>  Dose adjustments this admission: NA  Microbiology results: 6/17 BCx: pend  Thank you for allowing pharmacy to be a part of this patient's care.  Joetta Manners, PharmD, [facility] Clinical Pharmacist  Please check AMION for all [facility] phone numbers After 10:00 PM, call [facility] [phone]

## 2021-03-28 NOTE — Progress Notes (Signed)
Pharmacy Antibiotic Note  William [name] is a 80 y.o. male admitted on 03/28/2021 with sepsis.  Pharmacy has been consulted for cefepime dosing.  Dialysis patient presents a few days with increased weakness, shakiness and nausea. His dialysis catheter has been clogged x1 week. Tmax 102, BP stable and mild tachycardia.  Plan: Cefepime 1g q24hr  F/u HD plans, cultures, infectious symptoms    Temp (24hrs), Avg:101.5 F (38.6 C), Min:100.9 F (38.3 C), Max:102 F (38.9 C)  Recent Labs  Lab 03/25/21 1658 03/26/21 1703  WBC 10.0 12.9*  CREATININE 9.28* 8.88*    Estimated Creatinine Clearance: 6.2 mL/min (A) (by C-G formula based on SCr of 8.88 mg/dL (H)).    Allergies  Allergen Reactions   Carvedilol Other (See Comments)    Makes him feel like his pelvis is "grinding" (??) Patient doesn't recall this, however    Antimicrobials this admission: Cefepime 6/17 >> Metronidazole 6/17 >>  Dose adjustments this admission: NA  Microbiology results: 6/17 BCx: pend  Thank you for allowing pharmacy to be a part of this patient's care.  Norina Buzzard, PharmD PGY1 Pharmacy Resident 03/28/2021 4:34 PM

## 2021-03-29 ENCOUNTER — Encounter (HOSPITAL_COMMUNITY): Payer: Self-pay | Admitting: Interventional Radiology

## 2021-03-29 ENCOUNTER — Inpatient Hospital Stay (HOSPITAL_COMMUNITY): Payer: Medicare PPO

## 2021-03-29 DIAGNOSIS — T827XXA Infection and inflammatory reaction due to other cardiac and vascular devices, implants and grafts, initial encounter: Secondary | ICD-10-CM

## 2021-03-29 DIAGNOSIS — I5022 Chronic systolic (congestive) heart failure: Secondary | ICD-10-CM

## 2021-03-29 DIAGNOSIS — E1159 Type 2 diabetes mellitus with other circulatory complications: Secondary | ICD-10-CM

## 2021-03-29 DIAGNOSIS — Z794 Long term (current) use of insulin: Secondary | ICD-10-CM

## 2021-03-29 DIAGNOSIS — I152 Hypertension secondary to endocrine disorders: Secondary | ICD-10-CM

## 2021-03-29 DIAGNOSIS — N185 Chronic kidney disease, stage 5: Secondary | ICD-10-CM

## 2021-03-29 DIAGNOSIS — R54 Age-related physical debility: Secondary | ICD-10-CM

## 2021-03-29 DIAGNOSIS — N2581 Secondary hyperparathyroidism of renal origin: Secondary | ICD-10-CM

## 2021-03-29 DIAGNOSIS — I1311 Hypertensive heart and chronic kidney disease without heart failure, with stage 5 chronic kidney disease, or end stage renal disease: Secondary | ICD-10-CM

## 2021-03-29 DIAGNOSIS — Z789 Other specified health status: Secondary | ICD-10-CM

## 2021-03-29 DIAGNOSIS — N186 End stage renal disease: Secondary | ICD-10-CM

## 2021-03-29 DIAGNOSIS — E1151 Type 2 diabetes mellitus with diabetic peripheral angiopathy without gangrene: Secondary | ICD-10-CM

## 2021-03-29 DIAGNOSIS — A419 Sepsis, unspecified organism: Secondary | ICD-10-CM

## 2021-03-29 DIAGNOSIS — Z992 Dependence on renal dialysis: Secondary | ICD-10-CM

## 2021-03-29 DIAGNOSIS — R531 Weakness: Secondary | ICD-10-CM

## 2021-03-29 HISTORY — PX: IR FLUORO GUIDE CV LINE RIGHT: IMG2283

## 2021-03-29 HISTORY — DX: Infection and inflammatory reaction due to other cardiac and vascular devices, implants and grafts, initial encounter: T82.7XXA

## 2021-03-29 HISTORY — PX: IR PTA VENOUS EXCEPT DIALYSIS CIRCUIT: IMG6126

## 2021-03-29 LAB — CBC WITH DIFFERENTIAL/PLATELET
Abs Immature Granulocytes: 0.1 10*3/uL — ABNORMAL HIGH (ref 0.00–0.07)
Basophils Absolute: 0.1 10*3/uL (ref 0.0–0.1)
Basophils Relative: 0 %
Eosinophils Absolute: 0 10*3/uL (ref 0.0–0.5)
Eosinophils Relative: 0 %
HCT: 27.2 % — ABNORMAL LOW (ref 39.0–52.0)
Hemoglobin: 8.9 g/dL — ABNORMAL LOW (ref 13.0–17.0)
Immature Granulocytes: 1 %
Lymphocytes Relative: 10 %
Lymphs Abs: 1.5 10*3/uL (ref 0.7–4.0)
MCH: 32.7 pg (ref 26.0–34.0)
MCHC: 32.7 g/dL (ref 30.0–36.0)
MCV: 100 fL (ref 80.0–100.0)
Monocytes Absolute: 1.3 10*3/uL — ABNORMAL HIGH (ref 0.1–1.0)
Monocytes Relative: 9 %
Neutro Abs: 11.6 10*3/uL — ABNORMAL HIGH (ref 1.7–7.7)
Neutrophils Relative %: 80 %
Platelets: 246 10*3/uL (ref 150–400)
RBC: 2.72 MIL/uL — ABNORMAL LOW (ref 4.22–5.81)
RDW: 15.4 % (ref 11.5–15.5)
WBC: 14.6 10*3/uL — ABNORMAL HIGH (ref 4.0–10.5)
nRBC: 0.2 % (ref 0.0–0.2)

## 2021-03-29 LAB — URINALYSIS, ROUTINE W REFLEX MICROSCOPIC
Bilirubin Urine: NEGATIVE
Glucose, UA: NEGATIVE mg/dL
Hgb urine dipstick: NEGATIVE
Ketones, ur: NEGATIVE mg/dL
Leukocytes,Ua: NEGATIVE
Nitrite: NEGATIVE
Protein, ur: 30 mg/dL — AB
Specific Gravity, Urine: 1.014 (ref 1.005–1.030)
pH: 5 (ref 5.0–8.0)

## 2021-03-29 LAB — BLOOD CULTURE ID PANEL (REFLEXED) - BCID2

## 2021-03-29 LAB — COMPREHENSIVE METABOLIC PANEL
ALT: 118 U/L — ABNORMAL HIGH (ref 0–44)
ALT: 93 U/L — ABNORMAL HIGH (ref 0–44)
AST: 41 U/L (ref 15–41)
AST: 26 U/L (ref 15–41)
Albumin: 2.5 g/dL — ABNORMAL LOW (ref 3.5–5.0)
Albumin: 2.3 g/dL — ABNORMAL LOW (ref 3.5–5.0)
Alkaline Phosphatase: 162 U/L — ABNORMAL HIGH (ref 38–126)
Alkaline Phosphatase: 152 U/L — ABNORMAL HIGH (ref 38–126)
Anion gap: 17 — ABNORMAL HIGH (ref 5–15)
Anion gap: 15 (ref 5–15)
BUN: 67 mg/dL — ABNORMAL HIGH (ref 8–23)
BUN: 74 mg/dL — ABNORMAL HIGH (ref 8–23)
CO2: 21 mmol/L — ABNORMAL LOW (ref 22–32)
CO2: 22 mmol/L (ref 22–32)
Calcium: 8.5 mg/dL — ABNORMAL LOW (ref 8.9–10.3)
Calcium: 8 mg/dL — ABNORMAL LOW (ref 8.9–10.3)
Chloride: 97 mmol/L — ABNORMAL LOW (ref 98–111)
Chloride: 94 mmol/L — ABNORMAL LOW (ref 98–111)
Creatinine, Ser: 12.05 mg/dL — ABNORMAL HIGH (ref 0.61–1.24)
Creatinine, Ser: 12.57 mg/dL — ABNORMAL HIGH (ref 0.61–1.24)
GFR, Estimated: 4 mL/min — ABNORMAL LOW (ref >60)
GFR, Estimated: 4 mL/min — ABNORMAL LOW (ref >60)
Glucose, Bld: 86 mg/dL (ref 70–99)
Glucose, Bld: 132 mg/dL — ABNORMAL HIGH (ref 70–99)
Potassium: 4.2 mmol/L (ref 3.5–5.1)
Potassium: 3.7 mmol/L (ref 3.5–5.1)
Sodium: 135 mmol/L (ref 135–145)
Sodium: 131 mmol/L — ABNORMAL LOW (ref 135–145)
Total Bilirubin: 1.4 mg/dL — ABNORMAL HIGH (ref 0.3–1.2)
Total Bilirubin: 1 mg/dL (ref 0.3–1.2)
Total Protein: 6.1 g/dL — ABNORMAL LOW (ref 6.5–8.1)
Total Protein: 5.9 g/dL — ABNORMAL LOW (ref 6.5–8.1)

## 2021-03-29 LAB — PROTIME-INR
INR: 1.3 — ABNORMAL HIGH (ref 0.8–1.2)
Prothrombin Time: 16.5 seconds — ABNORMAL HIGH (ref 11.4–15.2)

## 2021-03-29 LAB — GLUCOSE, CAPILLARY
Glucose-Capillary: 158 mg/dL — ABNORMAL HIGH (ref 70–99)
Glucose-Capillary: 87 mg/dL (ref 70–99)

## 2021-03-29 LAB — PROCALCITONIN: Procalcitonin: 3.5 ng/mL

## 2021-03-29 MED ORDER — IOHEXOL 300 MG/ML  SOLN
50.0000 mL | Freq: Once | INTRAMUSCULAR | Status: AC | PRN
  Administered 2021-03-29: 20 mL via INTRAVENOUS

## 2021-03-29 MED ORDER — FENTANYL CITRATE (PF) 100 MCG/2ML IJ SOLN
INTRAMUSCULAR | Status: AC | PRN
  Administered 2021-03-29 (×2): 50 ug via INTRAVENOUS

## 2021-03-29 MED ORDER — VANCOMYCIN HCL 750 MG/150ML IV SOLN
750.0000 mg | Freq: Once | INTRAVENOUS | Status: AC
  Administered 2021-03-29: 750 mg via INTRAVENOUS
  Filled 2021-03-29: qty 150

## 2021-03-29 MED ORDER — FENTANYL CITRATE (PF) 100 MCG/2ML IJ SOLN
INTRAMUSCULAR | Status: AC
  Filled 2021-03-29: qty 2

## 2021-03-29 MED ORDER — HEPARIN SODIUM (PORCINE) 1000 UNIT/ML IJ SOLN
INTRAMUSCULAR | Status: AC | PRN
  Administered 2021-03-29: 5.2 mL via INTRAVENOUS

## 2021-03-29 MED ORDER — HEPARIN SODIUM (PORCINE) 1000 UNIT/ML IJ SOLN
INTRAMUSCULAR | Status: AC
  Filled 2021-03-29: qty 1

## 2021-03-29 MED ORDER — HEPARIN SODIUM (PORCINE) 1000 UNIT/ML IJ SOLN
INTRAMUSCULAR | Status: AC
  Filled 2021-03-29: qty 6

## 2021-03-29 MED ORDER — LIDOCAINE HCL 1 % IJ SOLN
INTRAMUSCULAR | Status: AC
  Filled 2021-03-29: qty 20

## 2021-03-29 MED ORDER — LIDOCAINE HCL 1 % IJ SOLN
INTRAMUSCULAR | Status: AC | PRN
  Administered 2021-03-29 (×2): 10 mL

## 2021-03-29 MED ORDER — CHLORHEXIDINE GLUCONATE CLOTH 2 % EX PADS
6.0000 | MEDICATED_PAD | Freq: Every day | CUTANEOUS | Status: DC
  Administered 2021-03-30 – 2021-04-03 (×5): 6 via TOPICAL

## 2021-03-29 NOTE — Progress Notes (Signed)
FPTS Interim Progress Note Secure chat from floor nurse that patient was having jerks and appearing lethargic. I went to see patient and daughter at bedside. Daughter explained jerking motion happening when patient was falling asleep. Patient endorses he has not slept in several days and is currently sleepy. Would like to take a nap. I voiced understanding and feel as if patient has not had a change in status since seeing him in the ED. Still awaiting HD, plan to go this evening.   Gerlene Fee, DO 03/29/2021, 3:59 PM PGY-2, [facility] Service pager [phone]
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
## 2021-03-29 NOTE — ED Notes (Signed)
Pt remains in IR

## 2021-03-29 NOTE — Progress Notes (Signed)
PHARMACY - PHYSICIAN COMMUNICATION CRITICAL VALUE ALERT - BLOOD CULTURE IDENTIFICATION (BCID)  William [name] is an 80 y.o. male who presented to [facility] on 03/28/2021 with a chief complaint of sepsis  Assessment:  WBC elevated, ESRD on HD, Tmax 102  Name of physician (or Provider) Contacted: Dr. Posey Pronto (FMTS)  Current antibiotics: Vancomycin/Cefepime  Changes to prescribed antibiotics recommended:  None, consider DC Cefepime soon  Results for orders placed or performed during the hospital encounter of 03/28/21  Blood Culture ID Panel (Reflexed) (Collected: 03/28/2021  4:25 PM)  Result Value Ref Range   Enterococcus faecalis NOT DETECTED NOT DETECTED   Enterococcus Faecium NOT DETECTED NOT DETECTED   Listeria monocytogenes NOT DETECTED NOT DETECTED   Staphylococcus species DETECTED (A) NOT DETECTED   Staphylococcus aureus (BCID) NOT DETECTED NOT DETECTED   Staphylococcus epidermidis DETECTED (A) NOT DETECTED   Staphylococcus lugdunensis NOT DETECTED NOT DETECTED   Streptococcus species NOT DETECTED NOT DETECTED   Streptococcus agalactiae NOT DETECTED NOT DETECTED   Streptococcus pneumoniae NOT DETECTED NOT DETECTED   Streptococcus pyogenes NOT DETECTED NOT DETECTED   A.calcoaceticus-baumannii NOT DETECTED NOT DETECTED   Bacteroides fragilis NOT DETECTED NOT DETECTED   Enterobacterales NOT DETECTED NOT DETECTED   Enterobacter cloacae complex NOT DETECTED NOT DETECTED   Escherichia coli NOT DETECTED NOT DETECTED   Klebsiella aerogenes NOT DETECTED NOT DETECTED   Klebsiella oxytoca NOT DETECTED NOT DETECTED   Klebsiella pneumoniae NOT DETECTED NOT DETECTED   Proteus species NOT DETECTED NOT DETECTED   Salmonella species NOT DETECTED NOT DETECTED   Serratia marcescens NOT DETECTED NOT DETECTED   Haemophilus influenzae NOT DETECTED NOT DETECTED   Neisseria meningitidis NOT DETECTED NOT DETECTED   Pseudomonas aeruginosa NOT DETECTED NOT DETECTED   Stenotrophomonas maltophilia  NOT DETECTED NOT DETECTED   Candida albicans NOT DETECTED NOT DETECTED   Candida auris NOT DETECTED NOT DETECTED   Candida glabrata NOT DETECTED NOT DETECTED   Candida krusei NOT DETECTED NOT DETECTED   Candida parapsilosis NOT DETECTED NOT DETECTED   Candida tropicalis NOT DETECTED NOT DETECTED   Cryptococcus neoformans/gattii NOT DETECTED NOT DETECTED   Methicillin resistance mecA/C DETECTED (A) NOT DETECTED    Narda Bonds 03/29/2021  11:58 PM

## 2021-03-29 NOTE — ED Notes (Signed)
Attempted to call report. RN not available at this time. Extension given.

## 2021-03-29 NOTE — Progress Notes (Signed)
PT Cancellation Note  Patient Details Name: William [name] MRN: [id number] DOB: 06-21-1941   Cancelled Treatment:    Reason Eval/Treat Not Completed: Patient at procedure or test/unavailable (IR).  Wyona Almas, PT, DPT Acute Rehabilitation Services Pager [phone] Office [phone]    Deno Etienne 03/29/2021, 9:39 AM

## 2021-03-29 NOTE — ED Notes (Signed)
Patient transported to IR 

## 2021-03-29 NOTE — ED Notes (Signed)
Pt received breakfast tray 

## 2021-03-29 NOTE — Progress Notes (Signed)
IR follow up. Pt remains in ED. Chart/notes reviewed. Clinical status much improved. Possible sepsis workup ongoing but blood cultures so far negative. Afebrile. Still needs HD. BP 107/62   Pulse 61   Temp 97.8 F (36.6 C) (Oral)   Resp 19   Ht '5\' 7"'$  (1.702 m)   Wt 65.3 kg   SpO2 100%   BMI 22.55 kg/m   Pt sitting up in bed eating. Awake and alert. (R)femoral HD cath intact.  Plan to proceed with image guided exchange. Rediscussed procedure with pt. Obtained new consent.  Ascencion Dike PA-C Interventional Radiology 03/29/2021 9:16 AM

## 2021-03-29 NOTE — Progress Notes (Signed)
Family Medicine Teaching Service Daily Progress Note Intern Pager: [phone]  Patient name: William [name] Medical record number: [id number] Date of birth: [birth year] Age: 80 y.o. Gender: male  Primary Care Provider: Matilde Haymaker, MD Consultants: IR, Nephro Code Status: FULL  Pt Overview and Major Events to Date:  6/17 Admitted  Assessment and Plan: William [name] is a 80 y.o. male presenting with increased weakness and found to meet sepsis criteria . PMH is significant for ESRD on HD MWF, CAD, HTN, DM,hx multiple falls, subdural hematoma, nonsustained v-tach s/p pacemaker.  Severe sepsis  Unknown source  Weakness, s/p HD catheter exchange Denies recent illness. Afebrile overnight but continues to have soft BPs. BP most recently 103/60. UA unremarkable. Urine culture and blood cultures pending. Procal 3.5. WBC 14>14.6. AST/ALT 73/170>41/118. With negative imaging/UA, HD catheter replaced today, and no clear source will await cultures and continue to monitor for improvement.  - Monitor fever curve - Vitals per routine - F/u bld cx - F/u urine cx - Continue vancomycin and cefepime, de-escalate as cultures result - Trend AM labs (CBC/CMP) - Continue to monitor for abdominal pain - PT/OT eval and treat   ESRD on HD (MWF) Cr 12, K 4.2. Plan for HD catheter replacement today, as well as HD - IR consulted for catheter replacement - Nephrology consulted, HD today   Ascites, mild  Elevated liver enzymes, improving.  AST 73>41, ALT 170>118. CT and RUQ [place] noted ascites; gallbladder wall thickening, likely related to the ascites. Patient without abdominal pain, hepatitis panel on 5/30 was negative. Ascites on exam without fluid wave and no peritoneal signs.  - AM CMP - Monitor for abdominal pain or peritoneal signs   CAD I Hx of high degree heart block s/p Pacemaker  H/o V-tach HR and BP currently stable. Patient has paced rhythm on EKG. Home meds: Atorvastatin 40 mg daily, aspirin  81 mg, Lopressor '25mg'$  BID.   -- Cardiac telemetry -- Continue home atorvastatin, ASA, Lopressor    T2DM Last A1c 6.5 on 03/10/21  Patient not on any home medications for diabetes mellitus. Not adding sliding scale insulin at this time, unless becomes appropriate per glucose monitoring.  -- CBG monitoring QAC and QHS -- Monitor closely   Goals of care Patient previously has seen palliative care outpatient. Currently full code and has voiced he would like for it to stay that way and would like all measures to save his life. (previous documented DNR in 2020 and 2021). - Consider palliative consult for continued goals of care   FEN/GI: Renal and fluid restrict PPx: Heparin gtt  Status is: Inpatient  Remains inpatient appropriate because:Ongoing diagnostic testing needed not appropriate for outpatient work up  Dispo: The patient is from: Home              Anticipated d/c is to: Home              Patient currently is not medically stable to d/c.   Difficult to place patient No  Subjective:  Doing well. No pain at this time. Waiting to go to HD.   Objective: Temp:  [97.8 F (36.6 C)-102 F (38.9 C)] 97.8 F (36.6 C) (06/18 0343) Pulse Rate:  [60-95] 60 (06/18 0458) Resp:  [10-20] 15 (06/18 0715) BP: (92-148)/(53-84) 93/58 (06/18 0715) SpO2:  [92 %-100 %] 99 % (06/18 0458) Weight:  [65.3 kg] 65.3 kg (06/18 0345) Physical Exam: General: Lying comfortably in bed. No acute distress.  Cardiovascular: RRR. No murmurs. Respiratory:  CTAB. Normal effort.  Abdomen: Mildly protuberant. No pain to palpation. No guarding. No masses.  Extremities: Moves all extremities 5/5.  Skin: Surgical site with clean dry dressing. No grossly obvious rashes or wounds  Laboratory: Recent Labs  Lab 03/26/21 1703 03/28/21 1554 03/28/21 1638 03/29/21 0258  WBC 12.9* 14.0*  --  14.6*  HGB 9.9* 11.2* 11.9* 8.9*  HCT 31.3* 33.8* 35.0* 27.2*  PLT 264 304  --  246   Recent Labs  Lab 03/26/21 1703  03/28/21 1554 03/28/21 1638 03/29/21 0258  NA 137 134* 134* 135  K 3.8 4.3 4.2 4.2  CL 95* 93* 95* 97*  CO2 24 21*  --  21*  BUN 43* 62* 57* 67*  CREATININE 8.88* 12.03* 12.80* 12.05*  CALCIUM 8.8* 9.2  --  8.5*  PROT 7.6 7.8  --  6.1*  BILITOT 0.9 1.9*  --  1.4*  ALKPHOS 203* 234*  --  162*  ALT 229* 170*  --  118*  AST 187* 73*  --  41  GLUCOSE 95 122* 121* 86   Imaging/Diagnostic Tests: No new imaging.   Gerlene Fee, DO 03/29/2021, 7:46 AM PGY-2, [place] Intern pager: [phone], text pages welcome

## 2021-03-29 NOTE — Hospital Course (Addendum)
William [name] is a 80 y.o. male presenting with increased weakness and found to meet sepsis criteria . PMH is significant for ESRD on HD MWF, CAD, HTN, DM,hx multiple falls, subdural hematoma, nonsustained v-tach s/p pacemaker.  Severe sepsis Patient presented with weakness.  UA unremarkable. Urine culture and blood cultures obtained.  Started on Vancomycin and Cefepime.  Had negative imaging/UA.  Midordrine started due to Hypotension.  Continued Metop  Blood culture came back positive for MRSE, indicating old catheter as likely source.  Found to be sensitive to Vanc.  Vancomycin dosed by Pharmacy.  Energy level returned to baseline and WBC elevation resolved. Became hemodynamically stable.  Midodrine discontinued and Metoprolol dose changed to 12.5 mg BID. Consulted ID.  Recommended continuing Vancomycin and obtaining follow-up blood cultures.  Also obtained TTE and TEE which both were negative for endocarditis.  ID cleared patient for discharge and spoke with Renal Coordinator who set-up Hemodialysis outptient with Vanomycin until 7/4.         ESRD on HD (MWF) Nephrology consulted.  IR consulted for cath replacement.  Catheter was replaced on 6/18.  Patient able to be restarted on dialysis and tolerated without issue.     Elevated Transaminases Noted upon admission.  Has had in past also with mild ascites.  No change on liver ultrasound.  Decreased by time of discharge.

## 2021-03-29 NOTE — Procedures (Signed)
  Procedure: Exchange R fem tunneled HD CVC, PTA of IVC stenosis 44cm, 29m EBL:   minimal Complications:  none immediate  See full dictation in CBJ's  DDillard CannonMD Main # [id number]  [phone]

## 2021-03-29 NOTE — ED Notes (Signed)
Pt returned from IR.

## 2021-03-30 ENCOUNTER — Encounter (HOSPITAL_COMMUNITY): Payer: Self-pay | Admitting: Family Medicine

## 2021-03-30 DIAGNOSIS — F518 Other sleep disorders not due to a substance or known physiological condition: Secondary | ICD-10-CM

## 2021-03-30 DIAGNOSIS — I739 Peripheral vascular disease, unspecified: Secondary | ICD-10-CM

## 2021-03-30 HISTORY — DX: Other sleep disorders not due to a substance or known physiological condition: F51.8

## 2021-03-30 LAB — URINE CULTURE

## 2021-03-30 LAB — GLUCOSE, CAPILLARY
Glucose-Capillary: 125 mg/dL — ABNORMAL HIGH (ref 70–99)
Glucose-Capillary: 113 mg/dL — ABNORMAL HIGH (ref 70–99)
Glucose-Capillary: 218 mg/dL — ABNORMAL HIGH (ref 70–99)
Glucose-Capillary: 124 mg/dL — ABNORMAL HIGH (ref 70–99)

## 2021-03-30 LAB — CBC
HCT: 27.3 % — ABNORMAL LOW (ref 39.0–52.0)
Hemoglobin: 9.1 g/dL — ABNORMAL LOW (ref 13.0–17.0)
MCH: 32.6 pg (ref 26.0–34.0)
MCHC: 33.3 g/dL (ref 30.0–36.0)
MCV: 97.8 fL (ref 80.0–100.0)
Platelets: 279 10*3/uL (ref 150–400)
RBC: 2.79 MIL/uL — ABNORMAL LOW (ref 4.22–5.81)
RDW: 15.4 % (ref 11.5–15.5)
WBC: 13.3 10*3/uL — ABNORMAL HIGH (ref 4.0–10.5)
nRBC: 0.2 % (ref 0.0–0.2)

## 2021-03-30 LAB — MRSA NEXT GEN BY PCR, NASAL: MRSA by PCR Next Gen: NOT DETECTED

## 2021-03-30 MED ORDER — DARBEPOETIN ALFA 60 MCG/0.3ML IJ SOSY
60.0000 ug | PREFILLED_SYRINGE | INTRAMUSCULAR | Status: DC
  Administered 2021-03-31: 60 ug via INTRAVENOUS
  Filled 2021-03-30: qty 0.3

## 2021-03-30 MED ORDER — METOPROLOL TARTRATE 12.5 MG HALF TABLET
12.5000 mg | ORAL_TABLET | Freq: Two times a day (BID) | ORAL | Status: DC
  Administered 2021-03-30 – 2021-04-03 (×7): 12.5 mg via ORAL
  Filled 2021-03-30 (×7): qty 1

## 2021-03-30 MED ORDER — MIDODRINE HCL 5 MG PO TABS
5.0000 mg | ORAL_TABLET | Freq: Three times a day (TID) | ORAL | Status: DC
  Administered 2021-03-30 – 2021-04-03 (×11): 5 mg via ORAL
  Filled 2021-03-30 (×10): qty 1

## 2021-03-30 MED ORDER — MIDODRINE HCL 5 MG PO TABS: 5.0000 mg | ORAL_TABLET | Freq: Three times a day (TID) | ORAL | Status: DC

## 2021-03-30 NOTE — Progress Notes (Addendum)
Family Medicine Teaching Service Daily Progress Note Intern Pager: [phone]  Patient name: William [name] Medical record number: [id number] Date of birth: 1941-05-29 Age: 80 y.o. Gender: male  Primary Care Provider: Matilde Haymaker, MD Consultants: IR, Nephro Code Status: FULL  Pt Overview and Major Events to Date:  6/17 Admitted  Assessment and Plan: William [name] is a 80 y.o. male presenting with increased weakness and found to meet sepsis criteria . PMH is significant for ESRD on HD MWF, CAD, HTN, DM,hx multiple falls, subdural hematoma, nonsustained v-tach s/p pacemaker.  Severe sepsis  Unknown source  Weakness, s/p HD catheter exchange Denies recent illness. Afebrile overnight but continues to have soft BPs. BP most recently 103/60. UA unremarkable. Urine culture and blood cultures pending. Procal 3.5. WBC 14>14.6. AST/ALT 73/170>41/118. With negative imaging/UA, HD catheter replaced today, and no clear source will await cultures and continue to monitor for improvement.  - Monitor fever curve - Vitals per routine - F/u bld cx - F/u urine cx - Continue vancomycin and cefepime, de-escalate as cultures result - Trend AM labs (CBC/CMP) - Continue to monitor for abdominal pain - PT/OT eval and treat   ESRD on HD (MWF) Cr 12, K 4.2. Plan for HD catheter replacement today, as well as HD - IR consulted for catheter replacement - Nephrology consulted, HD today   Ascites, mild  Elevated liver enzymes, improving.  AST 73>41, ALT 170>118. CT and RUQ [place] noted ascites; gallbladder wall thickening, likely related to the ascites. Patient without abdominal pain, hepatitis panel on 5/30 was negative. Ascites on exam without fluid wave and no peritoneal signs.  - AM CMP - Monitor for abdominal pain or peritoneal signs   CAD I Hx of high degree heart block s/p Pacemaker  H/o V-tach HR and BP currently stable. Patient has paced rhythm on EKG. Home meds: Atorvastatin 40 mg daily, aspirin  81 mg, Lopressor '25mg'$  BID.   -- Cardiac telemetry -- Continue home atorvastatin, ASA, Lopressor    T2DM Last A1c 6.5 on 03/10/21  Patient not on any home medications for diabetes mellitus. Not adding sliding scale insulin at this time, unless becomes appropriate per glucose monitoring.  -- CBG monitoring QAC and QHS -- Monitor closely   Goals of care Follows with palliative care outpatient. Previously DNR but current and previous admission elected Full code.  - Outpatient palliative care for continued goals of care   FEN/GI: Renal and fluid restrict PPx: Heparin gtt  Status is: Inpatient  Remains inpatient appropriate because:Ongoing diagnostic testing needed not appropriate for outpatient work up  Dispo: The patient is from: Home              Anticipated d/c is to: Home              Patient currently is not medically stable to d/c.   Difficult to place patient No  Subjective:  Patient reports he is not feeling well after dialysis last night.   Objective: Temp:  [97.7 F (36.5 C)-99.1 F (37.3 C)] 98.8 F (37.1 C) (06/19 0739) Pulse Rate:  [60-88] 83 (06/19 0739) Resp:  [11-24] 16 (06/19 0739) BP: (81-139)/(45-73) 83/45 (06/19 0739) SpO2:  [95 %-100 %] 100 % (06/19 0739) Weight:  [67 kg-70.1 kg] 70.1 kg (06/19 0500)  Physical Exam: GEN: pleasant elderly male CV: regular rate and rhythm, right thigh w/ TDC  RESP: no increased work of breathing, clear to ascultation bilaterally with no crackles, wheezes, or rhonchi  ABD: Bowel sounds present.  Soft, Nontender, Nondistended.  MSK: no LE edema, tender to touch with light palpation  SKIN: warm, dry    Laboratory: Recent Labs  Lab 03/28/21 1554 03/28/21 1638 03/29/21 0258 03/30/21 0104  WBC 14.0*  --  14.6* 13.3*  HGB 11.2* 11.9* 8.9* 9.1*  HCT 33.8* 35.0* 27.2* 27.3*  PLT 304  --  246 279    Recent Labs  Lab 03/28/21 1554 03/28/21 1638 03/29/21 0258 03/29/21 2010  NA 134* 134* 135 131*  K 4.3 4.2 4.2 3.7   CL 93* 95* 97* 94*  CO2 21*  --  21* 22  BUN 62* 57* 67* 74*  CREATININE 12.03* 12.80* 12.05* 12.57*  CALCIUM 9.2  --  8.5* 8.0*  PROT 7.8  --  6.1* 5.9*  BILITOT 1.9*  --  1.4* 1.0  ALKPHOS 234*  --  162* 152*  ALT 170*  --  118* 93*  AST 73*  --  41 26  GLUCOSE 122* 121* 86 132*    Imaging/Diagnostic Tests: No results found.    Lyndee Hensen, DO 03/30/2021, 11:44 AM PGY-2, [place] Intern pager: [phone], text pages welcome

## 2021-03-30 NOTE — Evaluation (Addendum)
Physical Therapy Evaluation Patient Details Name: William [name] MRN: [id number] DOB: Apr 17, 1941 Today's Date: 03/30/2021   History of Present Illness  Pt is a 80 year old man admitted with N/V/D, generalized weakness and difficulty taking care of himself. He lives with his daughter and her children and walks with a cane at baseline. PMH: ESRD, PAD, PPM, anemia, sarcoidosis, DM2, CHF, HTN, CAD, SDU with craniotomy.  Clinical Impression  PTA, pt is a limited household ambulator using a cane. Pt presents with decreased functional mobility secondary to generalized weakness, decreased cognition, and decreased endurance. Pt requiring two person assist to stand and able to take multidirectional steps using a walker at a min assist level. Pt deferring transfer to chair or further mobility at this time. Suspect pt will progress well based on PLOF; seems to be a component of decreased initiation this session which resulted in more assist. Pt daughter present and reports they can likely provide amount of assist or supervision he may need. Will continue to follow acutely to progress mobility as tolerated.     Follow Up Recommendations Home health PT;Supervision/Assistance - 24 hour ([place] aide)    Equipment Recommendations  3in1 (PT)    Recommendations for Other Services       Precautions / Restrictions Precautions Precautions: Fall;Other (comment) Precaution Comments: R femoral tunneled catheter Restrictions Weight Bearing Restrictions: No      Mobility  Bed Mobility Overal bed mobility: Needs Assistance Bed Mobility: Supine to Sit;Sit to Supine     Supine to sit: Mod assist Sit to supine: Mod assist   General bed mobility comments: assist for initiation, trunk to upright. Brought BLE's back up into bed upon return    Transfers Overall transfer level: Needs assistance Equipment used: Rolling walker (2 wheeled) Transfers: Sit to/from Stand Sit to Stand: Mod assist;+2 physical  assistance         General transfer comment: ModA to rise from edge of bed, decreased initiation by pt, requiring increased time/effort.  Ambulation/Gait Ambulation/Gait assistance: Min assist Gait Distance (Feet): 3 Feet Assistive device: Rolling walker (2 wheeled) Gait Pattern/deviations: Step-through pattern;Trunk flexed;Decreased stride length Gait velocity: decreased   General Gait Details: Pt taking several steps forwards, backwards and laterally with minA. Slow and cautious gait  Stairs            Wheelchair Mobility    Modified Rankin (Stroke Patients Only)       Balance Overall balance assessment: Needs assistance Sitting-balance support: Feet supported Sitting balance-Leahy Scale: Good     Standing balance support: Bilateral upper extremity supported Standing balance-Leahy Scale: Poor                               Pertinent Vitals/Pain Pain Assessment: No/denies pain    Home Living Family/patient expects to be discharged to:: Private residence Living Arrangements: Children Available Help at Discharge: Family;Available 24 hours/day Type of Home: House Home Access: Stairs to enter Entrance Stairs-Rails: Left Entrance Stairs-Number of Steps: 3 Home Layout: Multi-level;Able to live on main level with bedroom/bathroom Home Equipment: Kasandra Knudsen - single point;Walker - 2 wheels;Wheelchair - manual      Prior Function Level of Independence: Independent with assistive device(s)         Comments: uses a cane, sponge bathes, helps daughter with with meals and housekeeping; transportation required; daughter from [place], goes back home and daughter in town has 3 young children and may be difiicult to assist  Hand Dominance   Dominant Hand: Right    Extremity/Trunk Assessment   Upper Extremity Assessment Upper Extremity Assessment: Defer to OT evaluation    Lower Extremity Assessment Lower Extremity Assessment: Generalized  weakness    Cervical / Trunk Assessment Cervical / Trunk Assessment: Kyphotic  Communication   Communication: No difficulties  Cognition Arousal/Alertness: Awake/alert Behavior During Therapy: WFL for tasks assessed/performed Overall Cognitive Status: Impaired/Different from baseline Area of Impairment: Problem solving;Memory                     Memory: Decreased short-term memory       Problem Solving: Slow processing;Decreased initiation General Comments: Slow to respond      General Comments      Exercises     Assessment/Plan    PT Assessment Patient needs continued PT services  PT Problem List Decreased balance;Decreased strength       PT Treatment Interventions DME instruction;Gait training;Stair training;Functional mobility training;Therapeutic activities;Therapeutic exercise;Balance training;Cognitive remediation;Patient/family education    PT Goals (Current goals can be found in the Care Plan section)  Acute Rehab PT Goals Patient Stated Goal: return home PT Goal Formulation: With patient/family Time For Goal Achievement: 04/13/21 Potential to Achieve Goals: Fair    Frequency Min 3X/week   Barriers to discharge        Co-evaluation PT/OT/SLP Co-Evaluation/Treatment: Yes Reason for Co-Treatment: To address functional/ADL transfers;For patient/therapist safety PT goals addressed during session: Mobility/safety with mobility         AM-PAC PT "6 Clicks" Mobility  Outcome Measure Help needed turning from your back to your side while in a flat bed without using bedrails?: A Little Help needed moving from lying on your back to sitting on the side of a flat bed without using bedrails?: A Lot Help needed moving to and from a bed to a chair (including a wheelchair)?: A Little Help needed standing up from a chair using your arms (e.g., wheelchair or bedside chair)?: A Lot Help needed to walk in hospital room?: A Little Help needed climbing 3-5  steps with a railing? : A Lot 6 Click Score: 15    End of Session Equipment Utilized During Treatment: Gait belt Activity Tolerance: Patient limited by fatigue Patient left: in bed;with call bell/phone within reach;with bed alarm set Nurse Communication: Mobility status PT Visit Diagnosis: Muscle weakness (generalized) (M62.81)    Time: 1350-1436 PT Time Calculation (min) (ACUTE ONLY): 46 min   Charges:   PT Evaluation $PT Eval Moderate Complexity: 1 Mod PT Treatments $Therapeutic Activity: 8-22 mins        Wyona Almas, PT, DPT Acute Rehabilitation Services Pager [phone] Office [phone]   Deno Etienne 03/30/2021, 4:07 PM

## 2021-03-30 NOTE — Progress Notes (Signed)
[facility] Progress Note   Subjective:  Seen in room - sleepy but otherwise ok. No CP or dyspnea. Was dialyzed overnight last night - TDC ran fine per notes, but unable to pull fluid off him d/t hypotension. Looks like BCx are + for MRSE.  Objective Vitals:   03/30/21 0016 03/30/21 0500 03/30/21 0615 03/30/21 0739  BP: (!) 88/51  (!) 91/56 (!) 83/45  Pulse: 88  86 83  Resp: '19  18 16  '$ Temp: 99 F (37.2 C)  98 F (36.7 C) 98.8 F (37.1 C)  TempSrc:   Oral Oral  SpO2: 96%  99% 100%  Weight:  70.1 kg    Height:       Physical Exam General: Well appearing man, NAD Heart: RRR; no murmur. One area of TTP in L chest Lungs: CTAB Abdomen: soft, non-tender Extremities: No LE edema Dialysis Access: R femoral [facility]  Additional Objective Labs: Basic Metabolic Panel: Recent Labs  Lab 03/28/21 1554 03/28/21 1638 03/29/21 0258 03/29/21 2010  NA 134* 134* 135 131*  K 4.3 4.2 4.2 3.7  CL 93* 95* 97* 94*  CO2 21*  --  21* 22  GLUCOSE 122* 121* 86 132*  BUN 62* 57* 67* 74*  CREATININE 12.03* 12.80* 12.05* 12.57*  CALCIUM 9.2  --  8.5* 8.0*   Liver Function Tests: Recent Labs  Lab 03/28/21 1554 03/29/21 0258 03/29/21 2010  AST 73* 41 26  ALT 170* 118* 93*  ALKPHOS 234* 162* 152*  BILITOT 1.9* 1.4* 1.0  PROT 7.8 6.1* 5.9*  ALBUMIN 3.3* 2.5* 2.3*   Recent Labs  Lab 03/28/21 1554  LIPASE 61*   CBC: Recent Labs  Lab 03/25/21 1658 03/26/21 1703 03/26/21 1703 03/28/21 1554 03/28/21 1638 03/29/21 0258 03/30/21 0104  WBC 10.0 12.9*   < > 14.0*  --  14.6* 13.3*  NEUTROABS  --  10.2*  --  12.6*  --  11.6*  --   HGB 9.4* 9.9*   < > 11.2* 11.9* 8.9* 9.1*  HCT 28.3* 31.3*   < > 33.8* 35.0* 27.2* 27.3*  MCV 96 101.6*  --  99.1  --  100.0 97.8  PLT 289 264   < > 304  --  246 279   < > = values in this interval not displayed.    Studies/Results: CT ABDOMEN PELVIS WO CONTRAST  Result Date: 03/28/2021 CLINICAL DATA:  Lower abdominal pain EXAM: CT ABDOMEN  AND PELVIS WITHOUT CONTRAST TECHNIQUE: Multidetector CT imaging of the abdomen and pelvis was performed following the standard protocol without IV contrast. COMPARISON:  03/10/21 FINDINGS: Lower chest: Mild atelectatic changes are noted in the right lower lobe. No confluent infiltrate is seen. Hepatobiliary: Liver is unremarkable. Gallbladder is decompressed. Pericholecystic inflammatory changes are noted likely related to the underlying ascites given the relative decompression of the gallbladder Pancreas: Unremarkable. No pancreatic ductal dilatation or surrounding inflammatory changes. Spleen: Normal in size without focal abnormality. Adrenals/Urinary Tract: Adrenal glands are within normal limits. Renal vascular calcifications are seen. No renal calculi or obstructive changes are noted. The bladder is decompressed. Stomach/Bowel: Scattered diverticular change of the colon is noted without evidence of diverticulitis. The appendix is well visualized and within normal limits. Small bowel and stomach are unremarkable. Vascular/Lymphatic: Aortic atherosclerosis. No enlarged abdominal or pelvic lymph nodes. Right femoral dialysis catheter is noted extending to the cavoatrial junction. Reproductive: Prostate is unremarkable. Other: Mild ascites is noted. This is slightly increased when compared with the prior study. Musculoskeletal: Degenerative  changes of lumbar spine are noted. IMPRESSION: Mild ascites with some associated inflammatory changes of the gallbladder which are reactive in nature. This is similar to that seen on prior CT examination. The gallbladder is decompressed at this time however. Diverticulosis without evidence of diverticulitis. Normal-appearing appendix. No acute abnormality is noted. Electronically Signed   By: Inez Catalina M.D.   On: 03/28/2021 16:50   IR Fluoro Guide CV Line Right  Result Date: 03/29/2021 Arne Cleveland, MD     03/29/2021 11:36 AM Procedure: Exchange R fem tunneled HD CVC,  PTA of IVC stenosis 44cm, 18m EBL:   minimal Complications:  none immediate See full dictation in CBJ's DDillard CannonMD Main # [id number] View  Addendum Date: 03/28/2021   ADDENDUM REPORT: 03/28/2021 19:09 ADDENDUM: Examination was interpreted by Dr. CCarlis Abbott Addendum is created by Dr. SJoelyn Oms This addendum is created to correct a voice recognition error in the impression. The impression should read as follows: IMPRESSION: No acute chest findings. Electronically Signed   By: MKeith RakeM.D.   On: 03/28/2021 19:09   Result Date: 03/28/2021 CLINICAL DATA:  Question sepsis. EXAM: PORTABLE CHEST 1 VIEW COMPARISON:  03/26/2021 FINDINGS: Lungs are clear without infiltrate or effusion. Dual lead pacemaker unchanged. Vascular catheter in the inferior vena cava extending of the right atrium unchanged. Stent in the right axilla. Heart size and vascularity normal. IMPRESSION: One or Electronically Signed: By: CFranchot GalloM.D. On: 03/28/2021 16:59   IR PTA VENOUS EXCEPT DIALYSIS CIRCUIT  Result Date: 03/29/2021 HArne Cleveland MD     03/29/2021 11:36 AM Procedure: Exchange R fem tunneled HD CVC, PTA of IVC stenosis 44cm, 161mEBL:   minimal Complications:  none immediate See full dictation in CaBJ'sD.Dillard CannonD Main # [id number]  [phone] USKoreabdomen Limited RUQ (LIVER/GB)  Result Date: 03/28/2021 CLINICAL DATA:  Mild ascites and gallbladder wall thickening on recent CT EXAM: ULTRASOUND ABDOMEN LIMITED RIGHT UPPER QUADRANT COMPARISON:  CT from earlier in the same day, ultrasound from 03/10/2021 FINDINGS: Gallbladder: Gallbladder is decompressed similar to that seen on prior CT examination. Additionally the gallbladder wall thickening the 6 mm is noted with a negative sonographic Murphy's sign. These changes are consistent with the underlying ascites and similar to that seen on prior exam. Common bile duct: Diameter: 3.6 mm.  Liver: No focal lesion identified. Within normal limits in parenchymal echogenicity. Portal vein is patent on color Doppler imaging with normal direction of blood flow towards the liver. Other: Mild ascites is noted. IMPRESSION: Mild ascites similar to that seen on prior CT examination. Decompressed gallbladder with wall thickening likely related to the underlying ascites. These changes are similar to that seen on recent CT as well as on prior ultrasound. No acute abnormality noted. Electronically Signed   By: MaInez Catalina.D.   On: 03/28/2021 18:46    Medications:   aspirin  81 mg Oral Daily   atorvastatin  40 mg Oral Daily   Chlorhexidine Gluconate Cloth  6 each Topical Q0600   heparin  5,000 Units Subcutaneous Q8H   heparin sodium (porcine)       metoprolol tartrate  25 mg Oral BID   midodrine  5 mg Oral TID WC   vancomycin variable dose per unstable renal function (pharmacist dosing)   Does not apply See admin instructions    Dialysis Orders: MWF  at [facility] 4hr, 400/A1.5, EDW 60kg, 2K/2Ca, UFP #4, TDC, heparin 4000 - Venofer '50mg'$  IV weekly - Calcitriol 1.15mg PO q HD   Assessment/Plan:  Sepsis/fever/weakness: Blood Cx growing MRSE. On Vanc/Cefepime.  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 On abx at time of exchange - hopefully bacteremia will clear quickly.  ESRD:  Usual MWF schedule - next HD scheduled for tomorrow (6/20).  Hypotension/volume: Up per weights, but euvolemic exam. Unable to get any fluid off with last HD. BP low today - on mido and metoprolol. Looks like metoprolol added during last admit (amio was stopped d/t concern ^ LFTs). Pretty decent dose of the metop -> reduce to 12.5 BID for now.   Anemia: Hgb 8.9, 9.1 today - large drop from admit. Denies GI losses. Will start Aranesp with next HD.  Metabolic bone disease: Ca ok, Phos pending. Resume home meds/binders.  Nutrition:  Alb low, adding protein supplements.  T2DM  CAD, Hx VT,  pacemaker  Transaminitis: Slow improvement over past few days  KVeneta Penton PA-C 03/30/2021, 9:47 AM  [employer]

## 2021-03-30 NOTE — Progress Notes (Signed)
Pts BP 83/45; asymptomatic; pt states this is his baseline. Resident on call paged; new orders placed for Midodrine. Will continue to monitor.

## 2021-03-30 NOTE — Evaluation (Signed)
Occupational Therapy Evaluation Patient Details Name: William [name] MRN: [id number] DOB: 02-17-1941 Today's Date: 03/30/2021    History of Present Illness Pt is a 80 year old man admitted with N/V/D, generalized weakness and difficulty taking care of himself. He lives with his daughter and her children and walks with a cane at baseline. PMH: ESRD, PAD, PPM, anemia, sarcoidosis, DM2, CHF, HTN, CAD, SDU with craniotomy.   Clinical Impression   Pt PTA: Pt living with family and independent with ADL, assist with iADL, walking short distances. Pt currently, limited by anxiety of moving for first time s/p procedure, decreased strength and decreased activity tolerance. Pt's daughter in room for session and very motivating to pt. Pt set-upA to maxA for ADL and modA +2 for mobility taking a few steps forward, backward and to L with RW and cues. Pt would benefit from continued OT skilled services. OT following acutely.     Follow Up Recommendations  Home health OT;Supervision/Assistance - 24 hour    Equipment Recommendations  3 in 1 bedside commode    Recommendations for Other Services       Precautions / Restrictions Precautions Precautions: Fall;Other (comment) Precaution Comments: R femoral tunneled catheter Restrictions Weight Bearing Restrictions: No      Mobility Bed Mobility Overal bed mobility: Needs Assistance Bed Mobility: Supine to Sit;Sit to Supine     Supine to sit: Mod assist Sit to supine: Mod assist   General bed mobility comments: assist for initiation, trunk to upright. Brought BLE's back up into bed upon return    Transfers Overall transfer level: Needs assistance Equipment used: Rolling walker (2 wheeled) Transfers: Sit to/from Stand Sit to Stand: Mod assist;+2 physical assistance         General transfer comment: arrived sitting EOB with PT; modA +2 for momentum and for sit to stands; cues for hand placement, decreased initiation.    Balance  Overall balance assessment: Needs assistance Sitting-balance support: Feet supported Sitting balance-Leahy Scale: Good     Standing balance support: Bilateral upper extremity supported Standing balance-Leahy Scale: Poor                             ADL either performed or assessed with clinical judgement   ADL Overall ADL's : Needs assistance/impaired Eating/Feeding: Independent   Grooming: Set up;Sitting   Upper Body Bathing: Set up;Sitting   Lower Body Bathing: Moderate assistance;Sitting/lateral leans;Sit to/from stand   Upper Body Dressing : Minimal assistance;Sitting   Lower Body Dressing: Maximal assistance;Sit to/from stand   Toilet Transfer: Moderate assistance;+2 for physical assistance;+2 for safety/equipment;Stand-pivot   Toileting- Clothing Manipulation and Hygiene: Maximal assistance;+2 for physical assistance;Sit to/from stand       Functional mobility during ADLs: Moderate assistance;+2 for physical assistance;Rolling walker;Cueing for safety;Cueing for sequencing General ADL Comments: Pt limited by self limiting anticipatory thoughts, decreased strength and decreased activity tolerance. Pt's daughter in room for session and very motivating to pt.     Vision Baseline Vision/History: Wears glasses Wears Glasses: At all times Patient Visual Report: No change from baseline Vision Assessment?: No apparent visual deficits     Perception     Praxis      Pertinent Vitals/Pain Pain Assessment: No/denies pain     Hand Dominance Right   Extremity/Trunk Assessment Upper Extremity Assessment Upper Extremity Assessment: Generalized weakness   Lower Extremity Assessment Lower Extremity Assessment: Generalized weakness;Defer to PT evaluation   Cervical / Trunk Assessment Cervical /  Trunk Assessment: Kyphotic   Communication Communication Communication: No difficulties   Cognition Arousal/Alertness: Awake/alert Behavior During Therapy: WFL for  tasks assessed/performed Overall Cognitive Status: Impaired/Different from baseline Area of Impairment: Problem solving;Memory                     Memory: Decreased short-term memory       Problem Solving: Slow processing;Decreased initiation General Comments: Slow to respond; very anxious   General Comments  VSS.    Exercises     Shoulder Instructions      Home Living Family/patient expects to be discharged to:: Private residence Living Arrangements: Children Available Help at Discharge: Family;Available 24 hours/day Type of Home: House Home Access: Stairs to enter [employer] of Steps: 3 Entrance Stairs-Rails: Left Home Layout: Multi-level;Able to live on main level with bedroom/bathroom     Bathroom Shower/Tub: Other (comment) (sponge bathes)   Bathroom Toilet: Handicapped height     Home Equipment: [place] - single point;Walker - 2 wheels;Wheelchair - manual          Prior Functioning/Environment Level of Independence: Independent with assistive device(s)        Comments: uses a cane, sponge bathes, helps daughter with with meals and housekeeping; transportation required; daughter from [place], goes back home and daughter in town has 3 young children and may be difiicult to assist        OT Problem List: Decreased strength;Decreased activity tolerance;Impaired balance (sitting and/or standing);Decreased safety awareness;Pain;Cardiopulmonary status limiting activity;Increased edema      OT Treatment/Interventions: Self-care/ADL training;DME and/or AE instruction;Therapeutic activities;Patient/family education;Balance training;Therapeutic exercise    OT Goals(Current goals can be found in the care plan section) Acute Rehab OT Goals Patient Stated Goal: return home OT Goal Formulation: With patient Time For Goal Achievement: 04/13/21 Potential to Achieve Goals: Good ADL Goals Pt Will Perform Grooming: with min guard assist;standing Pt  Will Perform Lower Body Dressing: with min assist;sit to/from stand;with adaptive equipment Pt Will Transfer to Toilet: with min guard assist;stand pivot transfer;bedside commode Additional ADL Goal #1: Pt will increase to minguardA for bed mobility as precursor for OOB ADL. Additional ADL Goal #2: Pt will increase to standing x4 mins for OOB ADL with 1 seated rest break.  OT Frequency: Min 2X/week   Barriers to D/C:            Co-evaluation PT/OT/SLP Co-Evaluation/Treatment: Yes Reason for Co-Treatment: To address functional/ADL transfers   OT goals addressed during session: ADL's and self-care;Strengthening/ROM      AM-PAC OT "6 Clicks" Daily Activity     Outcome Measure Help from another person eating meals?: None Help from another person taking care of personal grooming?: A Little Help from another person toileting, which includes using toliet, bedpan, or urinal?: A Lot Help from another person bathing (including washing, rinsing, drying)?: A Lot Help from another person to put on and taking off regular upper body clothing?: A Little Help from another person to put on and taking off regular lower body clothing?: A Lot 6 Click Score: 16   End of Session Equipment Utilized During Treatment: Gait belt;Rolling walker Nurse Communication: Mobility status  Activity Tolerance: Patient limited by pain Patient left: in bed;with call bell/phone within reach;with bed alarm set  OT Visit Diagnosis: Unsteadiness on feet (R26.81);Muscle weakness (generalized) (M62.81);Pain Pain - part of body:  (generalized)                Time: 1410-1440 OT Time Calculation (min): 30 min Charges:  OT General Charges $OT Visit: 1 Visit OT Evaluation $OT Eval Moderate Complexity: 1 Mod  Jefferey Pica, OTR/L Acute Rehabilitation Services Pager: [phone] Office: [place] 03/30/2021, 7:54 PM

## 2021-03-31 DIAGNOSIS — T827XXA Infection and inflammatory reaction due to other cardiac and vascular devices, implants and grafts, initial encounter: Principal | ICD-10-CM

## 2021-03-31 DIAGNOSIS — T827XXD Infection and inflammatory reaction due to other cardiac and vascular devices, implants and grafts, subsequent encounter: Secondary | ICD-10-CM

## 2021-03-31 DIAGNOSIS — A408 Other streptococcal sepsis: Secondary | ICD-10-CM

## 2021-03-31 LAB — COMPREHENSIVE METABOLIC PANEL
ALT: 65 U/L — ABNORMAL HIGH (ref 0–44)
AST: 18 U/L (ref 15–41)
Albumin: 2.2 g/dL — ABNORMAL LOW (ref 3.5–5.0)
Alkaline Phosphatase: 124 U/L (ref 38–126)
Anion gap: 15 (ref 5–15)
BUN: 46 mg/dL — ABNORMAL HIGH (ref 8–23)
CO2: 23 mmol/L (ref 22–32)
Calcium: 8.2 mg/dL — ABNORMAL LOW (ref 8.9–10.3)
Chloride: 96 mmol/L — ABNORMAL LOW (ref 98–111)
Creatinine, Ser: 9.68 mg/dL — ABNORMAL HIGH (ref 0.61–1.24)
GFR, Estimated: 5 mL/min — ABNORMAL LOW (ref >60)
Glucose, Bld: 137 mg/dL — ABNORMAL HIGH (ref 70–99)
Potassium: 3.7 mmol/L (ref 3.5–5.1)
Sodium: 134 mmol/L — ABNORMAL LOW (ref 135–145)
Total Bilirubin: 1 mg/dL (ref 0.3–1.2)
Total Protein: 5.6 g/dL — ABNORMAL LOW (ref 6.5–8.1)

## 2021-03-31 LAB — CBC
HCT: 26.1 % — ABNORMAL LOW (ref 39.0–52.0)
Hemoglobin: 8.4 g/dL — ABNORMAL LOW (ref 13.0–17.0)
MCH: 31.7 pg (ref 26.0–34.0)
MCHC: 32.2 g/dL (ref 30.0–36.0)
MCV: 98.5 fL (ref 80.0–100.0)
Platelets: 278 10*3/uL (ref 150–400)
RBC: 2.65 MIL/uL — ABNORMAL LOW (ref 4.22–5.81)
RDW: 15.7 % — ABNORMAL HIGH (ref 11.5–15.5)
WBC: 9.5 10*3/uL (ref 4.0–10.5)
nRBC: 0.2 % (ref 0.0–0.2)

## 2021-03-31 LAB — GLUCOSE, CAPILLARY
Glucose-Capillary: 126 mg/dL — ABNORMAL HIGH (ref 70–99)
Glucose-Capillary: 93 mg/dL (ref 70–99)
Glucose-Capillary: 73 mg/dL (ref 70–99)
Glucose-Capillary: 130 mg/dL — ABNORMAL HIGH (ref 70–99)
Glucose-Capillary: 142 mg/dL — ABNORMAL HIGH (ref 70–99)

## 2021-03-31 LAB — CULTURE, BLOOD (ROUTINE X 2): Special Requests: ADEQUATE

## 2021-03-31 MED ORDER — PENTAFLUOROPROP-TETRAFLUOROETH EX AERO: 1.0000 "application " | INHALATION_SPRAY | CUTANEOUS | Status: DC | PRN

## 2021-03-31 MED ORDER — RENA-VITE PO TABS
1.0000 | ORAL_TABLET | Freq: Every day | ORAL | Status: DC
  Administered 2021-03-31 – 2021-04-02 (×3): 1 via ORAL
  Filled 2021-03-31 (×3): qty 1

## 2021-03-31 MED ORDER — PROSOURCE PLUS PO LIQD
30.0000 mL | Freq: Two times a day (BID) | ORAL | Status: DC
  Filled 2021-03-31 (×2): qty 30

## 2021-03-31 MED ORDER — LIDOCAINE-PRILOCAINE 2.5-2.5 % EX CREA: 1.0000 "application " | TOPICAL_CREAM | CUTANEOUS | Status: DC | PRN

## 2021-03-31 MED ORDER — HEPARIN SODIUM (PORCINE) 1000 UNIT/ML IJ SOLN
INTRAMUSCULAR | Status: AC
  Administered 2021-03-31: 1000 [IU] via INTRAVENOUS_CENTRAL
  Filled 2021-03-31: qty 6

## 2021-03-31 MED ORDER — HEPARIN SODIUM (PORCINE) 1000 UNIT/ML DIALYSIS: 1000.0000 [IU] | INTRAMUSCULAR | Status: DC | PRN

## 2021-03-31 MED ORDER — SODIUM CHLORIDE 0.9 % IV SOLN: 100.0000 mL | INTRAVENOUS | Status: DC | PRN

## 2021-03-31 MED ORDER — MIDODRINE HCL 5 MG PO TABS
ORAL_TABLET | ORAL | Status: AC
  Filled 2021-03-31: qty 2

## 2021-03-31 MED ORDER — ALTEPLASE 2 MG IJ SOLR: 2.0000 mg | Freq: Once | INTRAMUSCULAR | Status: DC | PRN

## 2021-03-31 MED ORDER — HEPARIN SODIUM (PORCINE) 1000 UNIT/ML DIALYSIS
20.0000 [IU]/kg | INTRAMUSCULAR | Status: DC | PRN
  Administered 2021-03-31: 1400 [IU] via INTRAVENOUS_CENTRAL

## 2021-03-31 MED ORDER — DARBEPOETIN ALFA 60 MCG/0.3ML IJ SOSY
PREFILLED_SYRINGE | INTRAMUSCULAR | Status: AC
  Filled 2021-03-31: qty 0.3

## 2021-03-31 MED ORDER — LIDOCAINE HCL (PF) 1 % IJ SOLN: 5.0000 mL | INTRAMUSCULAR | Status: DC | PRN

## 2021-03-31 NOTE — Progress Notes (Addendum)
Family Medicine Teaching Service Daily Progress Note Intern Pager: [phone]  Patient name: William [name] Medical record number: [id number] Date of birth: 19-Jan-1941 Age: 80 y.o. Gender: male  Primary Care Provider: Matilde Haymaker, MD Consultants: IR, Nephro, ID Code Status: Full  Pt Overview and Major Events to Date:  6/17 Admitted  Assessment and Plan: YONEL MALDEN is a 80 y.o. male presenting with increased weakness and found to meet sepsis criteria . PMH is significant for ESRD on HD MWF, CAD, HTN, DM,hx multiple falls, subdural hematoma, nonsustained v-tach s/p pacemaker.  Severe Sepsis  Patient afebrile and hemodynamically stable.  Good response to Vanc.  MRSE with susceptibilities pending.  Given SE, Catheter as likely source.  Plan to continue 5 days IV antibiotic treatment.   - Continue Vancomycin, dosing per Pharmacy - follow up sensitivities - Consult ID regarding treatment/need for TTE/TEE  ESRD on HD (MWF) Currently receiving dialysis today. - Nephro following, appreciate recs   FEN/GI: Carb modified diet PPx: SubQ Heparin   Status is: Inpatient  Remains inpatient appropriate because:IV treatments appropriate due to intensity of illness or inability to take PO  Dispo: The patient is from: Home              Anticipated d/c is to: Home              Patient currently is not medically stable to d/c.   Difficult to place patient No    Subjective:  Patient indicates he is feeling better this AM.    Objective: Temp:  [98.2 F (36.8 C)-98.8 F (37.1 C)] 98.2 F (36.8 C) (06/20 0723) Pulse Rate:  [64-74] 64 (06/20 0723) Resp:  [13-17] 16 (06/20 0723) BP: (88-121)/(50-80) 116/67 (06/20 0723) SpO2:  [98 %-100 %] 100 % (06/20 0723) Weight:  [70.5 kg] 70.5 kg (06/20 0421) Physical Exam:  Physical Exam Constitutional:      Appearance: Normal appearance.     Comments: Patient resting comfortably receiving dialysis  HENT:     Head: Normocephalic and  atraumatic.     Mouth/Throat:     Mouth: Mucous membranes are moist.  Cardiovascular:     Rate and Rhythm: Normal rate and regular rhythm.  Pulmonary:     Effort: Pulmonary effort is normal.     Breath sounds: Normal breath sounds.  Skin:    General: Skin is warm.  Neurological:     General: No focal deficit present.     Mental Status: He is alert.     Laboratory: Recent Labs  Lab 03/29/21 0258 03/30/21 0104 03/31/21 0203  WBC 14.6* 13.3* 9.5  HGB 8.9* 9.1* 8.4*  HCT 27.2* 27.3* 26.1*  PLT 246 279 278   Recent Labs  Lab 03/29/21 0258 03/29/21 2010 03/31/21 0203  NA 135 131* 134*  K 4.2 3.7 3.7  CL 97* 94* 96*  CO2 21* 22 23  BUN 67* 74* 46*  CREATININE 12.05* 12.57* 9.68*  CALCIUM 8.5* 8.0* 8.2*  PROT 6.1* 5.9* 5.6*  BILITOT 1.4* 1.0 1.0  ALKPHOS 162* 152* 124  ALT 118* 93* 65*  AST 41 26 18  GLUCOSE 86 132* 137*      Imaging/Diagnostic Tests: No new imaging  Delora Fuel, MD 03/31/2021, 7:59 AM PGY-1, [place] Intern pager: [phone], text pages welcome

## 2021-03-31 NOTE — Progress Notes (Signed)
Initial Nutrition Assessment  DOCUMENTATION CODES:   Not applicable  INTERVENTION:   -Renal MVI with minerals daily -30 ml Prosource Plus BID, each supplement provides 100 kcals and 15 grams protein  NUTRITION DIAGNOSIS:   Increased nutrient needs related to chronic illness (ESRD on HD) as evidenced by estimated needs.  GOAL:   Patient will meet greater than or equal to 90% of their needs  MONITOR:   PO intake, Supplement acceptance, Labs, Weight trends, Skin, I & O's  REASON FOR ASSESSMENT:   Consult Assessment of nutrition requirement/status  ASSESSMENT:   William [name] is a 80 y.o. male presenting with increased weakness and found to meet sepsis criteria . PMH is significant for ESRD on HD MWF, CAD, HTN, DM,hx multiple falls, subdural hematoma, nonsustained v-tach s/p pacemaker.  Pt admitted with severe sepsis and weakness s/p HD catheter exchange.   Reviewed I/O's: +360 ml x 24 hours and +264 ml since admission   Reviewed wt hx; no wt loss noted over the past year. Per nephrology notes, EDW 60 kg. Pt is currently above dry weight.   Pt unavailable at time of visit. Pt out of room and no family present to provide additional history. RD unable to obtain further nutrition-related history or complete nutrition-focused physical exam at this time.    Pt with good appetite. Noted meal completion 75%. Noted outside food, such as grapes, in pt room.   Medications reviewed and include aranesp.   Albumin has a half-life of 21 days and is strongly affected by stress response and inflammatory process, therefore, do not expect to see an improvement in this lab value during acute hospitalization. When a patient presents with low albumin, it is likely skewed due to the acute inflammatory response.  Unless it is suspected that patient had poor PO intake or malnutrition prior to admission, then RD should not be consulted solely for low albumin. Note that low albumin is no longer  used to diagnose malnutrition; [place] uses the new malnutrition guidelines published by the American Society for Parenteral and Enteral Nutrition (A.S.P.E.N.) and the Academy of Nutrition and Dietetics (AND).     Lab Results  Component Value Date   HGBA1C 6.5 (H) 03/10/2021   PTA DM medications are none.   Labs reviewed: Na: 134, CBGS: 93-218.  Diet Order:   Diet Order             Diet renal with fluid restriction Fluid restriction: 1200 mL Fluid; Room service appropriate? Yes with Assist; Fluid consistency: Thin  Diet effective now                   EDUCATION NEEDS:   No education needs have been identified at this time  Skin:  Skin Assessment: Reviewed RN Assessment  Last BM:  Unknown  Height:   Ht Readings from Last 1 Encounters:  03/29/21 '5\' 7"'$  (1.702 m)    Weight:   Wt Readings from Last 1 Encounters:  03/31/21 65.4 kg    Ideal Body Weight:  67.3 kg  BMI:  Body mass index is 22.58 kg/m.  Estimated Nutritional Needs:   Kcal:  [id number]  Protein:  85-100 grams  Fluid:  1000 ml + UOP    Loistine Chance, RD, LDN, [place] Registered Dietitian II Certified Diabetes Care and Education Specialist Please refer to [facility] for RD and/or RD on-call/weekend/after hours pager
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
## 2021-03-31 NOTE — Progress Notes (Addendum)
Pharmacy Antibiotic Note  William [name] is a 80 y.o. male admitted on 03/28/2021 with sepsis.  Pharmacy has been consulted for vancomycin dosing. This is day 3 of therapy.  He is an HD patient that presented with increased weakness, shakiness and nausea. His dialysis catheter has been clogged x1 week. Currently afebrile, HD catheter replaced, WBC (12.9) Bcx returned MRSE.   Tolerated full HD session 3.5 hrs at blood flow rate of 400 today.  Plan: Vanc 750 mg x1 post HD -Trough goal 15-20 mcg/mL F/U s/sx of infection, fever, WBC F/U TTE/TEE for Endocarditis F/U duration of treatment    Height: '5\' 7"'$  (170.2 cm) Weight: 63.4 kg (139 lb 12.4 oz) IBW/kg (Calculated) : 66.1  Temp (24hrs), Avg:98.3 F (36.8 C), Min:97.6 F (36.4 C), Max:98.8 F (37.1 C)  Recent Labs  Lab 03/26/21 1703 03/28/21 1554 03/28/21 1638 03/28/21 1642 03/28/21 1809 03/29/21 0258 03/29/21 2010 03/30/21 0104 03/31/21 0203  WBC 12.9* 14.0*  --   --   --  14.6*  --  13.3* 9.5  CREATININE 8.88* 12.03* 12.80*  --   --  12.05* 12.57*  --  9.68*  LATICACIDVEN  --   --   --  3.0* 1.6  --   --   --   --      Estimated Creatinine Clearance: 5.5 mL/min (A) (by C-G formula based on SCr of 9.68 mg/dL (H)).    Allergies  Allergen Reactions   Carvedilol Other (See Comments)    Makes him feel like his pelvis is "grinding" (??) Patient doesn't recall this, however    Antimicrobials this admission: Cefepime 6/17 >> 6/18 Metronidazole 6/17 x1 Vanc 6/17>>  Microbiology results: 6/17 BCx: MRSE S to: Rifampin, tetracycline, Vanc. 6/18 Ucx: Multiple species present, suggest recollection 6/19 MRSA PCR: negative   Thank you for allowing pharmacy to be a part of this patient's care.  Asher Muir, PharmD Candidate  Please check AMION for all [place] phone numbers After 10:00 PM, call [place] [phone]

## 2021-03-31 NOTE — Procedures (Signed)
I was present at this dialysis session. I have reviewed the session itself and made appropriate changes.   Patient presented for [facility] exchange about had fevers and admitted.  Now has MRSE bacteremia.  Received vancomycin on 6/17, catheter was exchanged in 6/18, follow-up cultures will be repeated today.  Patient is afebrile in past 24 hours.  Leukocytosis has improved to 9.5.  If bacteremia clears we will plan for a 2-week course of vancomycin.  Cultures returned positive/recurrent, will need a more formal [place] holiday.  Patient seen on HD, 3K bath UF goal 2L, TDC BFR 350.  Hemoglobin 8.4, K3.7.  He is without complaints.  Filed Weights   03/29/21 2240 03/30/21 0500 03/31/21 0421  Weight: 67 kg 70.1 kg 70.5 kg    Recent Labs  Lab 03/31/21 0203  NA 134*  K 3.7  CL 96*  CO2 23  GLUCOSE 137*  BUN 46*  CREATININE 9.68*  CALCIUM 8.2*    Recent Labs  Lab 03/26/21 1703 03/28/21 1554 03/28/21 1638 03/29/21 0258 03/30/21 0104 03/31/21 0203  WBC 12.9* 14.0*  --  14.6* 13.3* 9.5  NEUTROABS 10.2* 12.6*  --  11.6*  --   --   HGB 9.9* 11.2*   < > 8.9* 9.1* 8.4*  HCT 31.3* 33.8*   < > 27.2* 27.3* 26.1*  MCV 101.6* 99.1  --  100.0 97.8 98.5  PLT 264 304  --  246 279 278   < > = values in this interval not displayed.    Scheduled Meds:  aspirin  81 mg Oral Daily   atorvastatin  40 mg Oral Daily   Chlorhexidine Gluconate Cloth  6 each Topical Q0600   darbepoetin (ARANESP) injection - DIALYSIS  60 mcg Intravenous Q Mon-HD   heparin  5,000 Units Subcutaneous Q8H   metoprolol tartrate  12.5 mg Oral BID   midodrine       midodrine  5 mg Oral TID WC   vancomycin variable dose per unstable renal function (pharmacist dosing)   Does not apply See admin instructions   Continuous Infusions:  sodium chloride     sodium chloride     PRN Meds:.sodium chloride, sodium chloride, alteplase, heparin, heparin, lidocaine (PF), lidocaine-prilocaine, pentafluoroprop-tetrafluoroeth   Pearson Grippe   MD 03/31/2021, 8:59 AM

## 2021-03-31 NOTE — Progress Notes (Signed)
Pt completed dialysis treatment without complication.  Removed 2 L.  BP tolerated well, until very end, when 80 sys p resulted. After rinseback, normotensive  Pt had no complaints except stiffness in lower back, and pain at catheter site post-procedure.  Mentation perfect, all vitals stable.  Sent back to unit, report given to Ellen Henri, RN
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
## 2021-04-01 ENCOUNTER — Ambulatory Visit (HOSPITAL_COMMUNITY): Admission: RE | Admit: 2021-04-01 | Payer: Medicare PPO | Source: Ambulatory Visit

## 2021-04-01 ENCOUNTER — Inpatient Hospital Stay (HOSPITAL_COMMUNITY): Payer: Medicare PPO

## 2021-04-01 DIAGNOSIS — R7881 Bacteremia: Secondary | ICD-10-CM

## 2021-04-01 LAB — ECHOCARDIOGRAM COMPLETE
AR max vel: 1.59 cm2
AV Area VTI: 1.89 cm2
AV Area mean vel: 1.54 cm2
AV Mean grad: 5 mmHg
AV Peak grad: 8.7 mmHg
Ao pk vel: 1.48 m/s
Area-P 1/2: 2 cm2
Height: 67 in
S' Lateral: 2.4 cm
Weight: 2236.35 [oz_av]

## 2021-04-01 LAB — RENAL FUNCTION PANEL
Albumin: 2.5 g/dL — ABNORMAL LOW (ref 3.5–5.0)
Anion gap: 17 — ABNORMAL HIGH (ref 5–15)
BUN: 22 mg/dL (ref 8–23)
CO2: 24 mmol/L (ref 22–32)
Calcium: 9.2 mg/dL (ref 8.9–10.3)
Chloride: 95 mmol/L — ABNORMAL LOW (ref 98–111)
Creatinine, Ser: 6.58 mg/dL — ABNORMAL HIGH (ref 0.61–1.24)
GFR, Estimated: 8 mL/min — ABNORMAL LOW
Glucose, Bld: 101 mg/dL — ABNORMAL HIGH (ref 70–99)
Phosphorus: 4.3 mg/dL (ref 2.5–4.6)
Potassium: 4.1 mmol/L (ref 3.5–5.1)
Sodium: 136 mmol/L (ref 135–145)

## 2021-04-01 LAB — CBC
HCT: 30 % — ABNORMAL LOW (ref 39.0–52.0)
Hemoglobin: 9.6 g/dL — ABNORMAL LOW (ref 13.0–17.0)
MCH: 31.7 pg (ref 26.0–34.0)
MCHC: 32 g/dL (ref 30.0–36.0)
MCV: 99 fL (ref 80.0–100.0)
Platelets: 308 K/uL (ref 150–400)
RBC: 3.03 MIL/uL — ABNORMAL LOW (ref 4.22–5.81)
RDW: 15.5 % (ref 11.5–15.5)
WBC: 10.7 K/uL — ABNORMAL HIGH (ref 4.0–10.5)
nRBC: 0 % (ref 0.0–0.2)

## 2021-04-01 LAB — CULTURE, BLOOD (ROUTINE X 2): Special Requests: ADEQUATE

## 2021-04-01 LAB — GLUCOSE, CAPILLARY
Glucose-Capillary: 168 mg/dL — ABNORMAL HIGH (ref 70–99)
Glucose-Capillary: 129 mg/dL — ABNORMAL HIGH (ref 70–99)
Glucose-Capillary: 114 mg/dL — ABNORMAL HIGH (ref 70–99)
Glucose-Capillary: 138 mg/dL — ABNORMAL HIGH (ref 70–99)

## 2021-04-01 MED ORDER — NEPRO/CARBSTEADY PO LIQD
237.0000 mL | Freq: Two times a day (BID) | ORAL | Status: DC
  Administered 2021-04-01 – 2021-04-03 (×3): 237 mL via ORAL

## 2021-04-01 NOTE — Progress Notes (Signed)
OT Cancellation Note  Patient Details Name: William [name] MRN: [id number] DOB: [birth year]   Cancelled Treatment:    Reason Eval/Treat Not Completed: Other (comment). Pt declined, stating that he feels "terrible" and feels worse today than yesterday despite HD. Pt requesting to rest. Of note, as OT was leaving pt looked at clock on the wall and asked if it was 11:00 AM or PM despite blinds being open beside pt. Plan to reattempt at a later time/date.  [name], OT Acute Rehabilitation Services Pager: [phone] Office: [phone]  04/01/2021, 1:24 PM

## 2021-04-01 NOTE — Progress Notes (Signed)
[facility]   Reason for visit: Follow up on bacteremia  Interval History: repeat blood cultures sent today, WBC 10.7, afebrile.  No new complaints.  Day 5 vancomycin  Physical Exam: Constitutional:  Vitals:   04/01/21 0829 04/01/21 1220  BP: 108/63 (!) 167/93  Pulse: 68 61  Resp: 20 20  Temp: 98.4 F (36.9 C) 98.2 F (36.8 C)  SpO2: 98% 99%   patient appears in NAD Respiratory: Normal respiratory effort; CTA B Cardiovascular: RRR GI: soft, nt, nd  Review of Systems: Constitutional: negative for fevers and chills Gastrointestinal: negative for diarrhea  Lab Results  Component Value Date   WBC 10.7 (H) 04/01/2021   HGB 9.6 (L) 04/01/2021   HCT 30.0 (L) 04/01/2021   MCV 99.0 04/01/2021   PLT 308 04/01/2021    Lab Results  Component Value Date   CREATININE 6.58 (H) 04/01/2021   BUN 22 04/01/2021   NA 136 04/01/2021   K 4.1 04/01/2021   CL 95 (L) 04/01/2021   CO2 24 04/01/2021    Lab Results  Component Value Date   ALT 65 (H) 03/31/2021   AST 18 03/31/2021   ALKPHOS 124 03/31/2021     Microbiology: Recent Results (from the past 240 hour(s))  Resp Panel by RT-PCR (Flu A&B, Covid) Nasopharyngeal Swab     Status: None   Collection Time: 03/26/21  5:50 PM   Specimen: Nasopharyngeal Swab; Nasopharyngeal(NP) swabs in vial transport medium  Result Value Ref Range Status   SARS Coronavirus 2 by RT PCR NEGATIVE NEGATIVE Final    Comment: (NOTE) SARS-CoV-2 target nucleic acids are NOT DETECTED.  The SARS-CoV-2 RNA is generally detectable in upper respiratory specimens during the acute phase of infection. The lowest concentration of SARS-CoV-2 viral copies this assay can detect is 138 copies/mL. A negative result does not preclude SARS-Cov-2 infection and should not be used as the sole basis for treatment or other patient management decisions. A negative result may occur with  improper specimen collection/handling, submission of  specimen other than nasopharyngeal swab, presence of viral mutation(s) within the areas targeted by this assay, and inadequate number of viral copies(<138 copies/mL). A negative result must be combined with clinical observations, patient history, and epidemiological information. The expected result is Negative.  Fact Sheet for Patients:  EntrepreneurPulse.com.au  Fact Sheet for Healthcare Providers:  IncredibleEmployment.be  This test is no t yet approved or cleared by the [place] FDA and  has been authorized for detection and/or diagnosis of SARS-CoV-2 by FDA under an Emergency Use Authorization (EUA). This EUA will remain  in effect (meaning this test can be used) for the duration of the COVID-19 declaration under Section 564(b)(1) of the Act, 21 U.S.C.section 360bbb-3(b)(1), unless the authorization is terminated  or revoked sooner.       Influenza A by PCR NEGATIVE NEGATIVE Final   Influenza B by PCR NEGATIVE NEGATIVE Final    Comment: (NOTE) The Xpert Xpress SARS-CoV-2/FLU/RSV plus assay is intended as an aid in the diagnosis of influenza from Nasopharyngeal swab specimens and should not be used as a sole basis for treatment. Nasal washings and aspirates are unacceptable for Xpert Xpress SARS-CoV-2/FLU/RSV testing.  Fact Sheet for Patients: EntrepreneurPulse.com.au  Fact Sheet for Healthcare Providers: IncredibleEmployment.be  This test is not yet approved or cleared by the [place] FDA and has been authorized for detection and/or diagnosis of SARS-CoV-2 by FDA under an Emergency Use Authorization (EUA). This EUA will remain in effect (  meaning this test can be used) for the duration of the COVID-19 declaration under Section 564(b)(1) of the Act, 21 U.S.C. section 360bbb-3(b)(1), unless the authorization is terminated or revoked.  Performed at [facility], [place] [address],  [place], [place] [address]   Resp Panel by RT-PCR (Flu A&B, Covid) Nasopharyngeal Swab     Status: None   Collection Time: 03/28/21  4:25 PM   Specimen: Nasopharyngeal Swab; Nasopharyngeal(NP) swabs in vial transport medium  Result Value Ref Range Status   SARS Coronavirus 2 by RT PCR NEGATIVE NEGATIVE Final    Comment: (NOTE) SARS-CoV-2 target nucleic acids are NOT DETECTED.  The SARS-CoV-2 RNA is generally detectable in upper respiratory specimens during the acute phase of infection. The lowest concentration of SARS-CoV-2 viral copies this assay can detect is 138 copies/mL. A negative result does not preclude SARS-Cov-2 infection and should not be used as the sole basis for treatment or other patient management decisions. A negative result may occur with  improper specimen collection/handling, submission of specimen other than nasopharyngeal swab, presence of viral mutation(s) within the areas targeted by this assay, and inadequate number of viral copies(<138 copies/mL). A negative result must be combined with clinical observations, patient history, and epidemiological information. The expected result is Negative.  Fact Sheet for Patients:  EntrepreneurPulse.com.au  Fact Sheet for Healthcare Providers:  IncredibleEmployment.be  This test is no t yet approved or cleared by the [place] FDA and  has been authorized for detection and/or diagnosis of SARS-CoV-2 by FDA under an Emergency Use Authorization (EUA). This EUA will remain  in effect (meaning this test can be used) for the duration of the COVID-19 declaration under Section 564(b)(1) of the Act, 21 U.S.C.section 360bbb-3(b)(1), unless the authorization is terminated  or revoked sooner.       Influenza A by PCR NEGATIVE NEGATIVE Final   Influenza B by PCR NEGATIVE NEGATIVE Final    Comment: (NOTE) The Xpert Xpress SARS-CoV-2/FLU/RSV plus assay is intended as an aid in the diagnosis of  influenza from Nasopharyngeal swab specimens and should not be used as a sole basis for treatment. Nasal washings and aspirates are unacceptable for Xpert Xpress SARS-CoV-2/FLU/RSV testing.  Fact Sheet for Patients: EntrepreneurPulse.com.au  Fact Sheet for Healthcare Providers: IncredibleEmployment.be  This test is not yet approved or cleared by the [place] FDA and has been authorized for detection and/or diagnosis of SARS-CoV-2 by FDA under an Emergency Use Authorization (EUA). This EUA will remain in effect (meaning this test can be used) for the duration of the COVID-19 declaration under Section 564(b)(1) of the Act, 21 U.S.C. section 360bbb-3(b)(1), unless the authorization is terminated or revoked.  Performed at [facility], [place] [address], [place], [place] [address]   Blood Culture (routine x 2)     Status: Abnormal (Preliminary result)   Collection Time: 03/28/21  4:25 PM   Specimen: BLOOD  Result Value Ref Range Status   Specimen Description BLOOD BLOOD RIGHT FOREARM  Final   Special Requests   Final    BOTTLES DRAWN AEROBIC AND ANAEROBIC Blood Culture adequate volume   Culture  Setup Time   Final    GRAM POSITIVE COCCI IN BOTH AEROBIC AND ANAEROBIC BOTTLES CRITICAL RESULT CALLED TO, READ BACK BY AND VERIFIED WITH: Homero Fellers PHARMD 23296/18/22 A BROWNING Performed at [facility], [place] [address], [place], [place] [address]    Culture STAPHYLOCOCCUS EPIDERMIDIS (A)  Final   Report Status PENDING  Incomplete  Organism ID, Bacteria STAPHYLOCOCCUS EPIDERMIDIS  Final      Susceptibility   Staphylococcus epidermidis - MIC*    CIPROFLOXACIN >=8 RESISTANT Resistant     ERYTHROMYCIN >=8 RESISTANT Resistant     GENTAMICIN 8 INTERMEDIATE Intermediate     OXACILLIN >=4 RESISTANT Resistant     TETRACYCLINE 2 SENSITIVE Sensitive     VANCOMYCIN 1 SENSITIVE Sensitive     TRIMETH/SULFA 80 RESISTANT Resistant     CLINDAMYCIN  >=8 RESISTANT Resistant     RIFAMPIN <=0.5 SENSITIVE Sensitive     Inducible Clindamycin NEGATIVE Sensitive     * STAPHYLOCOCCUS EPIDERMIDIS  Blood Culture ID Panel (Reflexed)     Status: Abnormal   Collection Time: 03/28/21  4:25 PM  Result Value Ref Range Status   Enterococcus faecalis NOT DETECTED NOT DETECTED Final   Enterococcus Faecium NOT DETECTED NOT DETECTED Final   Listeria monocytogenes NOT DETECTED NOT DETECTED Final   Staphylococcus species DETECTED (A) NOT DETECTED Final    Comment: CRITICAL RESULT CALLED TO, READ BACK BY AND VERIFIED WITH: J LEDFORD PHARMD 2329 03/29/21 A BROWNING    Staphylococcus aureus (BCID) NOT DETECTED NOT DETECTED Final   Staphylococcus epidermidis DETECTED (A) NOT DETECTED Final    Comment: Methicillin (oxacillin) resistant coagulase negative staphylococcus. Possible blood culture contaminant (unless isolated from more than one blood culture draw or clinical case suggests pathogenicity). No antibiotic treatment is indicated for blood  culture contaminants. CRITICAL RESULT CALLED TO, READ BACK BY AND VERIFIED WITH: J LEDFORD PHARMD 2329 03/29/21 A BROWNING    Staphylococcus lugdunensis NOT DETECTED NOT DETECTED Final   Streptococcus species NOT DETECTED NOT DETECTED Final   Streptococcus agalactiae NOT DETECTED NOT DETECTED Final   Streptococcus pneumoniae NOT DETECTED NOT DETECTED Final   Streptococcus pyogenes NOT DETECTED NOT DETECTED Final   A.calcoaceticus-baumannii NOT DETECTED NOT DETECTED Final   Bacteroides fragilis NOT DETECTED NOT DETECTED Final   Enterobacterales NOT DETECTED NOT DETECTED Final   Enterobacter cloacae complex NOT DETECTED NOT DETECTED Final   Escherichia coli NOT DETECTED NOT DETECTED Final   Klebsiella aerogenes NOT DETECTED NOT DETECTED Final   Klebsiella oxytoca NOT DETECTED NOT DETECTED Final   Klebsiella pneumoniae NOT DETECTED NOT DETECTED Final   Proteus species NOT DETECTED NOT DETECTED Final   Salmonella  species NOT DETECTED NOT DETECTED Final   Serratia marcescens NOT DETECTED NOT DETECTED Final   Haemophilus influenzae NOT DETECTED NOT DETECTED Final   Neisseria meningitidis NOT DETECTED NOT DETECTED Final   Pseudomonas aeruginosa NOT DETECTED NOT DETECTED Final   Stenotrophomonas maltophilia NOT DETECTED NOT DETECTED Final   Candida albicans NOT DETECTED NOT DETECTED Final   Candida auris NOT DETECTED NOT DETECTED Final   Candida glabrata NOT DETECTED NOT DETECTED Final   Candida krusei NOT DETECTED NOT DETECTED Final   Candida parapsilosis NOT DETECTED NOT DETECTED Final   Candida tropicalis NOT DETECTED NOT DETECTED Final   Cryptococcus neoformans/gattii NOT DETECTED NOT DETECTED Final   Methicillin resistance mecA/C DETECTED (A) NOT DETECTED Final    Comment: CRITICAL RESULT CALLED TO, READ BACK BY AND VERIFIED WITHLenna Sciara [facility] PHARMD 2329 03/29/21 A BROWNING Performed at [facility], [address] [place], [place] [address]   Blood Culture (routine x 2)     Status: Abnormal   Collection Time: 03/28/21  5:00 PM   Specimen: BLOOD RIGHT WRIST  Result Value Ref Range Status   Specimen Description BLOOD RIGHT WRIST  Final   Special Requests  Final    BOTTLES DRAWN AEROBIC AND ANAEROBIC Blood Culture adequate volume   Culture  Setup Time   Final    GRAM POSITIVE COCCI IN BOTH AEROBIC AND ANAEROBIC BOTTLES    Culture (A)  Final    STAPHYLOCOCCUS EPIDERMIDIS SUSCEPTIBILITIES PERFORMED ON PREVIOUS CULTURE WITHIN THE LAST 5 DAYS. Performed at [facility], [place] [address], [place], [place] [address]    Report Status 03/31/2021 FINAL  Final  Urine culture     Status: Abnormal   Collection Time: 03/29/21  3:56 AM   Specimen: In/Out Cath Urine  Result Value Ref Range Status   Specimen Description IN/OUT CATH URINE  Final   Special Requests   Final    NONE Performed at [facility], [place] [address], [place], [place] [address]    Culture MULTIPLE SPECIES PRESENT,  SUGGEST RECOLLECTION (A)  Final   Report Status 03/30/2021 FINAL  Final  MRSA Next Gen by PCR, Nasal     Status: None   Collection Time: 03/30/21  6:21 AM  Result Value Ref Range Status   MRSA by PCR Next Gen NOT DETECTED NOT DETECTED Final    Comment: (NOTE) The GeneXpert MRSA Assay (FDA approved for NASAL specimens only), is one component of a comprehensive MRSA colonization surveillance program. It is not intended to diagnose MRSA infection nor to guide or monitor treatment for MRSA infections. Test performance is not FDA approved in patients less than 69 years old. Performed at [facility], [place] [address], [place], [place] [address]     Impression/Plan:  1. Bacteremia - Staph epi in multiple bottles and repeat cultures now sent.  TTE done and to get a TEE tomorrow.   Continue with vancomycin Duration depending on TEE  2.  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    3. Fever - no further fever at this time since admission.  From #1 and on antibiotics as above.

## 2021-04-01 NOTE — Progress Notes (Signed)
William [name] Progress Note   Subjective:     Patient seen and examined at bedside-seen eating lunch. Reports feeling okay. Denies SOB, CP, palpitations, ABD pain, N/V/D. Patient remains afebrile. Follow-up blood cultures pending. Tolerated yesterday's HD with net UF 2L. Plan for HD 6/22.  Objective Vitals:   03/31/21 2315 04/01/21 0314 04/01/21 0829 04/01/21 1220  BP: 119/64 128/74 108/63 (!) 167/93  Pulse: 62 69 68 61  Resp: '15 13 20 20  '$ Temp: 98.6 F (37 C) 98.5 F (36.9 C) 98.4 F (36.9 C) 98.2 F (36.8 C)  TempSrc: Oral Oral Oral Oral  SpO2: 100% 98% 98% 99%  Weight:      Height:       Physical Exam General: Well appearing male; No acute distress Heart: RRR; no murmurs, gallops, or rubs Lungs: Clear throughout; No wheezing, rales, or rhonchi Abdomen: Soft, non-tender Extremities: No edema BLLE Dialysis Access:  R femoral [facility]   Filed Weights   03/31/21 0421 03/31/21 0800 03/31/21 1215  Weight: 70.5 kg 65.4 kg 63.4 kg    Intake/Output Summary (Last 24 hours) at 04/01/2021 1353 Last data filed at 04/01/2021 0900 Gross per 24 hour  Intake 408 ml  Output --  Net 408 ml    Additional Objective Labs: Basic Metabolic Panel: Recent Labs  Lab 03/29/21 2010 03/31/21 0203 04/01/21 0435  NA 131* 134* 136  K 3.7 3.7 4.1  CL 94* 96* 95*  CO2 '22 23 24  '$ GLUCOSE 132* 137* 101*  BUN 74* 46* 22  CREATININE 12.57* 9.68* 6.58*  CALCIUM 8.0* 8.2* 9.2  PHOS  --   --  4.3   Liver Function Tests: Recent Labs  Lab 03/29/21 0258 03/29/21 2010 03/31/21 0203 04/01/21 0435  AST 41 26 18  --   ALT 118* 93* 65*  --   ALKPHOS 162* 152* 124  --   BILITOT 1.4* 1.0 1.0  --   PROT 6.1* 5.9* 5.6*  --   ALBUMIN 2.5* 2.3* 2.2* 2.5*   Recent Labs  Lab 03/28/21 1554  LIPASE 61*   CBC: Recent Labs  Lab 03/26/21 1703 03/28/21 1554 03/28/21 1638 03/29/21 0258 03/30/21 0104 03/31/21 0203 04/01/21 0435  WBC 12.9* 14.0*  --  14.6* 13.3* 9.5 10.7*   NEUTROABS 10.2* 12.6*  --  11.6*  --   --   --   HGB 9.9* 11.2*   < > 8.9* 9.1* 8.4* 9.6*  HCT 31.3* 33.8*   < > 27.2* 27.3* 26.1* 30.0*  MCV 101.6* 99.1  --  100.0 97.8 98.5 99.0  PLT 264 304  --  246 279 278 308   < > = values in this interval not displayed.   Blood Culture    Component Value Date/Time   SDES IN/OUT CATH URINE 03/29/2021 0356   SPECREQUEST  03/29/2021 0356    NONE Performed at [facility] [address], [place], [place] [address]    CULT MULTIPLE SPECIES PRESENT, SUGGEST RECOLLECTION (A) 03/29/2021 0356   REPTSTATUS 03/30/2021 FINAL 03/29/2021 0356    Cardiac Enzymes: No results for input(s): CKTOTAL, CKMB, CKMBINDEX, TROPONINI in the last 168 hours. CBG: Recent Labs  Lab 03/31/21 1238 03/31/21 1611 03/31/21 2006 04/01/21 0840 04/01/21 1224  GLUCAP 73 130* 142* 168* 129*   Iron Studies: No results for input(s): IRON, TIBC, TRANSFERRIN, FERRITIN in the last 72 hours. Lab Results  Component Value Date   INR 1.3 (H) 03/29/2021   INR 1.2 03/28/2021   INR 1.2  03/26/2021   Studies/Results: ECHOCARDIOGRAM COMPLETE  Result Date: 04/01/2021    ECHOCARDIOGRAM REPORT   Patient Name:   William [name] Date of Exam: 04/01/2021 Medical Rec #:  [id number]         Height:       67.0 in Accession #:    [id number]        Weight:       139.8 lb Date of Birth:  21-May-1941         BSA:          1.737 m Patient Age:    80 years          BP:           108/63 mmHg Patient Gender: M                 HR:           60 bpm. Exam Location:  Inpatient Procedure: 2D Echo, Cardiac Doppler and Color Doppler Indications:    Bacteremia R78.81  History:        Patient has prior history of Echocardiogram examinations, most                 recent 09/09/2020. CHF, Pacemaker, PAD, Signs/Symptoms:Dyspnea;                 Risk Factors:Hypertension, Diabetes and Dyslipidemia. COVID-19.                 ESRD.  Sonographer:    William [name] Referring Phys: William [name]  1.  Left ventricular ejection fraction, by estimation, is 55 to 60%. The left ventricle has normal function. The left ventricle has no regional wall motion abnormalities. There is mild left ventricular hypertrophy. Left ventricular diastolic parameters are consistent with Grade I diastolic dysfunction (impaired relaxation).  2. Right ventricular systolic function is normal. The right ventricular size is normal. There is normal pulmonary artery systolic pressure.  3. The mitral valve is normal in structure. No evidence of mitral valve regurgitation. No evidence of mitral stenosis.  4. The aortic valve is tricuspid. Aortic valve regurgitation is not visualized. Mild aortic valve sclerosis is present, with no evidence of aortic valve stenosis.  5. The inferior vena cava is normal in size with greater than 50% respiratory variability, suggesting right atrial pressure of 3 mmHg. FINDINGS  Left Ventricle: Left ventricular ejection fraction, by estimation, is 55 to 60%. The left ventricle has normal function. The left ventricle has no regional wall motion abnormalities. The left ventricular internal cavity size was normal in size. There is  mild left ventricular hypertrophy. Left ventricular diastolic parameters are consistent with Grade I diastolic dysfunction (impaired relaxation). Right Ventricle: The right ventricular size is normal. Right ventricular systolic function is normal. There is normal pulmonary artery systolic pressure. The tricuspid regurgitant velocity is 2.33 m/s, and with an assumed right atrial pressure of 3 mmHg,  the estimated right ventricular systolic pressure is [id number] mmHg. Left Atrium: Left atrial size was normal in size. Right Atrium: Right atrial size was normal in size. Pericardium: There is no evidence of pericardial effusion. Mitral Valve: The mitral valve is normal in structure. No evidence of mitral valve regurgitation. No evidence of mitral valve stenosis. Tricuspid Valve: The tricuspid valve  is normal in structure. Tricuspid valve regurgitation is mild . No evidence of tricuspid stenosis. Aortic Valve: The aortic valve is tricuspid. Aortic valve regurgitation is not visualized. Mild aortic valve sclerosis is present, with  no evidence of aortic valve stenosis. Aortic valve mean gradient measures 5.0 mmHg. Aortic valve peak gradient measures 8.7 mmHg. Aortic valve area, by VTI measures 1.89 cm. Pulmonic Valve: The pulmonic valve was not well visualized. Pulmonic valve regurgitation is not visualized. No evidence of pulmonic stenosis. Aorta: The aortic root is normal in size and structure. Venous: The inferior vena cava is normal in size with greater than 50% respiratory variability, suggesting right atrial pressure of 3 mmHg.  Additional Comments: A device lead is visualized.  LEFT VENTRICLE PLAX 2D LVIDd:         3.30 cm  Diastology LVIDs:         2.40 cm  LV e' medial:    4.78 cm/s LV PW:         1.00 cm  LV E/e' medial:  12.8 LV IVS:        1.30 cm  LV e' lateral:   7.46 cm/s LVOT diam:     1.90 cm  LV E/e' lateral: 8.2 LV SV:         54 LV SV Index:   31 LVOT Area:     2.84 cm  RIGHT VENTRICLE            IVC RV Basal diam:  2.20 cm    IVC diam: 1.40 cm RV S prime:     8.76 cm/s TAPSE (M-mode): 1.4 cm LEFT ATRIUM             Index       RIGHT ATRIUM          Index William diam:        3.10 cm 1.79 cm/m  RA Area:     5.90 cm William Vol (A2C):   28.8 ml 16.58 ml/m RA Volume:   8.52 ml  4.91 ml/m William Vol (A4C):   14.5 ml 8.35 ml/m William Biplane Vol: 20.7 ml 11.92 ml/m  AORTIC VALVE AV Area (Vmax):    1.59 cm AV Area (Vmean):   1.54 cm AV Area (VTI):     1.89 cm AV Vmax:           147.50 cm/s AV Vmean:          103.500 cm/s AV VTI:            0.286 m AV Peak Grad:      8.7 mmHg AV Mean Grad:      5.0 mmHg LVOT Vmax:         82.50 cm/s LVOT Vmean:        56.300 cm/s LVOT VTI:          0.191 m LVOT/AV VTI ratio: 0.67  AORTA Ao Root diam: 3.10 cm Ao Asc diam:  2.75 cm MITRAL VALVE                TRICUSPID VALVE  MV Area (PHT): 2.00 cm     TR Peak grad:   21.7 mmHg MV Decel Time: 380 msec     TR Vmax:        233.00 cm/s MV E velocity: 61.30 cm/s MV A velocity: 100.00 cm/s  SHUNTS MV E/A ratio:  0.61         Systemic VTI:  0.19 m                             Systemic Diam: 1.90 cm William Ruths MD Electronically signed by William Ruths MD  Signature Date/Time: 04/01/2021/1:35:43 PM    Final     Medications:   (feeding supplement) PROSource Plus  30 mL Oral BID BM   aspirin  81 mg Oral Daily   atorvastatin  40 mg Oral Daily   Chlorhexidine Gluconate Cloth  6 each Topical Q0600   darbepoetin (ARANESP) injection - DIALYSIS  60 mcg Intravenous Q Mon-HD   heparin  5,000 Units Subcutaneous Q8H   metoprolol tartrate  12.5 mg Oral BID   midodrine  5 mg Oral TID WC   multivitamin  1 tablet Oral QHS   vancomycin variable dose per unstable renal function (pharmacist dosing)   Does not apply See admin instructions    Dialysis Orders: MWF at [facility] 4hr, 400/A1.5, EDW 60kg, 2K/2Ca, UFP #4, TDC, heparin 4000 - Venofer '50mg'$  IV weekly - Calcitriol 1.73mg PO q HD  Assessment/Plan: Sepsis/fever/weakness: WBC improving, Blood Cx growing MRSE. 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 On abx at time of exchange - f/u blood cx pending  ESRD:  Usual MWF schedule - next HD scheduled for tomorrow (6/22)  Hypotension/volume: Alittle over EDW-patient euvolemic on exam-had low Bps previously-metoprolol recently reduced.  Last BP reading elevated. Plan for UFG 2-3L as tolerated-monitor BP Anemia: Hgb 9.6 today - no overt blood loss-continue Aranesp  Metabolic bone disease: Ca ok, Phos pending. Resume home meds/binders.  Nutrition:  Alb low continue protein supplements.  T2DM  CAD, Hx VT, pacemaker  Transaminitis: Slow improvement over past few days   CTobie Poet NP CUnion StarKidney [name] 04/01/2021,1:53 PM  LOS: 4 days

## 2021-04-01 NOTE — Progress Notes (Addendum)
Family Medicine Teaching Service Daily Progress Note Intern Pager: [phone]  Patient name: William [name] Medical record number: [id number] Date of birth: May 24, 1941 Age: 80 y.o. Gender: male  Primary Care Provider: Matilde Haymaker, MD Consultants: Nephro Code Status: Full  Pt Overview and Major Events to Date:  6/17 Admitted  Assessment and Plan: William [name] is a 80 y.o. male presenting with increased weakness and found to meet sepsis criteria . PMH is significant for ESRD on HD MWF, CAD, HTN, DM,hx multiple falls, subdural hematoma, nonsustained v-tach s/p pacemaker.   Severe Sepsis Remains afebrile with stable Vital signs.  ID Consulted.  Plan to continue Vancomycin dosing.  TTE today.  Repeat Blood cultures drawn.   - ID following, appreciate recs - Vancomycin dosing for Pharm - follow up Blood cultures - follow up TTE - Order TEE  ESRD on HD (MWF) Had dialysis yesterday without issue.  Continue plan for inpatient dialysis while treating. - Nephro following, appreciate recs  FEN/GI: Carb modified diet PPx: SubQ Heparin   Status is: Inpatient  Remains inpatient appropriate because:IV treatments appropriate due to intensity of illness or inability to take PO  Dispo: The patient is from: Home              Anticipated d/c is to: Home              Patient currently is not medically stable to d/c.   Difficult to place patient No    Subjective:  Patient indicates feeling better, but still having hiccups.  Objective: Temp:  [97.6 F (36.4 C)-99.1 F (37.3 C)] 98.4 F (36.9 C) (06/21 0829) Pulse Rate:  [62-100] 68 (06/21 0829) Resp:  [13-25] 20 (06/21 0829) BP: (88-138)/(53-100) 108/63 (06/21 0829) SpO2:  [97 %-100 %] 98 % (06/21 0829) Weight:  [63.4 kg] 63.4 kg (06/20 1215) Physical Exam:  Physical Exam Constitutional:      General: He is not in acute distress. HENT:     Head: Normocephalic and atraumatic.     Mouth/Throat:     Mouth: Mucous membranes  are moist.  Cardiovascular:     Rate and Rhythm: Normal rate and regular rhythm.  Pulmonary:     Effort: Pulmonary effort is normal.     Breath sounds: Normal breath sounds.  Musculoskeletal:     Right lower leg: No edema.     Left lower leg: No edema.  Neurological:     General: No focal deficit present.     Mental Status: He is alert.     Laboratory: Recent Labs  Lab 03/30/21 0104 03/31/21 0203 04/01/21 0435  WBC 13.3* 9.5 10.7*  HGB 9.1* 8.4* 9.6*  HCT 27.3* 26.1* 30.0*  PLT 279 278 308   Recent Labs  Lab 03/29/21 0258 03/29/21 2010 03/31/21 0203 04/01/21 0435  NA 135 131* 134* 136  K 4.2 3.7 3.7 4.1  CL 97* 94* 96* 95*  CO2 21* '22 23 24  '$ BUN 67* 74* 46* 22  CREATININE 12.05* 12.57* 9.68* 6.58*  CALCIUM 8.5* 8.0* 8.2* 9.2  PROT 6.1* 5.9* 5.6*  --   BILITOT 1.4* 1.0 1.0  --   ALKPHOS 162* 152* 124  --   ALT 118* 93* 65*  --   AST 41 26 18  --   GLUCOSE 86 132* 137* 101*     Imaging/Diagnostic Tests: No new Imaging  Delora Fuel, MD 04/01/2021, 8:35 AM PGY-1, [place] Intern pager: [phone], text pages welcome

## 2021-04-01 NOTE — Progress Notes (Signed)
PT Cancellation Note  Patient Details Name: William [name] MRN: [id number] DOB: 1941-05-10   Cancelled Treatment:    Reason Eval/Treat Not Completed: Patient declined, no reason specified politely refuses PT today, tells me he has a procedure tomorrow and he won't get up today despite education and coaxing. Will continue to follow.   Windell Norfolk, DPT, PN1   Supplemental Physical Therapist [facility]    Pager [phone] Acute Rehab Office [phone]

## 2021-04-01 NOTE — Progress Notes (Signed)
Nutrition Follow-up  DOCUMENTATION CODES:   Not applicable  INTERVENTION:   -D/c Prosource Plus -Nepro Shake po BID, each supplement provides 425 kcal and 19 grams protein  -Continue renal MVI daily  NUTRITION DIAGNOSIS:   Increased nutrient needs related to chronic illness (ESRD on HD) as evidenced by estimated needs.  Ongoing  GOAL:   Patient will meet greater than or equal to 90% of their needs  Progressing   MONITOR:   PO intake, Supplement acceptance, Labs, Weight trends, Skin, I & O's  REASON FOR ASSESSMENT:   Consult Assessment of nutrition requirement/status  ASSESSMENT:   William [name] is a 80 y.o. male presenting with increased weakness and found to meet sepsis criteria . PMH is significant for ESRD on HD MWF, CAD, HTN, DM,hx multiple falls, subdural hematoma, nonsustained v-tach s/p pacemaker.  Reviewed I/O's: -1.6 L x 24 hours and 1.3 L since admission   Case discussed with RN, who reports pt consumed 50-75% of breakfast meal with assistance. Pt took Prosource supplement this morning, but did not like it.   Spoke with pt at bedside, who expressed frustration over medical situation. He reports he has experienced a general decline over the past several weeks, which started with edema in his legs. He shares he "knew something was wrong", but frustrated that it took him this long to receive a diagnosis. Over the past few weeks, intake has been "non-existent secondary to sepsis.   Observed lunch tray, which was untouched. Per pt, he does not like the Prosource supplements. He is familiar with Nepro, as he consumes this at HD and would like to continue this.   Pt endorses wt loss, but unsure of UBW or dry weight. Per nephrology notes, EDW 60 kg.  Discussed importance of good meal and supplement intake to promote healing.   Labs reviewed: CBGS: 129-168 (inpatient orders for glycemic control are none).    NUTRITION - FOCUSED PHYSICAL EXAM:  Flowsheet Row  Most Recent Value  Orbital Region No depletion  Upper Arm Region No depletion  Thoracic and Lumbar Region No depletion  Buccal Region No depletion  Temple Region Mild depletion  Clavicle Bone Region Mild depletion  Clavicle and Acromion Bone Region No depletion  Scapular Bone Region No depletion  Dorsal Hand No depletion  Patellar Region Mild depletion  Anterior Thigh Region Mild depletion  Posterior Calf Region Mild depletion  Edema (RD Assessment) Mild  Hair Reviewed  Eyes Reviewed  Mouth Reviewed  Skin Reviewed  Nails Reviewed       Diet Order:   Diet Order             Diet NPO time specified  Diet effective midnight           Diet renal with fluid restriction Fluid restriction: 1200 mL Fluid; Room service appropriate? Yes with Assist; Fluid consistency: Thin  Diet effective now                   EDUCATION NEEDS:   No education needs have been identified at this time  Skin:  Skin Assessment: Reviewed RN Assessment  Last BM:  Unknown  Height:   Ht Readings from Last 1 Encounters:  03/29/21 '5\' 7"'$  (1.702 m)    Weight:   Wt Readings from Last 1 Encounters:  03/31/21 63.4 kg    Ideal Body Weight:  67.3 kg  BMI:  Body mass index is 21.89 kg/m.  Estimated Nutritional Needs:   Kcal:  [id number]  Protein:  85-100 grams  Fluid:  1000 ml + UOP    Loistine Chance, RD, LDN, [place] Registered Dietitian II Certified Diabetes Care and Education Specialist Please refer to [facility] for RD and/or RD on-call/weekend/after hours pager

## 2021-04-01 NOTE — Progress Notes (Signed)
    [facility] has been requested to perform a transesophageal echocardiogram on William [name] for Bacteremia  After careful review of history and examination, the risks and benefits of transesophageal echocardiogram have been explained including risks of esophageal damage, perforation (1:10,000 risk), bleeding, pharyngeal hematoma as well as other potential complications associated with conscious sedation including aspiration, arrhythmia, respiratory failure and death. Alternatives to treatment were discussed, questions were answered. Patient is willing to proceed.  TEE - Dr. Harrell Gave @ 1400. NPO after midnight. Meds with sips.   Leanor Kail, PA-C 04/01/2021 1:23 PM

## 2021-04-01 NOTE — Progress Notes (Signed)
  Echocardiogram 2D Echocardiogram has been performed.  William [name] 04/01/2021, 10:51 AM

## 2021-04-02 ENCOUNTER — Inpatient Hospital Stay (HOSPITAL_COMMUNITY): Payer: Medicare PPO | Admitting: Anesthesiology

## 2021-04-02 ENCOUNTER — Encounter (HOSPITAL_COMMUNITY): Payer: Self-pay | Admitting: Family Medicine

## 2021-04-02 ENCOUNTER — Inpatient Hospital Stay (HOSPITAL_COMMUNITY): Payer: Medicare PPO

## 2021-04-02 ENCOUNTER — Encounter (HOSPITAL_COMMUNITY)
Admission: EM | Disposition: A | Discharge status: Home / Self Care | Payer: Self-pay | Source: Home / Self Care | Attending: Family Medicine

## 2021-04-02 DIAGNOSIS — B957 Other staphylococcus as the cause of diseases classified elsewhere: Secondary | ICD-10-CM

## 2021-04-02 DIAGNOSIS — R7881 Bacteremia: Secondary | ICD-10-CM | POA: Diagnosis present

## 2021-04-02 DIAGNOSIS — I10 Essential (primary) hypertension: Secondary | ICD-10-CM

## 2021-04-02 HISTORY — PX: TEE WITHOUT CARDIOVERSION: SHX5443

## 2021-04-02 LAB — GLUCOSE, CAPILLARY
Glucose-Capillary: 113 mg/dL — ABNORMAL HIGH (ref 70–99)
Glucose-Capillary: 86 mg/dL (ref 70–99)
Glucose-Capillary: 71 mg/dL (ref 70–99)
Glucose-Capillary: 116 mg/dL — ABNORMAL HIGH (ref 70–99)
Glucose-Capillary: 101 mg/dL — ABNORMAL HIGH (ref 70–99)
Glucose-Capillary: 102 mg/dL — ABNORMAL HIGH (ref 70–99)

## 2021-04-02 LAB — CBC
HCT: 29.5 % — ABNORMAL LOW (ref 39.0–52.0)
Hemoglobin: 9.6 g/dL — ABNORMAL LOW (ref 13.0–17.0)
MCH: 32.3 pg (ref 26.0–34.0)
MCHC: 32.5 g/dL (ref 30.0–36.0)
MCV: 99.3 fL (ref 80.0–100.0)
Platelets: 295 10*3/uL (ref 150–400)
RBC: 2.97 MIL/uL — ABNORMAL LOW (ref 4.22–5.81)
RDW: 15.5 % (ref 11.5–15.5)
WBC: 9.9 10*3/uL (ref 4.0–10.5)
nRBC: 0 % (ref 0.0–0.2)

## 2021-04-02 LAB — RENAL FUNCTION PANEL
Albumin: 2.5 g/dL — ABNORMAL LOW (ref 3.5–5.0)
Anion gap: 14 (ref 5–15)
BUN: 28 mg/dL — ABNORMAL HIGH (ref 8–23)
CO2: 26 mmol/L (ref 22–32)
Calcium: 9 mg/dL (ref 8.9–10.3)
Chloride: 93 mmol/L — ABNORMAL LOW (ref 98–111)
Creatinine, Ser: 8.06 mg/dL — ABNORMAL HIGH (ref 0.61–1.24)
GFR, Estimated: 6 mL/min — ABNORMAL LOW (ref >60)
Glucose, Bld: 103 mg/dL — ABNORMAL HIGH (ref 70–99)
Phosphorus: 3.9 mg/dL (ref 2.5–4.6)
Potassium: 4 mmol/L (ref 3.5–5.1)
Sodium: 133 mmol/L — ABNORMAL LOW (ref 135–145)

## 2021-04-02 SURGERY — ECHOCARDIOGRAM, TRANSESOPHAGEAL
Anesthesia: Monitor Anesthesia Care

## 2021-04-02 MED ORDER — PROPOFOL 500 MG/50ML IV EMUL
INTRAVENOUS | Status: DC | PRN
  Administered 2021-04-02: 100 ug/kg/min via INTRAVENOUS

## 2021-04-02 MED ORDER — SODIUM CHLORIDE 0.9 % IV SOLN: INTRAVENOUS | Status: DC

## 2021-04-02 MED ORDER — HEPARIN SODIUM (PORCINE) 1000 UNIT/ML IJ SOLN
INTRAMUSCULAR | Status: AC
  Administered 2021-04-02: 1000 [IU]
  Filled 2021-04-02: qty 6

## 2021-04-02 MED ORDER — DEXTROSE 50 % IV SOLN
INTRAVENOUS | Status: AC
  Filled 2021-04-02: qty 50

## 2021-04-02 MED ORDER — VANCOMYCIN HCL 750 MG/150ML IV SOLN
750.0000 mg | Freq: Once | INTRAVENOUS | Status: AC
  Administered 2021-04-02: 750 mg via INTRAVENOUS
  Filled 2021-04-02 (×3): qty 150

## 2021-04-02 MED ORDER — PROPOFOL 10 MG/ML IV BOLUS
INTRAVENOUS | Status: DC | PRN
  Administered 2021-04-02: 20 mg via INTRAVENOUS
  Administered 2021-04-02: 30 mg via INTRAVENOUS

## 2021-04-02 MED ORDER — DEXTROSE 50 % IV SOLN
INTRAVENOUS | Status: DC | PRN
  Administered 2021-04-02: .5 via INTRAVENOUS

## 2021-04-02 MED ORDER — LIDOCAINE 2% (20 MG/ML) 5 ML SYRINGE
INTRAMUSCULAR | Status: DC | PRN
  Administered 2021-04-02: 60 mg via INTRAVENOUS

## 2021-04-02 NOTE — Progress Notes (Signed)
OT Cancellation Note  Patient Details Name: William [name] MRN: [id number] DOB: 1941/08/19   Cancelled Treatment:    Reason Eval/Treat Not Completed: Patient at procedure or test/ unavailable (Pt currently in HD.)  Malka So 04/02/2021, 8:25 AM Nestor Lewandowsky, OTR/L Acute Rehabilitation Services Pager: [phone] Office: [phone]

## 2021-04-02 NOTE — Anesthesia Preprocedure Evaluation (Addendum)
Anesthesia Evaluation  Patient identified by MRN, date of birth, ID band Patient awake    Reviewed: Allergy & Precautions, NPO status , Patient's Chart, lab work & pertinent test results  Airway Mallampati: II  TM Distance: >3 FB Neck ROM: Full    Dental  (+) Partial Upper, Dental Advisory Given   Pulmonary shortness of breath,  sarcoidosis   breath sounds clear to auscultation       Cardiovascular hypertension, Pt. on medications and Pt. on home beta blockers + CAD and + Peripheral Vascular Disease  + pacemaker  Rhythm:Regular Rate:Normal  04/01/2021 ECHO: EF 55-60%, normal LVF, no significant valvular abnormalities   Neuro/Psych 2016 SDH    GI/Hepatic GERD  Medicated,  Endo/Other  diabetes (glu 86)  Renal/GU ESRF and DialysisRenal disease (K+ 4.0)     Musculoskeletal   Abdominal Normal abdominal exam  (+)   Peds  Hematology  (+) Blood dyscrasia (Hb 9.6), anemia ,   Anesthesia Other Findings   Reproductive/Obstetrics                            Anesthesia Physical Anesthesia Plan  ASA: 3  Anesthesia Plan: MAC   Post-op Pain Management:    Induction: Intravenous  PONV Risk Score and Plan: 0 and Propofol infusion  Airway Management Planned: Natural Airway and Nasal Cannula  Additional Equipment: None  Intra-op Plan:   Post-operative Plan:   Informed Consent: I have reviewed the patients History and Physical, chart, labs and discussed the procedure including the risks, benefits and alternatives for the proposed anesthesia with the patient or authorized representative who has indicated his/her understanding and acceptance.     Dental advisory given  Plan Discussed with: CRNA  Anesthesia Plan Comments:         Anesthesia Quick Evaluation

## 2021-04-02 NOTE — Interval H&P Note (Signed)
History and Physical Interval Note:  04/02/2021 1:09 PM  William [name]  has presented today for surgery, with the diagnosis of BACTEREMIA.  The various methods of treatment have been discussed with the patient and family. After consideration of risks, benefits and other options for treatment, the patient has consented to  Procedure(s): TRANSESOPHAGEAL ECHOCARDIOGRAM (TEE) (N/A) as a surgical intervention.  The patient's history has been reviewed, patient examined, no change in status, stable for surgery.  I have reviewed the patient's chart and labs.  Questions were answered to the patient's satisfaction.     [name] Gave

## 2021-04-02 NOTE — Progress Notes (Signed)
PT Cancellation Note  Patient Details Name: William [name] MRN: [id number] DOB: 1941-06-24   Cancelled Treatment:    Reason Eval/Treat Not Completed: Patient at procedure or test/unavailable at HD, sounds like he may also have additional procedure later today as well. Will attempt to return as time/schedule allow.   Windell Norfolk, DPT, PN1   Supplemental Physical Therapist [facility]    Pager [phone] Acute Rehab Office [phone]

## 2021-04-02 NOTE — CV Procedure (Signed)
    TRANSESOPHAGEAL ECHOCARDIOGRAM   NAME:  William [name]   MRN: [id number] DOB:  1940/12/01   ADMIT DATE: 03/28/2021  INDICATIONS: Bacteremia  PROCEDURE:   Informed consent was obtained prior to the procedure. The risks, benefits and alternatives for the procedure were discussed and the patient comprehended these risks.  Risks include, but are not limited to, cough, sore throat, vomiting, nausea, somnolence, esophageal and stomach trauma or perforation, bleeding, low blood pressure, aspiration, pneumonia, infection, trauma to the teeth and death.    Procedural time out performed.     Patient received monitored anesthesia care under the supervision of Dr. Smith Robert. Patient received a total of 189 mg propofol during the procedure.  The transesophageal probe was inserted in the esophagus and stomach without difficulty and multiple views were obtained.    COMPLICATIONS:    There were no immediate complications.  FINDINGS:  LEFT VENTRICLE: EF = 55-60%.  No regional wall motion abnormalities.  RIGHT VENTRICLE: Normal size and function.   LEFT ATRIUM: No thrombus/mass.  LEFT ATRIAL APPENDAGE: No thrombus/mass.   RIGHT ATRIUM: No thrombus/mass.  AORTIC VALVE:  Trileaflet. No regurgitation. No vegetation.  MITRAL VALVE:    Normal structure. No significant regurgitation. No vegetation.  TRICUSPID VALVE: Normal structure. Mild regurgitation. No vegetation.  PULMONIC VALVE: Grossly normal structure. Trivial regurgitation. No apparent vegetation.  INTERATRIAL SEPTUM: No PFO or ASD seen by color Doppler.  PERICARDIUM: Trivial effusion noted.  DESCENDING AORTA: Moderate diffuse plaque seen   CONCLUSION: No evidence of endocarditis. RA and RV pacer leads interrogated, no evidence of echodensities concerning for lead infection.  Buford Dresser, MD, PhD [facility]  [address] [place], [place] [address] [phone]   2:18 PM

## 2021-04-02 NOTE — Transfer of Care (Signed)
Immediate Anesthesia Transfer of Care Note  Patient: William [name]  Procedure(s) Performed: TRANSESOPHAGEAL ECHOCARDIOGRAM (TEE)  Patient Location: Endoscopy Unit  Anesthesia Type:MAC  Level of Consciousness: drowsy and patient cooperative  Airway & Oxygen Therapy: Patient Spontanous Breathing and Patient connected to nasal cannula oxygen  Post-op Assessment: Report given to RN and Post -op Vital signs reviewed and stable  Post vital signs: Reviewed and stable  Last Vitals:  Vitals Value Taken Time  BP    Temp    Pulse    Resp    SpO2      Last Pain:  Vitals:   04/02/21 1305  TempSrc: Temporal  PainSc: 0-No pain         Complications: No notable events documented.

## 2021-04-02 NOTE — Anesthesia Postprocedure Evaluation (Signed)
Anesthesia Post Note  Patient: William [name]  Procedure(s) Performed: TRANSESOPHAGEAL ECHOCARDIOGRAM (TEE)     Patient location during evaluation: PACU Anesthesia Type: MAC Level of consciousness: awake and alert Pain management: pain level controlled Vital Signs Assessment: post-procedure vital signs reviewed and stable Respiratory status: spontaneous breathing, nonlabored ventilation, respiratory function stable and patient connected to nasal cannula oxygen Cardiovascular status: stable and blood pressure returned to baseline Postop Assessment: no apparent nausea or vomiting Anesthetic complications: no   No notable events documented.  Last Vitals:  Vitals:   04/02/21 1505 04/02/21 1515  BP: 121/62 (!) 120/59  Pulse: 71 67  Resp: 16 17  Temp:    SpO2: 99% 100%    Last Pain:  Vitals:   04/02/21 1515  TempSrc:   PainSc: 0-No pain                 [name]

## 2021-04-02 NOTE — Progress Notes (Signed)
Family Medicine Teaching Service Daily Progress Note Intern Pager: [phone]  Patient name: William [name] Medical record number: [id number] Date of birth: 1940-11-08 Age: 80 y.o. Gender: male  Primary Care Provider: Matilde Haymaker, MD Consultants: Nephro, ID Code Status: Full  Pt Overview and Major Events to Date:  6/17 Admitted  Assessment and Plan: TRAYCEN CIESLIK is a 80 y.o. male presenting with increased weakness and found to meet sepsis criteria . PMH is significant for ESRD on HD MWF, CAD, HTN, DM,hx multiple falls, subdural hematoma, nonsustained v-tach s/p pacemaker.  Severe Sepsis TTE showed no vegetations or other concerning findings.  TEE scheduled today following dialysis.  Repeat blood cultures negative after 24 hours.  WBC resolved. - ID following, appreciate recs - Continue IV Vanc  ESRD on HD (MWF) Receiving dialysis this AM.  Feeling well. - Nephro following, appreciate recs   FEN/GI: Carb Modified diet PPx: SubQ Heparin   Status is: Inpatient  Remains inpatient appropriate because:IV treatments appropriate due to intensity of illness or inability to take PO  Dispo: The patient is from: Home              Anticipated d/c is to: Home              Patient currently is not medically stable to d/c.   Difficult to place patient No   Subjective:  Patient feeling well this morning.  Discussed plan for today and continued antibiotics.  Objective: Temp:  [97.8 F (36.6 C)-98.6 F (37 C)] 97.8 F (36.6 C) (06/22 0520) Pulse Rate:  [60-68] 61 (06/22 0520) Resp:  [13-20] 13 (06/22 0520) BP: (108-167)/(63-93) 135/78 (06/22 0520) SpO2:  [97 %-99 %] 97 % (06/22 0520) Physical Exam:  Physical Exam HENT:     Head: Normocephalic and atraumatic.  Cardiovascular:     Rate and Rhythm: Normal rate and regular rhythm.  Pulmonary:     Effort: Pulmonary effort is normal.     Breath sounds: Normal breath sounds.  Skin:    General: Skin is warm.  Neurological:      General: No focal deficit present.     Mental Status: He is alert.      Laboratory: Recent Labs  Lab 03/31/21 0203 04/01/21 0435 04/02/21 0112  WBC 9.5 10.7* 9.9  HGB 8.4* 9.6* 9.6*  HCT 26.1* 30.0* 29.5*  PLT 278 308 295   Recent Labs  Lab 03/29/21 0258 03/29/21 2010 03/31/21 0203 04/01/21 0435 04/02/21 0112  NA 135 131* 134* 136 133*  K 4.2 3.7 3.7 4.1 4.0  CL 97* 94* 96* 95* 93*  CO2 21* '22 23 24 26  '$ BUN 67* 74* 46* 22 28*  CREATININE 12.05* 12.57* 9.68* 6.58* 8.06*  CALCIUM 8.5* 8.0* 8.2* 9.2 9.0  PROT 6.1* 5.9* 5.6*  --   --   BILITOT 1.4* 1.0 1.0  --   --   ALKPHOS 162* 152* 124  --   --   ALT 118* 93* 65*  --   --   AST 41 26 18  --   --   GLUCOSE 86 132* 137* 101* 103*    Imaging/Diagnostic Tests:  ECHOCARDIOGRAM REPORT         Patient Name:   [name] Date of Exam: 04/01/2021  Medical Rec #:  [id number]         Height:       67.0 in  Accession #:    [id number]        Weight:  139.8 lb  Date of Birth:  25-Sep-1941         BSA:          1.737 m  Patient Age:    13 years          BP:           108/63 mmHg  Patient Gender: M                 HR:           60 bpm.  Exam Location:  Inpatient   Procedure: 2D Echo, Cardiac Doppler and Color Doppler   Indications:    Bacteremia R78.81     History:        Patient has prior history of Echocardiogram examinations,  most                  recent 09/09/2020. CHF, Pacemaker, PAD,  Signs/Symptoms:Dyspnea;                  Risk Factors:Hypertension, Diabetes and Dyslipidemia.  COVID-19.                  ESRD.     Sonographer:    Jonelle Sidle Dance  Referring Phys: [place]     1. Left ventricular ejection fraction, by estimation, is 55 to 60%. The  left ventricle has normal function. The left ventricle has no regional  wall motion abnormalities. There is mild left ventricular hypertrophy.  Left ventricular diastolic parameters  are consistent with Grade I diastolic  dysfunction (impaired relaxation).   2. Right ventricular systolic function is normal. The right ventricular  size is normal. There is normal pulmonary artery systolic pressure.   3. The mitral valve is normal in structure. No evidence of mitral valve  regurgitation. No evidence of mitral stenosis.   4. The aortic valve is tricuspid. Aortic valve regurgitation is not  visualized. Mild aortic valve sclerosis is present, with no evidence of  aortic valve stenosis.   5. The inferior vena cava is normal in size with greater than 50%  respiratory variability, suggesting right atrial pressure of 3 mmHg.   FINDINGS   Left Ventricle: Left ventricular ejection fraction, by estimation, is 55  to 60%. The left ventricle has normal function. The left ventricle has no  regional wall motion abnormalities. The left ventricular internal cavity  size was normal in size. There is   mild left ventricular hypertrophy. Left ventricular diastolic parameters  are consistent with Grade I diastolic dysfunction (impaired relaxation).   Right Ventricle: The right ventricular size is normal. Right ventricular  systolic function is normal. There is normal pulmonary artery systolic  pressure. The tricuspid regurgitant velocity is 2.33 m/s, and with an  assumed right atrial pressure of 3 mmHg,   the estimated right ventricular systolic pressure is [id number] mmHg.   Left Atrium: Left atrial size was normal in size.   Right Atrium: Right atrial size was normal in size.   Pericardium: There is no evidence of pericardial effusion.   Mitral Valve: The mitral valve is normal in structure. No evidence of  mitral valve regurgitation. No evidence of mitral valve stenosis.   Tricuspid Valve: The tricuspid valve is normal in structure. Tricuspid  valve regurgitation is mild . No evidence of tricuspid stenosis.   Aortic Valve: The aortic valve is tricuspid. Aortic valve regurgitation is  not visualized. Mild aortic valve  sclerosis is present, with no evidence  of aortic valve stenosis. Aortic valve mean gradient measures 5.0 mmHg.  Aortic valve peak gradient  measures 8.7 mmHg. Aortic valve area, by VTI measures 1.89 cm.   Pulmonic Valve: The pulmonic valve was not well visualized. Pulmonic valve  regurgitation is not visualized. No evidence of pulmonic stenosis.   Aorta: The aortic root is normal in size and structure.   Venous: The inferior vena cava is normal in size with greater than 50%  respiratory variability, suggesting right atrial pressure of 3 mmHg.     Additional Comments: A device lead is visualized.      LEFT VENTRICLE  PLAX 2D  LVIDd:         3.30 cm  Diastology  LVIDs:         2.40 cm  LV e' medial:    4.78 cm/s  LV PW:         1.00 cm  LV E/e' medial:  12.8  LV IVS:        1.30 cm  LV e' lateral:   7.46 cm/s  LVOT diam:     1.90 cm  LV E/e' lateral: 8.2  LV SV:         54  LV SV Index:   31  LVOT Area:     2.84 cm      RIGHT VENTRICLE            IVC  RV Basal diam:  2.20 cm    IVC diam: 1.40 cm  RV S prime:     8.76 cm/s  TAPSE (M-mode): 1.4 cm   LEFT ATRIUM             Index       RIGHT ATRIUM          Index  LA diam:        3.10 cm 1.79 cm/m  RA Area:     5.90 cm  LA Vol (A2C):   28.8 ml 16.58 ml/m RA Volume:   8.52 ml  4.91 ml/m  LA Vol (A4C):   14.5 ml 8.35 ml/m  LA Biplane Vol: 20.7 ml 11.92 ml/m   AORTIC VALVE  AV Area (Vmax):    1.59 cm  AV Area (Vmean):   1.54 cm  AV Area (VTI):     1.89 cm  AV Vmax:           147.50 cm/s  AV Vmean:          103.500 cm/s  AV VTI:            0.286 m  AV Peak Grad:      8.7 mmHg  AV Mean Grad:      5.0 mmHg  LVOT Vmax:         82.50 cm/s  LVOT Vmean:        56.300 cm/s  LVOT VTI:          0.191 m  LVOT/AV VTI ratio: 0.67     AORTA  Ao Root diam: 3.10 cm  Ao Asc diam:  2.75 cm   MITRAL VALVE                TRICUSPID VALVE  MV Area (PHT): 2.00 cm     TR Peak grad:   21.7 mmHg  MV Decel Time: 380 msec     TR  Vmax:        233.00 cm/s  MV E velocity: 61.30 cm/s  MV A velocity: 100.00 cm/s  SHUNTS  MV E/A ratio:  0.61         Systemic VTI:  0.19 m                              Systemic Diam: 1.90 cm   Kirk Ruths MD  Electronically signed by Kirk Ruths MD  Signature Date/Time: 04/01/2021/1:35:43 PM      Delora Fuel, MD 04/02/2021, 6:23 AM PGY-1, [place] Intern pager: [phone], text pages welcome

## 2021-04-02 NOTE — Progress Notes (Signed)
Pharmacy Antibiotic Note  William [name] is a 80 y.o. male admitted on 03/28/2021 with sepsis.  Pharmacy has been consulted for vancomycin dosing. This is day 5 of therapy.  He is an HD patient. HD catheter replaced 6/18. Currently afebrile, WBC (9.9), Bcx returned MRSE. TTE:neg for vegetation. TEE: 6/22 pending  Tolerated full HD session 3 hrs at blood flow rate of 400 today 6/22.  Plan: Vanc 750 mg x1 post HD Trough goal 15-20 mcg/mL F/U s/sx of infection, fever, WBC F/U TEE for Endocarditis F/U duration of treatment  Height: '5\' 7"'$  (170.2 cm) Weight: 62 kg (136 lb 11 oz) IBW/kg (Calculated) : 66.1  Temp (24hrs), Avg:98.2 F (36.8 C), Min:97.7 F (36.5 C), Max:98.6 F (37 C)  Recent Labs  Lab 03/28/21 1642 03/28/21 1809 03/29/21 0258 03/29/21 2010 03/30/21 0104 03/31/21 0203 04/01/21 0435 04/02/21 0112  WBC  --   --  14.6*  --  13.3* 9.5 10.7* 9.9  CREATININE  --   --  12.05* 12.57*  --  9.68* 6.58* 8.06*  LATICACIDVEN 3.0* 1.6  --   --   --   --   --   --      Estimated Creatinine Clearance: 6.5 mL/min (A) (by C-G formula based on SCr of 8.06 mg/dL (H)).    Allergies  Allergen Reactions   Carvedilol Other (See Comments)    Makes him feel like his pelvis is "grinding" (??) Patient doesn't recall this, however    Antimicrobials this admission: Cefepime 6/17 >> 6/18 Metronidazole 6/17 x1 Vanc 6/17>>  Microbiology results: 6/17 BCx: 4/4 MRSE S to: Rifampin, tetracycline, Vanc. 6/18 Ucx: Multiple species present, suggest recollection 6/19 MRSA PCR: negative  6/21 Bcx: NG x1day  Thank you for allowing pharmacy to be a part of this patient's care.  Asher Muir, PharmD Candidate  Please check AMION for all [place] phone numbers After 10:00 PM, call [place] [phone]

## 2021-04-02 NOTE — Progress Notes (Signed)
[facility] for Infectious Disease   Reason for visit: Follow up on bacteremia  Interval History: TEE negative for vegetation on heart or leads/PM.  WBC wnl.  No fever.  Ay 6 vancomycin   Physical Exam: Constitutional:  Vitals:   04/02/21 1515 04/02/21 1549  BP: (!) 120/59 114/76  Pulse: 67 67  Resp: 17 18  Temp:  97.7 F (36.5 C)  SpO2: 100% 98%   patient appears in NAD; patient seen on dialysis Respiratory: Normal respiratory effort; CTA B Cardiovascular: RRR   Review of Systems: Constitutional: negative for fevers and chills Gastrointestinal: negative for diarrhea  Lab Results  Component Value Date   WBC 9.9 04/02/2021   HGB 9.6 (L) 04/02/2021   HCT 29.5 (L) 04/02/2021   MCV 99.3 04/02/2021   PLT 295 04/02/2021    Lab Results  Component Value Date   CREATININE 8.06 (H) 04/02/2021   BUN 28 (H) 04/02/2021   NA 133 (L) 04/02/2021   K 4.0 04/02/2021   CL 93 (L) 04/02/2021   CO2 26 04/02/2021    Lab Results  Component Value Date   ALT 65 (H) 03/31/2021   AST 18 03/31/2021   ALKPHOS 124 03/31/2021     Microbiology: Recent Results (from the past 240 hour(s))  Resp Panel by RT-PCR (Flu A&B, Covid) Nasopharyngeal Swab     Status: None   Collection Time: 03/26/21  5:50 PM   Specimen: Nasopharyngeal Swab; Nasopharyngeal(NP) swabs in vial transport medium  Result Value Ref Range Status   SARS Coronavirus 2 by RT PCR NEGATIVE NEGATIVE Final    Comment: (NOTE) SARS-CoV-2 target nucleic acids are NOT DETECTED.  The SARS-CoV-2 RNA is generally detectable in upper respiratory specimens during the acute phase of infection. The lowest concentration of SARS-CoV-2 viral copies this assay can detect is 138 copies/mL. A negative result does not preclude SARS-Cov-2 infection and should not be used as the sole basis for treatment or other patient management decisions. A negative result may occur with  improper specimen collection/handling, submission of specimen  other than nasopharyngeal swab, presence of viral mutation(s) within the areas targeted by this assay, and inadequate number of viral copies(<138 copies/mL). A negative result must be combined with clinical observations, patient history, and epidemiological information. The expected result is Negative.  Fact Sheet for Patients:  EntrepreneurPulse.com.au  Fact Sheet for Healthcare Providers:  IncredibleEmployment.be  This test is no t yet approved or cleared by the [place] FDA and  has been authorized for detection and/or diagnosis of SARS-CoV-2 by FDA under an Emergency Use Authorization (EUA). This EUA will remain  in effect (meaning this test can be used) for the duration of the COVID-19 declaration under Section 564(b)(1) of the Act, 21 U.S.C.section 360bbb-3(b)(1), unless the authorization is terminated  or revoked sooner.       Influenza A by PCR NEGATIVE NEGATIVE Final   Influenza B by PCR NEGATIVE NEGATIVE Final    Comment: (NOTE) The Xpert Xpress SARS-CoV-2/FLU/RSV plus assay is intended as an aid in the diagnosis of influenza from Nasopharyngeal swab specimens and should not be used as a sole basis for treatment. Nasal washings and aspirates are unacceptable for Xpert Xpress SARS-CoV-2/FLU/RSV testing.  Fact Sheet for Patients: EntrepreneurPulse.com.au  Fact Sheet for Healthcare Providers: IncredibleEmployment.be  This test is not yet approved or cleared by the [place] FDA and has been authorized for detection and/or diagnosis of SARS-CoV-2 by FDA under an Emergency Use Authorization (EUA). This EUA will remain  in effect (meaning this test can be used) for the duration of the COVID-19 declaration under Section 564(b)(1) of the Act, 21 U.S.C. section 360bbb-3(b)(1), unless the authorization is terminated or revoked.  Performed at [facility], [place] [address] [place],  [place] [address]   Resp Panel by RT-PCR (Flu A&B, Covid) Nasopharyngeal Swab     Status: None   Collection Time: 03/28/21  4:25 PM   Specimen: Nasopharyngeal Swab; Nasopharyngeal(NP) swabs in vial transport medium  Result Value Ref Range Status   SARS Coronavirus 2 by RT PCR NEGATIVE NEGATIVE Final    Comment: (NOTE) SARS-CoV-2 target nucleic acids are NOT DETECTED.  The SARS-CoV-2 RNA is generally detectable in upper respiratory specimens during the acute phase of infection. The lowest concentration of SARS-CoV-2 viral copies this assay can detect is 138 copies/mL. A negative result does not preclude SARS-Cov-2 infection and should not be used as the sole basis for treatment or other patient management decisions. A negative result may occur with  improper specimen collection/handling, submission of specimen other than nasopharyngeal swab, presence of viral mutation(s) within the areas targeted by this assay, and inadequate number of viral copies(<138 copies/mL). A negative result must be combined with clinical observations, patient history, and epidemiological information. The expected result is Negative.  Fact Sheet for Patients:  EntrepreneurPulse.com.au  Fact Sheet for Healthcare Providers:  IncredibleEmployment.be  This test is no t yet approved or cleared by the [place] FDA and  has been authorized for detection and/or diagnosis of SARS-CoV-2 by FDA under an Emergency Use Authorization (EUA). This EUA will remain  in effect (meaning this test can be used) for the duration of the COVID-19 declaration under Section 564(b)(1) of the Act, 21 U.S.C.section 360bbb-3(b)(1), unless the authorization is terminated  or revoked sooner.       Influenza A by PCR NEGATIVE NEGATIVE Final   Influenza B by PCR NEGATIVE NEGATIVE Final    Comment: (NOTE) The Xpert Xpress SARS-CoV-2/FLU/RSV plus assay is intended as an aid in the diagnosis of influenza  from Nasopharyngeal swab specimens and should not be used as a sole basis for treatment. Nasal washings and aspirates are unacceptable for Xpert Xpress SARS-CoV-2/FLU/RSV testing.  Fact Sheet for Patients: EntrepreneurPulse.com.au  Fact Sheet for Healthcare Providers: IncredibleEmployment.be  This test is not yet approved or cleared by the [place] FDA and has been authorized for detection and/or diagnosis of SARS-CoV-2 by FDA under an Emergency Use Authorization (EUA). This EUA will remain in effect (meaning this test can be used) for the duration of the COVID-19 declaration under Section 564(b)(1) of the Act, 21 U.S.C. section 360bbb-3(b)(1), unless the authorization is terminated or revoked.  Performed at [facility], [place] [address], [place], [place] [address]   Blood Culture (routine x 2)     Status: Abnormal   Collection Time: 03/28/21  4:25 PM   Specimen: BLOOD  Result Value Ref Range Status   Specimen Description BLOOD BLOOD RIGHT FOREARM  Final   Special Requests   Final    BOTTLES DRAWN AEROBIC AND ANAEROBIC Blood Culture adequate volume   Culture  Setup Time   Final    GRAM POSITIVE COCCI IN BOTH AEROBIC AND ANAEROBIC BOTTLES CRITICAL RESULT CALLED TO, READ BACK BY AND VERIFIED WITH: Homero Fellers PHARMD 23296/18/22 A BROWNING Performed at [facility], [place] [address], [place],  [address]    Culture STAPHYLOCOCCUS EPIDERMIDIS (A)  Final   Report Status 04/01/2021 FINAL  Final  Organism ID, Bacteria STAPHYLOCOCCUS EPIDERMIDIS  Final      Susceptibility   Staphylococcus epidermidis - MIC*    CIPROFLOXACIN >=8 RESISTANT Resistant     ERYTHROMYCIN >=8 RESISTANT Resistant     GENTAMICIN 8 INTERMEDIATE Intermediate     OXACILLIN >=4 RESISTANT Resistant     TETRACYCLINE 2 SENSITIVE Sensitive     VANCOMYCIN 1 SENSITIVE Sensitive     TRIMETH/SULFA 80 RESISTANT Resistant     CLINDAMYCIN >=8 RESISTANT Resistant      RIFAMPIN <=0.5 SENSITIVE Sensitive     Inducible Clindamycin NEGATIVE Sensitive     * STAPHYLOCOCCUS EPIDERMIDIS  Blood Culture ID Panel (Reflexed)     Status: Abnormal   Collection Time: 03/28/21  4:25 PM  Result Value Ref Range Status   Enterococcus faecalis NOT DETECTED NOT DETECTED Final   Enterococcus Faecium NOT DETECTED NOT DETECTED Final   Listeria monocytogenes NOT DETECTED NOT DETECTED Final   Staphylococcus species DETECTED (A) NOT DETECTED Final    Comment: CRITICAL RESULT CALLED TO, READ BACK BY AND VERIFIED WITH: J LEDFORD PHARMD 2329 03/29/21 A BROWNING    Staphylococcus aureus (BCID) NOT DETECTED NOT DETECTED Final   Staphylococcus epidermidis DETECTED (A) NOT DETECTED Final    Comment: Methicillin (oxacillin) resistant coagulase negative staphylococcus. Possible blood culture contaminant (unless isolated from more than one blood culture draw or clinical case suggests pathogenicity). No antibiotic treatment is indicated for blood  culture contaminants. CRITICAL RESULT CALLED TO, READ BACK BY AND VERIFIED WITH: J LEDFORD PHARMD 2329 03/29/21 A BROWNING    Staphylococcus lugdunensis NOT DETECTED NOT DETECTED Final   Streptococcus species NOT DETECTED NOT DETECTED Final   Streptococcus agalactiae NOT DETECTED NOT DETECTED Final   Streptococcus pneumoniae NOT DETECTED NOT DETECTED Final   Streptococcus pyogenes NOT DETECTED NOT DETECTED Final   A.calcoaceticus-baumannii NOT DETECTED NOT DETECTED Final   Bacteroides fragilis NOT DETECTED NOT DETECTED Final   Enterobacterales NOT DETECTED NOT DETECTED Final   Enterobacter cloacae complex NOT DETECTED NOT DETECTED Final   Escherichia coli NOT DETECTED NOT DETECTED Final   Klebsiella aerogenes NOT DETECTED NOT DETECTED Final   Klebsiella oxytoca NOT DETECTED NOT DETECTED Final   Klebsiella pneumoniae NOT DETECTED NOT DETECTED Final   Proteus species NOT DETECTED NOT DETECTED Final   Salmonella species NOT DETECTED NOT  DETECTED Final   Serratia marcescens NOT DETECTED NOT DETECTED Final   Haemophilus influenzae NOT DETECTED NOT DETECTED Final   Neisseria meningitidis NOT DETECTED NOT DETECTED Final   Pseudomonas aeruginosa NOT DETECTED NOT DETECTED Final   Stenotrophomonas maltophilia NOT DETECTED NOT DETECTED Final   Candida albicans NOT DETECTED NOT DETECTED Final   Candida auris NOT DETECTED NOT DETECTED Final   Candida glabrata NOT DETECTED NOT DETECTED Final   Candida krusei NOT DETECTED NOT DETECTED Final   Candida parapsilosis NOT DETECTED NOT DETECTED Final   Candida tropicalis NOT DETECTED NOT DETECTED Final   Cryptococcus neoformans/gattii NOT DETECTED NOT DETECTED Final   Methicillin resistance mecA/C DETECTED (A) NOT DETECTED Final    Comment: CRITICAL RESULT CALLED TO, READ BACK BY AND VERIFIED WITHLenna Sciara [facility] PHARMD 2329 03/29/21 A BROWNING Performed at [facility], [address], [place], [place] [address]   Blood Culture (routine x 2)     Status: Abnormal   Collection Time: 03/28/21  5:00 PM   Specimen: BLOOD RIGHT WRIST  Result Value Ref Range Status   Specimen Description BLOOD RIGHT WRIST  Final   Special Requests  Final    BOTTLES DRAWN AEROBIC AND ANAEROBIC Blood Culture adequate volume   Culture  Setup Time   Final    GRAM POSITIVE COCCI IN BOTH AEROBIC AND ANAEROBIC BOTTLES    Culture (A)  Final    STAPHYLOCOCCUS EPIDERMIDIS SUSCEPTIBILITIES PERFORMED ON PREVIOUS CULTURE WITHIN THE LAST 5 DAYS. Performed at [facility], [place] [address] [place], [place] [address]    Report Status 03/31/2021 FINAL  Final  Urine culture     Status: Abnormal   Collection Time: 03/29/21  3:56 AM   Specimen: In/Out Cath Urine  Result Value Ref Range Status   Specimen Description IN/OUT CATH URINE  Final   Special Requests   Final    NONE Performed at [facility], [place] [address], [place], [place] [address]    Culture MULTIPLE SPECIES PRESENT, SUGGEST RECOLLECTION (A)   Final   Report Status 03/30/2021 FINAL  Final  MRSA Next Gen by PCR, Nasal     Status: None   Collection Time: 03/30/21  6:21 AM  Result Value Ref Range Status   MRSA by PCR Next Gen NOT DETECTED NOT DETECTED Final    Comment: (NOTE) The GeneXpert MRSA Assay (FDA approved for NASAL specimens only), is one component of a comprehensive MRSA colonization surveillance program. It is not intended to diagnose MRSA infection nor to guide or monitor treatment for MRSA infections. Test performance is not FDA approved in patients less than 33 years old. Performed at [facility], [place] [address], [place], [place] [address]   Culture, blood (routine x 2)     Status: None (Preliminary result)   Collection Time: 04/01/21  4:35 AM   Specimen: BLOOD  Result Value Ref Range Status   Specimen Description BLOOD RIGHT ANTECUBITAL  Final   Special Requests   Final    BOTTLES DRAWN AEROBIC AND ANAEROBIC Blood Culture adequate volume   Culture   Final    NO GROWTH 1 DAY Performed at [facility], [place] [address], [place], [place] [address]    Report Status PENDING  Incomplete  Culture, blood (routine x 2)     Status: None (Preliminary result)   Collection Time: 04/01/21  4:39 AM   Specimen: BLOOD LEFT HAND  Result Value Ref Range Status   Specimen Description BLOOD LEFT HAND  Final   Special Requests   Final    BOTTLES DRAWN AEROBIC AND ANAEROBIC Blood Culture adequate volume   Culture   Final    NO GROWTH 1 DAY Performed at [facility], [place] [address], [place], [place] [address]    Report Status PENDING  Incomplete    Impression/Plan:  1. Bacteremia - repeat cultures no growth to date and TEE without any concerns for infection.  Likely from line which has been changed.  No indication for further line holiday.   I recommend 2 weeks of vancomycin after dialysis from negative blood culture yesterday through July 4th, as long as it remains negative.   2.  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  Has femoral catheter.    3.  Fever - no further fever.  Plan as above.   I will sign off, call with questions.

## 2021-04-02 NOTE — Procedures (Signed)
I was present at this dialysis session. I have reviewed the session itself and made appropriate changes.   2K bath. UF goal 2.5L.  Femoral TDC Qb 350.  TEE planned today.  TTE neg for vegetations.   Filed Weights   03/31/21 0800 03/31/21 1215 04/02/21 0825  Weight: 65.4 kg 63.4 kg 64.6 kg    Recent Labs  Lab 04/02/21 0112  NA 133*  K 4.0  CL 93*  CO2 26  GLUCOSE 103*  BUN 28*  CREATININE 8.06*  CALCIUM 9.0  PHOS 3.9    Recent Labs  Lab 03/26/21 1703 03/28/21 1554 03/28/21 1638 03/29/21 0258 03/30/21 0104 03/31/21 0203 04/01/21 0435 04/02/21 0112  WBC 12.9* 14.0*  --  14.6*   < > 9.5 10.7* 9.9  NEUTROABS 10.2* 12.6*  --  11.6*  --   --   --   --   HGB 9.9* 11.2*   < > 8.9*   < > 8.4* 9.6* 9.6*  HCT 31.3* 33.8*   < > 27.2*   < > 26.1* 30.0* 29.5*  MCV 101.6* 99.1  --  100.0   < > 98.5 99.0 99.3  PLT 264 304  --  246   < > 278 308 295   < > = values in this interval not displayed.    Scheduled Meds:  aspirin  81 mg Oral Daily   atorvastatin  40 mg Oral Daily   Chlorhexidine Gluconate Cloth  6 each Topical Q0600   darbepoetin (ARANESP) injection - DIALYSIS  60 mcg Intravenous Q Mon-HD   feeding supplement (NEPRO CARB STEADY)  237 mL Oral BID BM   heparin  5,000 Units Subcutaneous Q8H   metoprolol tartrate  12.5 mg Oral BID   midodrine  5 mg Oral TID WC   multivitamin  1 tablet Oral QHS   vancomycin variable dose per unstable renal function (pharmacist dosing)   Does not apply See admin instructions   Continuous Infusions: PRN Meds:.   Pearson Grippe  MD 04/02/2021, 10:14 AM

## 2021-04-02 NOTE — Progress Notes (Signed)
  Echocardiogram Echocardiogram Transesophageal has been performed.  Fidel Levy 04/02/2021, 2:36 PM

## 2021-04-03 ENCOUNTER — Encounter (HOSPITAL_COMMUNITY): Payer: Self-pay | Admitting: Cardiology

## 2021-04-03 LAB — CBC
HCT: 29.9 % — ABNORMAL LOW (ref 39.0–52.0)
Hemoglobin: 9.7 g/dL — ABNORMAL LOW (ref 13.0–17.0)
MCH: 32.3 pg (ref 26.0–34.0)
MCHC: 32.4 g/dL (ref 30.0–36.0)
MCV: 99.7 fL (ref 80.0–100.0)
Platelets: 259 10*3/uL (ref 150–400)
RBC: 3 MIL/uL — ABNORMAL LOW (ref 4.22–5.81)
RDW: 15.7 % — ABNORMAL HIGH (ref 11.5–15.5)
WBC: 12.2 10*3/uL — ABNORMAL HIGH (ref 4.0–10.5)
nRBC: 0.2 % (ref 0.0–0.2)

## 2021-04-03 LAB — RENAL FUNCTION PANEL
Albumin: 2.5 g/dL — ABNORMAL LOW (ref 3.5–5.0)
Anion gap: 12 (ref 5–15)
BUN: 19 mg/dL (ref 8–23)
CO2: 25 mmol/L (ref 22–32)
Calcium: 8.4 mg/dL — ABNORMAL LOW (ref 8.9–10.3)
Chloride: 97 mmol/L — ABNORMAL LOW (ref 98–111)
Creatinine, Ser: 6.52 mg/dL — ABNORMAL HIGH (ref 0.61–1.24)
GFR, Estimated: 8 mL/min — ABNORMAL LOW (ref >60)
Glucose, Bld: 118 mg/dL — ABNORMAL HIGH (ref 70–99)
Phosphorus: 3.4 mg/dL (ref 2.5–4.6)
Potassium: 3.9 mmol/L (ref 3.5–5.1)
Sodium: 134 mmol/L — ABNORMAL LOW (ref 135–145)

## 2021-04-03 LAB — GLUCOSE, CAPILLARY
Glucose-Capillary: 86 mg/dL (ref 70–99)
Glucose-Capillary: 92 mg/dL (ref 70–99)
Glucose-Capillary: 101 mg/dL — ABNORMAL HIGH (ref 70–99)

## 2021-04-03 MED ORDER — VANCOMYCIN HCL 750 MG/150ML IV SOLN: 750.0000 mg | INTRAVENOUS | Status: DC

## 2021-04-03 MED ORDER — VANCOMYCIN HCL 750 MG/150ML IV SOLN: 750.0000 mg | INTRAVENOUS | 0 refills | Status: AC

## 2021-04-03 MED ORDER — METOPROLOL TARTRATE 25 MG PO TABS: 12.5000 mg | ORAL_TABLET | Freq: Two times a day (BID) | ORAL | 0 refills | Status: DC

## 2021-04-03 NOTE — Progress Notes (Signed)
Nsg Discharge Note  Admit Date:  03/28/2021 Discharge date: 04/03/2021   William [name] to be D/C'd Home per MD order.  AVS completed.     Discharge Medication: Allergies as of 04/03/2021       Reactions   Carvedilol Other (See Comments)   Makes him feel like his pelvis is "grinding" (??) Patient doesn't recall this, however        Medication List     TAKE these medications    aspirin 81 MG chewable tablet Chew 1 tablet (81 mg total) by mouth daily.   atorvastatin 40 MG tablet Commonly known as: LIPITOR Take 1 tablet (40 mg total) by mouth daily.   calcitRIOL 0.25 MCG capsule Commonly known as: ROCALTROL Take 5 capsules (1.25 mcg total) by mouth every Monday, Wednesday, and Friday with hemodialysis.   famotidine 10 MG tablet Commonly known as: PEPCID Take 1 tablet (10 mg total) by mouth daily.   metoprolol tartrate 25 MG tablet Commonly known as: LOPRESSOR Take 0.5 tablets (12.5 mg total) by mouth 2 (two) times daily. What changed: how much to take   ondansetron 4 MG tablet Commonly known as: ZOFRAN Take 4 mg by mouth every 8 (eight) hours as needed for nausea or vomiting.   vancomycin 750 MG/150ML Soln Commonly known as: VANCOREADY Inject 150 mLs (750 mg total) into the vein every Monday, Wednesday, and Friday with hemodialysis for 10 days. Start taking on: April 04, 2021   vitamin C 1000 MG tablet Take 1 tablet by mouth daily. Notes to patient: Did not receive in hospital; resume as directed.               Durable Medical Equipment  (From admission, onward)           Start     Ordered   03/31/21 0843  For home use only DME 3 n 1  Once        03/31/21 0842            Discharge Assessment: Vitals:   04/03/21 1200 04/03/21 1550  BP: 112/64 132/73  Pulse: 60 60  Resp: 15 15  Temp:  97.9 F (36.6 C)  SpO2: 100% 100%   Skin clean, dry and intact without evidence of skin break down, no evidence of skin tears noted. IV catheter  discontinued intact. Site without signs and symptoms of complications - no redness or edema noted at insertion site, patient denies c/o pain - only slight tenderness at site.  Dressing with slight pressure applied.  D/c Instructions-Education: Discharge instructions given to patient and patient's daughter William [name] with verbalized understanding. D/c education completed with patient/family including follow up instructions, medication list, d/c activities limitations if indicated, with other d/c instructions as indicated by MD - patient able to verbalize understanding, all questions fully answered. Patient instructed to return to ED, call 911, or call MD for any changes in condition.  Patient escorted via [place], and D/C home via private auto.    Hiram Comber, RN 04/03/2021 5:26 PM

## 2021-04-03 NOTE — Progress Notes (Signed)
Occupational Therapy Treatment Patient Details Name: William [name] MRN: [id number] DOB: [birth year] Today's Date: 04/03/2021    History of present illness Pt is a 80 year old man admitted with N/V/D, generalized weakness and difficulty taking care of himself. He lives with his daughter and her children and walks with a cane at baseline. PMH: ESRD, PAD, PPM, anemia, sarcoidosis, DM2, CHF, HTN, CAD, SDU with craniotomy.   OT comments  Pt presented sitting at sink. Pt reported they did not to go walking all around right now. Pt was able to sit to stand transfer with supervision from low surface, ambulate with supervision at room level. Pt at this time reported they are happy to have [place] therapy when to return home as they were completing prior to coming into ED.     Follow Up Recommendations  Home health OT;Supervision/Assistance - 24 hour    Equipment Recommendations  3 in 1 bedside commode    Recommendations for Other Services      Precautions / Restrictions Precautions Precautions: Fall;Other (comment) Precaution Comments: R femoral tunneled catheter Restrictions Weight Bearing Restrictions: No       Mobility Bed Mobility Overal bed mobility:  (presented sitting)                  Transfers Overall transfer level: Needs assistance   Transfers: Sit to/from Stand Sit to Stand: Modified independent (Device/Increase time)              Balance                                           ADL either performed or assessed with clinical judgement   ADL Overall ADL's : Needs assistance/impaired Eating/Feeding: Independent   Grooming: Set up;Sitting   Upper Body Bathing: Set up;Sitting   Lower Body Bathing: Minimal assistance;Sit to/from stand   Upper Body Dressing : Minimal assistance;Sitting   Lower Body Dressing: Minimal assistance;Sit to/from stand   Toilet Transfer: Min guard;Ambulation   Toileting- Clothing Manipulation and Hygiene:  Min guard;Sit to/from stand         General ADL Comments: Pt requires supervision to min guard     Vision       Perception     Praxis      Cognition Arousal/Alertness: Awake/alert Behavior During Therapy: WFL for tasks assessed/performed                                 Problem Solving: Slow processing;Decreased initiation General Comments: Slow to respond;        Exercises     Shoulder Instructions       General Comments      Pertinent Vitals/ Pain          Home Living                                          Prior Functioning/Environment              Frequency  Min 2X/week        Progress Toward Goals  OT Goals(current goals can now be found in the care plan section)  Progress towards OT goals: Progressing toward goals  Acute Rehab OT Goals Patient  Stated Goal: return home OT Goal Formulation: With patient Time For Goal Achievement: 04/13/21 Potential to Achieve Goals: Good ADL Goals Pt Will Perform Grooming: with min guard assist;standing Pt Will Perform Lower Body Dressing: with min assist;sit to/from stand;with adaptive equipment Pt Will Transfer to Toilet: with min guard assist;stand pivot transfer;bedside commode Pt Will Perform Toileting - Clothing Manipulation and hygiene: with modified independence;sit to/from stand Additional ADL Goal #1: Pt will increase to minguardA for bed mobility as precursor for OOB ADL. Additional ADL Goal #2: Pt will increase to standing x4 mins for OOB ADL with 1 seated rest break.  Plan Discharge plan remains appropriate    Co-evaluation                 AM-PAC OT "6 Clicks" Daily Activity     Outcome Measure   Help from another person eating meals?: None Help from another person taking care of personal grooming?: A Little Help from another person toileting, which includes using toliet, bedpan, or urinal?: A Little   Help from another person to put on and taking  off regular upper body clothing?: A Little Help from another person to put on and taking off regular lower body clothing?: A Little 6 Click Score: 16    End of Session    OT Visit Diagnosis: Unsteadiness on feet (R26.81);Muscle weakness (generalized) (M62.81);Pain   Activity Tolerance Patient limited by fatigue   Patient Left in chair;with call bell/phone within reach   Nurse Communication          Time: [id number] OT Time Calculation (min): 19 min  Charges: OT General Charges $OT Visit: 1 Visit OT Treatments $Self Care/Home Management : 8-22 mins  Joeseph Amor OTR/L  Acute Rehab Services  [phone] office number [phone] pager number    Joeseph Amor 04/03/2021, 10:06 AM

## 2021-04-03 NOTE — Progress Notes (Addendum)
Family Medicine Teaching Service Daily Progress Note Intern Pager: [phone]  Patient name: William [name] Medical record number: [id number] Date of birth: 03-13-1941 Age: 80 y.o. Gender: male  Primary Care Provider: Matilde Haymaker, MD Consultants: Nephro Code Status: Full  Pt Overview and Major Events to Date:  6/17 Admitted  Assessment and Plan: William [name] is a 80 y.o. male presenting with increased weakness and found to meet sepsis criteria . PMH is significant for ESRD on HD MWF, CAD, HTN, DM,hx multiple falls, subdural hematoma, nonsustained v-tach s/p pacemaker.  Sepsis due to MRSE infection  BP greatly improved.  Spoke with Nephrology and fine with stopping Midodrine.  Bcx negative x48 hours.  WBC slightly elevated at 10.7 but likely due to dialysis.  Cleared by Infectious Disease with pna for IV Vanc for total of 2 weeks - D/C Midodrine - Continue IV Vanc with outpatient dialysis  Family Concern for early dementia Alert and Oriented x 3 but could not name month. - follow up with Neurology outpatient  FEN/GI:  PPx: Heparin Dispo:Home today. Barriers include Vanc outaptient w/ dialysis.   Subjective:  Patient is feeling well this morning.  No current symptoms.  Objective: Temp:  [97.7 F (36.5 C)-98.5 F (36.9 C)] 98.3 F (36.8 C) (06/23 1139) Pulse Rate:  [60-86] 60 (06/23 1200) Resp:  [14-26] 15 (06/23 1200) BP: (94-192)/(54-111) 112/64 (06/23 1200) SpO2:  [98 %-100 %] 100 % (06/23 1200) Weight:  [64.5 kg] 64.5 kg (06/23 0529) Physical Exam:  Physical Exam HENT:     Head: Normocephalic and atraumatic.     Mouth/Throat:     Mouth: Mucous membranes are moist.  Cardiovascular:     Rate and Rhythm: Normal rate and regular rhythm.  Pulmonary:     Effort: Pulmonary effort is normal.     Breath sounds: Normal breath sounds.  Abdominal:     General: Abdomen is flat.     Palpations: Abdomen is soft.  Musculoskeletal:     Right lower leg: No edema.      Left lower leg: No edema.  Skin:    General: Skin is warm.  Neurological:     Mental Status: He is alert.     Laboratory: Recent Labs  Lab 04/01/21 0435 04/02/21 0112 04/03/21 0112  WBC 10.7* 9.9 12.2*  HGB 9.6* 9.6* 9.7*  HCT 30.0* 29.5* 29.9*  PLT 308 295 259   Recent Labs  Lab 03/29/21 0258 03/29/21 2010 03/31/21 0203 04/01/21 0435 04/02/21 0112 04/03/21 0112  NA 135 131* 134* 136 133* 134*  K 4.2 3.7 3.7 4.1 4.0 3.9  CL 97* 94* 96* 95* 93* 97*  CO2 21* '22 23 24 26 25  '$ BUN 67* 74* 46* 22 28* 19  CREATININE 12.05* 12.57* 9.68* 6.58* 8.06* 6.52*  CALCIUM 8.5* 8.0* 8.2* 9.2 9.0 8.4*  PROT 6.1* 5.9* 5.6*  --   --   --   BILITOT 1.4* 1.0 1.0  --   --   --   ALKPHOS 162* 152* 124  --   --   --   ALT 118* 93* 65*  --   --   --   AST 41 26 18  --   --   --   GLUCOSE 86 132* 137* 101* 103* 118*    Imaging/Diagnostic Tests: No new imaging  Delora Fuel, MD 04/03/2021, 12:51 PM PGY-1, [place] Intern pager: [phone], text pages welcome

## 2021-04-03 NOTE — Progress Notes (Addendum)
[facility] Progress Note   Subjective:     Patient seen and examined at bedside-seen sitting up in chair. Reports feeling okay. Denies SOB, CP, palpitations, ABD pain, N/V/D. Patient remains afebrile. Tolerated yesterday's HD with net UF 2.5L. Patient reports he may go home today-plan for HD 6/24 if patient is still here.  Objective Vitals:   04/03/21 0529 04/03/21 0759 04/03/21 1049 04/03/21 1139  BP:  (!) 111/92 (!) 192/111 107/62  Pulse:  64 60 67  Resp:  '15 14 20  '$ Temp:  98.3 F (36.8 C) 97.7 F (36.5 C) 98.3 F (36.8 C)  TempSrc:  Oral Oral Oral  SpO2:  100% 100% 100%  Weight: 64.5 kg     Height:       Physical Exam General: Well appearing male; No acute distress Heart: RRR; no murmurs, gallops, or rubs Lungs: Clear throughout; No wheezing, rales, or rhonchi Abdomen: Soft, non-tender Extremities: No edema BLLE Dialysis Access:  R femoral [facility]   Filed Weights   04/02/21 0825 04/02/21 1130 04/03/21 0529  Weight: 64.6 kg 62 kg 64.5 kg    Intake/Output Summary (Last 24 hours) at 04/03/2021 1151 Last data filed at 04/03/2021 0646 Gross per 24 hour  Intake 195.83 ml  Output 100 ml  Net 95.83 ml    Additional Objective Labs: Basic Metabolic Panel: Recent Labs  Lab 04/01/21 0435 04/02/21 0112 04/03/21 0112  NA 136 133* 134*  K 4.1 4.0 3.9  CL 95* 93* 97*  CO2 '24 26 25  '$ GLUCOSE 101* 103* 118*  BUN 22 28* 19  CREATININE 6.58* 8.06* 6.52*  CALCIUM 9.2 9.0 8.4*  PHOS 4.3 3.9 3.4   Liver Function Tests: Recent Labs  Lab 03/29/21 0258 03/29/21 2010 03/31/21 0203 04/01/21 0435 04/02/21 0112 04/03/21 0112  AST 41 26 18  --   --   --   ALT 118* 93* 65*  --   --   --   ALKPHOS 162* 152* 124  --   --   --   BILITOT 1.4* 1.0 1.0  --   --   --   PROT 6.1* 5.9* 5.6*  --   --   --   ALBUMIN 2.5* 2.3* 2.2* 2.5* 2.5* 2.5*   Recent Labs  Lab 03/28/21 1554  LIPASE 61*   CBC: Recent Labs  Lab 03/28/21 1554 03/28/21 1638 03/29/21 0258  03/30/21 0104 03/31/21 0203 04/01/21 0435 04/02/21 0112 04/03/21 0112  WBC 14.0*  --  14.6* 13.3* 9.5 10.7* 9.9 12.2*  NEUTROABS 12.6*  --  11.6*  --   --   --   --   --   HGB 11.2*   < > 8.9* 9.1* 8.4* 9.6* 9.6* 9.7*  HCT 33.8*   < > 27.2* 27.3* 26.1* 30.0* 29.5* 29.9*  MCV 99.1  --  100.0 97.8 98.5 99.0 99.3 99.7  PLT 304  --  246 279 278 308 295 259   < > = values in this interval not displayed.   Blood Culture    Component Value Date/Time   SDES BLOOD LEFT HAND 04/01/2021 0439   SPECREQUEST  04/01/2021 0439    BOTTLES DRAWN AEROBIC AND ANAEROBIC Blood Culture adequate volume   CULT  04/01/2021 0439    NO GROWTH 2 DAYS Performed at [facility], [place] [address], [place], [place] [address]    REPTSTATUS PENDING 04/01/2021 [id number]    Cardiac Enzymes: No results for input(s): CKTOTAL, CKMB, CKMBINDEX, TROPONINI in the last  168 hours. CBG: Recent Labs  Lab 04/02/21 1456 04/02/21 1553 04/02/21 2041 04/03/21 0801 04/03/21 1141  GLUCAP 116* 101* 102* 86 92   Iron Studies: No results for input(s): IRON, TIBC, TRANSFERRIN, FERRITIN in the last 72 hours. Lab Results  Component Value Date   INR 1.3 (H) 03/29/2021   INR 1.2 03/28/2021   INR 1.2 03/26/2021   Studies/Results: ECHO TEE  Result Date: 04/02/2021    TRANSESOPHOGEAL ECHO REPORT   Patient Name:   William [name] Date of Exam: 04/02/2021 Medical Rec #:  [id number]         Height:       67.0 in Accession #:    [id number]        Weight:       139.8 lb Date of Birth:  1940/12/30         BSA:          1.737 m Patient Age:    80 years          BP:           132/80 mmHg Patient Gender: M                 HR:           85 bpm. Exam Location:  Inpatient Procedure: Transesophageal Echo and Color Doppler Indications:     Bacteremia  History:         Patient has prior history of Echocardiogram examinations, most                  recent 04/01/2021. CHF; Risk Factors:Dyslipidemia, Hypertension                  and Diabetes.   Sonographer:     Bernadene Person RDCS Referring Phys:  [id number] Leanor Kail Diagnosing Phys: Buford Dresser MD PROCEDURE: After discussion of the risks and benefits of a TEE, an informed consent was obtained from the patient. The transesophogeal probe was passed without difficulty through the esophogus of the patient. Sedation performed by different physician. The patient was monitored while under deep sedation. Anesthestetic sedation was provided intravenously by Anesthesiology: 188.'88mg'$  of Propofol, '60mg'$  of Lidocaine. Image quality was technically difficult. The patient's vital signs; including heart rate, blood pressure, and oxygen saturation; remained stable throughout the procedure. The patient developed no complications during the procedure. IMPRESSIONS  1. Left ventricular ejection fraction, by estimation, is 55 to 60%. The left ventricle has normal function.  2. Right ventricular systolic function is normal. The right ventricular size is normal.  3. No left atrial/left atrial appendage thrombus was detected.  4. The mitral valve is normal in structure. No evidence of mitral valve regurgitation. No evidence of mitral stenosis.  5. The aortic valve is tricuspid. Aortic valve regurgitation is not visualized. No aortic stenosis is present.  6. There is Moderate (Grade III) plaque involving the descending aorta. Conclusion(s)/Recommendation(s): No evidence of endocarditis. RA and RV pacer leads interrogated, no evidence of echodensities concerning for lead infection. FINDINGS  Left Ventricle: Left ventricular ejection fraction, by estimation, is 55 to 60%. The left ventricle has normal function. The left ventricular internal cavity size was normal in size. Right Ventricle: The right ventricular size is normal. No increase in right ventricular wall thickness. Right ventricular systolic function is normal. Left Atrium: Left atrial size was normal in size. No left atrial/left atrial appendage thrombus  was detected. Right Atrium: Right atrial size was normal in size. Pericardium: Trivial pericardial effusion  is present. Mitral Valve: The mitral valve is normal in structure. No evidence of mitral valve regurgitation. No evidence of mitral valve stenosis. There is no evidence of mitral valve vegetation. Tricuspid Valve: The tricuspid valve is normal in structure. Tricuspid valve regurgitation is mild . No evidence of tricuspid stenosis. There is no evidence of tricuspid valve vegetation. Aortic Valve: The aortic valve is tricuspid. Aortic valve regurgitation is not visualized. No aortic stenosis is present. There is no evidence of aortic valve vegetation. Pulmonic Valve: The pulmonic valve was grossly normal. Pulmonic valve regurgitation is trivial. No evidence of pulmonic stenosis. There is no evidence of pulmonic valve vegetation. Aorta: The aortic root is normal in size and structure. There is moderate (Grade III) plaque involving the descending aorta. IAS/Shunts: No atrial level shunt detected by color flow Doppler. Additional Comments: A device lead is visualized. Buford Dresser MD Electronically signed by Buford Dresser MD Signature Date/Time: 04/02/2021/7:04:45 PM    Final     Medications:   aspirin  81 mg Oral Daily   atorvastatin  40 mg Oral Daily   Chlorhexidine Gluconate Cloth  6 each Topical Q0600   darbepoetin (ARANESP) injection - DIALYSIS  60 mcg Intravenous Q Mon-HD   feeding supplement (NEPRO CARB STEADY)  237 mL Oral BID BM   heparin  5,000 Units Subcutaneous Q8H   metoprolol tartrate  12.5 mg Oral BID   midodrine  5 mg Oral TID WC   multivitamin  1 tablet Oral QHS   vancomycin variable dose per unstable renal function (pharmacist dosing)   Does not apply See admin instructions    Dialysis Orders: MWF at [place], 400/A1.5, EDW 60kg, 2K/2Ca, UFP #4, TDC, heparin 4000 - Venofer '50mg'$  IV weekly - Calcitriol 1.66mg PO q HD  Assessment/Plan: Sepsis/fever/weakness: WBC  slightly up today, f/u blood cxs 6/21 shows no growth X 2 days. 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 On abx at time of exchange - f/u blood cxs (-) X 2 days.  ESRD:  Usual MWF schedule - tolerated yesterday's HD with UF 2.5L-plan for HD tomorrow if patient is still here.  Hypotension/volume: Alittle over EDW-patient euvolemic on exam-had low Bps previously-metoprolol recently reduced. Also on midodrine-Bps seem improved now. Plan for HD 6/24 Anemia: Hgb 9.7 today - no overt blood loss-continue Aranesp  Metabolic bone disease: Ca ok, Phos pending. Resume home meds/binders.  Nutrition:  Alb low continue protein supplements.  T2DM  CAD, Hx VT, pacemaker  Transaminitis: Slow improvement over past few days  CTobie Poet NP [facility] 04/03/2021,11:51 AM  LOS: 6 days

## 2021-04-03 NOTE — Discharge Instructions (Addendum)
Dear William [name],   Thank you for letting us participate in your care! In this section, you will find a brief hospital admission summary of why you were admitted to the hospital, what happened during your admission, your diagnosis/diagnoses, and recommended follow up.  You were admitted because you were experiencing weakness.  Your testing revealed a bacterial infection.  You were diagnosed with a bacterial infection with your old catheter. You were treated with an antibiotic called Vancomycin.  You were also seen by Infectious Disease. They recommended continuing to receive Vancomycin after dialysis until 04/14/21.  Your infection improved and you were discharged from the hospital for meeting this goal.    POST-HOSPITAL & CARE INSTRUCTIONS Continue Vancomycin with Dialysis.  If you have any issues with this I recommend reaching out to [facility]. Please let PCP/Specialists know of any changes in medications that were made.  Please see medications section of this packet for any medication changes.   DOCTOR'S APPOINTMENTS & FOLLOW UP Future Appointments  Date Time Provider [place]  04/08/2021  8:50 AM ACCESS TO CARE POOL [facility] [place]  04/10/2021  9:15 AM Landis Martins, DPM [facility]  04/24/2021  3:30 PM Gusler, Christin Z, NP [facility] None  05/26/2021 12:15 PM [facility] DEVICE REMOTES [facility]  08/25/2021 12:15 PM [facility] DEVICE REMOTES [facility]  11/24/2021 12:15 PM [facility] DEVICE REMOTES [facility]   Neurology should call for appointment regarding memory concerns.  Thank you for choosing [facility]! Take care and be well!  [facility]  [place], [place] [address] [phone]

## 2021-04-03 NOTE — Progress Notes (Signed)
Physical Therapy Treatment Patient Details Name: William [name] MRN: [id number] DOB: October 01, 1941 Today's Date: 04/03/2021    History of Present Illness Pt is a 80 y.o. male admitted 03/28/21 with weakness, nausea/vomiting and difficulty caring for himself. Workup for sepsis due to MRSE infection. Family also with concern for early dementia. PMH includes ESRD (HD MWF), CAD, HTN, DM, SDH s/p craniotomy, nonsustained vtach s/p pacemaker, multiple falls.   PT Comments    Pt preparing for d/c home this afternoon. Pt tolerates brief bouts of transfer and gait training with SPC at [place]. Pt remains limited by generalized weakness, decreased activity tolerance and impaired cognition, including slowed processing, difficulty problem solving. Continue to recommend initial supervision from family for safety; if this is not a consistent option, pt may benefit from LTC/ALF for increased assist.   Follow Up Recommendations  Home health PT;Supervision/Assistance - 24 hour     Equipment Recommendations  3in1 (PT)    Recommendations for Other Services       Precautions / Restrictions Precautions Precautions: Fall;Other (comment) Precaution Comments: R femoral tunneled catheter Restrictions Weight Bearing Restrictions: No    Mobility  Bed Mobility               General bed mobility comments: Received sitting in recliner    Transfers Overall transfer level: Needs assistance Equipment used: Straight cane Transfers: Sit to/from Stand Sit to Stand: Supervision         General transfer comment: Reliant on momentum to power into standing, successful on second attepmt standing from recliner, supervision for safety  Ambulation/Gait Ambulation/Gait assistance: Supervision Gait Distance (Feet): 46 Feet Assistive device: Straight cane Gait Pattern/deviations: Step-through pattern;Decreased stride length;Trunk flexed Gait velocity: Decreased   General Gait Details: Slow,  guarded gait with SPC and supervision for safety; pt able to minimally increase gait speed with cues; no overt instability or LOB; endorses fatigue   Stairs Stairs:  (pt declined; educ on safety recommendations for stairs, including stand by assist from daughter)           Wheelchair Mobility    Modified Rankin (Stroke Patients Only)       Balance Overall balance assessment: Needs assistance Sitting-balance support: Feet supported Sitting balance-Leahy Scale: Good     Standing balance support: No upper extremity supported;During functional activity;Single extremity supported Standing balance-Leahy Scale: Fair Standing balance comment: can static stand and take steps without UE support                            Cognition Arousal/Alertness: Awake/alert Behavior During Therapy: WFL for tasks assessed/performed Overall Cognitive Status: No family/caregiver present to determine baseline cognitive functioning Area of Impairment: Attention;Following commands;Safety/judgement;Awareness;Problem solving                   Current Attention Level: Selective   Following Commands: Follows one step commands consistently;Follows one step commands with increased time Safety/Judgement: Decreased awareness of deficits Awareness: Emergent Problem Solving: Slow processing;Decreased initiation General Comments: Slow to answer questions and follow commands      Exercises      General Comments General comments (skin integrity, edema, etc.): VSS on RA      Pertinent Vitals/Pain Pain Assessment: No/denies pain    Home Living                      Prior Function            PT Goals (  current goals can now be found in the care plan section) Progress towards PT goals: Progressing toward goals    Frequency    Min 3X/week      PT Plan Current plan remains appropriate    Co-evaluation              AM-PAC PT "6 Clicks" Mobility   Outcome  Measure  Help needed turning from your back to your side while in a flat bed without using bedrails?: None Help needed moving from lying on your back to sitting on the side of a flat bed without using bedrails?: None Help needed moving to and from a bed to a chair (including a wheelchair)?: A Little Help needed standing up from a chair using your arms (e.g., wheelchair or bedside chair)?: A Little Help needed to walk in hospital room?: A Little Help needed climbing 3-5 steps with a railing? : A Little 6 Click Score: 20    End of Session   Activity Tolerance: Patient limited by fatigue Patient left:  (seated at sink waiting for RN) Nurse Communication: Mobility status PT Visit Diagnosis: Muscle weakness (generalized) (M62.81)     Time: [id number] PT Time Calculation (min) (ACUTE ONLY): 20 min  Charges:  $Therapeutic Activity: 8-22 mins                     Mabeline Caras, PT, DPT Acute Rehabilitation Services  Pager [phone] Office [place] 04/03/2021, 5:09 PM

## 2021-04-03 NOTE — TOC Initial Note (Signed)
Transition of Care [facility]) - Initial/Assessment Note    Patient Details  Name: William [name] MRN: [id number] Date of Birth: [birth year]  Transition of [facility]) CM/SW Contact:    Ninfa Meeker, RN Phone Number: 04/03/2021, 10:52 AM  Clinical Narrative:  Patient from home with his daughter.He resides on main level of the home.  CM spoke with patient's daughter, [name], concerning discharge needs. She states that patient was working with [facility] prior to admission and she wants them to have them again and would like same staff. CM called referral to Lattie Haw, Liaison with [facility], they will follow patient.    Expected Discharge Plan: [place] Barriers to Discharge: Continued Medical Work up   Patient Goals and CMS Choice     Choice offered to / list presented to : Adult Children  Expected Discharge Plan and Services Expected Discharge Plan: [place] In-house Referral: NA Discharge Planning Services: CM Consult Post Acute Care Choice: Durable Medical Equipment, Home Health Living arrangements for the past 2 months: [place]                 DME Arranged: 3-N-1 DME Agency: [employer] Date DME Agency Contacted: 04/03/21 Time DME Agency Contacted: [id number] Representative spoke with at DME Agency: Mardene Celeste HH Arranged: PT, OT, Nurse's Aide [place] Agency: [facility] Date [place]: 04/03/21 Time [place]: 9 Representative spoke with at [place]: [place] Arrangements/Services Living arrangements for the past 2 months: [place] with:: Adult Children Patient language and need for interpreter reviewed:: Yes Do you feel safe going back to the place where you live?: Yes      Need for Family Participation in Patient Care: Yes (Comment) Care giver support system in place?: Yes (comment)   Criminal Activity/Legal Involvement Pertinent to Current Situation/Hospitalization: No -  Comment as needed  Activities of Daily Living Home Assistive Devices/Equipment: Cane (specify quad or straight), Wheelchair, Environmental consultant (specify type), Eyeglasses, Dentures (specify type) ADL Screening (condition at time of admission) Patient's cognitive ability adequate to safely complete daily activities?: Yes Is the patient deaf or have difficulty hearing?: No Does the patient have difficulty seeing, even when wearing glasses/contacts?: No Does the patient have difficulty concentrating, remembering, or making decisions?: No Patient able to express need for assistance with ADLs?: Yes Does the patient have difficulty dressing or bathing?: Yes Independently performs ADLs?: Yes (appropriate for developmental age) Communication: Independent Dressing (OT): Needs assistance Is this a change from baseline?: Pre-admission baseline Grooming: Needs assistance Is this a change from baseline?: Pre-admission baseline Feeding: Independent Bathing: Needs assistance Is this a change from baseline?: Pre-admission baseline Toileting: Independent In/Out Bed: Needs assistance Is this a change from baseline?: Pre-admission baseline Walks in Home: Needs assistance Is this a change from baseline?: Pre-admission baseline Does the patient have difficulty walking or climbing stairs?: Yes Weakness of Legs: Both Weakness of Arms/Hands: Both  Permission Sought/Granted         Permission granted to share info w AGENCY: [place]        Emotional Assessment       Orientation: : Oriented to Self, Oriented to Place, Oriented to  Time, Oriented to Situation Alcohol / Substance Use: Not Applicable Psych Involvement: No (comment)  Admission diagnosis:  Sepsis [facility]) [A41.9] Patient Active Problem List   Diagnosis Date Noted   Bacteremia, coagulase-negative staphylococcal    Infection of hemodialysis tunneled catheter ([place]) 03/29/2021  Frailty    Sepsis ([place]) 03/28/2021   Elevated transaminase level  03/11/2021   General weakness 10/23/2020   Weakness 10/22/2020   Hypovitaminosis D 02/06/2020   PAD (peripheral artery disease) ([place]) 02/06/2020   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   Iron deficiency anemia, unspecified 11/30/2018   ESRD (end stage renal disease) on dialysis ([place]) 11/19/2018   Anemia in chronic kidney disease 123456   Chronic systolic (congestive) heart failure ([place]) 11/18/2018   Sarcoidosis, unspecified 11/18/2018   Secondary hyperparathyroidism of renal origin ([place]) 11/18/2018   Type 2 diabetes mellitus with diabetic peripheral angiopathy without gangrene ([place]) 11/18/2018   Ventricular tachycardia, non-sustained (HCC)    Decreased activities of daily living (ADL)    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[place])    Type 2 diabetes mellitus without complication, without long-term current use of insulin ([place]) 10/30/2017   Hypertension associated with diabetes ([place]) 10/30/2017   Hyperlipidemia associated with type 2 diabetes mellitus ([place]) 10/30/2017   CAD (coronary artery disease) 10/26/2014   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 [address] Phone: [phone] Fax: [phone]     Social Determinants of Health (SDOH) Interventions    Readmission Risk Interventions No flowsheet data found.

## 2021-04-03 NOTE — Progress Notes (Signed)
Renal Navigator received message from Dr. Moshe Cipro that patient may have transportation issues getting home tonight, but is willing to go straight to HD from the hospital tomorrow. Per Primary/Dr. Manus Rudd, patient is cleared for discharge today. Patient is well known to Navigator and Navigator met with patient at bedside to visit and discuss. He seems like he is not quite with it and says he does not have a ride home today, as his daughter has to "watch a baby, not her baby, and can't come get him." He also reports that no one other than Navigator has told him that he is being discharged today. He had already begun making plans for how he would arrange transportation for tomorrow. Navigator told him that we can get him home tonight and then he does not have to rearrange anything for his normal HD routine tomorrow. He asked that I call his daughters. He also asked me to teach him how to use his IPhone.  William [name] did not answer, but called back while I was on the phone with [facility]. Navigator explained above to [place] and she said, "I don't know why he would say that. I can come get him after I pick up my youngest child at 5:00pm. I'm just waiting for his nurse to tell me that he is ready to go." She also talked about worry of increased confused, she feels progressing on a daily basis, and that her father might not have enough support at home because she lives only adjacent to him, not with him. She states she works and has three children. She says, "I'm doing the best I can" but she is clearly worried about his safety, living as independently as he is. Navigator provided actively listening and suggests reaching out to his Primary Care office to see if she and her father can meet with a Education officer, museum to discuss ALF options for the future. Navigator also passed these concerns on to Dr. Manus Rudd, who states he can talk more with patient's daughter to make some recommendations for evaluations/follow up. Navigator is  appreciative.  Navigator updated bedside RN who has been in touch with patient's daughter also.   William [name], [place] Renal Navigator [phone]

## 2021-04-05 NOTE — Discharge Summary (Signed)
[facility] Discharge Summary  Patient name: William [name] Medical record number: [id number] Date of birth: August 16, 1941 Age: 80 y.o. Gender: male Date of Admission: 03/28/2021  Date of Discharge: 04/03/21 Admitting Physician: Blane Ohara McDiarmid, MD  Primary Care Provider: Matilde Haymaker, MD Consultants: Nephro, IR, ID  Indication for Hospitalization: Weakness  Discharge Diagnoses/Problem List:  ESRD on HD MWF, CAD, HTN, DM,hx multiple falls, subdural hematoma, nonsustained v-tach s/p pacemaker.  Disposition: Able to be discharged home safely  Discharge Condition: Stable  Discharge Exam:  Temp:  [97.7 F (36.5 C)-98.5 F (36.9 C)] 98.3 F (36.8 C) (06/23 1139) Pulse Rate:  [60-86] 60 (06/23 1200) Resp:  [14-26] 15 (06/23 1200) BP: (94-192)/(54-111) 112/64 (06/23 1200) SpO2:  [98 %-100 %] 100 % (06/23 1200) Weight:  [64.5 kg] 64.5 kg (06/23 0529) Physical Exam:   Physical Exam HENT:    Head: Normocephalic and atraumatic.    Mouth/Throat:    Mouth: Mucous membranes are moist. Cardiovascular:    Rate and Rhythm: Normal rate and regular rhythm. Pulmonary:    Effort: Pulmonary effort is normal.    Breath sounds: Normal breath sounds. Abdominal:    General: Abdomen is flat.    Palpations: Abdomen is soft. Musculoskeletal:    Right lower leg: No edema.    Left lower leg: No edema. Skin:    General: Skin is warm. Neurological:    Mental Status: He is alert.   Brief Hospital Course:  William [name] is a 80 y.o. male presenting with increased weakness and found to meet sepsis criteria . PMH is significant for ESRD on HD MWF, CAD, HTN, DM,hx multiple falls, subdural hematoma, nonsustained v-tach s/p pacemaker.  Severe sepsis Patient presented with weakness.  UA unremarkable. Urine culture and blood cultures obtained.  Started on Vancomycin and Cefepime.  Had negative imaging/UA.  Midordrine started due to Hypotension.  Continued Metop  Blood  culture came back positive for MRSE, indicating old catheter as likely source.  Found to be sensitive to Vanc.  Vancomycin dosed by Pharmacy.  Energy level returned to baseline and WBC elevation resolved. Became hemodynamically stable.  Midodrine discontinued and Metoprolol dose changed to 12.5 mg BID. Consulted ID.  Recommended continuing Vancomycin and obtaining follow-up blood cultures.  Also obtained TTE and TEE which both were negative for endocarditis.  ID cleared patient for discharge and spoke with Renal Coordinator who set-up Hemodialysis outptient with Vanomycin until 7/4.         ESRD on HD (MWF) Nephrology consulted.  IR consulted for cath replacement.  Catheter was replaced on 6/18.  Patient able to be restarted on dialysis and tolerated without issue.     Elevated Transaminases Noted upon admission.  Has had in past also with mild ascites.  No change on liver ultrasound.  Decreased by time of discharge.    All other problems chronic and stable  Issues for Follow Up:  MRSE- Ensure patient receiving Vancomycin at dialysis. Consider repeat LFT's Family expressed concern regarding memory problems, recommend referral to Neurology or [place] clinic for further cognitive testing.  Significant Procedures: TTE, TEE  Significant Labs and Imaging:  Recent Labs  Lab 04/01/21 0435 04/02/21 0112 04/03/21 0112  WBC 10.7* 9.9 12.2*  HGB 9.6* 9.6* 9.7*  HCT 30.0* 29.5* 29.9*  PLT 308 295 259   Recent Labs  Lab 03/29/21 2010 03/31/21 0203 04/01/21 0435 04/02/21 0112 04/03/21 0112  NA 131* 134* 136 133* 134*  K 3.7 3.7 4.1 4.0 3.9  CL 94* 96* 95* 93* 97*  CO2 '22 23 24 26 25  '$ GLUCOSE 132* 137* 101* 103* 118*  BUN 74* 46* 22 28* 19  CREATININE 12.57* 9.68* 6.58* 8.06* 6.52*  CALCIUM 8.0* 8.2* 9.2 9.0 8.4*  PHOS  --   --  4.3 3.9 3.4  ALKPHOS 152* 124  --   --   --   AST 26 18  --   --   --   ALT 93* 65*  --   --   --   ALBUMIN 2.3* 2.2* 2.5* 2.5* 2.5*   EXAM: ULTRASOUND  ABDOMEN LIMITED RIGHT UPPER QUADRANT   COMPARISON:  CT from earlier in the same day, ultrasound from 03/10/2021   FINDINGS: Gallbladder:   Gallbladder is decompressed similar to that seen on prior CT examination. Additionally the gallbladder wall thickening the 6 mm is noted with a negative sonographic Murphy's sign. These changes are consistent with the underlying ascites and similar to that seen on prior exam.   Common bile duct:   Diameter: 3.6 mm.   Liver:   No focal lesion identified. Within normal limits in parenchymal echogenicity. Portal vein is patent on color Doppler imaging with normal direction of blood flow towards the liver.   Other: Mild ascites is noted.   IMPRESSION: Mild ascites similar to that seen on prior CT examination.   Decompressed gallbladder with wall thickening likely related to the underlying ascites. These changes are similar to that seen on recent CT as well as on prior ultrasound.   No acute abnormality noted.  EXAM: CT ABDOMEN AND PELVIS WITHOUT CONTRAST   TECHNIQUE: Multidetector CT imaging of the abdomen and pelvis was performed following the standard protocol without IV contrast.   COMPARISON:  03/10/21   FINDINGS: Lower chest: Mild atelectatic changes are noted in the right lower lobe. No confluent infiltrate is seen.   Hepatobiliary: Liver is unremarkable. Gallbladder is decompressed. Pericholecystic inflammatory changes are noted likely related to the underlying ascites given the relative decompression of the gallbladder   Pancreas: Unremarkable. No pancreatic ductal dilatation or surrounding inflammatory changes.   Spleen: Normal in size without focal abnormality.   Adrenals/Urinary Tract: Adrenal glands are within normal limits. Renal vascular calcifications are seen. No renal calculi or obstructive changes are noted. The bladder is decompressed.   Stomach/Bowel: Scattered diverticular change of the colon is  noted without evidence of diverticulitis. The appendix is well visualized and within normal limits. Small bowel and stomach are unremarkable.   Vascular/Lymphatic: Aortic atherosclerosis. No enlarged abdominal or pelvic lymph nodes. Right femoral dialysis catheter is noted extending to the cavoatrial junction.   Reproductive: Prostate is unremarkable.   Other: Mild ascites is noted. This is slightly increased when compared with the prior study.   Musculoskeletal: Degenerative changes of lumbar spine are noted.   IMPRESSION: Mild ascites with some associated inflammatory changes of the gallbladder which are reactive in nature. This is similar to that seen on prior CT examination. The gallbladder is decompressed at this time however.   Diverticulosis without evidence of diverticulitis.   Normal-appearing appendix.   No acute abnormality is noted.     Electronically Signed   By: Inez Catalina M.D.   On: 03/28/2021 16:50     Results/Tests Pending at Time of Discharge: None  Discharge Medications:  Allergies as of 04/03/2021       Reactions   Carvedilol Other (See Comments)   Makes him feel like his pelvis is "grinding" (??) Patient doesn't  recall this, however        Medication List     TAKE these medications    aspirin 81 MG chewable tablet Chew 1 tablet (81 mg total) by mouth daily.   atorvastatin 40 MG tablet Commonly known as: LIPITOR Take 1 tablet (40 mg total) by mouth daily.   calcitRIOL 0.25 MCG capsule Commonly known as: ROCALTROL Take 5 capsules (1.25 mcg total) by mouth every Monday, Wednesday, and Friday with hemodialysis.   famotidine 10 MG tablet Commonly known as: PEPCID Take 1 tablet (10 mg total) by mouth daily.   metoprolol tartrate 25 MG tablet Commonly known as: LOPRESSOR Take 0.5 tablets (12.5 mg total) by mouth 2 (two) times daily. What changed: how much to take   ondansetron 4 MG tablet Commonly known as: ZOFRAN Take 4 mg by  mouth every 8 (eight) hours as needed for nausea or vomiting.   vancomycin 750 MG/150ML Soln Commonly known as: VANCOREADY Inject 150 mLs (750 mg total) into the vein every Monday, Wednesday, and Friday with hemodialysis for 10 days.   vitamin C 1000 MG tablet Take 1 tablet by mouth daily. Notes to patient: Did not receive in hospital; resume as directed.        Discharge Instructions: Please refer to Patient Instructions section of EMR for full details.  Patient was counseled important signs and symptoms that should prompt return to medical care, changes in medications, dietary instructions, activity restrictions, and follow up appointments.   Follow-Up Appointments:  [place].   Specialty: [place] Why: Someone from [place] will contact you to arrange start date and time for therapy Contact information: [place] [address] [place] Follow up.   Specialty: Family Medicine Contact information: [address] [id number] [place]                Delora Fuel, MD 04/05/2021, 5:24 PM PGY-1, [place]

## 2021-04-06 LAB — CULTURE, BLOOD (ROUTINE X 2)
Culture: NO GROWTH
Culture: NO GROWTH
Special Requests: ADEQUATE
Special Requests: ADEQUATE

## 2021-04-08 ENCOUNTER — Inpatient Hospital Stay: Payer: Medicare PPO

## 2021-04-08 DIAGNOSIS — R413 Other amnesia: Secondary | ICD-10-CM | POA: Insufficient documentation

## 2021-04-08 DIAGNOSIS — F0391 Unspecified dementia with behavioral disturbance: Secondary | ICD-10-CM | POA: Insufficient documentation

## 2021-04-08 DIAGNOSIS — F03918 Unspecified dementia, unspecified severity, with other behavioral disturbance: Secondary | ICD-10-CM | POA: Insufficient documentation

## 2021-04-08 NOTE — Assessment & Plan Note (Deleted)
Referred to Geriatric clinic with Dr Wendy Poet for further work up.

## 2021-04-08 NOTE — Progress Notes (Deleted)
     SUBJECTIVE:   CHIEF COMPLAINT / HPI:   William [name] is a 80 y.o. male presents for hospital follow up  Hospital follow up   Severe sepsis Patient admitted to hospital between 03/28/21 and 04/03/21 and treated for severe sepsis. Blood cx positive for MRSE, likely old catheter as the source. Initially started on Vancomycin and Cefipime. Vancomycin was continued after positive blood cultures. TEE and TTE both negative for endocarditis. Pt should contunue Vancomyin until 7/4. Pt has upcoming palliative appointment.  ESRD on HD Catheter changed as above on 6/18.   Elevated transaminases Pt noted to have mild ascites in hospital. No changes in liver US.       [place] Office Visit from 03/25/2021 in [facility]  PHQ-9 Total Score 9        Health Maintenance Due  Topic   FOOT EXAM    OPHTHALMOLOGY EXAM    URINE MICROALBUMIN    Zoster Vaccines- Shingrix (1 of 2)   PNA vac Low Risk Adult (2 of 2 - PCV13)   COVID-19 Vaccine (3 - Moderna risk series)      PERTINENT  PMH / PSH: PAD, DM, HLD, sarcoid   OBJECTIVE:   There were no vitals taken for this visit.   General: Alert, no acute distress Cardio: Normal S1 and S2, RRR, no r/m/g Pulm: CTAB, normal work of breathing Abdomen: Bowel sounds normal. Abdomen soft and non-tender.  Extremities: No peripheral edema.  Neuro: Cranial nerves grossly intact   ASSESSMENT/PLAN:   No problem-specific Assessment & Plan notes found for this encounter.     Lattie Haw, MD PGY-2 [place]

## 2021-04-08 NOTE — Progress Notes (Deleted)
    SUBJECTIVE:   CHIEF COMPLAINT / HPI:   Sepsis: vanco at dialysis  [place] clinic referral?   PERTINENT  PMH / PSH: ***  OBJECTIVE:   There were no vitals taken for this visit.  ***  ASSESSMENT/PLAN:   No problem-specific Assessment & Plan notes found for this encounter.     Benay Pike, MD [place]   {    This will disappear when note is signed, click to select method of visit    :1}

## 2021-04-09 ENCOUNTER — Inpatient Hospital Stay: Payer: Medicare PPO | Admitting: Family Medicine

## 2021-04-10 ENCOUNTER — Ambulatory Visit: Payer: Medicare PPO | Admitting: Sports Medicine

## 2021-04-11 DIAGNOSIS — N2581 Secondary hyperparathyroidism of renal origin: Secondary | ICD-10-CM | POA: Diagnosis not present

## 2021-04-11 DIAGNOSIS — Z992 Dependence on renal dialysis: Secondary | ICD-10-CM | POA: Diagnosis not present

## 2021-04-11 DIAGNOSIS — N186 End stage renal disease: Secondary | ICD-10-CM | POA: Diagnosis not present

## 2021-04-14 DIAGNOSIS — N2581 Secondary hyperparathyroidism of renal origin: Secondary | ICD-10-CM | POA: Diagnosis not present

## 2021-04-14 DIAGNOSIS — Z992 Dependence on renal dialysis: Secondary | ICD-10-CM | POA: Diagnosis not present

## 2021-04-14 DIAGNOSIS — N186 End stage renal disease: Secondary | ICD-10-CM | POA: Diagnosis not present

## 2021-04-16 DIAGNOSIS — N186 End stage renal disease: Secondary | ICD-10-CM | POA: Diagnosis not present

## 2021-04-16 DIAGNOSIS — Z992 Dependence on renal dialysis: Secondary | ICD-10-CM | POA: Diagnosis not present

## 2021-04-16 DIAGNOSIS — N2581 Secondary hyperparathyroidism of renal origin: Secondary | ICD-10-CM | POA: Diagnosis not present

## 2021-04-18 DIAGNOSIS — Z992 Dependence on renal dialysis: Secondary | ICD-10-CM | POA: Diagnosis not present

## 2021-04-18 DIAGNOSIS — N2581 Secondary hyperparathyroidism of renal origin: Secondary | ICD-10-CM | POA: Diagnosis not present

## 2021-04-18 DIAGNOSIS — N186 End stage renal disease: Secondary | ICD-10-CM | POA: Diagnosis not present

## 2021-04-19 DIAGNOSIS — B9562 Methicillin resistant Staphylococcus aureus infection as the cause of diseases classified elsewhere: Secondary | ICD-10-CM | POA: Diagnosis not present

## 2021-04-19 DIAGNOSIS — N186 End stage renal disease: Secondary | ICD-10-CM | POA: Diagnosis not present

## 2021-04-19 DIAGNOSIS — I132 Hypertensive heart and chronic kidney disease with heart failure and with stage 5 chronic kidney disease, or end stage renal disease: Secondary | ICD-10-CM | POA: Diagnosis not present

## 2021-04-19 DIAGNOSIS — I5022 Chronic systolic (congestive) heart failure: Secondary | ICD-10-CM | POA: Diagnosis not present

## 2021-04-19 DIAGNOSIS — E1122 Type 2 diabetes mellitus with diabetic chronic kidney disease: Secondary | ICD-10-CM | POA: Diagnosis not present

## 2021-04-19 DIAGNOSIS — D631 Anemia in chronic kidney disease: Secondary | ICD-10-CM | POA: Diagnosis not present

## 2021-04-19 DIAGNOSIS — E1151 Type 2 diabetes mellitus with diabetic peripheral angiopathy without gangrene: Secondary | ICD-10-CM | POA: Diagnosis not present

## 2021-04-19 DIAGNOSIS — B957 Other staphylococcus as the cause of diseases classified elsewhere: Secondary | ICD-10-CM | POA: Diagnosis not present

## 2021-04-19 DIAGNOSIS — T827XXD Infection and inflammatory reaction due to other cardiac and vascular devices, implants and grafts, subsequent encounter: Secondary | ICD-10-CM | POA: Diagnosis not present

## 2021-04-21 DIAGNOSIS — Z992 Dependence on renal dialysis: Secondary | ICD-10-CM | POA: Diagnosis not present

## 2021-04-21 DIAGNOSIS — N186 End stage renal disease: Secondary | ICD-10-CM | POA: Diagnosis not present

## 2021-04-21 DIAGNOSIS — N2581 Secondary hyperparathyroidism of renal origin: Secondary | ICD-10-CM | POA: Diagnosis not present

## 2021-04-23 DIAGNOSIS — N2581 Secondary hyperparathyroidism of renal origin: Secondary | ICD-10-CM | POA: Diagnosis not present

## 2021-04-23 DIAGNOSIS — Z992 Dependence on renal dialysis: Secondary | ICD-10-CM | POA: Diagnosis not present

## 2021-04-23 DIAGNOSIS — N186 End stage renal disease: Secondary | ICD-10-CM | POA: Diagnosis not present

## 2021-04-24 ENCOUNTER — Other Ambulatory Visit: Payer: Medicare PPO | Admitting: Nurse Practitioner

## 2021-04-24 ENCOUNTER — Other Ambulatory Visit: Payer: Self-pay

## 2021-04-24 DIAGNOSIS — T827XXD Infection and inflammatory reaction due to other cardiac and vascular devices, implants and grafts, subsequent encounter: Secondary | ICD-10-CM | POA: Diagnosis not present

## 2021-04-24 DIAGNOSIS — I5022 Chronic systolic (congestive) heart failure: Secondary | ICD-10-CM | POA: Diagnosis not present

## 2021-04-24 DIAGNOSIS — D631 Anemia in chronic kidney disease: Secondary | ICD-10-CM | POA: Diagnosis not present

## 2021-04-24 DIAGNOSIS — I132 Hypertensive heart and chronic kidney disease with heart failure and with stage 5 chronic kidney disease, or end stage renal disease: Secondary | ICD-10-CM | POA: Diagnosis not present

## 2021-04-24 DIAGNOSIS — B9562 Methicillin resistant Staphylococcus aureus infection as the cause of diseases classified elsewhere: Secondary | ICD-10-CM | POA: Diagnosis not present

## 2021-04-24 DIAGNOSIS — E1151 Type 2 diabetes mellitus with diabetic peripheral angiopathy without gangrene: Secondary | ICD-10-CM | POA: Diagnosis not present

## 2021-04-24 DIAGNOSIS — B957 Other staphylococcus as the cause of diseases classified elsewhere: Secondary | ICD-10-CM | POA: Diagnosis not present

## 2021-04-24 DIAGNOSIS — N186 End stage renal disease: Secondary | ICD-10-CM | POA: Diagnosis not present

## 2021-04-24 DIAGNOSIS — E1122 Type 2 diabetes mellitus with diabetic chronic kidney disease: Secondary | ICD-10-CM | POA: Diagnosis not present

## 2021-04-25 ENCOUNTER — Telehealth: Payer: Self-pay | Admitting: Nurse Practitioner

## 2021-04-25 DIAGNOSIS — Z992 Dependence on renal dialysis: Secondary | ICD-10-CM | POA: Diagnosis not present

## 2021-04-25 DIAGNOSIS — N186 End stage renal disease: Secondary | ICD-10-CM | POA: Diagnosis not present

## 2021-04-25 DIAGNOSIS — N2581 Secondary hyperparathyroidism of renal origin: Secondary | ICD-10-CM | POA: Diagnosis not present

## 2021-04-25 NOTE — Telephone Encounter (Signed)
Attempted to contact patient's daughter [name], to reschedule the Palliative Consult, no answer - left message requesting a return call today to reschedule.

## 2021-04-28 ENCOUNTER — Telehealth: Payer: Self-pay | Admitting: Nurse Practitioner

## 2021-04-28 DIAGNOSIS — N2581 Secondary hyperparathyroidism of renal origin: Secondary | ICD-10-CM | POA: Diagnosis not present

## 2021-04-28 DIAGNOSIS — N186 End stage renal disease: Secondary | ICD-10-CM | POA: Diagnosis not present

## 2021-04-28 DIAGNOSIS — Z992 Dependence on renal dialysis: Secondary | ICD-10-CM | POA: Diagnosis not present

## 2021-04-28 NOTE — Telephone Encounter (Signed)
I called William [name], Mr. Hession daughter to schedule PC f/u visit. No answer, message left with contact information

## 2021-04-29 DIAGNOSIS — B957 Other staphylococcus as the cause of diseases classified elsewhere: Secondary | ICD-10-CM | POA: Diagnosis not present

## 2021-04-29 DIAGNOSIS — I132 Hypertensive heart and chronic kidney disease with heart failure and with stage 5 chronic kidney disease, or end stage renal disease: Secondary | ICD-10-CM | POA: Diagnosis not present

## 2021-04-29 DIAGNOSIS — T827XXD Infection and inflammatory reaction due to other cardiac and vascular devices, implants and grafts, subsequent encounter: Secondary | ICD-10-CM | POA: Diagnosis not present

## 2021-04-29 DIAGNOSIS — I5022 Chronic systolic (congestive) heart failure: Secondary | ICD-10-CM | POA: Diagnosis not present

## 2021-04-29 DIAGNOSIS — E1122 Type 2 diabetes mellitus with diabetic chronic kidney disease: Secondary | ICD-10-CM | POA: Diagnosis not present

## 2021-04-29 DIAGNOSIS — D631 Anemia in chronic kidney disease: Secondary | ICD-10-CM | POA: Diagnosis not present

## 2021-04-29 DIAGNOSIS — E1151 Type 2 diabetes mellitus with diabetic peripheral angiopathy without gangrene: Secondary | ICD-10-CM | POA: Diagnosis not present

## 2021-04-29 DIAGNOSIS — B9562 Methicillin resistant Staphylococcus aureus infection as the cause of diseases classified elsewhere: Secondary | ICD-10-CM | POA: Diagnosis not present

## 2021-04-29 DIAGNOSIS — N186 End stage renal disease: Secondary | ICD-10-CM | POA: Diagnosis not present

## 2021-04-30 DIAGNOSIS — N2581 Secondary hyperparathyroidism of renal origin: Secondary | ICD-10-CM | POA: Diagnosis not present

## 2021-04-30 DIAGNOSIS — Z992 Dependence on renal dialysis: Secondary | ICD-10-CM | POA: Diagnosis not present

## 2021-04-30 DIAGNOSIS — N186 End stage renal disease: Secondary | ICD-10-CM | POA: Diagnosis not present

## 2021-05-01 DIAGNOSIS — E1122 Type 2 diabetes mellitus with diabetic chronic kidney disease: Secondary | ICD-10-CM | POA: Diagnosis not present

## 2021-05-01 DIAGNOSIS — D631 Anemia in chronic kidney disease: Secondary | ICD-10-CM | POA: Diagnosis not present

## 2021-05-01 DIAGNOSIS — B957 Other staphylococcus as the cause of diseases classified elsewhere: Secondary | ICD-10-CM | POA: Diagnosis not present

## 2021-05-01 DIAGNOSIS — T827XXD Infection and inflammatory reaction due to other cardiac and vascular devices, implants and grafts, subsequent encounter: Secondary | ICD-10-CM | POA: Diagnosis not present

## 2021-05-01 DIAGNOSIS — E1151 Type 2 diabetes mellitus with diabetic peripheral angiopathy without gangrene: Secondary | ICD-10-CM | POA: Diagnosis not present

## 2021-05-01 DIAGNOSIS — I5022 Chronic systolic (congestive) heart failure: Secondary | ICD-10-CM | POA: Diagnosis not present

## 2021-05-01 DIAGNOSIS — N186 End stage renal disease: Secondary | ICD-10-CM | POA: Diagnosis not present

## 2021-05-01 DIAGNOSIS — I132 Hypertensive heart and chronic kidney disease with heart failure and with stage 5 chronic kidney disease, or end stage renal disease: Secondary | ICD-10-CM | POA: Diagnosis not present

## 2021-05-01 DIAGNOSIS — B9562 Methicillin resistant Staphylococcus aureus infection as the cause of diseases classified elsewhere: Secondary | ICD-10-CM | POA: Diagnosis not present

## 2021-05-02 DIAGNOSIS — Z992 Dependence on renal dialysis: Secondary | ICD-10-CM | POA: Diagnosis not present

## 2021-05-02 DIAGNOSIS — N2581 Secondary hyperparathyroidism of renal origin: Secondary | ICD-10-CM | POA: Diagnosis not present

## 2021-05-02 DIAGNOSIS — N186 End stage renal disease: Secondary | ICD-10-CM | POA: Diagnosis not present

## 2021-05-05 DIAGNOSIS — Z992 Dependence on renal dialysis: Secondary | ICD-10-CM | POA: Diagnosis not present

## 2021-05-05 DIAGNOSIS — N2581 Secondary hyperparathyroidism of renal origin: Secondary | ICD-10-CM | POA: Diagnosis not present

## 2021-05-05 DIAGNOSIS — N186 End stage renal disease: Secondary | ICD-10-CM | POA: Diagnosis not present

## 2021-05-06 ENCOUNTER — Telehealth: Payer: Self-pay | Admitting: Nurse Practitioner

## 2021-05-06 NOTE — Telephone Encounter (Signed)
Attempted to contact patient's daughter, [name], to offer to reschedule the 04/24/21 Palliative f/u visit, no answer - left message requesting a return call to reschedule.

## 2021-05-07 DIAGNOSIS — N2581 Secondary hyperparathyroidism of renal origin: Secondary | ICD-10-CM | POA: Diagnosis not present

## 2021-05-07 DIAGNOSIS — Z992 Dependence on renal dialysis: Secondary | ICD-10-CM | POA: Diagnosis not present

## 2021-05-07 DIAGNOSIS — N186 End stage renal disease: Secondary | ICD-10-CM | POA: Diagnosis not present

## 2021-05-08 DIAGNOSIS — B957 Other staphylococcus as the cause of diseases classified elsewhere: Secondary | ICD-10-CM | POA: Diagnosis not present

## 2021-05-08 DIAGNOSIS — B9562 Methicillin resistant Staphylococcus aureus infection as the cause of diseases classified elsewhere: Secondary | ICD-10-CM | POA: Diagnosis not present

## 2021-05-08 DIAGNOSIS — I5022 Chronic systolic (congestive) heart failure: Secondary | ICD-10-CM | POA: Diagnosis not present

## 2021-05-08 DIAGNOSIS — N186 End stage renal disease: Secondary | ICD-10-CM | POA: Diagnosis not present

## 2021-05-08 DIAGNOSIS — D631 Anemia in chronic kidney disease: Secondary | ICD-10-CM | POA: Diagnosis not present

## 2021-05-08 DIAGNOSIS — E1122 Type 2 diabetes mellitus with diabetic chronic kidney disease: Secondary | ICD-10-CM | POA: Diagnosis not present

## 2021-05-08 DIAGNOSIS — T827XXD Infection and inflammatory reaction due to other cardiac and vascular devices, implants and grafts, subsequent encounter: Secondary | ICD-10-CM | POA: Diagnosis not present

## 2021-05-08 DIAGNOSIS — E1151 Type 2 diabetes mellitus with diabetic peripheral angiopathy without gangrene: Secondary | ICD-10-CM | POA: Diagnosis not present

## 2021-05-08 DIAGNOSIS — I132 Hypertensive heart and chronic kidney disease with heart failure and with stage 5 chronic kidney disease, or end stage renal disease: Secondary | ICD-10-CM | POA: Diagnosis not present

## 2021-05-09 DIAGNOSIS — N2581 Secondary hyperparathyroidism of renal origin: Secondary | ICD-10-CM | POA: Diagnosis not present

## 2021-05-09 DIAGNOSIS — Z992 Dependence on renal dialysis: Secondary | ICD-10-CM | POA: Diagnosis not present

## 2021-05-09 DIAGNOSIS — N186 End stage renal disease: Secondary | ICD-10-CM | POA: Diagnosis not present

## 2021-05-11 DIAGNOSIS — Z992 Dependence on renal dialysis: Secondary | ICD-10-CM | POA: Diagnosis not present

## 2021-05-11 DIAGNOSIS — I129 Hypertensive chronic kidney disease with stage 1 through stage 4 chronic kidney disease, or unspecified chronic kidney disease: Secondary | ICD-10-CM | POA: Diagnosis not present

## 2021-05-11 DIAGNOSIS — N186 End stage renal disease: Secondary | ICD-10-CM | POA: Diagnosis not present

## 2021-05-12 DIAGNOSIS — N2581 Secondary hyperparathyroidism of renal origin: Secondary | ICD-10-CM | POA: Diagnosis not present

## 2021-05-12 DIAGNOSIS — N186 End stage renal disease: Secondary | ICD-10-CM | POA: Diagnosis not present

## 2021-05-12 DIAGNOSIS — Z992 Dependence on renal dialysis: Secondary | ICD-10-CM | POA: Diagnosis not present

## 2021-05-13 ENCOUNTER — Other Ambulatory Visit: Payer: Self-pay

## 2021-05-13 ENCOUNTER — Ambulatory Visit (INDEPENDENT_AMBULATORY_CARE_PROVIDER_SITE_OTHER): Payer: Medicare PPO | Admitting: Family Medicine

## 2021-05-13 ENCOUNTER — Telehealth: Payer: Self-pay | Admitting: Nurse Practitioner

## 2021-05-13 DIAGNOSIS — E1151 Type 2 diabetes mellitus with diabetic peripheral angiopathy without gangrene: Secondary | ICD-10-CM | POA: Diagnosis not present

## 2021-05-13 DIAGNOSIS — A408 Other streptococcal sepsis: Secondary | ICD-10-CM | POA: Diagnosis not present

## 2021-05-13 DIAGNOSIS — I5022 Chronic systolic (congestive) heart failure: Secondary | ICD-10-CM | POA: Diagnosis not present

## 2021-05-13 DIAGNOSIS — B9562 Methicillin resistant Staphylococcus aureus infection as the cause of diseases classified elsewhere: Secondary | ICD-10-CM | POA: Diagnosis not present

## 2021-05-13 DIAGNOSIS — E1122 Type 2 diabetes mellitus with diabetic chronic kidney disease: Secondary | ICD-10-CM | POA: Diagnosis not present

## 2021-05-13 DIAGNOSIS — B957 Other staphylococcus as the cause of diseases classified elsewhere: Secondary | ICD-10-CM | POA: Diagnosis not present

## 2021-05-13 DIAGNOSIS — I132 Hypertensive heart and chronic kidney disease with heart failure and with stage 5 chronic kidney disease, or end stage renal disease: Secondary | ICD-10-CM | POA: Diagnosis not present

## 2021-05-13 DIAGNOSIS — R413 Other amnesia: Secondary | ICD-10-CM | POA: Diagnosis not present

## 2021-05-13 DIAGNOSIS — T827XXD Infection and inflammatory reaction due to other cardiac and vascular devices, implants and grafts, subsequent encounter: Secondary | ICD-10-CM | POA: Diagnosis not present

## 2021-05-13 DIAGNOSIS — N186 End stage renal disease: Secondary | ICD-10-CM | POA: Diagnosis not present

## 2021-05-13 DIAGNOSIS — D631 Anemia in chronic kidney disease: Secondary | ICD-10-CM | POA: Diagnosis not present

## 2021-05-13 NOTE — Telephone Encounter (Signed)
Spoke with patient's daughter, [name] to see if she wanted to schedule a Palliative f/u visit and daughter declined to schedule at this time, she requested that we call back in about a month.  I claarified with daughter that they do want to keep patient opened with [place] and she said Yes.

## 2021-05-13 NOTE — Patient Instructions (Signed)
It was great seeing you today!  Today we discussed your hospital follow up. Please let me know what medications you need refills for and I will send them to your desired pharmacy.   Please follow up at your next scheduled appointment in 1 month, if anything arises between now and then, please don't hesitate to contact our office.   Thank you for allowing [place] to be a part of your medical care!  Thank you, Dr. Larae Grooms

## 2021-05-13 NOTE — Assessment & Plan Note (Signed)
-  referral placed for Geriatric clinic -patient receiving home health services  -follow up as appropriate
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
## 2021-05-13 NOTE — Assessment & Plan Note (Signed)
-  resolved, completed vancomycin treatment

## 2021-05-14 ENCOUNTER — Other Ambulatory Visit: Payer: Self-pay | Admitting: *Deleted

## 2021-05-14 ENCOUNTER — Telehealth: Payer: Self-pay | Admitting: Nurse Practitioner

## 2021-05-14 DIAGNOSIS — N2581 Secondary hyperparathyroidism of renal origin: Secondary | ICD-10-CM | POA: Diagnosis not present

## 2021-05-14 DIAGNOSIS — Z992 Dependence on renal dialysis: Secondary | ICD-10-CM | POA: Diagnosis not present

## 2021-05-14 DIAGNOSIS — Z0189 Encounter for other specified special examinations: Secondary | ICD-10-CM

## 2021-05-14 DIAGNOSIS — N186 End stage renal disease: Secondary | ICD-10-CM | POA: Diagnosis not present

## 2021-05-14 MED ORDER — ATORVASTATIN CALCIUM 40 MG PO TABS: 40.0000 mg | ORAL_TABLET | Freq: Every day | ORAL | 2 refills | Status: AC

## 2021-05-14 MED ORDER — RENAL-VITE 0.8 MG PO TABS: 1.0000 | ORAL_TABLET | Freq: Every day | ORAL | 0 refills | Status: DC

## 2021-05-14 MED ORDER — METOPROLOL TARTRATE 25 MG PO TABS: 12.5000 mg | ORAL_TABLET | Freq: Two times a day (BID) | ORAL | 0 refills | Status: AC

## 2021-05-14 MED ORDER — ONDANSETRON HCL 4 MG PO TABS: 4.0000 mg | ORAL_TABLET | Freq: Three times a day (TID) | ORAL | 0 refills | Status: DC | PRN

## 2021-05-14 NOTE — Telephone Encounter (Signed)
Spoke with daughter, William [name], to make sure that she had the main Palliative contact number just in case they needed to call us if patient experiences any decline/changes prior to [place] calling them back and she did not.   Gave her our main Palliative number and also told her to choose Option #1 to speak with our Nurse Navigator or Option #3 to schedule an appointment and she was very appreciative of the information and call back.

## 2021-05-14 NOTE — Telephone Encounter (Signed)
I called to give daughter the geri appt information for Mr. Glancy and she requested that a few of his medications be refilled. Will forward to MD.  William [name]

## 2021-05-15 DIAGNOSIS — T827XXD Infection and inflammatory reaction due to other cardiac and vascular devices, implants and grafts, subsequent encounter: Secondary | ICD-10-CM | POA: Diagnosis not present

## 2021-05-15 DIAGNOSIS — D631 Anemia in chronic kidney disease: Secondary | ICD-10-CM | POA: Diagnosis not present

## 2021-05-15 DIAGNOSIS — E1122 Type 2 diabetes mellitus with diabetic chronic kidney disease: Secondary | ICD-10-CM | POA: Diagnosis not present

## 2021-05-15 DIAGNOSIS — B9562 Methicillin resistant Staphylococcus aureus infection as the cause of diseases classified elsewhere: Secondary | ICD-10-CM | POA: Diagnosis not present

## 2021-05-15 DIAGNOSIS — N186 End stage renal disease: Secondary | ICD-10-CM | POA: Diagnosis not present

## 2021-05-15 DIAGNOSIS — B957 Other staphylococcus as the cause of diseases classified elsewhere: Secondary | ICD-10-CM | POA: Diagnosis not present

## 2021-05-15 DIAGNOSIS — I132 Hypertensive heart and chronic kidney disease with heart failure and with stage 5 chronic kidney disease, or end stage renal disease: Secondary | ICD-10-CM | POA: Diagnosis not present

## 2021-05-15 DIAGNOSIS — I5022 Chronic systolic (congestive) heart failure: Secondary | ICD-10-CM | POA: Diagnosis not present

## 2021-05-15 DIAGNOSIS — E1151 Type 2 diabetes mellitus with diabetic peripheral angiopathy without gangrene: Secondary | ICD-10-CM | POA: Diagnosis not present

## 2021-05-16 ENCOUNTER — Telehealth: Payer: Self-pay | Admitting: *Deleted

## 2021-05-16 DIAGNOSIS — N186 End stage renal disease: Secondary | ICD-10-CM | POA: Diagnosis not present

## 2021-05-16 DIAGNOSIS — N2581 Secondary hyperparathyroidism of renal origin: Secondary | ICD-10-CM | POA: Diagnosis not present

## 2021-05-16 DIAGNOSIS — Z992 Dependence on renal dialysis: Secondary | ICD-10-CM | POA: Diagnosis not present

## 2021-05-16 NOTE — Chronic Care Management (AMB) (Signed)
  Care Management   Note  05/16/2021 Name: William [name] MRN: [id number] DOB: 05-14-1941  William [name] is a 80 y.o. year old male who is a primary care patient of William [name], Alana, DO. I reached out to William [name] by phone today in response to a referral sent by William [name]'s PCP, William Patience, DO.    [name] was given information about care management services today including:  Care management services include personalized support from designated clinical staff supervised by his physician, including individualized plan of care and coordination with other care providers 24/7 contact phone numbers for assistance for urgent and routine care needs. The patient may stop care management services at any time by phone call to the office staff.  William [name], William [name]  verbally agreed to assistance and services provided by embedded care coordination/care management team today.  Follow up plan: Telephone appointment with care management team member scheduled for:06/02/21  William [name]  Care Guide, Embedded Care Coordination [place]  Care Management  Direct Dial: [phone]

## 2021-05-19 DIAGNOSIS — N2581 Secondary hyperparathyroidism of renal origin: Secondary | ICD-10-CM | POA: Diagnosis not present

## 2021-05-19 DIAGNOSIS — N186 End stage renal disease: Secondary | ICD-10-CM | POA: Diagnosis not present

## 2021-05-19 DIAGNOSIS — Z992 Dependence on renal dialysis: Secondary | ICD-10-CM | POA: Diagnosis not present

## 2021-05-21 DIAGNOSIS — N2581 Secondary hyperparathyroidism of renal origin: Secondary | ICD-10-CM | POA: Diagnosis not present

## 2021-05-21 DIAGNOSIS — Z992 Dependence on renal dialysis: Secondary | ICD-10-CM | POA: Diagnosis not present

## 2021-05-21 DIAGNOSIS — N186 End stage renal disease: Secondary | ICD-10-CM | POA: Diagnosis not present

## 2021-05-22 DIAGNOSIS — I5022 Chronic systolic (congestive) heart failure: Secondary | ICD-10-CM | POA: Diagnosis not present

## 2021-05-22 DIAGNOSIS — T827XXD Infection and inflammatory reaction due to other cardiac and vascular devices, implants and grafts, subsequent encounter: Secondary | ICD-10-CM | POA: Diagnosis not present

## 2021-05-22 DIAGNOSIS — I132 Hypertensive heart and chronic kidney disease with heart failure and with stage 5 chronic kidney disease, or end stage renal disease: Secondary | ICD-10-CM | POA: Diagnosis not present

## 2021-05-22 DIAGNOSIS — B9562 Methicillin resistant Staphylococcus aureus infection as the cause of diseases classified elsewhere: Secondary | ICD-10-CM | POA: Diagnosis not present

## 2021-05-22 DIAGNOSIS — B957 Other staphylococcus as the cause of diseases classified elsewhere: Secondary | ICD-10-CM | POA: Diagnosis not present

## 2021-05-22 DIAGNOSIS — E1122 Type 2 diabetes mellitus with diabetic chronic kidney disease: Secondary | ICD-10-CM | POA: Diagnosis not present

## 2021-05-22 DIAGNOSIS — D631 Anemia in chronic kidney disease: Secondary | ICD-10-CM | POA: Diagnosis not present

## 2021-05-22 DIAGNOSIS — E1151 Type 2 diabetes mellitus with diabetic peripheral angiopathy without gangrene: Secondary | ICD-10-CM | POA: Diagnosis not present

## 2021-05-22 DIAGNOSIS — N186 End stage renal disease: Secondary | ICD-10-CM | POA: Diagnosis not present

## 2021-05-23 DIAGNOSIS — N186 End stage renal disease: Secondary | ICD-10-CM | POA: Diagnosis not present

## 2021-05-23 DIAGNOSIS — Z992 Dependence on renal dialysis: Secondary | ICD-10-CM | POA: Diagnosis not present

## 2021-05-23 DIAGNOSIS — N2581 Secondary hyperparathyroidism of renal origin: Secondary | ICD-10-CM | POA: Diagnosis not present

## 2021-05-26 ENCOUNTER — Ambulatory Visit (INDEPENDENT_AMBULATORY_CARE_PROVIDER_SITE_OTHER): Payer: Medicare PPO

## 2021-05-26 DIAGNOSIS — I441 Atrioventricular block, second degree: Secondary | ICD-10-CM | POA: Diagnosis not present

## 2021-05-26 DIAGNOSIS — N186 End stage renal disease: Secondary | ICD-10-CM | POA: Diagnosis not present

## 2021-05-26 DIAGNOSIS — Z992 Dependence on renal dialysis: Secondary | ICD-10-CM | POA: Diagnosis not present

## 2021-05-26 DIAGNOSIS — N2581 Secondary hyperparathyroidism of renal origin: Secondary | ICD-10-CM | POA: Diagnosis not present

## 2021-05-27 ENCOUNTER — Telehealth: Payer: Self-pay | Admitting: *Deleted

## 2021-05-27 NOTE — Chronic Care Management (AMB) (Signed)
  Care Management   Note  05/27/2021 Name: William [name] MRN: [id number] DOB: Oct 29, 1940  William [name] is a 80 y.o. year old male who is a primary care patient of Lilland, Alana, DO and is actively engaged with the care management team. I reached out to [name] by phone today to assist with re-scheduling an initial visit with the Licensed Clinical Social Worker  Follow up plan: Unsuccessful telephone outreach attempt made. A HIPAA compliant phone message was left for the patient providing contact information and requesting a return call.  The care management team will reach out to the patient again over the next 7 days.  If patient returns call to provider office, please advise to call [place] at [phone].  [place] Management  Direct Dial: [phone]

## 2021-05-28 DIAGNOSIS — N186 End stage renal disease: Secondary | ICD-10-CM | POA: Diagnosis not present

## 2021-05-28 DIAGNOSIS — Z992 Dependence on renal dialysis: Secondary | ICD-10-CM | POA: Diagnosis not present

## 2021-05-28 DIAGNOSIS — N2581 Secondary hyperparathyroidism of renal origin: Secondary | ICD-10-CM | POA: Diagnosis not present

## 2021-05-29 NOTE — Chronic Care Management (AMB) (Signed)
  Care Management   Note  05/29/2021 Name: William [name] MRN: [id number] DOB: 05-Apr-1941  [name] is a 80 y.o. year old male who is a primary care patient of Lilland, Alana, DO and is actively engaged with the care management team. I reached out to [name] by phone today to assist with re-scheduling an initial visit with the Licensed Clinical Social Worker  Follow up plan: Telephone appointment with care management team member scheduled for:06/02/21  Rhodesia Stanger  Care Guide, Embedded Care Coordination [place]  Care Management  Direct Dial: [phone]

## 2021-05-30 DIAGNOSIS — Z992 Dependence on renal dialysis: Secondary | ICD-10-CM | POA: Diagnosis not present

## 2021-05-30 DIAGNOSIS — N186 End stage renal disease: Secondary | ICD-10-CM | POA: Diagnosis not present

## 2021-05-30 DIAGNOSIS — N2581 Secondary hyperparathyroidism of renal origin: Secondary | ICD-10-CM | POA: Diagnosis not present

## 2021-05-30 LAB — CUP PACEART REMOTE DEVICE CHECK
Battery Impedance: 1443 Ohm
Battery Remaining Longevity: 43 mo
Battery Voltage: 2.76 V
Brady Statistic AP VP Percent: 9 %
Brady Statistic AP VS Percent: 0 %
Brady Statistic AS VP Percent: 91 %
Brady Statistic AS VS Percent: 0 %
Date Time Interrogation Session: 20220818144639
Implantable Lead Implant Date: 20060418
Implantable Lead Implant Date: 20060418
Implantable Lead Location: 753859
Implantable Lead Location: 753860
Implantable Lead Model: 5076
Implantable Lead Model: 5076
Implantable Pulse Generator Implant Date: 20060418
Lead Channel Impedance Value: 389 Ohm
Lead Channel Impedance Value: 440 Ohm
Lead Channel Pacing Threshold Amplitude: 0.625 V
Lead Channel Pacing Threshold Amplitude: 0.625 V
Lead Channel Pacing Threshold Pulse Width: 0.4 ms
Lead Channel Pacing Threshold Pulse Width: 0.4 ms
Lead Channel Setting Pacing Amplitude: 1.5 V
Lead Channel Setting Pacing Amplitude: 2 V
Lead Channel Setting Pacing Pulse Width: 0.4 ms
Lead Channel Setting Sensing Sensitivity: 4 mV

## 2021-06-02 ENCOUNTER — Ambulatory Visit: Payer: Medicare PPO | Admitting: Licensed Clinical Social Worker

## 2021-06-02 ENCOUNTER — Telehealth: Payer: Medicare PPO

## 2021-06-02 DIAGNOSIS — Z992 Dependence on renal dialysis: Secondary | ICD-10-CM | POA: Diagnosis not present

## 2021-06-02 DIAGNOSIS — N186 End stage renal disease: Secondary | ICD-10-CM | POA: Diagnosis not present

## 2021-06-02 DIAGNOSIS — Z139 Encounter for screening, unspecified: Secondary | ICD-10-CM

## 2021-06-02 DIAGNOSIS — Z0189 Encounter for other specified special examinations: Secondary | ICD-10-CM

## 2021-06-02 DIAGNOSIS — N2581 Secondary hyperparathyroidism of renal origin: Secondary | ICD-10-CM | POA: Diagnosis not present

## 2021-06-02 NOTE — Chronic Care Management (AMB) (Addendum)
Care Management Clinical Social Work  [facility] Psychosocial    06/02/2021 Name: William [name] MRN: [id number] DOB: [birth year]  William [name] is a 80 y.o. year old male who sees William [name], William Goodell, DO for primary care.    Collaboration with patient' daughter William [name]  for initial visit in response to a referral from Dr. McDiarmid for social work care coordination services to assess needs, support and barriers to care prior to [place] clinic appointment  Consent to Services:  [name] was given information about Care Management services today including:  Care Management services includes personalized support from designated clinical staff supervised by his physician, including individualized plan of care and coordination with other care providers 24/7 contact phone numbers for assistance for urgent and routine care needs. The patient may stop case management services at any time by phone call to the office staff.  Patient's daughter agreed to services and consent obtained.    (Look under patient's history for social information from assessment) Assessment:Patient's daugher William [name] provided all information during this encounter. States she is HCPOA and will provide document at [facility] appointment. Reviewed Geri clinic process with daughter, asked her to bring all of patient's medications.   Currently has PT , OT and RN.  Daughter would like an Aide however patient does not have medicaid; discussed private pay, this is not an option. Uses [employer] transportation; has meals on wheels and receives [facility] . No Care Plan or goals established   Recent life changes /stressors: decline in memory, confusion, forgetfulness that comes and goes for the past 2 years.; frequent falls;  Per daughter patient still drives short distances, they have discussed taking his keys;  Recommendation: Patient may benefit from, and daughter is in agreement to discuss the aide the [place] agency to get order from  PCP.  LCSW will also send message to PCP.  Intervention: Provided education and assistance to client regarding Advanced Directives. Provided education to patient/caregiver regarding level of care options. Caregiver stress acknowledged , Discussed [place] , and Collaborated with PCP for [facility] aide    Follow up Plan: No follow up scheduled with CCM team at this time. Will follow up with patient's daughter in 30 days if needed after Geri clini .Patient will call office if needed prior to next encounter. Patient may benefit from and daughter is in agreement for CCM LCSW to remain part of care team for the next 90 days.  If no needs are identified in the next 90 days, CCM LCSW will disconnect from the care team.   Review of patient past medical history, allergies, medications, and health status, including review of relevant consultants reports was performed today as part of a comprehensive evaluation and provision of chronic care management and care coordination services.  SDOH(Social Determinants of Health) assessments and interventions performed:    Advanced Directives Status:  per daughter patient has, she will bring it to [facility] appointment 06/12/2021  Care Plan  Conditions to be addressed/monitored:  memory concerns ;   There are no care plans that you recently modified to display for this patient.   Allergies  Allergen Reactions   Carvedilol Other (See Comments)    Makes him feel like his pelvis is "grinding" (??) Patient doesn't recall this, however   Outpatient Encounter Medications as of 06/02/2021  Medication Sig   Ascorbic Acid (VITAMIN C) 1000 MG tablet Take 1 tablet by mouth daily.   aspirin 81 MG chewable tablet Chew 1 tablet (  81 mg total) by mouth daily.   atorvastatin (LIPITOR) 40 MG tablet Take 1 tablet (40 mg total) by mouth daily.   B Complex-C-Folic Acid (RENAL-VITE) 0.8 MG TABS Take 1 tablet by mouth daily.   calcitRIOL (ROCALTROL) 0.25 MCG capsule Take 5  capsules (1.25 mcg total) by mouth every Monday, Wednesday, and Friday with hemodialysis.   famotidine (PEPCID) 10 MG tablet Take 1 tablet (10 mg total) by mouth daily.   metoprolol tartrate (LOPRESSOR) 25 MG tablet Take 0.5 tablets (12.5 mg total) by mouth 2 (two) times daily.   ondansetron (ZOFRAN) 4 MG tablet Take 1 tablet (4 mg total) by mouth every 8 (eight) hours as needed for nausea or vomiting.   No facility-administered encounter medications on file as of 06/02/2021.   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   Iron deficiency anemia, unspecified 11/30/2018   ESRD (end stage renal disease) on dialysis ([place]) 11/19/2018   Anemia in chronic kidney disease 123456   Chronic systolic (congestive) heart failure ([place]) 11/18/2018   Sarcoidosis, unspecified 11/18/2018   Secondary hyperparathyroidism of renal origin ([place]) 11/18/2018   Type 2 diabetes mellitus with diabetic peripheral angiopathy without gangrene ([place]) 11/18/2018   Ventricular tachycardia, non-sustained (HCC)    Decreased activities of daily living (ADL)    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

## 2021-06-02 NOTE — Patient Instructions (Addendum)
Licensed Clinical Social Worker Visit Information  No care plan established during this visit  Mr. Viele was given information about Care Management services today including:  Care Management services include personalized support from designated clinical staff supervised by his physician, including individualized plan of care and coordination with other care providers 24/7 contact phone numbers for assistance for urgent and routine care needs. The patient may stop Care Management services at any time by phone call to the office staff.   Patient's daugher verbally agreed to assistance and services provided by embedded care coordination/care management team today.  It was a pleasure speaking with your daughter today. Please remember to bring the [place]  Please call the office if needed No follow up scheduled, per our conversation I will contact you in 30 days if needed after the [facility] clinic appointment.  Patient's daughter verbalizes understanding of instructions provided today.   Casimer Lanius, LCSW Care Management & Coordination  [phone]

## 2021-06-03 DIAGNOSIS — E1151 Type 2 diabetes mellitus with diabetic peripheral angiopathy without gangrene: Secondary | ICD-10-CM | POA: Diagnosis not present

## 2021-06-03 DIAGNOSIS — I132 Hypertensive heart and chronic kidney disease with heart failure and with stage 5 chronic kidney disease, or end stage renal disease: Secondary | ICD-10-CM | POA: Diagnosis not present

## 2021-06-03 DIAGNOSIS — N186 End stage renal disease: Secondary | ICD-10-CM | POA: Diagnosis not present

## 2021-06-03 DIAGNOSIS — D631 Anemia in chronic kidney disease: Secondary | ICD-10-CM | POA: Diagnosis not present

## 2021-06-03 DIAGNOSIS — B9562 Methicillin resistant Staphylococcus aureus infection as the cause of diseases classified elsewhere: Secondary | ICD-10-CM | POA: Diagnosis not present

## 2021-06-03 DIAGNOSIS — E1122 Type 2 diabetes mellitus with diabetic chronic kidney disease: Secondary | ICD-10-CM | POA: Diagnosis not present

## 2021-06-03 DIAGNOSIS — T827XXD Infection and inflammatory reaction due to other cardiac and vascular devices, implants and grafts, subsequent encounter: Secondary | ICD-10-CM | POA: Diagnosis not present

## 2021-06-03 DIAGNOSIS — B957 Other staphylococcus as the cause of diseases classified elsewhere: Secondary | ICD-10-CM | POA: Diagnosis not present

## 2021-06-03 DIAGNOSIS — I5022 Chronic systolic (congestive) heart failure: Secondary | ICD-10-CM | POA: Diagnosis not present

## 2021-06-04 DIAGNOSIS — Z992 Dependence on renal dialysis: Secondary | ICD-10-CM | POA: Diagnosis not present

## 2021-06-04 DIAGNOSIS — N2581 Secondary hyperparathyroidism of renal origin: Secondary | ICD-10-CM | POA: Diagnosis not present

## 2021-06-04 DIAGNOSIS — N186 End stage renal disease: Secondary | ICD-10-CM | POA: Diagnosis not present

## 2021-06-06 DIAGNOSIS — N186 End stage renal disease: Secondary | ICD-10-CM | POA: Diagnosis not present

## 2021-06-06 DIAGNOSIS — N2581 Secondary hyperparathyroidism of renal origin: Secondary | ICD-10-CM | POA: Diagnosis not present

## 2021-06-06 DIAGNOSIS — Z992 Dependence on renal dialysis: Secondary | ICD-10-CM | POA: Diagnosis not present

## 2021-06-09 ENCOUNTER — Other Ambulatory Visit: Payer: Self-pay | Admitting: Family Medicine

## 2021-06-09 DIAGNOSIS — N2581 Secondary hyperparathyroidism of renal origin: Secondary | ICD-10-CM | POA: Diagnosis not present

## 2021-06-09 DIAGNOSIS — N186 End stage renal disease: Secondary | ICD-10-CM

## 2021-06-09 DIAGNOSIS — Z992 Dependence on renal dialysis: Secondary | ICD-10-CM

## 2021-06-09 DIAGNOSIS — R413 Other amnesia: Secondary | ICD-10-CM

## 2021-06-10 DIAGNOSIS — E1122 Type 2 diabetes mellitus with diabetic chronic kidney disease: Secondary | ICD-10-CM | POA: Diagnosis not present

## 2021-06-10 DIAGNOSIS — E1151 Type 2 diabetes mellitus with diabetic peripheral angiopathy without gangrene: Secondary | ICD-10-CM | POA: Diagnosis not present

## 2021-06-10 DIAGNOSIS — T827XXD Infection and inflammatory reaction due to other cardiac and vascular devices, implants and grafts, subsequent encounter: Secondary | ICD-10-CM | POA: Diagnosis not present

## 2021-06-10 DIAGNOSIS — D631 Anemia in chronic kidney disease: Secondary | ICD-10-CM | POA: Diagnosis not present

## 2021-06-10 DIAGNOSIS — I132 Hypertensive heart and chronic kidney disease with heart failure and with stage 5 chronic kidney disease, or end stage renal disease: Secondary | ICD-10-CM | POA: Diagnosis not present

## 2021-06-10 DIAGNOSIS — I5022 Chronic systolic (congestive) heart failure: Secondary | ICD-10-CM | POA: Diagnosis not present

## 2021-06-10 DIAGNOSIS — B957 Other staphylococcus as the cause of diseases classified elsewhere: Secondary | ICD-10-CM | POA: Diagnosis not present

## 2021-06-10 DIAGNOSIS — N186 End stage renal disease: Secondary | ICD-10-CM | POA: Diagnosis not present

## 2021-06-10 DIAGNOSIS — B9562 Methicillin resistant Staphylococcus aureus infection as the cause of diseases classified elsewhere: Secondary | ICD-10-CM | POA: Diagnosis not present

## 2021-06-11 DIAGNOSIS — I129 Hypertensive chronic kidney disease with stage 1 through stage 4 chronic kidney disease, or unspecified chronic kidney disease: Secondary | ICD-10-CM | POA: Diagnosis not present

## 2021-06-11 DIAGNOSIS — Z992 Dependence on renal dialysis: Secondary | ICD-10-CM | POA: Diagnosis not present

## 2021-06-11 DIAGNOSIS — N2581 Secondary hyperparathyroidism of renal origin: Secondary | ICD-10-CM | POA: Diagnosis not present

## 2021-06-11 DIAGNOSIS — N186 End stage renal disease: Secondary | ICD-10-CM | POA: Diagnosis not present

## 2021-06-12 ENCOUNTER — Ambulatory Visit (INDEPENDENT_AMBULATORY_CARE_PROVIDER_SITE_OTHER): Payer: Medicare PPO | Admitting: Family Medicine

## 2021-06-12 ENCOUNTER — Other Ambulatory Visit: Payer: Self-pay

## 2021-06-12 VITALS — BP 114/58 | HR 69 | Ht 67.0 in | Wt 152.1 lb

## 2021-06-12 DIAGNOSIS — Z794 Long term (current) use of insulin: Secondary | ICD-10-CM | POA: Diagnosis not present

## 2021-06-12 DIAGNOSIS — R94120 Abnormal auditory function study: Secondary | ICD-10-CM

## 2021-06-12 DIAGNOSIS — R296 Repeated falls: Secondary | ICD-10-CM | POA: Diagnosis not present

## 2021-06-12 DIAGNOSIS — Z992 Dependence on renal dialysis: Secondary | ICD-10-CM

## 2021-06-12 DIAGNOSIS — R2689 Other abnormalities of gait and mobility: Secondary | ICD-10-CM | POA: Diagnosis not present

## 2021-06-12 DIAGNOSIS — E1151 Type 2 diabetes mellitus with diabetic peripheral angiopathy without gangrene: Secondary | ICD-10-CM | POA: Diagnosis not present

## 2021-06-12 DIAGNOSIS — Z950 Presence of cardiac pacemaker: Secondary | ICD-10-CM | POA: Diagnosis not present

## 2021-06-12 DIAGNOSIS — I152 Hypertension secondary to endocrine disorders: Secondary | ICD-10-CM

## 2021-06-12 DIAGNOSIS — E1159 Type 2 diabetes mellitus with other circulatory complications: Secondary | ICD-10-CM | POA: Diagnosis not present

## 2021-06-12 DIAGNOSIS — Z789 Other specified health status: Secondary | ICD-10-CM

## 2021-06-12 DIAGNOSIS — F0391 Unspecified dementia with behavioral disturbance: Secondary | ICD-10-CM | POA: Diagnosis not present

## 2021-06-12 DIAGNOSIS — R413 Other amnesia: Secondary | ICD-10-CM

## 2021-06-12 HISTORY — DX: Other specified health status: Z78.9

## 2021-06-12 LAB — POCT GLYCOSYLATED HEMOGLOBIN (HGB A1C): HbA1c, POC (controlled diabetic range): 5.5 % (ref 0.0–7.0)

## 2021-06-12 NOTE — Progress Notes (Signed)
Pharmacy consulted to complete medication reconciliation for geriatric clinic. Patient arrives in good spirits accompanied by his daughter. Medication reconciliation completed by patient and medications, allergies, and preferred pharmacy are now up to date.   Medication Issues Identified: 1. Famotidine (Pepcid) is currently ordered as 10 mg daily. Max dose in patients with ESRD on HD is 10 mg on dialysis days (MWF after dialysis). However, he is currently only taking this as needed, not daily.  2. Health maintenance: Patient is due for the following vaccinations: Shingrix, COVID booster, Prevnar 20.   Plan: 1. Recommend taking no more than famotidine (Pepcid) 10 mg MWF after HD. He can continue taking this just when needed as he is doing now.  2. Recommend getting patient up to date on Shingrix, COVID, and Prevnar 20 vaccines.   Patient seen by: Meyer Russel, PharmD Candidate, Steffanie Dunn, PharmD - PGY-1 Pharmacy Resident, and Rebbeca Paul, PharmD - PGY2 Pharmacy Resident.

## 2021-06-13 DIAGNOSIS — R94120 Abnormal auditory function study: Secondary | ICD-10-CM | POA: Insufficient documentation

## 2021-06-13 DIAGNOSIS — Z992 Dependence on renal dialysis: Secondary | ICD-10-CM | POA: Insufficient documentation

## 2021-06-13 DIAGNOSIS — R2689 Other abnormalities of gait and mobility: Secondary | ICD-10-CM | POA: Insufficient documentation

## 2021-06-13 DIAGNOSIS — N2581 Secondary hyperparathyroidism of renal origin: Secondary | ICD-10-CM | POA: Diagnosis not present

## 2021-06-13 DIAGNOSIS — N186 End stage renal disease: Secondary | ICD-10-CM | POA: Diagnosis not present

## 2021-06-13 DIAGNOSIS — R296 Repeated falls: Secondary | ICD-10-CM | POA: Insufficient documentation

## 2021-06-13 NOTE — Assessment & Plan Note (Signed)
Lab Results  Component Value Date   HGBA1C 5.5 06/12/2021   Lifestyle controlled Continue Lifestyle

## 2021-06-13 NOTE — Assessment & Plan Note (Signed)
Established problem Well Controlled. Partial resolution due to hemodilaysis

## 2021-06-13 NOTE — Assessment & Plan Note (Signed)
New diagnosis. 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  He has preserved basic ADL abilities.  His stage of dementia is mild.  He does have behavioral and psychological symptoms related to his dementia: paranoia thoughts and possible visual hallucination about snakes.  If he feels wronged or disrespected, he can perseverate on the thought/feeling.  He has suspicion of care and recommendations of his hemodialysis providers.  This thought impairment does not appear to have risen to a level of concern for the family.   Efficancy and risks of AcChEi reviewed with patient and family.  William [name] declined this treatment.  Role of exercise, healthy diet, and social activity in slowing decline in memory were discussed with patient and his daughter.  NIH booklet on caring for patient's with dementia given to daughter. Information about local senior resources given to daughter.  She may look into senior day centers.  William [name] is interested in attending senior day centers.

## 2021-06-13 NOTE — Progress Notes (Signed)
Remote pacemaker transmission.   
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
## 2021-06-16 DIAGNOSIS — N186 End stage renal disease: Secondary | ICD-10-CM | POA: Diagnosis not present

## 2021-06-16 DIAGNOSIS — Z992 Dependence on renal dialysis: Secondary | ICD-10-CM | POA: Diagnosis not present

## 2021-06-16 DIAGNOSIS — N2581 Secondary hyperparathyroidism of renal origin: Secondary | ICD-10-CM | POA: Diagnosis not present

## 2021-06-17 DIAGNOSIS — T827XXD Infection and inflammatory reaction due to other cardiac and vascular devices, implants and grafts, subsequent encounter: Secondary | ICD-10-CM | POA: Diagnosis not present

## 2021-06-17 DIAGNOSIS — E1122 Type 2 diabetes mellitus with diabetic chronic kidney disease: Secondary | ICD-10-CM | POA: Diagnosis not present

## 2021-06-17 DIAGNOSIS — I5022 Chronic systolic (congestive) heart failure: Secondary | ICD-10-CM | POA: Diagnosis not present

## 2021-06-17 DIAGNOSIS — N186 End stage renal disease: Secondary | ICD-10-CM | POA: Diagnosis not present

## 2021-06-17 DIAGNOSIS — I132 Hypertensive heart and chronic kidney disease with heart failure and with stage 5 chronic kidney disease, or end stage renal disease: Secondary | ICD-10-CM | POA: Diagnosis not present

## 2021-06-17 DIAGNOSIS — D631 Anemia in chronic kidney disease: Secondary | ICD-10-CM | POA: Diagnosis not present

## 2021-06-17 DIAGNOSIS — B957 Other staphylococcus as the cause of diseases classified elsewhere: Secondary | ICD-10-CM | POA: Diagnosis not present

## 2021-06-17 DIAGNOSIS — B9562 Methicillin resistant Staphylococcus aureus infection as the cause of diseases classified elsewhere: Secondary | ICD-10-CM | POA: Diagnosis not present

## 2021-06-17 DIAGNOSIS — E1151 Type 2 diabetes mellitus with diabetic peripheral angiopathy without gangrene: Secondary | ICD-10-CM | POA: Diagnosis not present

## 2021-06-18 DIAGNOSIS — Z992 Dependence on renal dialysis: Secondary | ICD-10-CM | POA: Diagnosis not present

## 2021-06-18 DIAGNOSIS — N186 End stage renal disease: Secondary | ICD-10-CM | POA: Diagnosis not present

## 2021-06-18 DIAGNOSIS — N2581 Secondary hyperparathyroidism of renal origin: Secondary | ICD-10-CM | POA: Diagnosis not present

## 2021-06-20 DIAGNOSIS — N186 End stage renal disease: Secondary | ICD-10-CM | POA: Diagnosis not present

## 2021-06-20 DIAGNOSIS — N2581 Secondary hyperparathyroidism of renal origin: Secondary | ICD-10-CM | POA: Diagnosis not present

## 2021-06-20 DIAGNOSIS — Z992 Dependence on renal dialysis: Secondary | ICD-10-CM | POA: Diagnosis not present

## 2021-06-23 DIAGNOSIS — Z992 Dependence on renal dialysis: Secondary | ICD-10-CM | POA: Diagnosis not present

## 2021-06-23 DIAGNOSIS — N2581 Secondary hyperparathyroidism of renal origin: Secondary | ICD-10-CM | POA: Diagnosis not present

## 2021-06-23 DIAGNOSIS — N186 End stage renal disease: Secondary | ICD-10-CM | POA: Diagnosis not present

## 2021-06-24 DIAGNOSIS — E1140 Type 2 diabetes mellitus with diabetic neuropathy, unspecified: Secondary | ICD-10-CM | POA: Diagnosis not present

## 2021-06-24 DIAGNOSIS — D631 Anemia in chronic kidney disease: Secondary | ICD-10-CM | POA: Diagnosis not present

## 2021-06-24 DIAGNOSIS — I132 Hypertensive heart and chronic kidney disease with heart failure and with stage 5 chronic kidney disease, or end stage renal disease: Secondary | ICD-10-CM | POA: Diagnosis not present

## 2021-06-24 DIAGNOSIS — N186 End stage renal disease: Secondary | ICD-10-CM | POA: Diagnosis not present

## 2021-06-24 DIAGNOSIS — I441 Atrioventricular block, second degree: Secondary | ICD-10-CM | POA: Diagnosis not present

## 2021-06-24 DIAGNOSIS — E1122 Type 2 diabetes mellitus with diabetic chronic kidney disease: Secondary | ICD-10-CM | POA: Diagnosis not present

## 2021-06-24 DIAGNOSIS — E1151 Type 2 diabetes mellitus with diabetic peripheral angiopathy without gangrene: Secondary | ICD-10-CM | POA: Diagnosis not present

## 2021-06-24 DIAGNOSIS — I5022 Chronic systolic (congestive) heart failure: Secondary | ICD-10-CM | POA: Diagnosis not present

## 2021-06-24 DIAGNOSIS — I2510 Atherosclerotic heart disease of native coronary artery without angina pectoris: Secondary | ICD-10-CM | POA: Diagnosis not present

## 2021-06-26 DIAGNOSIS — N186 End stage renal disease: Secondary | ICD-10-CM | POA: Diagnosis not present

## 2021-06-26 DIAGNOSIS — Z992 Dependence on renal dialysis: Secondary | ICD-10-CM | POA: Diagnosis not present

## 2021-06-26 DIAGNOSIS — N2581 Secondary hyperparathyroidism of renal origin: Secondary | ICD-10-CM | POA: Diagnosis not present

## 2021-06-26 DIAGNOSIS — Z03818 Encounter for observation for suspected exposure to other biological agents ruled out: Secondary | ICD-10-CM | POA: Diagnosis not present

## 2021-06-27 ENCOUNTER — Telehealth: Payer: Self-pay | Admitting: Nurse Practitioner

## 2021-06-27 DIAGNOSIS — N2581 Secondary hyperparathyroidism of renal origin: Secondary | ICD-10-CM | POA: Diagnosis not present

## 2021-06-27 DIAGNOSIS — N186 End stage renal disease: Secondary | ICD-10-CM | POA: Diagnosis not present

## 2021-06-27 DIAGNOSIS — Z992 Dependence on renal dialysis: Secondary | ICD-10-CM | POA: Diagnosis not present

## 2021-06-27 NOTE — Telephone Encounter (Signed)
Spoke with patient's daughter, [name], about rescheduling a Palliative f/u visit with NP and she requested to call me back due to being in a conference at the time of my call.

## 2021-06-27 NOTE — Telephone Encounter (Signed)
Ret'd call to daughter to offer to reschedule a Palliative f/u visit for patient, no answer - left message requesting a return call.

## 2021-06-28 DIAGNOSIS — I441 Atrioventricular block, second degree: Secondary | ICD-10-CM | POA: Diagnosis not present

## 2021-06-28 DIAGNOSIS — E1122 Type 2 diabetes mellitus with diabetic chronic kidney disease: Secondary | ICD-10-CM | POA: Diagnosis not present

## 2021-06-28 DIAGNOSIS — I2510 Atherosclerotic heart disease of native coronary artery without angina pectoris: Secondary | ICD-10-CM | POA: Diagnosis not present

## 2021-06-28 DIAGNOSIS — D631 Anemia in chronic kidney disease: Secondary | ICD-10-CM | POA: Diagnosis not present

## 2021-06-28 DIAGNOSIS — N186 End stage renal disease: Secondary | ICD-10-CM | POA: Diagnosis not present

## 2021-06-28 DIAGNOSIS — E1140 Type 2 diabetes mellitus with diabetic neuropathy, unspecified: Secondary | ICD-10-CM | POA: Diagnosis not present

## 2021-06-28 DIAGNOSIS — E1151 Type 2 diabetes mellitus with diabetic peripheral angiopathy without gangrene: Secondary | ICD-10-CM | POA: Diagnosis not present

## 2021-06-28 DIAGNOSIS — I5022 Chronic systolic (congestive) heart failure: Secondary | ICD-10-CM | POA: Diagnosis not present

## 2021-06-28 DIAGNOSIS — I132 Hypertensive heart and chronic kidney disease with heart failure and with stage 5 chronic kidney disease, or end stage renal disease: Secondary | ICD-10-CM | POA: Diagnosis not present

## 2021-06-30 DIAGNOSIS — N186 End stage renal disease: Secondary | ICD-10-CM | POA: Diagnosis not present

## 2021-06-30 DIAGNOSIS — N2581 Secondary hyperparathyroidism of renal origin: Secondary | ICD-10-CM | POA: Diagnosis not present

## 2021-06-30 DIAGNOSIS — Z992 Dependence on renal dialysis: Secondary | ICD-10-CM | POA: Diagnosis not present

## 2021-07-01 ENCOUNTER — Ambulatory Visit: Payer: Medicare PPO | Admitting: Licensed Clinical Social Worker

## 2021-07-01 ENCOUNTER — Telehealth: Payer: Self-pay | Admitting: Nurse Practitioner

## 2021-07-01 DIAGNOSIS — J111 Influenza due to unidentified influenza virus with other respiratory manifestations: Secondary | ICD-10-CM | POA: Diagnosis not present

## 2021-07-01 DIAGNOSIS — Z636 Dependent relative needing care at home: Secondary | ICD-10-CM

## 2021-07-01 NOTE — Chronic Care Management (AMB) (Signed)
  Care Management   Licensed Clinical Social Worker Follow Up Note  07/01/2021 Name: CHUCK CABAN MRN: [id number] DOB: 1940/11/05  Previously referred by Dr. McDiarmid for Caregiver Stress support.  [name] met all agreed upon patient stated and clinical goals. Today, I Collaboration with his daughter [name]   for the purpose of ongoing follow up and assessment of chronic disease management and care coordination needs.   Assessment:  Family continues to experience difficulty with managing patient's behavior . Discussed community options, Family will explore PACE and other community resources discussed. No care plan goals established during this encounter.  Recommendation: Patient may benefit from, and daughter is in agreement to call PACE program.   Follow up Plan: Patient's caregiver would like continued follow-up from CCM LCSW .  per caregiver's request CCM LCSW will follow up in 30 days.  They will call the office if needed prior to next encounter.  Review of patient status, including review of consultants reports, relevant laboratory and other test results, and collaboration with appropriate care team members and the patient's provider was performed as part of comprehensive patient evaluation and provision of chronic care management services.    Outpatient Encounter Medications as of 07/01/2021  Medication Sig Note   Ascorbic Acid (VITAMIN C) 1000 MG tablet Take 1 tablet by mouth daily.    aspirin 81 MG chewable tablet Chew 1 tablet (81 mg total) by mouth daily.    atorvastatin (LIPITOR) 40 MG tablet Take 1 tablet (40 mg total) by mouth daily.    B Complex-C-Folic Acid (RENAL-VITE) 0.8 MG TABS Take 1 tablet by mouth daily.    calcitRIOL (ROCALTROL) 0.25 MCG capsule Take 5 capsules (1.25 mcg total) by mouth every Monday, Wednesday, and Friday with hemodialysis.    famotidine (PEPCID) 10 MG tablet Take 1 tablet (10 mg total) by mouth daily. 06/12/2021: Takes PRN   Methoxy  PEG-Epoetin Beta (MIRCERA IJ) Mircera 06/12/2021: Once weekly   metoprolol tartrate (LOPRESSOR) 25 MG tablet Take 0.5 tablets (12.5 mg total) by mouth 2 (two) times daily.    ondansetron (ZOFRAN) 4 MG tablet Take 1 tablet (4 mg total) by mouth every 8 (eight) hours as needed for nausea or vomiting. 06/12/2021: Has not recently needed   No facility-administered encounter medications on file as of 07/01/2021.   Patient Care Plan: General Social Work (Adult)     Problem Identified: Caregiver Stress      Goal: Caregiver Coping Optimized   Start Date: 07/01/2021  This Visit's Progress: On track  Priority: High  Note:   Current barriers:    Memory Deficits Clinical Goals: Patient's daughter will work with resources discussed to address needs related to patient Clinical Interventions:  Assessment of needs, barriers , agencies contacted, as well as how impacting  Solution-Focused Strategies Active listening / Reflection utilized  Problem Velva acknowledged  Review various resources, discussed options and provided patient information about  PACE Program Services provided by [employer]  Dementia resources and support  Patient Goals/Self-Care Activities: Over the next 30 days Call PACE program [place], [place] / [place]   [phone] 9:32 AM

## 2021-07-01 NOTE — Patient Instructions (Addendum)
Visit Information Visit Information   Goals Addressed             This Visit's Progress    Caregiver Coping Optimized       Patient Goals/Self-Care Activities: Over the next 30 days Call PACE program [phone]         It was a pleasure speaking with you today. per your request your appointment is scheduled 08/05/2021 Patient's daughter verbalizes understanding of instructions provided today.   Casimer Lanius, LCSW Care Management & Coordination  [phone]

## 2021-07-02 DIAGNOSIS — N186 End stage renal disease: Secondary | ICD-10-CM | POA: Diagnosis not present

## 2021-07-02 DIAGNOSIS — N2581 Secondary hyperparathyroidism of renal origin: Secondary | ICD-10-CM | POA: Diagnosis not present

## 2021-07-02 DIAGNOSIS — Z992 Dependence on renal dialysis: Secondary | ICD-10-CM | POA: Diagnosis not present

## 2021-07-02 NOTE — Telephone Encounter (Signed)
Spoke with patient's daughter, [name], and she has agreed to schedule a Palliative f/u visit with patient, this was scheduled for 08/26/21 @ 11 AM.

## 2021-07-03 DIAGNOSIS — E1122 Type 2 diabetes mellitus with diabetic chronic kidney disease: Secondary | ICD-10-CM | POA: Diagnosis not present

## 2021-07-03 DIAGNOSIS — D631 Anemia in chronic kidney disease: Secondary | ICD-10-CM | POA: Diagnosis not present

## 2021-07-03 DIAGNOSIS — I2510 Atherosclerotic heart disease of native coronary artery without angina pectoris: Secondary | ICD-10-CM | POA: Diagnosis not present

## 2021-07-03 DIAGNOSIS — I441 Atrioventricular block, second degree: Secondary | ICD-10-CM | POA: Diagnosis not present

## 2021-07-03 DIAGNOSIS — N186 End stage renal disease: Secondary | ICD-10-CM | POA: Diagnosis not present

## 2021-07-03 DIAGNOSIS — I5022 Chronic systolic (congestive) heart failure: Secondary | ICD-10-CM | POA: Diagnosis not present

## 2021-07-03 DIAGNOSIS — E1140 Type 2 diabetes mellitus with diabetic neuropathy, unspecified: Secondary | ICD-10-CM | POA: Diagnosis not present

## 2021-07-03 DIAGNOSIS — I132 Hypertensive heart and chronic kidney disease with heart failure and with stage 5 chronic kidney disease, or end stage renal disease: Secondary | ICD-10-CM | POA: Diagnosis not present

## 2021-07-03 DIAGNOSIS — E1151 Type 2 diabetes mellitus with diabetic peripheral angiopathy without gangrene: Secondary | ICD-10-CM | POA: Diagnosis not present

## 2021-07-04 DIAGNOSIS — N186 End stage renal disease: Secondary | ICD-10-CM | POA: Diagnosis not present

## 2021-07-04 DIAGNOSIS — Z992 Dependence on renal dialysis: Secondary | ICD-10-CM | POA: Diagnosis not present

## 2021-07-04 DIAGNOSIS — N2581 Secondary hyperparathyroidism of renal origin: Secondary | ICD-10-CM | POA: Diagnosis not present

## 2021-07-07 DIAGNOSIS — Z992 Dependence on renal dialysis: Secondary | ICD-10-CM | POA: Diagnosis not present

## 2021-07-07 DIAGNOSIS — N186 End stage renal disease: Secondary | ICD-10-CM | POA: Diagnosis not present

## 2021-07-07 DIAGNOSIS — N2581 Secondary hyperparathyroidism of renal origin: Secondary | ICD-10-CM | POA: Diagnosis not present

## 2021-07-08 DIAGNOSIS — I2510 Atherosclerotic heart disease of native coronary artery without angina pectoris: Secondary | ICD-10-CM | POA: Diagnosis not present

## 2021-07-08 DIAGNOSIS — I5022 Chronic systolic (congestive) heart failure: Secondary | ICD-10-CM | POA: Diagnosis not present

## 2021-07-08 DIAGNOSIS — E1122 Type 2 diabetes mellitus with diabetic chronic kidney disease: Secondary | ICD-10-CM | POA: Diagnosis not present

## 2021-07-08 DIAGNOSIS — E1140 Type 2 diabetes mellitus with diabetic neuropathy, unspecified: Secondary | ICD-10-CM | POA: Diagnosis not present

## 2021-07-08 DIAGNOSIS — D631 Anemia in chronic kidney disease: Secondary | ICD-10-CM | POA: Diagnosis not present

## 2021-07-08 DIAGNOSIS — I132 Hypertensive heart and chronic kidney disease with heart failure and with stage 5 chronic kidney disease, or end stage renal disease: Secondary | ICD-10-CM | POA: Diagnosis not present

## 2021-07-08 DIAGNOSIS — E1151 Type 2 diabetes mellitus with diabetic peripheral angiopathy without gangrene: Secondary | ICD-10-CM | POA: Diagnosis not present

## 2021-07-08 DIAGNOSIS — N186 End stage renal disease: Secondary | ICD-10-CM | POA: Diagnosis not present

## 2021-07-08 DIAGNOSIS — I441 Atrioventricular block, second degree: Secondary | ICD-10-CM | POA: Diagnosis not present

## 2021-07-09 DIAGNOSIS — N2581 Secondary hyperparathyroidism of renal origin: Secondary | ICD-10-CM | POA: Diagnosis not present

## 2021-07-09 DIAGNOSIS — Z992 Dependence on renal dialysis: Secondary | ICD-10-CM | POA: Diagnosis not present

## 2021-07-09 DIAGNOSIS — N186 End stage renal disease: Secondary | ICD-10-CM | POA: Diagnosis not present

## 2021-07-10 ENCOUNTER — Other Ambulatory Visit: Payer: Self-pay | Admitting: Family Medicine

## 2021-07-10 DIAGNOSIS — E1140 Type 2 diabetes mellitus with diabetic neuropathy, unspecified: Secondary | ICD-10-CM | POA: Diagnosis not present

## 2021-07-10 DIAGNOSIS — D631 Anemia in chronic kidney disease: Secondary | ICD-10-CM | POA: Diagnosis not present

## 2021-07-10 DIAGNOSIS — I5022 Chronic systolic (congestive) heart failure: Secondary | ICD-10-CM | POA: Diagnosis not present

## 2021-07-10 DIAGNOSIS — N186 End stage renal disease: Secondary | ICD-10-CM | POA: Diagnosis not present

## 2021-07-10 DIAGNOSIS — I441 Atrioventricular block, second degree: Secondary | ICD-10-CM | POA: Diagnosis not present

## 2021-07-10 DIAGNOSIS — E1151 Type 2 diabetes mellitus with diabetic peripheral angiopathy without gangrene: Secondary | ICD-10-CM | POA: Diagnosis not present

## 2021-07-10 DIAGNOSIS — I2510 Atherosclerotic heart disease of native coronary artery without angina pectoris: Secondary | ICD-10-CM | POA: Diagnosis not present

## 2021-07-10 DIAGNOSIS — I132 Hypertensive heart and chronic kidney disease with heart failure and with stage 5 chronic kidney disease, or end stage renal disease: Secondary | ICD-10-CM | POA: Diagnosis not present

## 2021-07-10 DIAGNOSIS — E1122 Type 2 diabetes mellitus with diabetic chronic kidney disease: Secondary | ICD-10-CM | POA: Diagnosis not present

## 2021-07-11 DIAGNOSIS — Z992 Dependence on renal dialysis: Secondary | ICD-10-CM | POA: Diagnosis not present

## 2021-07-11 DIAGNOSIS — I129 Hypertensive chronic kidney disease with stage 1 through stage 4 chronic kidney disease, or unspecified chronic kidney disease: Secondary | ICD-10-CM | POA: Diagnosis not present

## 2021-07-11 DIAGNOSIS — N2581 Secondary hyperparathyroidism of renal origin: Secondary | ICD-10-CM | POA: Diagnosis not present

## 2021-07-11 DIAGNOSIS — N186 End stage renal disease: Secondary | ICD-10-CM | POA: Diagnosis not present

## 2021-07-14 DIAGNOSIS — N2581 Secondary hyperparathyroidism of renal origin: Secondary | ICD-10-CM | POA: Diagnosis not present

## 2021-07-14 DIAGNOSIS — Z992 Dependence on renal dialysis: Secondary | ICD-10-CM | POA: Diagnosis not present

## 2021-07-14 DIAGNOSIS — N186 End stage renal disease: Secondary | ICD-10-CM | POA: Diagnosis not present

## 2021-07-15 DIAGNOSIS — I5022 Chronic systolic (congestive) heart failure: Secondary | ICD-10-CM | POA: Diagnosis not present

## 2021-07-15 DIAGNOSIS — E1140 Type 2 diabetes mellitus with diabetic neuropathy, unspecified: Secondary | ICD-10-CM | POA: Diagnosis not present

## 2021-07-15 DIAGNOSIS — D631 Anemia in chronic kidney disease: Secondary | ICD-10-CM | POA: Diagnosis not present

## 2021-07-15 DIAGNOSIS — I441 Atrioventricular block, second degree: Secondary | ICD-10-CM | POA: Diagnosis not present

## 2021-07-15 DIAGNOSIS — N186 End stage renal disease: Secondary | ICD-10-CM | POA: Diagnosis not present

## 2021-07-15 DIAGNOSIS — I2510 Atherosclerotic heart disease of native coronary artery without angina pectoris: Secondary | ICD-10-CM | POA: Diagnosis not present

## 2021-07-15 DIAGNOSIS — I132 Hypertensive heart and chronic kidney disease with heart failure and with stage 5 chronic kidney disease, or end stage renal disease: Secondary | ICD-10-CM | POA: Diagnosis not present

## 2021-07-15 DIAGNOSIS — E1122 Type 2 diabetes mellitus with diabetic chronic kidney disease: Secondary | ICD-10-CM | POA: Diagnosis not present

## 2021-07-15 DIAGNOSIS — E1151 Type 2 diabetes mellitus with diabetic peripheral angiopathy without gangrene: Secondary | ICD-10-CM | POA: Diagnosis not present

## 2021-07-17 DIAGNOSIS — N2581 Secondary hyperparathyroidism of renal origin: Secondary | ICD-10-CM | POA: Diagnosis not present

## 2021-07-17 DIAGNOSIS — N186 End stage renal disease: Secondary | ICD-10-CM | POA: Diagnosis not present

## 2021-07-17 DIAGNOSIS — T8249XA Other complication of vascular dialysis catheter, initial encounter: Secondary | ICD-10-CM | POA: Diagnosis not present

## 2021-07-17 DIAGNOSIS — I10 Essential (primary) hypertension: Secondary | ICD-10-CM | POA: Diagnosis not present

## 2021-07-17 DIAGNOSIS — Z743 Need for continuous supervision: Secondary | ICD-10-CM | POA: Diagnosis not present

## 2021-07-17 DIAGNOSIS — I871 Compression of vein: Secondary | ICD-10-CM | POA: Diagnosis not present

## 2021-07-17 DIAGNOSIS — Z992 Dependence on renal dialysis: Secondary | ICD-10-CM | POA: Diagnosis not present

## 2021-07-17 NOTE — ED Triage Notes (Signed)
Pt here via [facility].  Had graft placed to R upper thigh this am.  Bleeding is now controlled.  Vs 184/118 Hr 80 Rr 16 Spo2 100

## 2021-07-18 ENCOUNTER — Encounter (HOSPITAL_COMMUNITY): Payer: Self-pay | Admitting: *Deleted

## 2021-07-18 ENCOUNTER — Other Ambulatory Visit: Payer: Self-pay

## 2021-07-18 ENCOUNTER — Other Ambulatory Visit (HOSPITAL_COMMUNITY): Payer: Self-pay | Admitting: Interventional Radiology

## 2021-07-18 ENCOUNTER — Emergency Department (HOSPITAL_COMMUNITY)
Admission: EM | Admit: 2021-07-18 | Discharge: 2021-07-18 | Disposition: A | Discharge status: Home / Self Care | Payer: Medicare PPO | Attending: Emergency Medicine | Admitting: Emergency Medicine

## 2021-07-18 DIAGNOSIS — T82838A Hemorrhage of vascular prosthetic devices, implants and grafts, initial encounter: Secondary | ICD-10-CM | POA: Insufficient documentation

## 2021-07-18 DIAGNOSIS — E1122 Type 2 diabetes mellitus with diabetic chronic kidney disease: Secondary | ICD-10-CM | POA: Diagnosis not present

## 2021-07-18 DIAGNOSIS — N186 End stage renal disease: Secondary | ICD-10-CM | POA: Insufficient documentation

## 2021-07-18 DIAGNOSIS — I132 Hypertensive heart and chronic kidney disease with heart failure and with stage 5 chronic kidney disease, or end stage renal disease: Secondary | ICD-10-CM | POA: Diagnosis not present

## 2021-07-18 DIAGNOSIS — Z992 Dependence on renal dialysis: Secondary | ICD-10-CM

## 2021-07-18 DIAGNOSIS — R600 Localized edema: Secondary | ICD-10-CM | POA: Insufficient documentation

## 2021-07-18 DIAGNOSIS — I5043 Acute on chronic combined systolic (congestive) and diastolic (congestive) heart failure: Secondary | ICD-10-CM | POA: Diagnosis not present

## 2021-07-18 DIAGNOSIS — Z7982 Long term (current) use of aspirin: Secondary | ICD-10-CM | POA: Insufficient documentation

## 2021-07-18 DIAGNOSIS — F03918 Unspecified dementia, unspecified severity, with other behavioral disturbance: Secondary | ICD-10-CM | POA: Insufficient documentation

## 2021-07-18 DIAGNOSIS — L84 Corns and callosities: Secondary | ICD-10-CM | POA: Insufficient documentation

## 2021-07-18 DIAGNOSIS — Z950 Presence of cardiac pacemaker: Secondary | ICD-10-CM | POA: Diagnosis not present

## 2021-07-18 DIAGNOSIS — Z8616 Personal history of COVID-19: Secondary | ICD-10-CM | POA: Diagnosis not present

## 2021-07-18 DIAGNOSIS — N2581 Secondary hyperparathyroidism of renal origin: Secondary | ICD-10-CM | POA: Diagnosis not present

## 2021-07-18 DIAGNOSIS — Z79899 Other long term (current) drug therapy: Secondary | ICD-10-CM | POA: Insufficient documentation

## 2021-07-18 LAB — BASIC METABOLIC PANEL
Anion gap: 17 — ABNORMAL HIGH (ref 5–15)
BUN: 66 mg/dL — ABNORMAL HIGH (ref 8–23)
CO2: 21 mmol/L — ABNORMAL LOW (ref 22–32)
Calcium: 9.2 mg/dL (ref 8.9–10.3)
Chloride: 95 mmol/L — ABNORMAL LOW (ref 98–111)
Creatinine, Ser: 12.33 mg/dL — ABNORMAL HIGH (ref 0.61–1.24)
GFR, Estimated: 4 mL/min — ABNORMAL LOW (ref >60)
Glucose, Bld: 74 mg/dL (ref 70–99)
Potassium: 4.4 mmol/L (ref 3.5–5.1)
Sodium: 133 mmol/L — ABNORMAL LOW (ref 135–145)

## 2021-07-18 LAB — CBC
HCT: 37.2 % — ABNORMAL LOW (ref 39.0–52.0)
Hemoglobin: 11.8 g/dL — ABNORMAL LOW (ref 13.0–17.0)
MCH: 30.2 pg (ref 26.0–34.0)
MCHC: 31.7 g/dL (ref 30.0–36.0)
MCV: 95.1 fL (ref 80.0–100.0)
Platelets: 193 10*3/uL (ref 150–400)
RBC: 3.91 MIL/uL — ABNORMAL LOW (ref 4.22–5.81)
RDW: 15.9 % — ABNORMAL HIGH (ref 11.5–15.5)
WBC: 8.1 10*3/uL (ref 4.0–10.5)
nRBC: 0 % (ref 0.0–0.2)

## 2021-07-18 MED ORDER — LIDOCAINE HCL (PF) 1 % IJ SOLN
5.0000 mL | Freq: Once | INTRAMUSCULAR | Status: DC
  Filled 2021-07-18: qty 5
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
## 2021-07-18 NOTE — ED Provider Notes (Signed)
Emergency Medicine Provider Triage Evaluation Note  William [name] , a 80 y.o. male  was evaluated in triage.  Pt complains of bleeding from surgical site. Had RLE vascular catheter placed @ 8AM this morning, started to have bleeding at 4PM, has continued. No alleviating/aggravating factors.   Review of Systems  Positive: bleeding Negative: Syncope, dizziness  Physical Exam  BP (!) 166/89 (BP Location: Left Arm)   Pulse 60   Temp 97.9 F (36.6 C) (Oral)   Resp 16  Gen:   Awake, no distress   Resp:  Normal effort  MSK:   Moves extremities without difficulty  Other:  R thigh/groin: catheter in place, blood soaked bandage partially removed, no pulsatile bleeding, erythema, or purulence. Dressing replaced by nursing staff.   Medical Decision Making  Medically screening exam initiated at 12:06 AM.  Appropriate orders placed.  William [name] was informed that the remainder of the evaluation will be completed by another provider, this initial triage assessment does not replace that evaluation, and the importance of remaining in the ED until their evaluation is complete.  Post op problem.    Leafy Kindle 07/18/21 0008    Ripley Fraise, MD 07/18/21 [phone]

## 2021-07-18 NOTE — ED Notes (Signed)
Quick clot and new bandage applied to R femoral dialysis graft at 0013

## 2021-07-18 NOTE — ED Notes (Signed)
Pt and daughter (per pt's request) provided with discharge instructions; both verbalized understanding; opportunity for questions provided

## 2021-07-18 NOTE — ED Notes (Signed)
Pt off unit via [place]; Reports daughter is ride home. No s/s of acute distress noted at discharge.

## 2021-07-18 NOTE — Discharge Instructions (Signed)
You came to the emergency department today to be evaluated for your catheter site bleeding.  Your lab work was reassuring.  After direct pressure your bleeding was controlled.  If you start to slow bleeding from the catheter site in the future please apply gentle but firm pressure to the site for 10 minutes and reevaluate.  Please contact and follow-up with your vascular surgeon.  Please return to the emergency department if you develop bleeding that is unable to be controlled with direct pressure, develop color change/numbness/weakness/severe pain to your right lower leg or lose consciousness

## 2021-07-21 ENCOUNTER — Other Ambulatory Visit: Payer: Self-pay | Admitting: Radiology

## 2021-07-21 ENCOUNTER — Other Ambulatory Visit: Payer: Self-pay | Admitting: Student

## 2021-07-21 DIAGNOSIS — N2581 Secondary hyperparathyroidism of renal origin: Secondary | ICD-10-CM | POA: Diagnosis not present

## 2021-07-21 DIAGNOSIS — N186 End stage renal disease: Secondary | ICD-10-CM | POA: Diagnosis not present

## 2021-07-21 DIAGNOSIS — Z992 Dependence on renal dialysis: Secondary | ICD-10-CM | POA: Diagnosis not present

## 2021-07-22 ENCOUNTER — Other Ambulatory Visit: Payer: Self-pay

## 2021-07-22 ENCOUNTER — Encounter (HOSPITAL_COMMUNITY): Payer: Self-pay

## 2021-07-22 ENCOUNTER — Other Ambulatory Visit (HOSPITAL_COMMUNITY): Payer: Self-pay | Admitting: Interventional Radiology

## 2021-07-22 ENCOUNTER — Ambulatory Visit (HOSPITAL_COMMUNITY)
Admission: RE | Admit: 2021-07-22 | Discharge: 2021-07-22 | Disposition: A | Discharge status: Home / Self Care | Payer: Medicare PPO | Source: Ambulatory Visit | Attending: Interventional Radiology | Admitting: Interventional Radiology

## 2021-07-22 DIAGNOSIS — Z8616 Personal history of COVID-19: Secondary | ICD-10-CM | POA: Diagnosis not present

## 2021-07-22 DIAGNOSIS — Z888 Allergy status to other drugs, medicaments and biological substances status: Secondary | ICD-10-CM | POA: Diagnosis not present

## 2021-07-22 DIAGNOSIS — I132 Hypertensive heart and chronic kidney disease with heart failure and with stage 5 chronic kidney disease, or end stage renal disease: Secondary | ICD-10-CM | POA: Insufficient documentation

## 2021-07-22 DIAGNOSIS — Z4901 Encounter for fitting and adjustment of extracorporeal dialysis catheter: Secondary | ICD-10-CM | POA: Insufficient documentation

## 2021-07-22 DIAGNOSIS — E1122 Type 2 diabetes mellitus with diabetic chronic kidney disease: Secondary | ICD-10-CM | POA: Insufficient documentation

## 2021-07-22 DIAGNOSIS — T8249XA Other complication of vascular dialysis catheter, initial encounter: Secondary | ICD-10-CM | POA: Diagnosis not present

## 2021-07-22 DIAGNOSIS — Z7982 Long term (current) use of aspirin: Secondary | ICD-10-CM | POA: Diagnosis not present

## 2021-07-22 DIAGNOSIS — Z992 Dependence on renal dialysis: Secondary | ICD-10-CM | POA: Diagnosis not present

## 2021-07-22 DIAGNOSIS — Z79899 Other long term (current) drug therapy: Secondary | ICD-10-CM | POA: Diagnosis not present

## 2021-07-22 DIAGNOSIS — N186 End stage renal disease: Secondary | ICD-10-CM

## 2021-07-22 DIAGNOSIS — I5022 Chronic systolic (congestive) heart failure: Secondary | ICD-10-CM | POA: Insufficient documentation

## 2021-07-22 DIAGNOSIS — N2581 Secondary hyperparathyroidism of renal origin: Secondary | ICD-10-CM | POA: Diagnosis not present

## 2021-07-22 HISTORY — PX: IR PTA ADDL CENTRAL DIALYSIS SEG THRU DIALY CIRCUIT RIGHT: IMG6121

## 2021-07-22 HISTORY — PX: IR VENOCAVAGRAM IVC: IMG678

## 2021-07-22 HISTORY — PX: IR FLUORO GUIDE CV LINE RIGHT: IMG2283

## 2021-07-22 LAB — GLUCOSE, CAPILLARY: Glucose-Capillary: 87 mg/dL (ref 70–99)

## 2021-07-22 MED ORDER — CEFAZOLIN SODIUM-DEXTROSE 2-4 GM/100ML-% IV SOLN: 2.0000 g | INTRAVENOUS | Status: AC

## 2021-07-22 MED ORDER — SODIUM CHLORIDE 0.9 % IV SOLN: INTRAVENOUS | Status: DC

## 2021-07-22 MED ORDER — IOHEXOL 300 MG/ML  SOLN
100.0000 mL | Freq: Once | INTRAMUSCULAR | Status: AC | PRN
  Administered 2021-07-22: 35 mL via INTRAVENOUS

## 2021-07-22 MED ORDER — LIDOCAINE-EPINEPHRINE 1 %-1:100000 IJ SOLN
INTRAMUSCULAR | Status: AC
  Administered 2021-07-22: 10 mL via INTRADERMAL
  Filled 2021-07-22: qty 1

## 2021-07-22 MED ORDER — MIDAZOLAM HCL 2 MG/2ML IJ SOLN
INTRAMUSCULAR | Status: DC | PRN
Start: 2021-07-22 — End: 2021-07-23
  Administered 2021-07-22: .5 mg via INTRAVENOUS
  Administered 2021-07-22: 1 mg via INTRAVENOUS
  Administered 2021-07-22: .5 mg via INTRAVENOUS

## 2021-07-22 MED ORDER — FENTANYL CITRATE (PF) 100 MCG/2ML IJ SOLN
INTRAMUSCULAR | Status: AC
  Filled 2021-07-22: qty 4

## 2021-07-22 MED ORDER — CEFAZOLIN SODIUM-DEXTROSE 2-4 GM/100ML-% IV SOLN
INTRAVENOUS | Status: AC
  Administered 2021-07-22: 2 g via INTRAVENOUS
  Filled 2021-07-22: qty 100

## 2021-07-22 MED ORDER — MIDAZOLAM HCL 2 MG/2ML IJ SOLN
INTRAMUSCULAR | Status: AC
  Filled 2021-07-22: qty 4

## 2021-07-22 MED ORDER — FENTANYL CITRATE (PF) 100 MCG/2ML IJ SOLN
INTRAMUSCULAR | Status: DC | PRN
  Administered 2021-07-22: 50 ug via INTRAVENOUS
  Administered 2021-07-22 (×2): 25 ug via INTRAVENOUS

## 2021-07-22 MED ORDER — HEPARIN SODIUM (PORCINE) 1000 UNIT/ML IJ SOLN
INTRAMUSCULAR | Status: AC
  Administered 2021-07-22: 3.2 mL
  Filled 2021-07-22: qty 1

## 2021-07-22 NOTE — Sedation Documentation (Signed)
Attempted to call report to Short Stay. Will have to call back.

## 2021-07-22 NOTE — Procedures (Signed)
Interventional Radiology Procedure Note  Procedure: fem HD cath exchg with IVC venogram and 14 mm venous PTA of the IVC    Complications: None  Estimated Blood Loss:  min  Findings: Hepatic IVC occlusion  Successful 18m PTA to reposition the HD cath tip in the RA      M. TDaryll Brod MD
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
## 2021-07-22 NOTE — Sedation Documentation (Signed)
SBAR given to [place], Therapist, sports. All questions answered to satisfaction. Pt awaiting transport back to [facility] for recovery.

## 2021-07-23 DIAGNOSIS — Z992 Dependence on renal dialysis: Secondary | ICD-10-CM | POA: Diagnosis not present

## 2021-07-23 DIAGNOSIS — N2581 Secondary hyperparathyroidism of renal origin: Secondary | ICD-10-CM | POA: Diagnosis not present

## 2021-07-23 DIAGNOSIS — N186 End stage renal disease: Secondary | ICD-10-CM | POA: Diagnosis not present

## 2021-07-24 DIAGNOSIS — D631 Anemia in chronic kidney disease: Secondary | ICD-10-CM | POA: Diagnosis not present

## 2021-07-24 DIAGNOSIS — I441 Atrioventricular block, second degree: Secondary | ICD-10-CM | POA: Diagnosis not present

## 2021-07-24 DIAGNOSIS — E1122 Type 2 diabetes mellitus with diabetic chronic kidney disease: Secondary | ICD-10-CM | POA: Diagnosis not present

## 2021-07-24 DIAGNOSIS — I2510 Atherosclerotic heart disease of native coronary artery without angina pectoris: Secondary | ICD-10-CM | POA: Diagnosis not present

## 2021-07-24 DIAGNOSIS — N186 End stage renal disease: Secondary | ICD-10-CM | POA: Diagnosis not present

## 2021-07-24 DIAGNOSIS — E1151 Type 2 diabetes mellitus with diabetic peripheral angiopathy without gangrene: Secondary | ICD-10-CM | POA: Diagnosis not present

## 2021-07-24 DIAGNOSIS — I132 Hypertensive heart and chronic kidney disease with heart failure and with stage 5 chronic kidney disease, or end stage renal disease: Secondary | ICD-10-CM | POA: Diagnosis not present

## 2021-07-24 DIAGNOSIS — E1140 Type 2 diabetes mellitus with diabetic neuropathy, unspecified: Secondary | ICD-10-CM | POA: Diagnosis not present

## 2021-07-24 DIAGNOSIS — I5022 Chronic systolic (congestive) heart failure: Secondary | ICD-10-CM | POA: Diagnosis not present

## 2021-07-25 DIAGNOSIS — Z992 Dependence on renal dialysis: Secondary | ICD-10-CM | POA: Diagnosis not present

## 2021-07-25 DIAGNOSIS — N186 End stage renal disease: Secondary | ICD-10-CM | POA: Diagnosis not present

## 2021-07-25 DIAGNOSIS — N2581 Secondary hyperparathyroidism of renal origin: Secondary | ICD-10-CM | POA: Diagnosis not present

## 2021-07-28 DIAGNOSIS — N2581 Secondary hyperparathyroidism of renal origin: Secondary | ICD-10-CM | POA: Diagnosis not present

## 2021-07-28 DIAGNOSIS — Z992 Dependence on renal dialysis: Secondary | ICD-10-CM | POA: Diagnosis not present

## 2021-07-28 DIAGNOSIS — N186 End stage renal disease: Secondary | ICD-10-CM | POA: Diagnosis not present

## 2021-07-30 DIAGNOSIS — N2581 Secondary hyperparathyroidism of renal origin: Secondary | ICD-10-CM | POA: Diagnosis not present

## 2021-07-30 DIAGNOSIS — Z992 Dependence on renal dialysis: Secondary | ICD-10-CM | POA: Diagnosis not present

## 2021-07-30 DIAGNOSIS — N186 End stage renal disease: Secondary | ICD-10-CM | POA: Diagnosis not present

## 2021-07-31 DIAGNOSIS — D631 Anemia in chronic kidney disease: Secondary | ICD-10-CM | POA: Diagnosis not present

## 2021-07-31 DIAGNOSIS — E1151 Type 2 diabetes mellitus with diabetic peripheral angiopathy without gangrene: Secondary | ICD-10-CM | POA: Diagnosis not present

## 2021-07-31 DIAGNOSIS — I2510 Atherosclerotic heart disease of native coronary artery without angina pectoris: Secondary | ICD-10-CM | POA: Diagnosis not present

## 2021-07-31 DIAGNOSIS — I132 Hypertensive heart and chronic kidney disease with heart failure and with stage 5 chronic kidney disease, or end stage renal disease: Secondary | ICD-10-CM | POA: Diagnosis not present

## 2021-07-31 DIAGNOSIS — N186 End stage renal disease: Secondary | ICD-10-CM | POA: Diagnosis not present

## 2021-07-31 DIAGNOSIS — E1140 Type 2 diabetes mellitus with diabetic neuropathy, unspecified: Secondary | ICD-10-CM | POA: Diagnosis not present

## 2021-07-31 DIAGNOSIS — I441 Atrioventricular block, second degree: Secondary | ICD-10-CM | POA: Diagnosis not present

## 2021-07-31 DIAGNOSIS — E1122 Type 2 diabetes mellitus with diabetic chronic kidney disease: Secondary | ICD-10-CM | POA: Diagnosis not present

## 2021-07-31 DIAGNOSIS — I5022 Chronic systolic (congestive) heart failure: Secondary | ICD-10-CM | POA: Diagnosis not present

## 2021-08-01 DIAGNOSIS — N186 End stage renal disease: Secondary | ICD-10-CM | POA: Diagnosis not present

## 2021-08-01 DIAGNOSIS — Z992 Dependence on renal dialysis: Secondary | ICD-10-CM | POA: Diagnosis not present

## 2021-08-01 DIAGNOSIS — N2581 Secondary hyperparathyroidism of renal origin: Secondary | ICD-10-CM | POA: Diagnosis not present

## 2021-08-04 DIAGNOSIS — N2581 Secondary hyperparathyroidism of renal origin: Secondary | ICD-10-CM | POA: Diagnosis not present

## 2021-08-04 DIAGNOSIS — Z992 Dependence on renal dialysis: Secondary | ICD-10-CM | POA: Diagnosis not present

## 2021-08-04 DIAGNOSIS — N186 End stage renal disease: Secondary | ICD-10-CM | POA: Diagnosis not present

## 2021-08-05 ENCOUNTER — Telehealth: Payer: Medicare PPO

## 2021-08-05 ENCOUNTER — Telehealth: Payer: Self-pay | Admitting: Licensed Clinical Social Worker

## 2021-08-05 DIAGNOSIS — E1140 Type 2 diabetes mellitus with diabetic neuropathy, unspecified: Secondary | ICD-10-CM | POA: Diagnosis not present

## 2021-08-05 DIAGNOSIS — I5022 Chronic systolic (congestive) heart failure: Secondary | ICD-10-CM | POA: Diagnosis not present

## 2021-08-05 DIAGNOSIS — E1151 Type 2 diabetes mellitus with diabetic peripheral angiopathy without gangrene: Secondary | ICD-10-CM | POA: Diagnosis not present

## 2021-08-05 DIAGNOSIS — I441 Atrioventricular block, second degree: Secondary | ICD-10-CM | POA: Diagnosis not present

## 2021-08-05 DIAGNOSIS — I2510 Atherosclerotic heart disease of native coronary artery without angina pectoris: Secondary | ICD-10-CM | POA: Diagnosis not present

## 2021-08-05 DIAGNOSIS — N186 End stage renal disease: Secondary | ICD-10-CM | POA: Diagnosis not present

## 2021-08-05 DIAGNOSIS — D631 Anemia in chronic kidney disease: Secondary | ICD-10-CM | POA: Diagnosis not present

## 2021-08-05 DIAGNOSIS — I132 Hypertensive heart and chronic kidney disease with heart failure and with stage 5 chronic kidney disease, or end stage renal disease: Secondary | ICD-10-CM | POA: Diagnosis not present

## 2021-08-05 DIAGNOSIS — E1122 Type 2 diabetes mellitus with diabetic chronic kidney disease: Secondary | ICD-10-CM | POA: Diagnosis not present

## 2021-08-05 NOTE — Chronic Care Management (AMB) (Signed)
    Clinical Social Work  Care Management   Phone Outreach    08/05/2021 Name: AREND BAHL MRN: [id number] DOB: May 29, 1941  RITESH OPARA is a 80 y.o. year old male who is a primary care patient of Lilland, Alana, DO .   Reason for referral: Level of Care Concerns.    F/U phone call today to assess needs, progress and barriers with care plan goals.   Telephone outreach was unsuccessful. A HIPPA compliant phone message was left for the patient's daughter providing contact information and requesting a return call if she would like continued care giver support.    Plan:CCM LCSW will wait for return call. If no return call is received, Will disconnect from care team in 30 days.  Review of patient status, including review of consultants reports, relevant laboratory and other test results, and collaboration with appropriate care team members and the patient's provider was performed as part of comprehensive patient evaluation and provision of care management services.    Casimer Lanius, [place] / [place]   [phone]

## 2021-08-06 ENCOUNTER — Emergency Department (HOSPITAL_COMMUNITY)
Admission: EM | Admit: 2021-08-06 | Discharge: 2021-08-06 | Disposition: A | Discharge status: Home / Self Care | Payer: Medicare PPO | Source: Home / Self Care | Attending: Emergency Medicine | Admitting: Emergency Medicine

## 2021-08-06 ENCOUNTER — Encounter (HOSPITAL_COMMUNITY): Payer: Self-pay

## 2021-08-06 DIAGNOSIS — K4090 Unilateral inguinal hernia, without obstruction or gangrene, not specified as recurrent: Secondary | ICD-10-CM | POA: Diagnosis not present

## 2021-08-06 DIAGNOSIS — I2510 Atherosclerotic heart disease of native coronary artery without angina pectoris: Secondary | ICD-10-CM | POA: Insufficient documentation

## 2021-08-06 DIAGNOSIS — K449 Diaphragmatic hernia without obstruction or gangrene: Secondary | ICD-10-CM | POA: Diagnosis not present

## 2021-08-06 DIAGNOSIS — E1122 Type 2 diabetes mellitus with diabetic chronic kidney disease: Secondary | ICD-10-CM | POA: Insufficient documentation

## 2021-08-06 DIAGNOSIS — N186 End stage renal disease: Secondary | ICD-10-CM | POA: Diagnosis present

## 2021-08-06 DIAGNOSIS — N250 Renal osteodystrophy: Secondary | ICD-10-CM | POA: Diagnosis not present

## 2021-08-06 DIAGNOSIS — K225 Diverticulum of esophagus, acquired: Secondary | ICD-10-CM | POA: Diagnosis not present

## 2021-08-06 DIAGNOSIS — Z8616 Personal history of COVID-19: Secondary | ICD-10-CM | POA: Diagnosis not present

## 2021-08-06 DIAGNOSIS — R4182 Altered mental status, unspecified: Secondary | ICD-10-CM | POA: Diagnosis not present

## 2021-08-06 DIAGNOSIS — Z79899 Other long term (current) drug therapy: Secondary | ICD-10-CM | POA: Insufficient documentation

## 2021-08-06 DIAGNOSIS — I509 Heart failure, unspecified: Secondary | ICD-10-CM | POA: Diagnosis not present

## 2021-08-06 DIAGNOSIS — Q453 Other congenital malformations of pancreas and pancreatic duct: Secondary | ICD-10-CM | POA: Diagnosis not present

## 2021-08-06 DIAGNOSIS — R531 Weakness: Secondary | ICD-10-CM | POA: Diagnosis not present

## 2021-08-06 DIAGNOSIS — A419 Sepsis, unspecified organism: Secondary | ICD-10-CM | POA: Diagnosis present

## 2021-08-06 DIAGNOSIS — N289 Disorder of kidney and ureter, unspecified: Secondary | ICD-10-CM | POA: Diagnosis not present

## 2021-08-06 DIAGNOSIS — K5730 Diverticulosis of large intestine without perforation or abscess without bleeding: Secondary | ICD-10-CM | POA: Diagnosis not present

## 2021-08-06 DIAGNOSIS — E871 Hypo-osmolality and hyponatremia: Secondary | ICD-10-CM | POA: Diagnosis not present

## 2021-08-06 DIAGNOSIS — K760 Fatty (change of) liver, not elsewhere classified: Secondary | ICD-10-CM | POA: Diagnosis present

## 2021-08-06 DIAGNOSIS — J90 Pleural effusion, not elsewhere classified: Secondary | ICD-10-CM | POA: Diagnosis not present

## 2021-08-06 DIAGNOSIS — R0602 Shortness of breath: Secondary | ICD-10-CM | POA: Diagnosis not present

## 2021-08-06 DIAGNOSIS — I129 Hypertensive chronic kidney disease with stage 1 through stage 4 chronic kidney disease, or unspecified chronic kidney disease: Secondary | ICD-10-CM | POA: Diagnosis not present

## 2021-08-06 DIAGNOSIS — K922 Gastrointestinal hemorrhage, unspecified: Secondary | ICD-10-CM | POA: Diagnosis not present

## 2021-08-06 DIAGNOSIS — Y712 Prosthetic and other implants, materials and accessory cardiovascular devices associated with adverse incidents: Secondary | ICD-10-CM | POA: Diagnosis present

## 2021-08-06 DIAGNOSIS — T8241XA Breakdown (mechanical) of vascular dialysis catheter, initial encounter: Secondary | ICD-10-CM | POA: Diagnosis present

## 2021-08-06 DIAGNOSIS — Z66 Do not resuscitate: Secondary | ICD-10-CM | POA: Diagnosis not present

## 2021-08-06 DIAGNOSIS — D62 Acute posthemorrhagic anemia: Secondary | ICD-10-CM | POA: Diagnosis not present

## 2021-08-06 DIAGNOSIS — I472 Ventricular tachycardia, unspecified: Secondary | ICD-10-CM | POA: Diagnosis present

## 2021-08-06 DIAGNOSIS — R7401 Elevation of levels of liver transaminase levels: Secondary | ICD-10-CM | POA: Diagnosis not present

## 2021-08-06 DIAGNOSIS — R110 Nausea: Secondary | ICD-10-CM | POA: Diagnosis not present

## 2021-08-06 DIAGNOSIS — R739 Hyperglycemia, unspecified: Secondary | ICD-10-CM | POA: Diagnosis not present

## 2021-08-06 DIAGNOSIS — Z992 Dependence on renal dialysis: Secondary | ICD-10-CM | POA: Insufficient documentation

## 2021-08-06 DIAGNOSIS — K761 Chronic passive congestion of liver: Secondary | ICD-10-CM | POA: Diagnosis present

## 2021-08-06 DIAGNOSIS — Z20822 Contact with and (suspected) exposure to covid-19: Secondary | ICD-10-CM | POA: Diagnosis present

## 2021-08-06 DIAGNOSIS — K921 Melena: Secondary | ICD-10-CM | POA: Diagnosis not present

## 2021-08-06 DIAGNOSIS — K862 Cyst of pancreas: Secondary | ICD-10-CM | POA: Diagnosis not present

## 2021-08-06 DIAGNOSIS — J9811 Atelectasis: Secondary | ICD-10-CM | POA: Diagnosis not present

## 2021-08-06 DIAGNOSIS — Z7982 Long term (current) use of aspirin: Secondary | ICD-10-CM | POA: Insufficient documentation

## 2021-08-06 DIAGNOSIS — I10 Essential (primary) hypertension: Secondary | ICD-10-CM | POA: Diagnosis not present

## 2021-08-06 DIAGNOSIS — E872 Acidosis, unspecified: Secondary | ICD-10-CM | POA: Diagnosis present

## 2021-08-06 DIAGNOSIS — I5043 Acute on chronic combined systolic (congestive) and diastolic (congestive) heart failure: Secondary | ICD-10-CM | POA: Insufficient documentation

## 2021-08-06 DIAGNOSIS — E875 Hyperkalemia: Secondary | ICD-10-CM | POA: Diagnosis not present

## 2021-08-06 DIAGNOSIS — D631 Anemia in chronic kidney disease: Secondary | ICD-10-CM | POA: Diagnosis present

## 2021-08-06 DIAGNOSIS — K859 Acute pancreatitis without necrosis or infection, unspecified: Secondary | ICD-10-CM | POA: Diagnosis not present

## 2021-08-06 DIAGNOSIS — I132 Hypertensive heart and chronic kidney disease with heart failure and with stage 5 chronic kidney disease, or end stage renal disease: Secondary | ICD-10-CM | POA: Diagnosis present

## 2021-08-06 DIAGNOSIS — R109 Unspecified abdominal pain: Secondary | ICD-10-CM | POA: Diagnosis not present

## 2021-08-06 DIAGNOSIS — K7200 Acute and subacute hepatic failure without coma: Secondary | ICD-10-CM | POA: Diagnosis present

## 2021-08-06 DIAGNOSIS — K7682 Hepatic encephalopathy: Secondary | ICD-10-CM | POA: Diagnosis present

## 2021-08-06 DIAGNOSIS — R1111 Vomiting without nausea: Secondary | ICD-10-CM

## 2021-08-06 DIAGNOSIS — F03918 Unspecified dementia, unspecified severity, with other behavioral disturbance: Secondary | ICD-10-CM | POA: Diagnosis present

## 2021-08-06 DIAGNOSIS — R059 Cough, unspecified: Secondary | ICD-10-CM | POA: Diagnosis not present

## 2021-08-06 DIAGNOSIS — E8779 Other fluid overload: Secondary | ICD-10-CM | POA: Diagnosis not present

## 2021-08-06 DIAGNOSIS — T8249XA Other complication of vascular dialysis catheter, initial encounter: Secondary | ICD-10-CM | POA: Diagnosis not present

## 2021-08-06 DIAGNOSIS — I774 Celiac artery compression syndrome: Secondary | ICD-10-CM | POA: Diagnosis not present

## 2021-08-06 DIAGNOSIS — K920 Hematemesis: Secondary | ICD-10-CM | POA: Diagnosis not present

## 2021-08-06 DIAGNOSIS — N2581 Secondary hyperparathyroidism of renal origin: Secondary | ICD-10-CM | POA: Diagnosis present

## 2021-08-06 DIAGNOSIS — N189 Chronic kidney disease, unspecified: Secondary | ICD-10-CM | POA: Diagnosis not present

## 2021-08-06 DIAGNOSIS — I214 Non-ST elevation (NSTEMI) myocardial infarction: Secondary | ICD-10-CM | POA: Diagnosis present

## 2021-08-06 DIAGNOSIS — T829XXA Unspecified complication of cardiac and vascular prosthetic device, implant and graft, initial encounter: Secondary | ICD-10-CM | POA: Diagnosis not present

## 2021-08-06 DIAGNOSIS — R112 Nausea with vomiting, unspecified: Secondary | ICD-10-CM | POA: Diagnosis present

## 2021-08-06 DIAGNOSIS — I5042 Chronic combined systolic (congestive) and diastolic (congestive) heart failure: Secondary | ICD-10-CM | POA: Diagnosis present

## 2021-08-06 DIAGNOSIS — R5381 Other malaise: Secondary | ICD-10-CM | POA: Diagnosis not present

## 2021-08-06 DIAGNOSIS — E1151 Type 2 diabetes mellitus with diabetic peripheral angiopathy without gangrene: Secondary | ICD-10-CM | POA: Diagnosis present

## 2021-08-06 DIAGNOSIS — I700 Atherosclerosis of aorta: Secondary | ICD-10-CM | POA: Diagnosis not present

## 2021-08-06 DIAGNOSIS — R748 Abnormal levels of other serum enzymes: Secondary | ICD-10-CM | POA: Diagnosis not present

## 2021-08-06 DIAGNOSIS — E11649 Type 2 diabetes mellitus with hypoglycemia without coma: Secondary | ICD-10-CM | POA: Diagnosis not present

## 2021-08-06 LAB — BASIC METABOLIC PANEL
Anion gap: 22 — ABNORMAL HIGH (ref 5–15)
BUN: 39 mg/dL — ABNORMAL HIGH (ref 8–23)
CO2: 19 mmol/L — ABNORMAL LOW (ref 22–32)
Calcium: 8.9 mg/dL (ref 8.9–10.3)
Chloride: 96 mmol/L — ABNORMAL LOW (ref 98–111)
Creatinine, Ser: 9.67 mg/dL — ABNORMAL HIGH (ref 0.61–1.24)
GFR, Estimated: 5 mL/min — ABNORMAL LOW (ref >60)
Glucose, Bld: 201 mg/dL — ABNORMAL HIGH (ref 70–99)
Potassium: 5.2 mmol/L — ABNORMAL HIGH (ref 3.5–5.1)
Sodium: 137 mmol/L (ref 135–145)

## 2021-08-06 LAB — CBC WITH DIFFERENTIAL/PLATELET
Abs Immature Granulocytes: 0.02 10*3/uL (ref 0.00–0.07)
Basophils Absolute: 0 10*3/uL (ref 0.0–0.1)
Basophils Relative: 1 %
Eosinophils Absolute: 0 10*3/uL (ref 0.0–0.5)
Eosinophils Relative: 0 %
HCT: 32.2 % — ABNORMAL LOW (ref 39.0–52.0)
Hemoglobin: 9.9 g/dL — ABNORMAL LOW (ref 13.0–17.0)
Immature Granulocytes: 0 %
Lymphocytes Relative: 11 %
Lymphs Abs: 0.7 10*3/uL (ref 0.7–4.0)
MCH: 30.5 pg (ref 26.0–34.0)
MCHC: 30.7 g/dL (ref 30.0–36.0)
MCV: 99.1 fL (ref 80.0–100.0)
Monocytes Absolute: 0.7 10*3/uL (ref 0.1–1.0)
Monocytes Relative: 12 %
Neutro Abs: 4.5 10*3/uL (ref 1.7–7.7)
Neutrophils Relative %: 76 %
Platelets: 173 10*3/uL (ref 150–400)
RBC: 3.25 MIL/uL — ABNORMAL LOW (ref 4.22–5.81)
RDW: 16.3 % — ABNORMAL HIGH (ref 11.5–15.5)
WBC: 6 10*3/uL (ref 4.0–10.5)
nRBC: 0 % (ref 0.0–0.2)

## 2021-08-06 MED ORDER — ONDANSETRON HCL 4 MG/2ML IJ SOLN
4.0000 mg | Freq: Once | INTRAMUSCULAR | Status: AC
  Administered 2021-08-06: 4 mg via INTRAVENOUS
  Filled 2021-08-06: qty 2

## 2021-08-06 MED ORDER — ONDANSETRON 4 MG PO TBDP: 4.0000 mg | ORAL_TABLET | Freq: Three times a day (TID) | ORAL | 0 refills | Status: AC | PRN

## 2021-08-06 NOTE — Discharge Instructions (Addendum)
Call your primary care doctor or specialist as discussed in the next 2-3 days.   Return immediately back to the ER if:  Your symptoms worsen within the next 12-24 hours. You develop new symptoms such as new fevers, persistent vomiting, new pain, shortness of breath, or new weakness or numbness, or if you have any other concerns.  

## 2021-08-06 NOTE — ED Triage Notes (Signed)
Vomited x 4 episodes at dialysis center before dialysis. Alert and oriented x 4. Ate breakfast this AM. CBG 252. Pt has a pacemaker.
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
## 2021-08-06 NOTE — Progress Notes (Signed)
Contacted by ED MD regarding pt's need for out-pt HD treatment tomorrow due to missed treatment today. Spoke to [employer], Agricultural consultant, at [facility]. Pt can receive treatment tomorrow. Pt will need to arrive at 8:40 for 9:00 chair time. MD provided info and to provide info to pt as well. Documented on AVS.     Melven Sartorius Renal Navigator [phone]

## 2021-08-07 ENCOUNTER — Ambulatory Visit: Payer: Medicare PPO | Admitting: Sports Medicine

## 2021-08-07 ENCOUNTER — Inpatient Hospital Stay (HOSPITAL_COMMUNITY)
Admission: EM | Admit: 2021-08-07 | Discharge: 2021-09-11 | DRG: 673 | Disposition: E | Payer: Medicare PPO | Attending: Family Medicine | Admitting: Family Medicine

## 2021-08-07 ENCOUNTER — Encounter (HOSPITAL_COMMUNITY): Payer: Self-pay | Admitting: Emergency Medicine

## 2021-08-07 ENCOUNTER — Emergency Department (HOSPITAL_COMMUNITY): Payer: Medicare PPO

## 2021-08-07 DIAGNOSIS — D62 Acute posthemorrhagic anemia: Secondary | ICD-10-CM | POA: Diagnosis not present

## 2021-08-07 DIAGNOSIS — N186 End stage renal disease: Secondary | ICD-10-CM | POA: Diagnosis present

## 2021-08-07 DIAGNOSIS — R4182 Altered mental status, unspecified: Secondary | ICD-10-CM

## 2021-08-07 DIAGNOSIS — M898X9 Other specified disorders of bone, unspecified site: Secondary | ICD-10-CM | POA: Diagnosis present

## 2021-08-07 DIAGNOSIS — E11649 Type 2 diabetes mellitus with hypoglycemia without coma: Secondary | ICD-10-CM | POA: Diagnosis not present

## 2021-08-07 DIAGNOSIS — T8241XA Breakdown (mechanical) of vascular dialysis catheter, initial encounter: Principal | ICD-10-CM | POA: Diagnosis present

## 2021-08-07 DIAGNOSIS — Z66 Do not resuscitate: Secondary | ICD-10-CM | POA: Diagnosis not present

## 2021-08-07 DIAGNOSIS — F03918 Unspecified dementia, unspecified severity, with other behavioral disturbance: Secondary | ICD-10-CM | POA: Diagnosis present

## 2021-08-07 DIAGNOSIS — R748 Abnormal levels of other serum enzymes: Secondary | ICD-10-CM

## 2021-08-07 DIAGNOSIS — K760 Fatty (change of) liver, not elsewhere classified: Secondary | ICD-10-CM | POA: Diagnosis present

## 2021-08-07 DIAGNOSIS — Z9115 Patient's noncompliance with renal dialysis: Secondary | ICD-10-CM

## 2021-08-07 DIAGNOSIS — I472 Ventricular tachycardia, unspecified: Secondary | ICD-10-CM | POA: Diagnosis present

## 2021-08-07 DIAGNOSIS — K7200 Acute and subacute hepatic failure without coma: Secondary | ICD-10-CM | POA: Diagnosis present

## 2021-08-07 DIAGNOSIS — N2581 Secondary hyperparathyroidism of renal origin: Secondary | ICD-10-CM | POA: Diagnosis present

## 2021-08-07 DIAGNOSIS — I441 Atrioventricular block, second degree: Secondary | ICD-10-CM | POA: Diagnosis present

## 2021-08-07 DIAGNOSIS — D869 Sarcoidosis, unspecified: Secondary | ICD-10-CM | POA: Diagnosis present

## 2021-08-07 DIAGNOSIS — K5900 Constipation, unspecified: Secondary | ICD-10-CM | POA: Diagnosis not present

## 2021-08-07 DIAGNOSIS — E7800 Pure hypercholesterolemia, unspecified: Secondary | ICD-10-CM | POA: Diagnosis present

## 2021-08-07 DIAGNOSIS — F0390 Unspecified dementia without behavioral disturbance: Secondary | ICD-10-CM | POA: Diagnosis present

## 2021-08-07 DIAGNOSIS — E871 Hypo-osmolality and hyponatremia: Secondary | ICD-10-CM | POA: Diagnosis not present

## 2021-08-07 DIAGNOSIS — K761 Chronic passive congestion of liver: Secondary | ICD-10-CM | POA: Diagnosis present

## 2021-08-07 DIAGNOSIS — A419 Sepsis, unspecified organism: Secondary | ICD-10-CM | POA: Diagnosis present

## 2021-08-07 DIAGNOSIS — K219 Gastro-esophageal reflux disease without esophagitis: Secondary | ICD-10-CM | POA: Diagnosis present

## 2021-08-07 DIAGNOSIS — I447 Left bundle-branch block, unspecified: Secondary | ICD-10-CM | POA: Diagnosis present

## 2021-08-07 DIAGNOSIS — Y712 Prosthetic and other implants, materials and accessory cardiovascular devices associated with adverse incidents: Secondary | ICD-10-CM | POA: Diagnosis present

## 2021-08-07 DIAGNOSIS — Z951 Presence of aortocoronary bypass graft: Secondary | ICD-10-CM

## 2021-08-07 DIAGNOSIS — E1122 Type 2 diabetes mellitus with diabetic chronic kidney disease: Secondary | ICD-10-CM | POA: Diagnosis present

## 2021-08-07 DIAGNOSIS — Z20822 Contact with and (suspected) exposure to covid-19: Secondary | ICD-10-CM | POA: Diagnosis present

## 2021-08-07 DIAGNOSIS — D631 Anemia in chronic kidney disease: Secondary | ICD-10-CM | POA: Diagnosis present

## 2021-08-07 DIAGNOSIS — T829XXA Unspecified complication of cardiac and vascular prosthetic device, implant and graft, initial encounter: Secondary | ICD-10-CM | POA: Diagnosis present

## 2021-08-07 DIAGNOSIS — R680 Hypothermia, not associated with low environmental temperature: Secondary | ICD-10-CM | POA: Diagnosis not present

## 2021-08-07 DIAGNOSIS — Z8616 Personal history of COVID-19: Secondary | ICD-10-CM

## 2021-08-07 DIAGNOSIS — I5042 Chronic combined systolic (congestive) and diastolic (congestive) heart failure: Secondary | ICD-10-CM | POA: Diagnosis present

## 2021-08-07 DIAGNOSIS — K920 Hematemesis: Secondary | ICD-10-CM | POA: Diagnosis not present

## 2021-08-07 DIAGNOSIS — Z7982 Long term (current) use of aspirin: Secondary | ICD-10-CM

## 2021-08-07 DIAGNOSIS — Z79899 Other long term (current) drug therapy: Secondary | ICD-10-CM

## 2021-08-07 DIAGNOSIS — E875 Hyperkalemia: Secondary | ICD-10-CM | POA: Diagnosis present

## 2021-08-07 DIAGNOSIS — E872 Acidosis, unspecified: Secondary | ICD-10-CM | POA: Diagnosis present

## 2021-08-07 DIAGNOSIS — E1151 Type 2 diabetes mellitus with diabetic peripheral angiopathy without gangrene: Secondary | ICD-10-CM | POA: Diagnosis present

## 2021-08-07 DIAGNOSIS — I2510 Atherosclerotic heart disease of native coronary artery without angina pectoris: Secondary | ICD-10-CM | POA: Diagnosis present

## 2021-08-07 DIAGNOSIS — K921 Melena: Secondary | ICD-10-CM | POA: Diagnosis present

## 2021-08-07 DIAGNOSIS — I132 Hypertensive heart and chronic kidney disease with heart failure and with stage 5 chronic kidney disease, or end stage renal disease: Secondary | ICD-10-CM | POA: Diagnosis present

## 2021-08-07 DIAGNOSIS — K7682 Hepatic encephalopathy: Secondary | ICD-10-CM | POA: Diagnosis present

## 2021-08-07 DIAGNOSIS — I214 Non-ST elevation (NSTEMI) myocardial infarction: Secondary | ICD-10-CM | POA: Diagnosis present

## 2021-08-07 DIAGNOSIS — Z888 Allergy status to other drugs, medicaments and biological substances status: Secondary | ICD-10-CM

## 2021-08-07 LAB — COMPREHENSIVE METABOLIC PANEL
ALT: 310 U/L — ABNORMAL HIGH (ref 0–44)
AST: 522 U/L — ABNORMAL HIGH (ref 15–41)
Albumin: 3.5 g/dL (ref 3.5–5.0)
Alkaline Phosphatase: 100 U/L (ref 38–126)
Anion gap: 29 — ABNORMAL HIGH (ref 5–15)
BUN: 57 mg/dL — ABNORMAL HIGH (ref 8–23)
CO2: 15 mmol/L — ABNORMAL LOW (ref 22–32)
Calcium: 9.2 mg/dL (ref 8.9–10.3)
Chloride: 93 mmol/L — ABNORMAL LOW (ref 98–111)
Creatinine, Ser: 12.04 mg/dL — ABNORMAL HIGH (ref 0.61–1.24)
GFR, Estimated: 4 mL/min — ABNORMAL LOW (ref >60)
Glucose, Bld: 150 mg/dL — ABNORMAL HIGH (ref 70–99)
Potassium: 5.4 mmol/L — ABNORMAL HIGH (ref 3.5–5.1)
Sodium: 137 mmol/L (ref 135–145)
Total Bilirubin: 1.4 mg/dL — ABNORMAL HIGH (ref 0.3–1.2)
Total Protein: 7.1 g/dL (ref 6.5–8.1)

## 2021-08-07 LAB — CBC WITH DIFFERENTIAL/PLATELET
Abs Immature Granulocytes: 0.13 10*3/uL — ABNORMAL HIGH (ref 0.00–0.07)
Basophils Absolute: 0 10*3/uL (ref 0.0–0.1)
Basophils Relative: 0 %
Eosinophils Absolute: 0 10*3/uL (ref 0.0–0.5)
Eosinophils Relative: 0 %
HCT: 33.1 % — ABNORMAL LOW (ref 39.0–52.0)
Hemoglobin: 10.5 g/dL — ABNORMAL LOW (ref 13.0–17.0)
Immature Granulocytes: 1 %
Lymphocytes Relative: 3 %
Lymphs Abs: 0.7 10*3/uL (ref 0.7–4.0)
MCH: 30.6 pg (ref 26.0–34.0)
MCHC: 31.7 g/dL (ref 30.0–36.0)
MCV: 96.5 fL (ref 80.0–100.0)
Monocytes Absolute: 2.4 10*3/uL — ABNORMAL HIGH (ref 0.1–1.0)
Monocytes Relative: 12 %
Neutro Abs: 16.4 10*3/uL — ABNORMAL HIGH (ref 1.7–7.7)
Neutrophils Relative %: 84 %
Platelets: 190 10*3/uL (ref 150–400)
RBC: 3.43 MIL/uL — ABNORMAL LOW (ref 4.22–5.81)
RDW: 16.9 % — ABNORMAL HIGH (ref 11.5–15.5)
WBC: 19.6 10*3/uL — ABNORMAL HIGH (ref 4.0–10.5)
nRBC: 0.1 % (ref 0.0–0.2)

## 2021-08-07 LAB — LIPASE, BLOOD: Lipase: 47 U/L (ref 11–51)

## 2021-08-07 NOTE — ED Provider Notes (Signed)
Emergency Medicine Provider Triage Evaluation Note  William [name] , a 80 y.o. male  was evaluated in triage.  Pt complains of emesis, weakness, missed dialysis.  Patient was seen here yesterday for emesis and weakness.  Was please get dialysis yesterday however due to being in the emergency department was not able to attend.  He went to dialysis today for his make-up session.  He felt weak and had persistent nausea and NBNB emesis.  According to facility unable to complete his dialysis.  Patient has a fistula in the right thigh.  Denies any abdominal pain, diarrhea.  States he makes urine once daily in the morning.  Review of Systems  Positive: Dialysis patient, weakness, emesis Negative: Abdominal pain  Physical Exam  There were no vitals taken for this visit. Gen:   Awake, no distress   Resp:  Normal effort  ABD:  soft MSK:   Moves extremities without difficulty  Other:    Medical Decision Making  Medically screening exam initiated at 4:31 PM.  Appropriate orders placed.  William [name] was informed that the remainder of the evaluation will be completed by another provider, this initial triage assessment does not replace that evaluation, and the importance of remaining in the ED until their evaluation is complete.  Dialysis patient comes in for weakness, emesis.  No headache, head trauma.  Apparently 2 missed dialysis sessions due to emesis and weakness.  He has right thigh graft, unable to assess due to patient placed in hallway bed for medical screening exam.  Will start labs, imaging   Zanovia Rotz A, PA-C 08/08/2021 1633    Malvin Johns, MD 08/08/2021 1709

## 2021-08-07 NOTE — ED Triage Notes (Signed)
Patient BIB [facility] from dialysis for weakness and emesis, dialysis facility was unable to access fistula today. Fistula in right thigh.

## 2021-08-08 ENCOUNTER — Observation Stay (HOSPITAL_COMMUNITY): Payer: Medicare PPO

## 2021-08-08 ENCOUNTER — Other Ambulatory Visit: Payer: Self-pay

## 2021-08-08 ENCOUNTER — Inpatient Hospital Stay (HOSPITAL_COMMUNITY): Payer: Medicare PPO

## 2021-08-08 DIAGNOSIS — I132 Hypertensive heart and chronic kidney disease with heart failure and with stage 5 chronic kidney disease, or end stage renal disease: Secondary | ICD-10-CM | POA: Diagnosis present

## 2021-08-08 DIAGNOSIS — K7200 Acute and subacute hepatic failure without coma: Secondary | ICD-10-CM | POA: Diagnosis present

## 2021-08-08 DIAGNOSIS — Z8616 Personal history of COVID-19: Secondary | ICD-10-CM | POA: Diagnosis not present

## 2021-08-08 DIAGNOSIS — Z20822 Contact with and (suspected) exposure to covid-19: Secondary | ICD-10-CM | POA: Diagnosis present

## 2021-08-08 DIAGNOSIS — R748 Abnormal levels of other serum enzymes: Secondary | ICD-10-CM | POA: Diagnosis not present

## 2021-08-08 DIAGNOSIS — A419 Sepsis, unspecified organism: Secondary | ICD-10-CM | POA: Diagnosis present

## 2021-08-08 DIAGNOSIS — K761 Chronic passive congestion of liver: Secondary | ICD-10-CM | POA: Diagnosis present

## 2021-08-08 DIAGNOSIS — E1151 Type 2 diabetes mellitus with diabetic peripheral angiopathy without gangrene: Secondary | ICD-10-CM | POA: Diagnosis present

## 2021-08-08 DIAGNOSIS — E871 Hypo-osmolality and hyponatremia: Secondary | ICD-10-CM | POA: Diagnosis not present

## 2021-08-08 DIAGNOSIS — R4182 Altered mental status, unspecified: Secondary | ICD-10-CM

## 2021-08-08 DIAGNOSIS — F03918 Unspecified dementia, unspecified severity, with other behavioral disturbance: Secondary | ICD-10-CM | POA: Diagnosis present

## 2021-08-08 DIAGNOSIS — E11649 Type 2 diabetes mellitus with hypoglycemia without coma: Secondary | ICD-10-CM | POA: Diagnosis not present

## 2021-08-08 DIAGNOSIS — I214 Non-ST elevation (NSTEMI) myocardial infarction: Secondary | ICD-10-CM | POA: Diagnosis present

## 2021-08-08 DIAGNOSIS — I5042 Chronic combined systolic (congestive) and diastolic (congestive) heart failure: Secondary | ICD-10-CM | POA: Diagnosis present

## 2021-08-08 DIAGNOSIS — T8241XA Breakdown (mechanical) of vascular dialysis catheter, initial encounter: Secondary | ICD-10-CM | POA: Diagnosis present

## 2021-08-08 DIAGNOSIS — Z66 Do not resuscitate: Secondary | ICD-10-CM | POA: Diagnosis not present

## 2021-08-08 DIAGNOSIS — E1122 Type 2 diabetes mellitus with diabetic chronic kidney disease: Secondary | ICD-10-CM | POA: Diagnosis present

## 2021-08-08 DIAGNOSIS — N186 End stage renal disease: Secondary | ICD-10-CM | POA: Diagnosis present

## 2021-08-08 DIAGNOSIS — E872 Acidosis, unspecified: Secondary | ICD-10-CM | POA: Diagnosis present

## 2021-08-08 DIAGNOSIS — K760 Fatty (change of) liver, not elsewhere classified: Secondary | ICD-10-CM | POA: Diagnosis not present

## 2021-08-08 DIAGNOSIS — D631 Anemia in chronic kidney disease: Secondary | ICD-10-CM | POA: Diagnosis present

## 2021-08-08 DIAGNOSIS — N2581 Secondary hyperparathyroidism of renal origin: Secondary | ICD-10-CM | POA: Diagnosis present

## 2021-08-08 DIAGNOSIS — Y712 Prosthetic and other implants, materials and accessory cardiovascular devices associated with adverse incidents: Secondary | ICD-10-CM | POA: Diagnosis present

## 2021-08-08 DIAGNOSIS — T829XXA Unspecified complication of cardiac and vascular prosthetic device, implant and graft, initial encounter: Secondary | ICD-10-CM | POA: Diagnosis present

## 2021-08-08 DIAGNOSIS — I472 Ventricular tachycardia, unspecified: Secondary | ICD-10-CM | POA: Diagnosis present

## 2021-08-08 DIAGNOSIS — K7682 Hepatic encephalopathy: Secondary | ICD-10-CM | POA: Diagnosis present

## 2021-08-08 DIAGNOSIS — K920 Hematemesis: Secondary | ICD-10-CM | POA: Diagnosis not present

## 2021-08-08 DIAGNOSIS — R112 Nausea with vomiting, unspecified: Secondary | ICD-10-CM | POA: Diagnosis present

## 2021-08-08 DIAGNOSIS — D62 Acute posthemorrhagic anemia: Secondary | ICD-10-CM | POA: Diagnosis not present

## 2021-08-08 HISTORY — PX: IR FLUORO GUIDE CV LINE RIGHT: IMG2283

## 2021-08-08 HISTORY — PX: IR PTA ADDL CENTRAL DIALYSIS SEG THRU DIALY CIRCUIT RIGHT: IMG6121

## 2021-08-08 HISTORY — PX: IR VENOCAVAGRAM IVC: IMG678

## 2021-08-08 LAB — CBC
HCT: 28.9 % — ABNORMAL LOW (ref 39.0–52.0)
HCT: 29.9 % — ABNORMAL LOW (ref 39.0–52.0)
Hemoglobin: 9.6 g/dL — ABNORMAL LOW (ref 13.0–17.0)
Hemoglobin: 9.7 g/dL — ABNORMAL LOW (ref 13.0–17.0)
MCH: 31.1 pg (ref 26.0–34.0)
MCH: 30.8 pg (ref 26.0–34.0)
MCHC: 33.2 g/dL (ref 30.0–36.0)
MCHC: 32.4 g/dL (ref 30.0–36.0)
MCV: 93.5 fL (ref 80.0–100.0)
MCV: 94.9 fL (ref 80.0–100.0)
Platelets: 162 10*3/uL (ref 150–400)
Platelets: 161 10*3/uL (ref 150–400)
RBC: 3.09 MIL/uL — ABNORMAL LOW (ref 4.22–5.81)
RBC: 3.15 MIL/uL — ABNORMAL LOW (ref 4.22–5.81)
RDW: 16.7 % — ABNORMAL HIGH (ref 11.5–15.5)
RDW: 16.7 % — ABNORMAL HIGH (ref 11.5–15.5)
WBC: 15.4 10*3/uL — ABNORMAL HIGH (ref 4.0–10.5)
WBC: 12.2 10*3/uL — ABNORMAL HIGH (ref 4.0–10.5)
nRBC: 0.3 % — ABNORMAL HIGH (ref 0.0–0.2)
nRBC: 0.4 % — ABNORMAL HIGH (ref 0.0–0.2)

## 2021-08-08 LAB — COMPREHENSIVE METABOLIC PANEL
ALT: 890 U/L — ABNORMAL HIGH (ref 0–44)
ALT: 1029 U/L — ABNORMAL HIGH (ref 0–44)
AST: 1622 U/L — ABNORMAL HIGH (ref 15–41)
AST: 1463 U/L — ABNORMAL HIGH (ref 15–41)
Albumin: 3.4 g/dL — ABNORMAL LOW (ref 3.5–5.0)
Albumin: 3.4 g/dL — ABNORMAL LOW (ref 3.5–5.0)
Alkaline Phosphatase: 93 U/L (ref 38–126)
Alkaline Phosphatase: 103 U/L (ref 38–126)
Anion gap: 23 — ABNORMAL HIGH (ref 5–15)
Anion gap: 25 — ABNORMAL HIGH (ref 5–15)
BUN: 72 mg/dL — ABNORMAL HIGH (ref 8–23)
BUN: 82 mg/dL — ABNORMAL HIGH (ref 8–23)
CO2: 21 mmol/L — ABNORMAL LOW (ref 22–32)
CO2: 18 mmol/L — ABNORMAL LOW (ref 22–32)
Calcium: 9 mg/dL (ref 8.9–10.3)
Calcium: 8.8 mg/dL — ABNORMAL LOW (ref 8.9–10.3)
Chloride: 92 mmol/L — ABNORMAL LOW (ref 98–111)
Chloride: 94 mmol/L — ABNORMAL LOW (ref 98–111)
Creatinine, Ser: 13.27 mg/dL — ABNORMAL HIGH (ref 0.61–1.24)
Creatinine, Ser: 13.2 mg/dL — ABNORMAL HIGH (ref 0.61–1.24)
GFR, Estimated: 3 mL/min — ABNORMAL LOW (ref >60)
GFR, Estimated: 3 mL/min — ABNORMAL LOW (ref >60)
Glucose, Bld: 124 mg/dL — ABNORMAL HIGH (ref 70–99)
Glucose, Bld: 65 mg/dL — ABNORMAL LOW (ref 70–99)
Potassium: 5.6 mmol/L — ABNORMAL HIGH (ref 3.5–5.1)
Potassium: 5.5 mmol/L — ABNORMAL HIGH (ref 3.5–5.1)
Sodium: 136 mmol/L (ref 135–145)
Sodium: 137 mmol/L (ref 135–145)
Total Bilirubin: 0.9 mg/dL (ref 0.3–1.2)
Total Bilirubin: 1.7 mg/dL — ABNORMAL HIGH (ref 0.3–1.2)
Total Protein: 6.7 g/dL (ref 6.5–8.1)
Total Protein: 6.8 g/dL (ref 6.5–8.1)

## 2021-08-08 LAB — RENAL FUNCTION PANEL
Albumin: 3.3 g/dL — ABNORMAL LOW (ref 3.5–5.0)
Anion gap: 22 — ABNORMAL HIGH (ref 5–15)
BUN: 77 mg/dL — ABNORMAL HIGH (ref 8–23)
CO2: 20 mmol/L — ABNORMAL LOW (ref 22–32)
Calcium: 8.7 mg/dL — ABNORMAL LOW (ref 8.9–10.3)
Chloride: 93 mmol/L — ABNORMAL LOW (ref 98–111)
Creatinine, Ser: 12.89 mg/dL — ABNORMAL HIGH (ref 0.61–1.24)
GFR, Estimated: 4 mL/min — ABNORMAL LOW (ref >60)
Glucose, Bld: 67 mg/dL — ABNORMAL LOW (ref 70–99)
Phosphorus: 8 mg/dL — ABNORMAL HIGH (ref 2.5–4.6)
Potassium: 5.4 mmol/L — ABNORMAL HIGH (ref 3.5–5.1)
Sodium: 135 mmol/L (ref 135–145)

## 2021-08-08 LAB — RESP PANEL BY RT-PCR (FLU A&B, COVID) ARPGX2
Influenza A by PCR: NEGATIVE
Influenza B by PCR: NEGATIVE
SARS Coronavirus 2 by RT PCR: NEGATIVE

## 2021-08-08 LAB — GLUCOSE, CAPILLARY
Glucose-Capillary: 57 mg/dL — ABNORMAL LOW (ref 70–99)
Glucose-Capillary: 60 mg/dL — ABNORMAL LOW (ref 70–99)
Glucose-Capillary: 91 mg/dL (ref 70–99)

## 2021-08-08 LAB — CK: Total CK: 225 U/L (ref 49–397)

## 2021-08-08 LAB — LACTIC ACID, PLASMA
Lactic Acid, Venous: 6.6 mmol/L (ref 0.5–1.9)
Lactic Acid, Venous: 4.7 mmol/L (ref 0.5–1.9)
Lactic Acid, Venous: 2.8 mmol/L (ref 0.5–1.9)

## 2021-08-08 LAB — HEPATITIS PANEL, ACUTE
HCV Ab: NONREACTIVE
Hep A IgM: NONREACTIVE
Hep B C IgM: NONREACTIVE
Hepatitis B Surface Ag: NONREACTIVE

## 2021-08-08 LAB — PHOSPHORUS: Phosphorus: 9.1 mg/dL — ABNORMAL HIGH (ref 2.5–4.6)

## 2021-08-08 LAB — HEPATITIS B SURFACE ANTIGEN: Hepatitis B Surface Ag: NONREACTIVE

## 2021-08-08 LAB — BETA-HYDROXYBUTYRIC ACID: Beta-Hydroxybutyric Acid: 0.39 mmol/L — ABNORMAL HIGH (ref 0.05–0.27)

## 2021-08-08 LAB — ACETAMINOPHEN LEVEL: Acetaminophen (Tylenol), Serum: <10 ug/mL — ABNORMAL LOW (ref 10–30)

## 2021-08-08 LAB — ETHANOL: Alcohol, Ethyl (B): <10 mg/dL (ref <10)

## 2021-08-08 LAB — HEPATITIS B SURFACE ANTIBODY,QUALITATIVE: Hep B S Ab: REACTIVE — AB

## 2021-08-08 MED ORDER — IOHEXOL 300 MG/ML  SOLN
100.0000 mL | Freq: Once | INTRAMUSCULAR | Status: AC | PRN
  Administered 2021-08-08: 35 mL via INTRAVENOUS

## 2021-08-08 MED ORDER — CHLORHEXIDINE GLUCONATE CLOTH 2 % EX PADS
6.0000 | MEDICATED_PAD | Freq: Every day | CUTANEOUS | Status: DC
  Administered 2021-08-08 – 2021-08-13 (×6): 6 via TOPICAL

## 2021-08-08 MED ORDER — MIDAZOLAM HCL 2 MG/2ML IJ SOLN
INTRAMUSCULAR | Status: AC
  Filled 2021-08-08: qty 2

## 2021-08-08 MED ORDER — HEPARIN SODIUM (PORCINE) 5000 UNIT/ML IJ SOLN
5000.0000 [IU] | Freq: Three times a day (TID) | INTRAMUSCULAR | Status: DC
  Administered 2021-08-08 – 2021-08-12 (×13): 5000 [IU] via SUBCUTANEOUS
  Filled 2021-08-08 (×13): qty 1

## 2021-08-08 MED ORDER — ATORVASTATIN CALCIUM 40 MG PO TABS: 40.0000 mg | ORAL_TABLET | Freq: Every day | ORAL | Status: DC

## 2021-08-08 MED ORDER — VANCOMYCIN HCL IN DEXTROSE 1-5 GM/200ML-% IV SOLN
1000.0000 mg | INTRAVENOUS | Status: DC
  Filled 2021-08-08: qty 200

## 2021-08-08 MED ORDER — VANCOMYCIN HCL 2000 MG/400ML IV SOLN
2000.0000 mg | Freq: Once | INTRAVENOUS | Status: AC
  Administered 2021-08-08: 2000 mg via INTRAVENOUS
  Filled 2021-08-08: qty 400

## 2021-08-08 MED ORDER — SODIUM BICARBONATE 650 MG PO TABS: 1300.0000 mg | ORAL_TABLET | Freq: Once | ORAL | Status: DC

## 2021-08-08 MED ORDER — ONDANSETRON HCL 4 MG/2ML IJ SOLN
4.0000 mg | Freq: Once | INTRAMUSCULAR | Status: AC
  Administered 2021-08-08: 4 mg via INTRAVENOUS
  Filled 2021-08-08: qty 2

## 2021-08-08 MED ORDER — HEPARIN SODIUM (PORCINE) 1000 UNIT/ML IJ SOLN
INTRAMUSCULAR | Status: AC
  Filled 2021-08-08: qty 1

## 2021-08-08 MED ORDER — METOPROLOL TARTRATE 12.5 MG HALF TABLET
12.5000 mg | ORAL_TABLET | Freq: Two times a day (BID) | ORAL | Status: DC
  Administered 2021-08-08 – 2021-08-12 (×8): 12.5 mg via ORAL
  Filled 2021-08-08 (×8): qty 1

## 2021-08-08 MED ORDER — LIDOCAINE HCL 1 % IJ SOLN
INTRAMUSCULAR | Status: AC | PRN
  Administered 2021-08-08: 10 mL via INTRADERMAL

## 2021-08-08 MED ORDER — SODIUM CHLORIDE 0.9 % IV BOLUS
500.0000 mL | Freq: Once | INTRAVENOUS | Status: AC
  Administered 2021-08-08: 500 mL via INTRAVENOUS

## 2021-08-08 MED ORDER — ACETAMINOPHEN 650 MG RE SUPP: 650.0000 mg | Freq: Four times a day (QID) | RECTAL | Status: DC | PRN

## 2021-08-08 MED ORDER — FENTANYL CITRATE (PF) 100 MCG/2ML IJ SOLN
INTRAMUSCULAR | Status: AC
  Filled 2021-08-08: qty 2

## 2021-08-08 MED ORDER — FENTANYL CITRATE (PF) 100 MCG/2ML IJ SOLN
INTRAMUSCULAR | Status: AC | PRN
  Administered 2021-08-08: 25 ug via INTRAVENOUS

## 2021-08-08 MED ORDER — LIDOCAINE HCL 1 % IJ SOLN
INTRAMUSCULAR | Status: AC
  Filled 2021-08-08: qty 20

## 2021-08-08 MED ORDER — SODIUM ZIRCONIUM CYCLOSILICATE 10 G PO PACK
10.0000 g | PACK | Freq: Once | ORAL | Status: AC
  Administered 2021-08-08: 10 g via ORAL
  Filled 2021-08-08: qty 1

## 2021-08-08 MED ORDER — VANCOMYCIN VARIABLE DOSE PER UNSTABLE RENAL FUNCTION (PHARMACIST DOSING): Status: DC

## 2021-08-08 MED ORDER — ACETAMINOPHEN 325 MG PO TABS
650.0000 mg | ORAL_TABLET | Freq: Four times a day (QID) | ORAL | Status: DC | PRN
Start: 2021-08-08 — End: 2021-08-08

## 2021-08-08 MED ORDER — SEVELAMER CARBONATE 800 MG PO TABS
800.0000 mg | ORAL_TABLET | Freq: Three times a day (TID) | ORAL | Status: DC
  Administered 2021-08-08 – 2021-08-12 (×12): 800 mg via ORAL
  Filled 2021-08-08 (×13): qty 1

## 2021-08-08 MED ORDER — ASPIRIN 81 MG PO CHEW
81.0000 mg | CHEWABLE_TABLET | Freq: Every day | ORAL | Status: DC
  Administered 2021-08-08 – 2021-08-12 (×5): 81 mg via ORAL
  Filled 2021-08-08 (×5): qty 1

## 2021-08-08 NOTE — Progress Notes (Addendum)
Spoke with Dr Dwaine Gale, right Femoral HD cath exchanged and working, ready for HD. Recommended Triple phase CT liver if continues to have elevation in liver enzymes given significant stenosis and possible concern for potential obstruction of hepatic system.  Carollee Leitz, MD Family Medicine Residency

## 2021-08-08 NOTE — ED Notes (Signed)
Patient transported to Ultrasound 

## 2021-08-08 NOTE — ED Notes (Addendum)
Pt remains in IR. IR called to inquire if transport can take the pt to their inpatient room after procedure. IR confirmed. Removed pt from ED tracker. Pt's belongings sent to room [phone]).
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
## 2021-08-08 NOTE — Progress Notes (Signed)
Pharmacy Antibiotic Note  William [name] is a 80 y.o. male admitted on 08/09/2021 with Vas-Cath failure.  Pharmacy has been consulted for vancomycin dosing for possible sepsis with suspicion for bacteremia. Patient has a history of MRSE bacteremia in June of this year. Patient is ESRD on HD MWF. Patient has elevated WBC count but remains afebrile. Blood and urine cultures are pending.   Plan: -Vancomycin 2,000 mg IV loading dose.  -Vancomycin 1,000 mg IV dose following HD session.  -Follow-up on HD schedule.  -Target pre-HD level of 15-25 mcg/mL.  -Will check vancomycin levels as clinically indicated.     Temp (24hrs), Avg:98.1 F (36.7 C), Min:98 F (36.7 C), Max:98.1 F (36.7 C)  Recent Labs  Lab 08/06/21 1258 07/18/2021 1645 08/08/21 0343 08/08/21 0526 08/08/21 0651  WBC 6.0 19.6*  --  15.4*  --   CREATININE 9.67* 12.04*  --  13.27*  --   LATICACIDVEN  --   --  6.6*  --  4.7*    Estimated Creatinine Clearance: 4.8 mL/min (A) (by C-G formula based on SCr of 13.27 mg/dL (H)).    Allergies  Allergen Reactions   Carvedilol Other (See Comments)    Makes him feel like his pelvis is "grinding" (??) Patient doesn't recall this, however   Antimicrobials this admission: Vancomycin 10/28 >>  Microbiology results: 10/28 BCx: pending 10/28 UCx: pending  Thank you for allowing pharmacy to be a part of this patient's care.  Elsie Amis, PharmD Candidate  08/08/2021 1:00 PM

## 2021-08-08 NOTE — Progress Notes (Addendum)
Family Medicine Teaching Service Daily Progress Note Intern Pager: [phone]  Patient name: William [name] Medical record number: [id number] Date of birth: 10-24-1940 Age: 80 y.o. Gender: male  Primary Care Provider: Rise Patience, DO Consultants: IR Code Status: Full  Pt Overview and Major Events to Date:  10/28- Admitted, met sepsis criteria  Assessment and Plan: William [name] is an 80 year old male who presented with vascath failure in setting of sepsis criteria, and now worsening transaminitis. PMH significant for ESRD on HD MWF, CAD, HTN, T2DM, hx multiple falls, subdural hematoma, nonsustained v-tach s/p pacemaker.  Vas-cath failure, ESRD on HD MWF No signs of uremia, volume overload.K+ continues to trend upward to 5.6 this morning, will give Lokelma x1. Phos elevated 9.1.  BUN trending upward at 72, creatinine 13.27.  Consulted with nephrology who will follow along, and provide care after IR intervention for obstructed/malfunctioning Vas-Cath. - Nephrology consulted, appreciate reccs - IR consulted, appreciate reccs - Continue home medications - Renal diet - CMP, CBC  Nausea, vomiting, generalized weakness in setting of sepsis criteria Patient is afebrile without tachycardia, tachypnea, but we have a  high suspicion for possible bacteremia given indwelling catheter, lactic acidosis with left shift and history of  MRSE + bacteremia with TX with Vancomycin.  Leukocytosis with WBC 15.4 this morning. qSOFA score of 0. Mentating appropriately this morning.   No concern for pneumonia or UTI at this time as he is not having any shortness of breath, cough, respiratory distress, dysuria, hematuria.  Repeat lactate trending downward from 6.6 on admission to 4.7 this morning. Will continue to trend lactate and start Vancomycin. - IV Vancomycin, dosing per pharmacy -Trending lactate - Penidng blood cultures - Pending urine cultures - Daily CBC, monitor for fever  Elevated LFTs Had  history of elevated transaminases in 03/2021.  continues to increase to AST/ALT1622/890. No concern for shock liver.  No abdominal tenderness to palpation, negative Murphy sign.  Potential etiologies include hepatitis, pending hepatitis panel.  Also considering a drug-induced liver injury, rhabdomyolysis which or not likely as CK within normal limits at 225, alcohol < 10, acetaminophen <10.  Right upper quadrant ultrasound not concerning for any gallbladder pathology and showed hepatic steatosis.  We will hold atorvastatin in light of elevated LFTs.  - F/U acetaminophen, EtOH, CK labs - Pending hepatitis panel - Trend LFTs, next CMP this afternoon  T2DM, diet controlled Last A1c 5.5% glucose 150 on admission. -Monitor glucose with CMP.  ASA for CAD, history NSVT s/p pacemaker Patient in normal sinus rhythm this morning. -Continuous cardiac monitoring -Continue ASA 81 mg -Continue home Lopressor 12.5 mg twice daily  Hyperlipidemia, CAD Chronic - Holding home atorvastatin -Continue home aspirin  FEN/GI: Renal diet, IVF PPx: Heparin Dispo:Pending PT recommendations, clinical improvement. Requiring inpatient admission due to severity of illness, IVF, workup.  Subjective:  William [name] has no complaints this morning, he denies nausea, vomiting, abdominal pain, chest pain, shortness of breath, cough.  He is alert and oriented x4, and is aware of reasoning for hospitalization.   Objective: Temp:  [98 F (36.7 C)-98.1 F (36.7 C)] 98.1 F (36.7 C) (10/27 2148) Pulse Rate:  [67-114] 88 (10/28 0345) Resp:  [16-20] 17 (10/28 0345) BP: (110-152)/(76-98) 141/92 (10/28 0345) SpO2:  [98 %-100 %] 100 % (10/28 0345) Physical Exam: General: Elderly male in no acute distress, resting in bed comfortably Cardiovascular: Normal rate and regular rhythm.  No JVD, no lower extremity edema, capillary refill less than 3 seconds Respiratory: Faint crackles  heard in right base, lungs clear in other fields.   Regular respiratory rate.  Normal SPO2 on room air. Abdomen: Soft, nontender, nondistended.  Normal active bowel sounds. Extremities: Warm, dry, well perfused.  No edema  Laboratory: Recent Labs  Lab 08/06/21 1258 08/04/2021 1645 08/08/21 0526  WBC 6.0 19.6* 15.4*  HGB 9.9* 10.5* 9.6*  HCT 32.2* 33.1* 28.9*  PLT 173 190 162   Recent Labs  Lab 08/06/21 1258 07/26/2021 1645 08/08/21 0526  NA 137 137 PENDING  K 5.2* 5.4* 5.6*  CL 96* 93* PENDING  CO2 19* 15* PENDING  BUN 39* 57* 72*  CREATININE 9.67* 12.04* 13.27*  CALCIUM 8.9 9.2 9.0  PROT  --  7.1 6.7  BILITOT  --  1.4* 0.9  ALKPHOS  --  100 93  ALT  --  310* 890*  AST  --  522* 1,622*  GLUCOSE 201* 150* 124*    Imaging/Diagnostic Tests: CT Abdomen Pelvis Wo Contrast  Result Date: 07/20/2021 CLINICAL DATA:  Nausea, vomiting, generalized weakness EXAM: CT ABDOMEN AND PELVIS WITHOUT CONTRAST TECHNIQUE: Multidetector CT imaging of the abdomen and pelvis was performed following the standard protocol without IV contrast. COMPARISON:  03/28/2021 FINDINGS: Lower chest: No acute abnormality. Pacer wires and dialysis catheter noted in the right heart. Aortic atherosclerosis. Diffuse coronary artery and aortic atherosclerosis. Hepatobiliary: No focal hepatic abnormality. Gallbladder unremarkable. Pancreas: No focal abnormality or ductal dilatation. Spleen: No focal abnormality.  Normal size. Adrenals/Urinary Tract: No renal or adrenal mass. No hydronephrosis. Urinary bladder decompressed. Stomach/Bowel: Scattered colonic diverticulosis. No active diverticulitis. Stomach and small bowel decompressed, unremarkable. Vascular/Lymphatic: Aortic atherosclerosis. No evidence of aneurysm or adenopathy. Reproductive: No visible focal abnormality. Other: Small amount of free fluid in the abdomen and pelvis. Dialysis catheter in place from the right groin into the right atrium. Musculoskeletal: No acute bony abnormality. IMPRESSION: No acute findings in  the abdomen or pelvis. Scattered colonic diverticulosis. Small amount of free fluid in the abdomen and pelvis. Aortic atherosclerosis, coronary artery disease. Electronically Signed   By: Rolm Baptise M.D.   On: 08/08/2021 18:35   DG Chest 2 View  Result Date: 07/23/2021 CLINICAL DATA:  Shortness of breath EXAM: CHEST - 2 VIEW COMPARISON:  03/28/2021 FINDINGS: Left pacer remains in place, unchanged. Heart and mediastinal contours are within normal limits. No focal opacities or effusions. No acute bony abnormality. Aortic atherosclerosis. IMPRESSION: No active cardiopulmonary disease. Electronically Signed   By: Rolm Baptise M.D.   On: 07/12/2021 17:17     Orvis Brill, DO 08/08/2021, 7:07 AM PGY-1, [place] Intern pager: [phone], text pages welcome
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
## 2021-08-08 NOTE — ED Notes (Signed)
Patient transported to IR 

## 2021-08-08 NOTE — Procedures (Signed)
Interventional Radiology Procedure Note  Procedure:  Inferior venacavogram HD catheter exchange IVC venoplasty  Indication: Non functioning Tunneled HD catheter  Findings: Please refer to procedural dictation for full description.  Complications: None  EBL: < 10 mL  Miachel Roux, MD [phone]

## 2021-08-08 NOTE — Hospital Course (Addendum)
William [name] is an 80 y.o. male who presented with vascath failure in setting of sepsis and elevated LFTs. PMH is significant for ESRD on HD MWF, CAD s/p PCI, HTN, T2DM, hx multiple falls, subdural hematoma, nonsustained v-tach s/p pacemaker, and sarcoidosis.  Vas-cath failure, ESRD on MWF HD  It appears patient had failure a RUE AVF and subsequently had a femoral TDC placed. Patient had problems with the [facility] since June of 2022, at which time he was admitted for catheter exchange but was found to be septic. He was treated accordingly and improved. Catheter was exchanged by IR and he was able to restart dialysis. At some point his [facility] was changed to a vascath.  Patient reports that he was unable to receive dialysis on Wednesday (10/26) due to malfunctioning of the femoral vein vas-cath. IR was consulted and they successfully placed a new vas-cath and performed an IVC venoplasty. After this, the patient was able to receive dialysis as scheduled with no complications***.   Elevated LFTs, coagulopathy The patient presented with elevated LFTs that reached a peak of AST/ALT of 1,400/1,000 with an INR of 1.6. His LFTs and INR downtrended to*** respectively. Hepatitis panel was negative and CT contrasted liver study was unremarkable. It was uncertain as to what the etiology of his his elevated transaminases and coagulopathy was.   Acute ABD pain Patient developed severe ABD pain on 10/29. An extensive workup was done, including CT, CTA, and KUB. Patient was given soap suds enema and lactulose which produced a bowel movement***  Sepsis Ruled Out Patient presented with lactate of 6.6, leukocytosis of 19.6, and borderline tachycardia. Given that patient has an indwelling catheter and a recent Hx of bacteremia, he was treated briefly with vancomycin.   T2DM CBGs consistently in the 100's. Patient has no home diabetic regimen. He was kept on regular CBG checks.    CAD, NSVT s/p pacemaker Currently  asymptomatic. Continued home metoprolol 12.5 mg twice daily  Hyperlipidemia Held statin due to liver disease.   Incidental finding on CT contrasted liver study "Small cyst along the tail the pancreas without worrisome imaging characteristics. Consider follow-up imaging in 2 years."

## 2021-08-08 NOTE — Plan of Care (Signed)
  Problem: Health Behavior/Discharge Planning: Goal: Ability to manage health-related needs will improve Outcome: Progressing   Problem: Education: Goal: Knowledge of General Education information will improve Description: Including pain rating scale, medication(s)/side effects and non-pharmacologic comfort measures Outcome: Progressing   

## 2021-08-08 NOTE — Progress Notes (Signed)
FPTS Brief Progress Note  S:Pt denies abdominal pain, alcohol use, Tylenol use. States his grandchild was sick 2-3 weeks ago but has gotten better. No nausea or vomiting. States he just feels different. No chills.    O: BP (!) 180/107 (BP Location: Left Arm)   Pulse 82   Temp 97.9 F (36.6 C) (Oral)   Resp 18   Ht 5\' 7"  (1.702 m)   SpO2 97%   BMI 33.15 kg/m    GEN: non toxic appearing, pleasant elderly male in no acute distress RESP: no increased work of breathing   A/P: Acute Liver Failure LFTs up trending. Will obtain CT Liver to better evaluate pt's transaminitis. Pt updated.   ESRD Due for HD when able. Unable to reach HD staff to see if pt is on the schedule for tonight.   - Orders reviewed. Labs for AM ordered, which was adjusted as needed.    Lyndee Hensen, DO 08/08/2021, 11:11 PM PGY-3, [facility] Night Resident  Please page [phone] with questions.
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
## 2021-08-09 ENCOUNTER — Inpatient Hospital Stay (HOSPITAL_COMMUNITY): Payer: Medicare PPO

## 2021-08-09 DIAGNOSIS — R748 Abnormal levels of other serum enzymes: Secondary | ICD-10-CM | POA: Diagnosis not present

## 2021-08-09 DIAGNOSIS — R4182 Altered mental status, unspecified: Secondary | ICD-10-CM | POA: Diagnosis not present

## 2021-08-09 LAB — BLOOD GAS, VENOUS
Acid-Base Excess: 0.5 mmol/L (ref 0.0–2.0)
Bicarbonate: 24.1 mmol/L (ref 20.0–28.0)
Drawn by: 164
FIO2: 21
O2 Saturation: 86.7 %
Patient temperature: 37
pCO2, Ven: 35.1 mmHg — ABNORMAL LOW (ref 44.0–60.0)
pH, Ven: 7.451 — ABNORMAL HIGH (ref 7.250–7.430)
pO2, Ven: 57.4 mmHg — ABNORMAL HIGH (ref 32.0–45.0)

## 2021-08-09 LAB — PROTIME-INR
INR: 1.6 — ABNORMAL HIGH (ref 0.8–1.2)
Prothrombin Time: 19 seconds — ABNORMAL HIGH (ref 11.4–15.2)

## 2021-08-09 LAB — COMPREHENSIVE METABOLIC PANEL
ALT: 903 U/L — ABNORMAL HIGH (ref 0–44)
AST: 1082 U/L — ABNORMAL HIGH (ref 15–41)
Albumin: 3.1 g/dL — ABNORMAL LOW (ref 3.5–5.0)
Alkaline Phosphatase: 101 U/L (ref 38–126)
Anion gap: 22 — ABNORMAL HIGH (ref 5–15)
BUN: 86 mg/dL — ABNORMAL HIGH (ref 8–23)
CO2: 19 mmol/L — ABNORMAL LOW (ref 22–32)
Calcium: 8.6 mg/dL — ABNORMAL LOW (ref 8.9–10.3)
Chloride: 93 mmol/L — ABNORMAL LOW (ref 98–111)
Creatinine, Ser: 13.34 mg/dL — ABNORMAL HIGH (ref 0.61–1.24)
GFR, Estimated: 3 mL/min — ABNORMAL LOW (ref >60)
Glucose, Bld: 100 mg/dL — ABNORMAL HIGH (ref 70–99)
Potassium: 5 mmol/L (ref 3.5–5.1)
Sodium: 134 mmol/L — ABNORMAL LOW (ref 135–145)
Total Bilirubin: 1.4 mg/dL — ABNORMAL HIGH (ref 0.3–1.2)
Total Protein: 6 g/dL — ABNORMAL LOW (ref 6.5–8.1)

## 2021-08-09 LAB — GLUCOSE, CAPILLARY
Glucose-Capillary: 54 mg/dL — ABNORMAL LOW (ref 70–99)
Glucose-Capillary: 55 mg/dL — ABNORMAL LOW (ref 70–99)
Glucose-Capillary: 175 mg/dL — ABNORMAL HIGH (ref 70–99)
Glucose-Capillary: 91 mg/dL (ref 70–99)
Glucose-Capillary: 111 mg/dL — ABNORMAL HIGH (ref 70–99)
Glucose-Capillary: 196 mg/dL — ABNORMAL HIGH (ref 70–99)
Glucose-Capillary: 143 mg/dL — ABNORMAL HIGH (ref 70–99)

## 2021-08-09 LAB — CBC
HCT: 28.9 % — ABNORMAL LOW (ref 39.0–52.0)
Hemoglobin: 9.6 g/dL — ABNORMAL LOW (ref 13.0–17.0)
MCH: 31.3 pg (ref 26.0–34.0)
MCHC: 33.2 g/dL (ref 30.0–36.0)
MCV: 94.1 fL (ref 80.0–100.0)
Platelets: 156 10*3/uL (ref 150–400)
RBC: 3.07 MIL/uL — ABNORMAL LOW (ref 4.22–5.81)
RDW: 16.5 % — ABNORMAL HIGH (ref 11.5–15.5)
WBC: 13.2 10*3/uL — ABNORMAL HIGH (ref 4.0–10.5)
nRBC: 0.6 % — ABNORMAL HIGH (ref 0.0–0.2)

## 2021-08-09 LAB — VOLATILES,BLD-ACETONE,ETHANOL,ISOPROP,METHANOL
Acetone, blood: <0.01 g/dL (ref 0.000–0.010)
Ethanol, blood: <0.01 g/dL (ref 0.000–0.010)
Isopropanol, blood: <0.01 g/dL (ref 0.000–0.010)
Methanol, blood: <0.01 g/dL (ref 0.000–0.010)

## 2021-08-09 LAB — SALICYLATE LEVEL: Salicylate Lvl: <7 mg/dL — ABNORMAL LOW (ref 7.0–30.0)

## 2021-08-09 MED ORDER — HEPARIN SODIUM (PORCINE) 1000 UNIT/ML IJ SOLN
INTRAMUSCULAR | Status: AC
  Filled 2021-08-09: qty 6

## 2021-08-09 MED ORDER — IOHEXOL 350 MG/ML SOLN
75.0000 mL | Freq: Once | INTRAVENOUS | Status: AC | PRN
  Administered 2021-08-09: 75 mL via INTRAVENOUS

## 2021-08-09 MED ORDER — DEXTROSE 50 % IV SOLN
1.0000 | INTRAVENOUS | Status: DC | PRN
  Administered 2021-08-09: 50 mL via INTRAVENOUS
  Administered 2021-08-13: 25 mL via INTRAVENOUS
  Administered 2021-08-14: 50 mL via INTRAVENOUS
  Filled 2021-08-09 (×3): qty 50

## 2021-08-09 MED ORDER — LIDOCAINE 5 % EX PTCH
1.0000 | MEDICATED_PATCH | CUTANEOUS | Status: DC
  Administered 2021-08-09 – 2021-08-13 (×5): 1 via TRANSDERMAL
  Filled 2021-08-09 (×5): qty 1

## 2021-08-09 MED ORDER — NEPRO/CARBSTEADY PO LIQD
237.0000 mL | Freq: Two times a day (BID) | ORAL | Status: DC
  Administered 2021-08-09 – 2021-08-12 (×7): 237 mL via ORAL

## 2021-08-09 MED ORDER — VANCOMYCIN HCL IN DEXTROSE 750-5 MG/150ML-% IV SOLN: 750.0000 mg | INTRAVENOUS | Status: DC

## 2021-08-09 MED ORDER — VANCOMYCIN HCL IN DEXTROSE 1-5 GM/200ML-% IV SOLN
1000.0000 mg | Freq: Once | INTRAVENOUS | Status: AC
  Administered 2021-08-09: 1000 mg via INTRAVENOUS
  Filled 2021-08-09: qty 200

## 2021-08-09 MED ORDER — METOCLOPRAMIDE HCL 10 MG PO TABS
5.0000 mg | ORAL_TABLET | Freq: Once | ORAL | Status: AC | PRN
  Administered 2021-08-09: 5 mg via ORAL
  Filled 2021-08-09: qty 1

## 2021-08-09 NOTE — Progress Notes (Signed)
FPTS Interim Progress Note  S: Went to evaluate patient at the start of shift.  Patient was resting comfortably in his bed.  Speaking softly but in complete sentences.  He had no complaints other than he was hopeful that he would get some rest tonight.  No signs of confusion or somnolence on physical exam compared to documentation earlier so this may have been due to his HD.  O: BP (!) 162/91 (BP Location: Right Arm)   Pulse 85   Temp 98.7 F (37.1 C) (Axillary)   Resp 17   Ht 5\' 7"  (1.702 m)   Wt 72.5 kg   SpO2 97%   BMI 25.03 kg/m   General: Frail appearing elderly male lying in bed in no acute distress, alert Heart: Regular rate and rhythm with no murmurs appreciated Lungs: CTA bilaterally, no wheezing Skin: Warm and dry   A/P: 80 year old male admitted due to a complication with patient's dialysis catheter as well as acute elevation of LFTs.  His LFTs are now downtrending.  There was documentation that he appeared somnolent earlier today however at this time he is awake and speaking in full sentences so this may have been due to his HD session.  He has no concerns at this time and is hopeful he will be able to get some rest tonight.  Per nephro is noted.  Next HD session will be on Monday.  Remainder per daytime progress note  Lurline Del, DO 08/09/2021, 8:19 PM PGY-3, [facility] Service pager [phone]

## 2021-08-09 NOTE — Progress Notes (Signed)
Pharmacy Antibiotic Note  William [name] is a 80 y.o. male admitted on 07/20/2021 with Vas-Cath failure.  Pharmacy has been consulted for vancomycin dosing for possible sepsis with suspicion for bacteremia. Patient has a history of MRSE bacteremia in June of this year. Patient is ESRD on HD MWF. Patient has elevated WBC count but remains afebrile. Blood and urine cultures are pending.   He had HD x4hr early this morning and received 1000 mg after. Weight changed in Epic to 72.5 kg so will update vancomycin dose.  Plan: -Vancomycin 750 mg IV after HD on MWF - next Mon -Follow-up on HD schedule -Target pre-HD level of 15-25 mcg/mL.  -Will check vancomycin levels as clinically indicated -Follow-up LOT  Height: 5\' 7"  (170.2 cm) Weight: 72.5 kg (159 lb 13.3 oz) IBW/kg (Calculated) : 66.1  Temp (24hrs), Avg:98.2 F (36.8 C), Min:97.7 F (36.5 C), Max:98.6 F (37 C)  Recent Labs  Lab 08/06/21 1258 07/12/2021 1645 08/08/21 0343 08/08/21 0526 08/08/21 0651 08/08/21 1212 08/08/21 1823 08/08/21 1844 08/09/21 0043 08/09/21 0215  WBC 6.0 19.6*  --  15.4*  --   --  12.2*  --   --  13.2*  CREATININE 9.67* 12.04*  --  13.27*  --   --  12.89* 13.20* 13.34*  --   LATICACIDVEN  --   --  6.6*  --  4.7* 2.8*  --   --   --   --      Estimated Creatinine Clearance: 4.1 mL/min (A) (by C-G formula based on SCr of 13.34 mg/dL (H)).    Allergies  Allergen Reactions   Carvedilol Other (See Comments)    Makes him feel like his pelvis is "grinding" (??) Patient doesn't recall this, however   Antimicrobials this admission: Vancomycin 10/28 >>  Microbiology results: 10/28 BCx: ngtd 10/28 UCx: pending  Thank you for involving pharmacy in this patient's care.  Renold Genta, PharmD, BCPS Clinical Pharmacist Clinical phone for 08/09/2021 until 3p is [phone] 08/09/2021 1:29 PM  **Pharmacist phone directory can be found on [email or web address] listed under [place]**

## 2021-08-09 NOTE — Progress Notes (Signed)
Post dialysis cbg 54, pt is alert, verbally responsive, and  able  to swallow. 120 cc of apple juice/prune given at dialysis unit,  pt transferred to his room.  Report given to the floor  nurse and MD on call notified.

## 2021-08-09 NOTE — Progress Notes (Addendum)
Subjective: Seen in room and complaining of hiccups, finished dialysis early this a.m. 1 L UF tolerated.  This a.m. minimal voice response , says he feels terrible, denies shortness of breath or chest pain and had dialysis finishing this a.m. from later last night  Objective Vital signs in last 24 hours: Vitals:   08/09/21 0536 08/09/21 0606 08/09/21 0636 08/09/21 0755  BP: (!) 150/89 (!) 155/90 (!) 144/89 (!) 151/81  Pulse: 85 89 94 89  Resp: 16 14 18 17   Temp:   98.6 F (37 C) 98.3 F (36.8 C)  TempSrc:   Oral Oral  SpO2: 99% 99% 98% 98%  Weight:      Height:       Weight change:   Physical Exam: General: Soft-spoken slightly lethargic but NAD elderly thin male Lungs: CTA nonlabored Heart: RRR no MRG Abdomen: NABS soft, non-tender, non-distended  Lower extremities: No pedal edema minimal thigh edema bilaterally  dialysis Access: R thigh TDC dressing dry and clear   Dialysis Orders: Center: [place]  on MWF. 180NRe, 4 hours, BFR 400, DFR Auto 1.5, EDW 68.5kg, 2K, 2 Ca, TDC Heparin 4000 unit bolus Venofer 100mg  IV q HD 10/19-11/9 Calcitriol 2.38mcg PO q HD   Problem/Plan:  TDC malfunction: Dialysis center unable to access. IR consulted. AND  IR 10/28=HD catheter exchange IVC venoplasty  ESRD:  MWF schedule, last dialysis was Monday due to nausea and catheter malfunction.  TDC procedure as above /not volume overloaded on exam.  Nausea/elevated LFTs: LFTs signficantly elevated, RUQ US unremarkable. Statin on hold. nausea is may be partially due to uremia but patient developed nausea prior to missing any dialysis.Management per primary team.  Hypertension/volume: BP moderately elevated on admit, dialysis improved dialysis, on metoprolol.    Anemia: Of ESRD Hgb at goal, on admit this a.m. 9.6 not on ESA. Receiving iron outpatient but will hold given acute infection.  Monitor Hgb trend if still low give ESA next treatment Monday  Metabolic bone disease: Calcium controlled. Phos 8.0.  Continue renvela once tolerating PO.   William Haber, PA-C [facility] Beeper [phone] 08/09/2021,10:16 AM  LOS: 1 day   Labs: Basic Metabolic Panel: Recent Labs  Lab 08/08/21 0526 08/08/21 1823 08/08/21 1844 08/09/21 0043  NA 136 135 137 134*  K 5.6* 5.4* 5.5* 5.0  CL 92* 93* 94* 93*  CO2 21* 20* 18* 19*  GLUCOSE 124* 67* 65* 100*  BUN 72* 77* 82* 86*  CREATININE 13.27* 12.89* 13.20* 13.34*  CALCIUM 9.0 8.7* 8.8* 8.6*  PHOS 9.1* 8.0*  --   --    Liver Function Tests: Recent Labs  Lab 08/08/21 0526 08/08/21 1823 08/08/21 1844 08/09/21 0043  AST 1,622*  --  1,463* 1,082*  ALT 890*  --  1,029* 903*  ALKPHOS 93  --  103 101  BILITOT 0.9  --  1.7* 1.4*  PROT 6.7  --  6.8 6.0*  ALBUMIN 3.4* 3.3* 3.4* 3.1*   Recent Labs  Lab 08/06/2021 1645  LIPASE 47   No results for input(s): AMMONIA in the last 168 hours. CBC: Recent Labs  Lab 08/06/21 1258 08/02/2021 1645 08/08/21 0526 08/08/21 1823 08/09/21 0215  WBC 6.0 19.6* 15.4* 12.2* 13.2*  NEUTROABS 4.5 16.4*  --   --   --   HGB 9.9* 10.5* 9.6* 9.7* 9.6*  HCT 32.2* 33.1* 28.9* 29.9* 28.9*  MCV 99.1 96.5 93.5 94.9 94.1  PLT 173 190 162 161 156   Cardiac Enzymes: Recent Labs  Lab  08/08/21 0827  CKTOTAL 225   CBG: Recent Labs  Lab 08/08/21 2128 08/08/21 2155 08/09/21 0646 08/09/21 0722 08/09/21 0751  GLUCAP 57* 91 54* 55* 175*    Medications:   aspirin  81 mg Oral Daily   Chlorhexidine Gluconate Cloth  6 each Topical Q0600   heparin  5,000 Units Subcutaneous Q8H   heparin sodium (porcine)       metoprolol tartrate  12.5 mg Oral BID   sevelamer carbonate  800 mg Oral TID WC   vancomycin variable dose per unstable renal function (pharmacist dosing)   Does not apply See admin instructions    I have seen and examined this patient and agree with plan and assessment in the above note with renal recommendations/intervention highlighted.  Continue with HD on MWF schedule.  Broadus John A  Milo Schreier,MD 08/09/2021 11:26 AM

## 2021-08-09 NOTE — Progress Notes (Addendum)
Family Medicine Teaching Service Daily Progress Note Intern Pager: [phone]  Patient name: William [name] Medical record number: [id number] Date of birth: 12/13/1940 Age: 80 y.o. Gender: male  Primary Care Provider: Rise Patience, DO Consultants: IR, nephro Code Status: Full  Pt Overview and Major Events to Date:  10/28: admitted, met sepsis criteria 10/28: inferior venacavogram, HD catheter exchange, IVC venoplasty  Assessment and Plan:  William [name] is an 80 y.o. male who presented with vascath failure in setting of sepsis and elevated LFTs. PMH is significant for ESRD on HD MWF, CAD, HTN, T2DM, hx multiple falls, subdural hematoma, nonsustained v-tach s/p pacemaker.   Vas-Cath failure, ESRD on HD MWF Pt is very soft spoken this morning, responding to questions by shaking his head yes or no. This may be due to possible uremia, although pt is not experiencing N/V/D. Hemodialysis performed this morning which yielded 1L. Prior to dialysis, BUN trending up to 86, Cr 13.34 and K 5.0. IR performed an inferior venacavogram, HD catheter exchange, and IVC venoplasty on 10/28 for the obstructed/malfunctioning vas-cath.  -Nephrology following, appreciate their care -IR following, appreciate their care -Continue home medications -Recheck CMP this afternoon -Renal diet  Nausea, vomiting, generalized weakness in setting of sepsis criteria This morning pt is still afebrile without tachycardia, tachypnea. Pt was started on IV vancomycin 10/28 due to high suspicion of bacteremia from indwelling catheter, lactic acidosis w left shift and hx of MRSE. Lactic acid continuing to improve from 6.6 to 2.8. WBC of 13.2, down from 15.4 on admission. No concern for pneumonia at this time as he is not having any SOB, cough, resp distress, dysuria, or hematuria. Plan to d/c IV vanc if no growth in 48 hr blood cultures.  -IV Vanc, dosing per pharmacy -blood cultures: no growth in 24 hrs -pending urine  cultures -daily CBC  Elevated LFTs  Acute Liver Failure LFTs have begun to decrease. AST 1,082 (1,622 on admission) and ALT 903 (1,029 yesterday afternoon). Hepatitis B panel reveals no acute infection as antigen was negative. Hep B surface antibody was positive, likely due to Hep B vaccination. Tox screen indicates low liklihood of drug induced liver injury as all toxins were WNL (acetaminophen, EtOH, isopropanol, methanol, and acetone).  INR elevated at 1.6, further suggesting liver injury. High on differential is possible hepatic stenosis, such as Budd Chiari syndrome due to previous indwelling cather. LFTs have started to decrease since IR's procedure on 10/28. No abdominal tenderness to palpation, bowel sounds present.  -recheck LFTs in PM -CT liver/abdomen ordered  -holding statin  T2DM, diet controlled Overnight, pt had a CBG of 54 and was administered 37ml of D50. D50 ordered PRN for low blood sugar.  -CBG checks prior to meals and nightly -D50 PRN for low blood glucose levels -Encourage PO intake  CAD, hx NSVT s/p pacemaker Patient in normal sinus rhythm this morning -ASA 81 mg -Continue home lopressor 12.5 mg twice daily  Hyperlipidemia, CAD Chronic, stable -holding home atorvastatin d/t elevated LFTs -continue hope aspirin   FEN/GI: Renal diet, IVF PPx: Heparin Dispo:Pending PT recommendations, clinical course/improvement.   Subjective:  Pt seen at bedside this morning not talking. Pt would answer yes/no questions by shaking his head but he would not speak. He shook his head no when asked if he had any abdominal pain, trouble breathing, or chest pain.   Objective: Temp:  [97.7 F (36.5 C)-98.6 F (37 C)] 98.6 F (37 C) (10/29 0636) Pulse Rate:  [73-100] 94 (10/29 0636) Resp:  [  14-23] 18 (10/29 0636) BP: (128-183)/(82-131) 144/89 (10/29 0636) SpO2:  [96 %-100 %] 98 % (10/29 0636) Weight:  [72.5 kg] 72.5 kg (10/29 0206) Physical Exam: General: Elderly male in no  acute distress, resting in bed comfortably, slightly altered, answering questions by shaking head yes or no Cardiovascular: Normal rate and regular rhythm, No JVD, no lower extremity edema, capillary refill less than 3 seconds Respiratory: CTAB, regular respiratory rate. Normal SPO2 on room air.  Abdomen: Soft, nontender, nondistended. Normal active bowel sounds Extremities: Warm, dry, well perfused. No edema  Laboratory: Recent Labs  Lab 08/08/21 0526 08/08/21 1823 08/09/21 0215  WBC 15.4* 12.2* 13.2*  HGB 9.6* 9.7* 9.6*  HCT 28.9* 29.9* 28.9*  PLT 162 161 156   Recent Labs  Lab 08/08/21 0526 08/08/21 1823 08/08/21 1844 08/09/21 0043  NA 136 135 137 134*  K 5.6* 5.4* 5.5* 5.0  CL 92* 93* 94* 93*  CO2 21* 20* 18* 19*  BUN 72* 77* 82* 86*  CREATININE 13.27* 12.89* 13.20* 13.34*  CALCIUM 9.0 8.7* 8.8* 8.6*  PROT 6.7  --  6.8 6.0*  BILITOT 0.9  --  1.7* 1.4*  ALKPHOS 93  --  103 101  ALT 890*  --  1,029* 903*  AST 1,622*  --  1,463* 1,082*  GLUCOSE 124* 67* 65* 100*      Angus Seller, Medical Student 08/09/2021, 7:37 AM MS-4, [place] Intern pager: [phone], text pages welcome   I was personally present and re-performed the exam and medical decision making and verified the service and findings are accurately documented in the student's note.  [name], MD 08/09/2021 12:37 PM

## 2021-08-10 ENCOUNTER — Inpatient Hospital Stay (HOSPITAL_COMMUNITY): Payer: Medicare PPO

## 2021-08-10 DIAGNOSIS — R4182 Altered mental status, unspecified: Secondary | ICD-10-CM | POA: Diagnosis not present

## 2021-08-10 LAB — COMPREHENSIVE METABOLIC PANEL
ALT: 876 U/L — ABNORMAL HIGH (ref 0–44)
ALT: 831 U/L — ABNORMAL HIGH (ref 0–44)
AST: 778 U/L — ABNORMAL HIGH (ref 15–41)
AST: 558 U/L — ABNORMAL HIGH (ref 15–41)
Albumin: 2.8 g/dL — ABNORMAL LOW (ref 3.5–5.0)
Albumin: 2.9 g/dL — ABNORMAL LOW (ref 3.5–5.0)
Alkaline Phosphatase: 109 U/L (ref 38–126)
Alkaline Phosphatase: 123 U/L (ref 38–126)
Anion gap: 16 — ABNORMAL HIGH (ref 5–15)
Anion gap: 13 (ref 5–15)
BUN: 31 mg/dL — ABNORMAL HIGH (ref 8–23)
BUN: 37 mg/dL — ABNORMAL HIGH (ref 8–23)
CO2: 19 mmol/L — ABNORMAL LOW (ref 22–32)
CO2: 24 mmol/L (ref 22–32)
Calcium: 9.1 mg/dL (ref 8.9–10.3)
Calcium: 9 mg/dL (ref 8.9–10.3)
Chloride: 96 mmol/L — ABNORMAL LOW (ref 98–111)
Chloride: 93 mmol/L — ABNORMAL LOW (ref 98–111)
Creatinine, Ser: 6.74 mg/dL — ABNORMAL HIGH (ref 0.61–1.24)
Creatinine, Ser: 7.88 mg/dL — ABNORMAL HIGH (ref 0.61–1.24)
GFR, Estimated: 8 mL/min — ABNORMAL LOW (ref >60)
GFR, Estimated: 6 mL/min — ABNORMAL LOW (ref >60)
Glucose, Bld: 96 mg/dL (ref 70–99)
Glucose, Bld: 148 mg/dL — ABNORMAL HIGH (ref 70–99)
Potassium: 4.1 mmol/L (ref 3.5–5.1)
Potassium: 3.7 mmol/L (ref 3.5–5.1)
Sodium: 131 mmol/L — ABNORMAL LOW (ref 135–145)
Sodium: 130 mmol/L — ABNORMAL LOW (ref 135–145)
Total Bilirubin: 1.1 mg/dL (ref 0.3–1.2)
Total Bilirubin: 0.8 mg/dL (ref 0.3–1.2)
Total Protein: 5.9 g/dL — ABNORMAL LOW (ref 6.5–8.1)
Total Protein: 6.4 g/dL — ABNORMAL LOW (ref 6.5–8.1)

## 2021-08-10 LAB — CBC
HCT: 26.8 % — ABNORMAL LOW (ref 39.0–52.0)
Hemoglobin: 9.2 g/dL — ABNORMAL LOW (ref 13.0–17.0)
MCH: 31.4 pg (ref 26.0–34.0)
MCHC: 34.3 g/dL (ref 30.0–36.0)
MCV: 91.5 fL (ref 80.0–100.0)
Platelets: 159 10*3/uL (ref 150–400)
RBC: 2.93 MIL/uL — ABNORMAL LOW (ref 4.22–5.81)
RDW: 16.4 % — ABNORMAL HIGH (ref 11.5–15.5)
WBC: 12.8 10*3/uL — ABNORMAL HIGH (ref 4.0–10.5)
nRBC: 2.7 % — ABNORMAL HIGH (ref 0.0–0.2)

## 2021-08-10 LAB — GLUCOSE, CAPILLARY
Glucose-Capillary: 134 mg/dL — ABNORMAL HIGH (ref 70–99)
Glucose-Capillary: 139 mg/dL — ABNORMAL HIGH (ref 70–99)
Glucose-Capillary: 140 mg/dL — ABNORMAL HIGH (ref 70–99)
Glucose-Capillary: 160 mg/dL — ABNORMAL HIGH (ref 70–99)
Glucose-Capillary: 174 mg/dL — ABNORMAL HIGH (ref 70–99)

## 2021-08-10 LAB — HEPATITIS B SURFACE ANTIBODY, QUANTITATIVE: Hep B S AB Quant (Post): 99.1 m[IU]/mL (ref >9.9)

## 2021-08-10 MED ORDER — DARBEPOETIN ALFA 25 MCG/0.42ML IJ SOSY
25.0000 ug | PREFILLED_SYRINGE | INTRAMUSCULAR | Status: DC
  Administered 2021-08-11: 25 ug via INTRAVENOUS
  Filled 2021-08-10: qty 0.42

## 2021-08-10 MED ORDER — SENNA 8.6 MG PO TABS
1.0000 | ORAL_TABLET | Freq: Two times a day (BID) | ORAL | Status: DC
  Administered 2021-08-10 – 2021-08-12 (×6): 8.6 mg via ORAL
  Filled 2021-08-10 (×6): qty 1

## 2021-08-10 MED ORDER — POLYETHYLENE GLYCOL 3350 17 G PO PACK
17.0000 g | PACK | Freq: Two times a day (BID) | ORAL | Status: DC
  Administered 2021-08-10 – 2021-08-12 (×6): 17 g via ORAL
  Filled 2021-08-10 (×6): qty 1

## 2021-08-10 MED ORDER — METOCLOPRAMIDE HCL 10 MG PO TABS
5.0000 mg | ORAL_TABLET | Freq: Once | ORAL | Status: AC | PRN
  Administered 2021-08-10: 5 mg via ORAL
  Filled 2021-08-10: qty 1

## 2021-08-10 MED ORDER — OXYCODONE HCL 5 MG PO TABS
5.0000 mg | ORAL_TABLET | Freq: Once | ORAL | Status: AC
Start: 2021-08-10 — End: 2021-08-10
  Administered 2021-08-10: 5 mg via ORAL
  Filled 2021-08-10: qty 1

## 2021-08-10 NOTE — Progress Notes (Signed)
Went to evaluate patient after nurse noted he was complaining of hiccups. By the time I arrived he was sleeping comfortably and the hiccups seemed to have subsided while I was in the room. He was in no distress and I did not wake him. Will place single PRN dose of reglan for hiccups if needed later this morning as it appears he received this for hiccups yesterday.

## 2021-08-10 NOTE — Progress Notes (Signed)
Family Medicine Teaching Service Daily Progress Note Intern Pager: [phone]  Patient name: William [name] Medical record number: [id number] Date of birth: Mar 06, 1941 Age: 80 y.o. Gender: male  Primary Care Provider: Rise Patience, DO Consultants: Nephrology, IR Code Status: Full  Pt Overview and Major Events to Date:  10/28: Admit to [facility] 10/28: Right Fem HD cath exchanged  Assessment and Plan: William [name] is an 80 y.o. male who presented with vascath failure in setting of sepsis and elevated LFTs. PMH is significant for ESRD on HD MWF, CAD, HTN, T2DM, hx multiple falls, subdural hematoma, nonsustained v-tach s/p pacemaker  Abdominal Pain likely secondary to constipation Acute. Recent CT abd negative for acute abdomen.  Less likely infectious etiology given no fevers, downward trend of WBC's and no bloody stool. Low suspicion for mesenteric ischemia given recent negative CT abdomen.  No BM in 4 days. Diffuse abdominal pain on exam. Grimacing to pain. Passing gas.   -KUB r/o obstruction -Oxy IR 5 mg x 1 -Miralax BID -Senna BID -Would avoid Magnesium containing enemas in ESRD -Serial abdominal exams, if worsens would obtain CTA to r/o mesenteric ischemia  Elevated Liver Enzymes Acute. Stable.  Trending down.  Unknown etiology.  Likely elevated in setting of hepatic congestion secondary to missed HD. RUQ u/s showed hepatic steatosis.  CT liver negative for portal vein obstruction, however notable for small pancreatic cyst along tail and recommended repeat imaging in 2 years -Continue to hold statin -Continue to trend LFT's -Continue to hold hepatotoxic agents  ESRD MWF Mentation has improved since dialysis 10/28.  Right Fem cath exchanged prior to HD.   -Nephrology following, appreciate recommendations -IR following , appreciate recommendations -Daily BMP -Avoid nephrotoxic agents -Daily weights -Strict I&O  Type 2 DM CBG overnight 143-160.  No hypoglycemic  events. -Continue to monitor CBG -Encourage po intake -Nutritional supplementation TID  CAD, s/p NSVT/pacemaker Asymptomatic -Continue home medications BID  HLD -Continue to hold statin in setting of elevated liver enzymes   FEN/GI: Renal/Heart healthy PPx: Heparin Dispo:Home pending clinical improvement . Barriers include none.   Subjective:  No acute events overnight.  Denies any chest pain, shortness of breath, or vomiting.  Has some nausea and continued hiccups.  Reports no abdominal pain, although grimaces during palpation of abdomen on exam.  Reports no BM for 3-4 days.  Objective: Temp:  [97.9 F (36.6 C)-98.7 F (37.1 C)] 97.9 F (36.6 C) (10/30 0303) Pulse Rate:  [83-94] 87 (10/30 0303) Resp:  [16-18] 18 (10/30 0303) BP: (140-162)/(81-92) 140/85 (10/30 0303) SpO2:  [97 %-100 %] 98 % (10/30 0303) Physical Exam:  General: 80 y.o. male in NAD Cardio: RRR no m/r/g Lungs: CTAB, no wheezing, no rhonchi, no crackles, no IWOB on room air Abdomen: Soft, diffuse abdominal tenderness to palpation, non-distended, positive bowel sounds Skin: warm and dry Extremities: No edema   Laboratory: Recent Labs  Lab 08/08/21 1823 08/09/21 0215 08/10/21 0140  WBC 12.2* 13.2* 12.8*  HGB 9.7* 9.6* 9.2*  HCT 29.9* 28.9* 26.8*  PLT 161 156 159   Recent Labs  Lab 08/09/21 0043 08/09/21 1234 08/10/21 0140  NA 134* 131* 130*  K 5.0 4.1 3.7  CL 93* 96* 93*  CO2 19* 19* 24  BUN 86* 31* 37*  CREATININE 13.34* 6.74* 7.88*  CALCIUM 8.6* 9.1 9.0  PROT 6.0* 5.9* 6.4*  BILITOT 1.4* 1.1 0.8  ALKPHOS 101 109 123  ALT 903* 876* 831*  AST 1,082* 778* 558*  GLUCOSE 100* 96 148*  Imaging/Diagnostic Tests: CT LIVER ABDOMEN W WO CONTRAST  Result Date: 08/09/2021 CLINICAL DATA:  Elevated transaminases. EXAM: CT ABDOMEN WITHOUT AND WITH CONTRAST TECHNIQUE: Multidetector CT imaging of the abdomen was performed following the standard protocol before and following the bolus  administration of intravenous contrast. CONTRAST:  19mL OMNIPAQUE IOHEXOL 350 MG/ML SOLN COMPARISON:  CT abdomen 06/07/2021 FINDINGS: Lower chest:  Lung bases are clear. Hepatobiliary: Arterial phase imaging demonstrates no enhancing hepatic lesion. Portal venous phase imaging demonstrates patency of the hepatic ducts. No enhancing lesion. No biliary duct dilatation. Common bile duct normal. 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 No evidence of acute or chronic pancreatitis otherwise. Small cyst along the tail the pancreas measuring 11 mm (image 43/6) appears benign. No change from 03/10/2021. Spleen: Normal spleen. Adrenals/urinary tract: Adrenal glands normal. There is bilateral renal cortical thinning. Hydronephrosis Stomach/Bowel: Small hiatal hernia. 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 Vascular/Lymphatic: Large-bore dialysis catheter from an inferior approach with tip in the RIGHT atrium. Musculoskeletal: No aggressive osseous lesion IMPRESSION: 1. No liver abnormality by CT imaging.  Portal veins are patent. 2. Variant pancreatic ductal anatomy (ductus divisum). No evidence acute or chronic pancreatitis. 3. Small cyst along the tail the pancreas without worrisome imaging characteristics. Consider follow-up imaging in 2 years. This recommendation follows ACR consensus guidelines: Management of Incidental Pancreatic Cysts: A White Paper of the ACR Incidental Findings Committee. J Am Coll Radiol 2376;28:315-176. 4. 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 Electronically Signed   By: Suzy Bouchard M.D.   On: 08/09/2021 13:04     Carollee Leitz, MD 08/10/2021, 6:31 AM PGY-3, [facility] Intern pager: [phone], text pages welcome

## 2021-08-10 NOTE — Progress Notes (Signed)
Called pt's daughter [name]) back with an update.

## 2021-08-10 NOTE — Progress Notes (Addendum)
Subjective: More alert today, some abdominal discomfort but noted admitting team giving MiraLAX also some Reglan for hiccups  Objective Vital signs in last 24 hours: Vitals:   08/10/21 0004 08/10/21 0303 08/10/21 0728 08/10/21 1149  BP: (!) 150/87 140/85 124/83 (!) 163/93  Pulse: 91 87 84 82  Resp: 17 18 16 20   Temp: 98.7 F (37.1 C) 97.9 F (36.6 C) 98.7 F (37.1 C) 98.4 F (36.9 C)  TempSrc: Axillary Axillary Oral Axillary  SpO2: 97% 98% 98% 100%  Weight:      Height:       Weight change:  Physical Exam: General: Soft-spoken and chronically ill-appearing elderly  male NAD Lungs: CTA nonlabored  Heart: RRR no MRG Abdomen: NABS soft, epigastric minimal tenderness / non-distended  Lower extremities: No pedal edema minimal thigh edema bilaterally  dialysis Access: R thigh TDC dressing dry and clear   Dialysis Orders: Center: [place]  on MWF. 180NRe, 4 hours, BFR 400, DFR Auto 1.5, EDW 68.5kg, 2K, 2 Ca, TDC Heparin 4000 unit bolus Venofer 100mg  IV q HD 10/19-11/9 Calcitriol 2.80mcg PO q HD   Problem/Plan:  TDC malfunction: Dialysis center unable to access. IR consulted. AND  IR 10/28=HD catheter right femoral exchange with IVC venoplasty  ESRD:  MWF schedule, last dialysis was Monday due to nausea and catheter malfunction.  TDC procedure as above /not volume overloaded on exam.  A.m. K3.7 HD done late Friday 10/28 into Saturday a.m. continue on schedule next dialysis tomorrow   10/31 Nausea/elevated LFTs: LFTs signficantly elevated, now improving RUQ US unremarkable. Statin on hold. nausea/multifactorial=may be partially due to uremia, also some constipation getting MiraLAX today and Reglan per admit team =Management per primary team.  Hypertension/volume: BP moderately elevated on admit, dialysis improved dialysis, on metoprolol.    Anemia: Of ESRD Hgb at goal as outpatient, not on ESA , 9.7 <9.6 <9.2 this a.m./start Aranesp 25 MCG q. Monday HD Merdis Delay receiving iron outpatient but will  hold given acute infection.  Monitor Hgb trend    Metabolic bone disease: Calcium controlled. Phos 8.0. on  10/28 continue renvela, recheck labs predialysis  Ernest Haber, PA-C [facility] Beeper [phone] 08/10/2021,12:23 PM  LOS: 2 days   Labs: Basic Metabolic Panel: Recent Labs  Lab 08/08/21 0526 08/08/21 1823 08/08/21 1844 08/09/21 0043 08/09/21 1234 08/10/21 0140  NA 136 135   < > 134* 131* 130*  K 5.6* 5.4*   < > 5.0 4.1 3.7  CL 92* 93*   < > 93* 96* 93*  CO2 21* 20*   < > 19* 19* 24  GLUCOSE 124* 67*   < > 100* 96 148*  BUN 72* 77*   < > 86* 31* 37*  CREATININE 13.27* 12.89*   < > 13.34* 6.74* 7.88*  CALCIUM 9.0 8.7*   < > 8.6* 9.1 9.0  PHOS 9.1* 8.0*  --   --   --   --    < > = values in this interval not displayed.   Liver Function Tests: Recent Labs  Lab 08/09/21 0043 08/09/21 1234 08/10/21 0140  AST 1,082* 778* 558*  ALT 903* 876* 831*  ALKPHOS 101 109 123  BILITOT 1.4* 1.1 0.8  PROT 6.0* 5.9* 6.4*  ALBUMIN 3.1* 2.8* 2.9*   Recent Labs  Lab 07/12/2021 1645  LIPASE 47   No results for input(s): AMMONIA in the last 168 hours. CBC: Recent Labs  Lab 08/06/21 1258 08/11/2021 1645 08/08/21 0526 08/08/21 1823 08/09/21 0215 08/10/21 0140  WBC 6.0 19.6* 15.4* 12.2* 13.2* 12.8*  NEUTROABS 4.5 16.4*  --   --   --   --   HGB 9.9* 10.5* 9.6* 9.7* 9.6* 9.2*  HCT 32.2* 33.1* 28.9* 29.9* 28.9* 26.8*  MCV 99.1 96.5 93.5 94.9 94.1 91.5  PLT 173 190 162 161 156 159   Cardiac Enzymes: Recent Labs  Lab 08/08/21 0827  CKTOTAL 225   CBG: Recent Labs  Lab 08/09/21 1939 08/09/21 2202 08/10/21 0005 08/10/21 0304 08/10/21 1156  GLUCAP 196* 143* 140* 160* 134*    Medications:  [START ON 08/11/2021] vancomycin      aspirin  81 mg Oral Daily   Chlorhexidine Gluconate Cloth  6 each Topical Q0600   feeding supplement (NEPRO CARB STEADY)  237 mL Oral BID BM   heparin  5,000 Units Subcutaneous Q8H   lidocaine  1 patch Transdermal Q24H    metoprolol tartrate  12.5 mg Oral BID   polyethylene glycol  17 g Oral BID   senna  1 tablet Oral BID   sevelamer carbonate  800 mg Oral TID WC   vancomycin variable dose per unstable renal function (pharmacist dosing)   Does not apply See admin instructions   I have seen and examined this patient and agree with plan and assessment in the above note with renal recommendations/intervention highlighted. May need to evaluate liver vasculature patency per IR. Governor Rooks Hiilani Jetter,MD 08/10/2021 12:54 PM

## 2021-08-10 NOTE — Progress Notes (Signed)
FPTS Interim Progress Note  S:wet to reassess patients abdominal pain. Had received Oxy IR 5 mg earlier today.  Denies any pain and still has no BM  O: BP 140/89 (BP Location: Left Wrist)   Pulse 82   Temp 98.8 F (37.1 C) (Oral)   Resp 18   Ht 5\' 7"  (1.702 m)   Wt 72.5 kg   SpO2 98%   BMI 25.03 kg/m    Physical Exam:  General: 80 y.o. male in NAD Abdomen: Soft, non-tender to palpation, non-distended, positive bowel sounds  A/P: Abdominal pain KUB negative for obstruction.   Tolerated Oxy IR  If continues to have abdominal pain will proceed with CTA abd to r/o mesenteric ischemia   Carollee Leitz, MD 08/10/2021, 8:56 PM PGY-3, [place] Medicine Service pager [phone]

## 2021-08-11 ENCOUNTER — Inpatient Hospital Stay (HOSPITAL_COMMUNITY): Payer: Medicare PPO

## 2021-08-11 DIAGNOSIS — R4182 Altered mental status, unspecified: Secondary | ICD-10-CM | POA: Diagnosis not present

## 2021-08-11 DIAGNOSIS — I129 Hypertensive chronic kidney disease with stage 1 through stage 4 chronic kidney disease, or unspecified chronic kidney disease: Secondary | ICD-10-CM | POA: Diagnosis not present

## 2021-08-11 DIAGNOSIS — Z992 Dependence on renal dialysis: Secondary | ICD-10-CM | POA: Diagnosis not present

## 2021-08-11 DIAGNOSIS — N186 End stage renal disease: Secondary | ICD-10-CM | POA: Diagnosis not present

## 2021-08-11 LAB — CBC
HCT: 25.4 % — ABNORMAL LOW (ref 39.0–52.0)
Hemoglobin: 8.5 g/dL — ABNORMAL LOW (ref 13.0–17.0)
MCH: 31 pg (ref 26.0–34.0)
MCHC: 33.5 g/dL (ref 30.0–36.0)
MCV: 92.7 fL (ref 80.0–100.0)
Platelets: 166 10*3/uL (ref 150–400)
RBC: 2.74 MIL/uL — ABNORMAL LOW (ref 4.22–5.81)
RDW: 16.5 % — ABNORMAL HIGH (ref 11.5–15.5)
WBC: 11.2 10*3/uL — ABNORMAL HIGH (ref 4.0–10.5)
nRBC: 3.4 % — ABNORMAL HIGH (ref 0.0–0.2)

## 2021-08-11 LAB — COMPREHENSIVE METABOLIC PANEL
ALT: 603 U/L — ABNORMAL HIGH (ref 0–44)
AST: 278 U/L — ABNORMAL HIGH (ref 15–41)
Albumin: 2.7 g/dL — ABNORMAL LOW (ref 3.5–5.0)
Alkaline Phosphatase: 109 U/L (ref 38–126)
Anion gap: 15 (ref 5–15)
BUN: 49 mg/dL — ABNORMAL HIGH (ref 8–23)
CO2: 22 mmol/L (ref 22–32)
Calcium: 8.8 mg/dL — ABNORMAL LOW (ref 8.9–10.3)
Chloride: 91 mmol/L — ABNORMAL LOW (ref 98–111)
Creatinine, Ser: 9.89 mg/dL — ABNORMAL HIGH (ref 0.61–1.24)
GFR, Estimated: 5 mL/min — ABNORMAL LOW (ref >60)
Glucose, Bld: 132 mg/dL — ABNORMAL HIGH (ref 70–99)
Potassium: 3.9 mmol/L (ref 3.5–5.1)
Sodium: 128 mmol/L — ABNORMAL LOW (ref 135–145)
Total Bilirubin: 0.8 mg/dL (ref 0.3–1.2)
Total Protein: 5.8 g/dL — ABNORMAL LOW (ref 6.5–8.1)

## 2021-08-11 LAB — BASIC METABOLIC PANEL
Anion gap: 17 — ABNORMAL HIGH (ref 5–15)
BUN: 54 mg/dL — ABNORMAL HIGH (ref 8–23)
CO2: 23 mmol/L (ref 22–32)
Calcium: 8.9 mg/dL (ref 8.9–10.3)
Chloride: 89 mmol/L — ABNORMAL LOW (ref 98–111)
Creatinine, Ser: 10.58 mg/dL — ABNORMAL HIGH (ref 0.61–1.24)
GFR, Estimated: 4 mL/min — ABNORMAL LOW (ref >60)
Glucose, Bld: 136 mg/dL — ABNORMAL HIGH (ref 70–99)
Potassium: 3.9 mmol/L (ref 3.5–5.1)
Sodium: 129 mmol/L — ABNORMAL LOW (ref 135–145)

## 2021-08-11 LAB — GLUCOSE, CAPILLARY
Glucose-Capillary: 146 mg/dL — ABNORMAL HIGH (ref 70–99)
Glucose-Capillary: 119 mg/dL — ABNORMAL HIGH (ref 70–99)
Glucose-Capillary: 70 mg/dL (ref 70–99)

## 2021-08-11 LAB — PATHOLOGIST SMEAR REVIEW

## 2021-08-11 LAB — TROPONIN I (HIGH SENSITIVITY)
Troponin I (High Sensitivity): 24 ng/L — ABNORMAL HIGH (ref <18)
Troponin I (High Sensitivity): 25 ng/L — ABNORMAL HIGH (ref <18)

## 2021-08-11 LAB — AMMONIA: Ammonia: 62 umol/L — ABNORMAL HIGH (ref 9–35)

## 2021-08-11 MED ORDER — HEPARIN SODIUM (PORCINE) 1000 UNIT/ML IJ SOLN
5100.0000 [IU] | Freq: Once | INTRAMUSCULAR | Status: AC
  Filled 2021-08-11: qty 5.1

## 2021-08-11 MED ORDER — LACTULOSE 10 GM/15ML PO SOLN
10.0000 g | Freq: Every day | ORAL | Status: DC
  Administered 2021-08-11 – 2021-08-12 (×2): 10 g via ORAL
  Filled 2021-08-11 (×2): qty 15

## 2021-08-11 MED ORDER — HEPARIN SODIUM (PORCINE) 1000 UNIT/ML IJ SOLN
INTRAMUSCULAR | Status: AC
  Administered 2021-08-11: 5100 [IU] via INTRAVENOUS
  Filled 2021-08-11: qty 6

## 2021-08-11 MED ORDER — IOHEXOL 350 MG/ML SOLN
75.0000 mL | Freq: Once | INTRAVENOUS | Status: AC | PRN
  Administered 2021-08-11: 75 mL via INTRAVENOUS

## 2021-08-11 MED ORDER — FAMOTIDINE 20 MG PO TABS
10.0000 mg | ORAL_TABLET | Freq: Every day | ORAL | Status: DC
  Administered 2021-08-11 – 2021-08-12 (×2): 10 mg via ORAL
  Filled 2021-08-11 (×2): qty 1

## 2021-08-11 MED ORDER — SODIUM CHLORIDE 0.9 % IV SOLN: 100.0000 mg | INTRAVENOUS | Status: DC

## 2021-08-11 NOTE — Care Management Important Message (Signed)
Important Message  Patient Details  Name: William [name] MRN: [id number] Date of Birth: 02/01/1941   Medicare Important Message Given:  Yes     [name] 08/11/2021, 2:25 PM

## 2021-08-11 NOTE — Progress Notes (Signed)
Patient with complaints of chest pain 9/10. Patient bp 167/92 resp 16 heart 84. Juanell Fairly Nephrology nurse practitioner notified. Telephone order given for STAT EKG and to notify primary team. Telephone orders placed into epic.

## 2021-08-11 NOTE — NC FL2 (Signed)
[place] LEVEL OF CARE SCREENING TOOL     IDENTIFICATION  Patient Name: William [name] Birthdate: 1941-08-24 Sex: male Admission Date (Current Location): 08/10/2021  [facility] and [place] Number:  Herbalist and Address:  The . [facility], [place] [address] [place], [place] [address]      Provider Number: [id number]  Attending Physician Name and Address:  Lenoria Chime, MD  Relative Name and Phone Number:       Current Level of Care: Hospital Recommended Level of Care: [place] Prior Approval Number:    Date Approved/Denied:   PASRR Number: [id number]  Discharge Plan: SNF    Current Diagnoses: Patient Active Problem List   Diagnosis Date Noted   Complication associated with dialysis catheter 08/08/2021   Elevated liver enzymes    Hemodialysis catheter present ([place]) 06/13/2021   Balance problem 06/13/2021   Falls frequently 06/13/2021   Failed hearing screening 06/13/2021   Advance directive in chart 06/12/2021   Dementia with behavioral disturbance 04/08/2021   Frailty    Elevated transaminase level 03/11/2021   Hypovitaminosis D 02/06/2020   PAD (peripheral artery disease) ([place]) 02/06/2020   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   Iron deficiency anemia, unspecified 11/30/2018   ESRD (end stage renal disease) on dialysis ([place]) 11/19/2018   Anemia in chronic kidney disease 77/82/4235   Chronic systolic (congestive) heart failure ([place]) 11/18/2018   Sarcoidosis, unspecified 11/18/2018   Secondary hyperparathyroidism of renal origin ([place]) 11/18/2018   Type 2 diabetes mellitus with diabetic peripheral angiopathy without gangrene ([place]) 11/18/2018   Ventricular tachycardia, non-sustained    Hypertension associated with diabetes ([place]) 10/30/2017   Hyperlipidemia associated with type 2 diabetes mellitus ([place]) 10/30/2017   CAD with stent 10/26/2014   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     Orientation RESPIRATION BLADDER Height & Weight     Self  Normal Incontinent, External catheter Weight: 156 lb 1.4 oz (70.8 kg) Height:  5\' 7"  (170.2 cm)  BEHAVIORAL SYMPTOMS/MOOD NEUROLOGICAL BOWEL NUTRITION STATUS      Continent Diet (see dc summary)  AMBULATORY STATUS COMMUNICATION OF NEEDS Skin   Limited Assist Verbally Normal                       Personal Care Assistance Level of Assistance  Bathing, Feeding, Dressing Bathing Assistance: Limited assistance Feeding assistance: Limited assistance Dressing Assistance: Limited assistance     Functional Limitations Info             [place]  PT (By licensed PT), OT (By licensed OT)     PT Frequency: 5x/week OT Frequency: 5x/week            Contractures Contractures Info: Not present    Additional Factors Info  Code Status, Allergies Code Status Info: Full Allergies Info: Carvedilol           Current Medications (08/11/2021):  This is the current hospital active medication list Current Facility-Administered Medications  Medication Dose Route Frequency Provider Last Rate Last Admin   aspirin chewable tablet 81 mg  81 mg Oral Daily Brimage, Vondra, DO   81 mg at 08/11/21 0941   Chlorhexidine Gluconate Cloth 2 % PADS 6 each  6 each Topical Q0600 Janalee Dane, PA-C   6 each at 08/11/21 3614   Darbepoetin Alfa (ARANESP) injection 25 mcg  25 mcg Intravenous Q Mon-HD Ernest Haber, PA-C       dextrose  50 % solution 50 mL  1 ampule Intravenous PRN Brimage, Vondra, DO   50 mL at 08/09/21 0728   famotidine (PEPCID) tablet 10 mg  10 mg Oral Daily [name], MD   10 mg at 08/11/21 1309   feeding supplement (NEPRO CARB STEADY) liquid 237 mL  237 mL Oral BID BM Ernest Haber, PA-C   237 mL at 08/11/21 1309   heparin injection 5,000 Units  5,000 Units Subcutaneous Q8H Brimage, Vondra, DO   5,000 Units at 08/11/21 1308   [START ON 08/13/2021] iron sucrose (VENOFER) 100 mg in  sodium chloride 0.9 % 100 mL IVPB  100 mg Intravenous Q M,W,F-HD Valentina Gu, NP       lactulose (CHRONULAC) 10 GM/15ML solution 10 g  10 g Oral Daily [name], MD       lidocaine (LIDODERM) 5 % 1 patch  1 patch Transdermal Q24H Precious Gilding, DO   1 patch at 08/10/21 1725   metoprolol tartrate (LOPRESSOR) tablet 12.5 mg  12.5 mg Oral BID Brimage, Vondra, DO   12.5 mg at 08/10/21 2129   polyethylene glycol (MIRALAX / GLYCOLAX) packet 17 g  17 g Oral BID Carollee Leitz, MD   17 g at 08/11/21 0941   senna (SENOKOT) tablet 8.6 mg  1 tablet Oral BID Carollee Leitz, MD   8.6 mg at 08/11/21 0941   sevelamer carbonate (RENVELA) tablet 800 mg  800 mg Oral TID WC Brimage, Vondra, DO   800 mg at 08/11/21 1308     Discharge Medications: Please see discharge summary for a list of discharge medications.  Relevant Imaging Results:  Relevant Lab Results:   Additional Information ss#[id number]. Dialysis at [facility] on MWF. Pt arrives at 11:30 for 11:50 chair time. Moderna COVID-19 Vaccine 01/05/2020 , 12/08/2019  Benard Halsted, LCSW

## 2021-08-11 NOTE — Progress Notes (Signed)
William [name] came to bedside to see patient. Primary team notified and at bedside. EKG team at bedside to obtain EKG.

## 2021-08-11 NOTE — TOC Initial Note (Signed)
Transition of Care [facility]) - Initial/Assessment Note    Patient Details  Name: William [name] MRN: [id number] Date of Birth: 1941/05/03  [facility] CM/SW Contact:    Benard Halsted, LCSW Phone Number: 08/11/2021, 4:01 PM  Clinical Narrative:                 CSW received consult for possible SNF placement at time of discharge. CSW spoke with patient's daughter, [name]. She reported that patient's family is currently unable to care for patient at their home given patient's current physical needs and fall risk. She expressed understanding of PT recommendation and is agreeable to SNF placement at time of discharge. CSW discussed insurance authorization process and will provide Medicare SNF ratings list. Patient has received COVID vaccines. CSW will conduct search for SNF that can accept a dialysis patient.   Skilled Nursing Rehab Facilities-   [email or web address]   Ratings out of 5 possible   Name Address  Phone # [place] Inspection Overall  [facility] [address] [place] 5 5 2 4   [facility]  [address], [place] [phone] 4 2 5 5   [facility] [place], [place] 4 1 1 1   [facility] [place], [place] 2 2 4 4   [facility] [address], [place] 2 1 1 1   [place], [place] 3 1 4 3   [facility] [address] [place] 5 2 2 3   [facility] [address] [place] 4 1 2 1   [place] [facility] [place] 5 1 2 2   [facility] [phone], [address] [phone] 4 1 1 1   [facility] [address], [facility] [phone] 4 1 2 1   [facility] [address] [place] 4 1 1 1       Expected Discharge Plan:  [place] Barriers to Discharge: Continued Medical Work up, [address], SNF Pending bed offer   Patient Goals and CMS Choice Patient states their goals for this hospitalization and ongoing recovery are:: Rehab CMS Medicare.gov Compare Post Acute Care list provided to:: Patient Represenative (must comment) Choice offered to / list presented to : Adult Children  Expected Discharge Plan and Services Expected Discharge Plan: [place] In-house Referral: Clinical Social Work   Post Acute Care Choice: [place] Living arrangements for the past 2 months: [place]                                      Prior Living Arrangements/Services Living arrangements for the past 2 months: Single Family Home Lives with:: Self Patient language and need for interpreter reviewed:: Yes Do you feel safe going back to the place where you live?: Yes      Need for Family Participation in Patient Care: Yes (Comment) Care giver support system in place?: Yes (comment)   Criminal Activity/Legal Involvement Pertinent to Current Situation/Hospitalization: No - Comment as needed  Activities of Daily Living Home Assistive Devices/Equipment: Cane (specify quad or straight), Walker (specify type), Wheelchair, Dentures (specify type), Raised toilet seat with rails, Shower chair with back ADL Screening (condition at time of admission) Patient's cognitive ability adequate to safely complete daily activities?: Yes Is the patient deaf or have  difficulty hearing?: No Does the patient have difficulty seeing, even when wearing glasses/contacts?: No Does the patient have difficulty concentrating, remembering, or making decisions?: No Patient able to express need for assistance with ADLs?: Yes Does the patient have difficulty dressing or bathing?: No Independently performs ADLs?: Yes (appropriate for developmental age) Does the patient have difficulty  walking or climbing stairs?: Yes Weakness of Legs: Both Weakness of Arms/Hands: None  Permission Sought/Granted Permission sought to share information with : Facility Sport and exercise psychologist, Family Supports Permission granted to share information with : Yes, Verbal Permission Granted  Share Information with NAME: Benita  Permission granted to share info w AGENCY: SNFs  Permission granted to share info w Relationship: Daughter  Permission granted to share info w Contact Information: [phone]  Emotional Assessment Appearance:: Appears stated age Attitude/Demeanor/Rapport: Unable to Assess Affect (typically observed): Unable to Assess Orientation: : Oriented to Self Alcohol / Substance Use: Not Applicable Psych Involvement: No (comment)  Admission diagnosis:  Elevated liver enzymes [Q73.4] Complication associated with dialysis catheter [T82.9XXA] Patient Active Problem List   Diagnosis Date Noted   Complication associated with dialysis catheter 08/08/2021   Elevated liver enzymes    Hemodialysis catheter present ([place]) 06/13/2021   Balance problem 06/13/2021   Falls frequently 06/13/2021   Failed hearing screening 06/13/2021   Advance directive in chart 06/12/2021   Dementia with behavioral disturbance 04/08/2021   Frailty    Elevated transaminase level 03/11/2021   Hypovitaminosis D 02/06/2020   PAD (peripheral artery disease) ([place]) 02/06/2020   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   Iron deficiency anemia, unspecified 11/30/2018   ESRD (end stage renal disease) on dialysis ([place]) 11/19/2018   Anemia in chronic kidney disease 19/37/9024   Chronic systolic (congestive) heart failure ([place]) 11/18/2018   Sarcoidosis, unspecified 11/18/2018   Secondary hyperparathyroidism of renal origin ([place]) 11/18/2018   Type 2 diabetes mellitus with diabetic peripheral angiopathy without gangrene ([place]) 11/18/2018   Ventricular tachycardia, non-sustained    Hypertension associated with diabetes ([place])  10/30/2017   Hyperlipidemia associated with type 2 diabetes mellitus ([place]) 10/30/2017   CAD with stent 10/26/2014   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   PCP:  Rise Patience, DO Pharmacy:   [place] - [address] [place] [address] Phone: [phone] Fax: [place], [place] mail services [facility] [place] [address] Phone: [phone] Fax: [phone]  [facility] [address] [place] [address] Phone: [phone] Fax: [phone]     Social Determinants of Health (SDOH) Interventions    Readmission Risk Interventions Readmission Risk Prevention Plan 08/11/2021  Transportation Screening Complete  Medication Review ([place]) Complete  PCP or Specialist appointment within 3-5 days of discharge Complete  HRI or Home Care Consult Complete  SW Recovery Care/Counseling Consult Complete  Palliative Care Screening Not [place] Complete  Some recent data might be hidden

## 2021-08-11 NOTE — Progress Notes (Signed)
William [name] Progress Note   Subjective:Seen in room. More confused this AM. HD today on schedule. SCr 10.6 BUN 54. Needs to run 4 hours.   Objective Vitals:   08/11/21 0250 08/11/21 0459 08/11/21 0736 08/11/21 0950  BP: (!) 161/89 (!) 144/80 123/72 (!) 160/90  Pulse: 77 80 78   Resp: 16 18 18    Temp: 98.7 F (37.1 C) 98.6 F (37 C) 98.4 F (36.9 C) 98.3 F (36.8 C)  TempSrc: Axillary Axillary Axillary Axillary  SpO2: 99% 99% 98%   Weight:  71.5 kg    Height:       Physical Exam General: Chronically ill appearing elderly male in NAD Heart: S1,S2 RRR No M/R/G Lungs: CTAB A/P Abdomen: NABS, NT, incontinent of stool Extremities: No LE edema Dialysis Access: R femoral TDC Drsg intact   Additional Objective Labs: Basic Metabolic Panel: Recent Labs  Lab 08/08/21 0526 08/08/21 1823 08/08/21 1844 08/10/21 0140 08/11/21 0129 08/11/21 1027  NA 136 135   < > 130* 128* 129*  K 5.6* 5.4*   < > 3.7 3.9 3.9  CL 92* 93*   < > 93* 91* 89*  CO2 21* 20*   < > 24 22 23   GLUCOSE 124* 67*   < > 148* 132* 136*  BUN 72* 77*   < > 37* 49* 54*  CREATININE 13.27* 12.89*   < > 7.88* 9.89* 10.58*  CALCIUM 9.0 8.7*   < > 9.0 8.8* 8.9  PHOS 9.1* 8.0*  --   --   --   --    < > = values in this interval not displayed.   Liver Function Tests: Recent Labs  Lab 08/09/21 1234 08/10/21 0140 08/11/21 0129  AST 778* 558* 278*  ALT 876* 831* 603*  ALKPHOS 109 123 109  BILITOT 1.1 0.8 0.8  PROT 5.9* 6.4* 5.8*  ALBUMIN 2.8* 2.9* 2.7*   Recent Labs  Lab 08/09/2021 1645  LIPASE 47   CBC: Recent Labs  Lab 08/06/21 1258 07/26/2021 1645 08/08/21 0526 08/08/21 1823 08/09/21 0215 08/10/21 0140 08/11/21 0129  WBC 6.0 19.6* 15.4* 12.2* 13.2* 12.8* 11.2*  NEUTROABS 4.5 16.4*  --   --   --   --   --   HGB 9.9* 10.5* 9.6* 9.7* 9.6* 9.2* 8.5*  HCT 32.2* 33.1* 28.9* 29.9* 28.9* 26.8* 25.4*  MCV 99.1 96.5 93.5 94.9 94.1 91.5 92.7  PLT 173 190 162 161 156 159 166   Blood  Culture    Component Value Date/Time   SDES BLOOD RIGHT HAND 08/08/2021 0651   SPECREQUEST  08/08/2021 0651    BOTTLES DRAWN AEROBIC AND ANAEROBIC Blood Culture results may not be optimal due to an inadequate volume of blood received in culture bottles   CULT  08/08/2021 0651    NO GROWTH 3 DAYS Performed at [facility], [place] [address] [place], [place] [address]    REPTSTATUS PENDING 08/08/2021 0981    Cardiac Enzymes: Recent Labs  Lab 08/08/21 0827  CKTOTAL 225   CBG: Recent Labs  Lab 08/10/21 1156 08/10/21 1639 08/10/21 2001 08/11/21 0247 08/11/21 0739  GLUCAP 134* 139* 174* 146* 119*   Iron Studies: No results for input(s): IRON, TIBC, TRANSFERRIN, FERRITIN in the last 72 hours. @lablastinr3 @ Studies/Results: DG Abd 1 View  Result Date: 08/10/2021 CLINICAL DATA:  Abdominal pain. EXAM: ABDOMEN - 1 VIEW COMPARISON:  August 09, 2021. FINDINGS: The bowel gas pattern is normal. Dialysis catheter is seen passing through the  inferior vena cava. IMPRESSION: No abnormal bowel dilatation. Electronically Signed   By: Marijo Conception M.D.   On: 08/10/2021 13:17   CT Angio Abd/Pel w/ and/or w/o  Result Date: 08/11/2021 CLINICAL DATA:  Left upper quadrant abdominal pain EXAM: CTA ABDOMEN AND PELVIS WITHOUT AND WITH CONTRAST TECHNIQUE: Multidetector CT imaging of the abdomen and pelvis was performed using the standard protocol during bolus administration of intravenous contrast. Multiplanar reconstructed images and MIPs were obtained and reviewed to evaluate the vascular anatomy. CONTRAST:  35mL OMNIPAQUE IOHEXOL 350 MG/ML SOLN COMPARISON:  08/09/2021 FINDINGS: VASCULAR Imaging is limited by respiratory motion artifact, particularly involving the arterial phase imaging. Aorta: Extensive atherosclerotic calcification. No aneurysm or dissection. No periaortic inflammatory change. Celiac: Patent without evidence of aneurysm, dissection, vasculitis or significant stenosis. SMA:  Patent without evidence of aneurysm, dissection, vasculitis or significant stenosis. Renals: Beaded appearance of the proximal and mid right renal artery and proximal left renal artery are suggestive of changes of fibromuscular dysplasia. No aneurysm or dissection. IMA: Patent without evidence of aneurysm, dissection, vasculitis or significant stenosis. Inflow: Mild atherosclerotic calcification. No hemodynamically significant stenosis. Internal iliac arteries are patent bilaterally. Proximal Outflow: Moderate atherosclerotic disease within the superficial femoral arteries bilaterally. Profundus femoral arteries are patent. Veins: Right common femoral hemodialysis catheter tip is seen with its tip at the level of the tricuspid valve. The infrarenal inferior vena cava and right common and external iliac veins are completely collapsed surrounding the catheter suggesting chronic stenoses of these vessels. Review of the MIP images confirms the above findings. NON-VASCULAR Lower chest: Trace right pleural effusion. Minimal right basilar atelectasis. Pacemaker leads noted within the right heart. 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 Hepatobiliary: Heterogeneous attenuation of the liver may relate to the phase of contrast enhancement. No enhancing intrahepatic masses identified. The gallbladder is decompressed and is otherwise unremarkable. No intra or extrahepatic biliary ductal dilation. Pancreas: Duodenal diverticulum noted within the pancreatic head. Variant anatomy with incomplete pancreatic divisum. Stable 14 mm cystic lesion within the tail the pancreas. Spleen: Unremarkable Adrenals/Urinary Tract: The adrenal glands are unremarkable. The kidneys are markedly atrophic in keeping with chronic renal insufficiency. Multiple simple cortical cysts are seen within the upper pole the left kidney. The bladder is circumferentially thick walled suggesting changes of diffuse cystitis or chronic  bladder outlet obstruction. The bladder is not distended. Stomach/Bowel: Mild distal colonic diverticulosis. Duodenal diverticula noted within the second and third portion of the duodenum. The stomach, small bowel, and large bowel are otherwise unremarkable. The appendix is not visualized and is likely absent. No evidence of obstruction or focal inflammation. No differential enhancement of the bowel to suggest regional ischemia. No free intraperitoneal gas or fluid Lymphatic: No pathologic adenopathy. Reproductive: Prostate is unremarkable. Other: Small fat containing left inguinal hernia. Tiny fat containing umbilical hernia. Rectum is unremarkable. Musculoskeletal: No acute bone abnormality. No lytic or blastic bone lesion. IMPRESSION: VASCULAR No evidence of hemodynamically significant stenosis involving the mesenteric vasculature. No aortic aneurysm or dissection. 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 Imaging is limited by significant respiratory motion artifact. NON-VASCULAR Colonic diverticulosis without superimposed focal inflammatory change. No acute intra-abdominal pathology. No definite radiographic explanation for the patient's reported symptoms. Stable 14 mm cystic lesion within the tail the pancreas. Recommend follow up pre and post contrast MRI/MRCP or pancreatic protocol CT in 2 years if clinically indicated. This recommendation follows ACR consensus guidelines: Management  of Incidental Pancreatic Cysts: A White Paper of the ACR Incidental Findings Committee. [place] 4536;46:803-212. Aortic Atherosclerosis (ICD10-I70.0). Electronically Signed   By: Fidela Salisbury M.D.   On: 08/11/2021 02:17   Medications:   aspirin  81 mg Oral Daily   Chlorhexidine Gluconate Cloth  6 each Topical Q0600   darbepoetin (ARANESP) injection - DIALYSIS  25 mcg Intravenous Q Mon-HD   famotidine  10  mg Oral Daily   feeding supplement (NEPRO CARB STEADY)  237 mL Oral BID BM   heparin  5,000 Units Subcutaneous Q8H   lidocaine  1 patch Transdermal Q24H   metoprolol tartrate  12.5 mg Oral BID   polyethylene glycol  17 g Oral BID   senna  1 tablet Oral BID   sevelamer carbonate  800 mg Oral TID WC     Dialysis Orders: Center: [facility]  on MWF. 180NRe, 4 hours, BFR 400, DFR Auto 1.5, EDW 68.5kg, 2K, 2 Ca, TDC Heparin 4000 unit bolus Venofer 100mg  IV q HD 10/19-11/9 Calcitriol 2.55mcg PO q HD   Problem/Plan:  TDC malfunction: Dialysis center unable to access. IR consulted. AND  IR 10/28=HD catheter right femoral exchange with IVC venoplasty  ESRD:  MWF schedule, last dialysis was Monday due to nausea and catheter malfunction.  HD today on schedule.  Nausea/elevated LFTs: LFTs signficantly elevated, now improving RUQ US unremarkable. Statin on hold. nausea/multifactorial-may be partially due to uremia. BUN 54 today.  Hypertension/volume: BP moderately elevated on admit, variable today. UF as tolerated in HD.   Anemia: HGB 8.5. Starting ESA today. Resume Fe load.   Metabolic bone disease: Calcium controlled. Phos 8.0. on  10/28 continue renvela, recheck labs predialysis  William Shroff H. Nely Dedmon NP-C 08/11/2021, 1:16 PM  [employer] [phone]

## 2021-08-11 NOTE — Progress Notes (Signed)
@   2106 provider called to inform RN that ok to hold giving soap sud enema until after the head CT is done.   2355: patient went down for CT of head via transport service  0018: Pt return from CT

## 2021-08-11 NOTE — Progress Notes (Signed)
Family Medicine Teaching Service Daily Progress Note Intern Pager: [phone]  Patient name: William [name] Medical record number: [id number] Date of birth: 1941-04-17 Age: 80 y.o. Gender: male  Primary Care Provider: Rise Patience, DO Consultants: nephrology, IR Code Status: FULL  Pt Overview and Major Events to Date:  10/28: admitted to [facility], right fem HD cath exchanged  Assessment and Plan: Patient is an 80 year old male who presented with Vas-Cath failure in the setting of sepsis and elevated LFTs.  PMH significant for ESRD on MWF HD, CAD, HTN, T2DM, Hx multiple falls, subdural hematoma, nonsustained V. tach s/p pacemaker.   Acute abdominal pain, concomitant encephalopathy Patient has developed acute abdominal pain since admission.  Downtrending leukocytosis, no bloody BMs documented, CT abdomen/pelvis negative for acute cause, CTA negative for mesenteric ischemia, KUB negative for obstruction or large stool burden. ABD very tender to palpation this AM, hyperactive BS. Patient grimaces in pain and only nods in response to questions, able to follow commands.  Currently we are considering constipation and extra-abdominal causes of pain.  -Soap suds enema -EKG and troponin -Repeat CXR -Obtain CTH given encephalopathy and Hx of subdural hematoma -Start home Pepcid for hiccups -Consider GI consult -PT/OT -Consider palliative consult going forward  Elevated LFTs AST/ALT continue to trend down to 278/603 this morning. However, patient had INR of 1.6. Thought to be 2/2 hepatic congestion in the context of missed HD. - Continue to hold statin -- INR - Daily CMP to trend LFTs  ESRD on MWF HD Patient due for dialysis today. Hgb of 8.5 this AM, slight drop from 9.6 previously.Threshold of 8. No signs of active bleeding.   - Nephrology consult - Daily CBC - Avoid nephrotoxic agents - Daily weights - Strict I's and O's  T2DM Fasting CBG of 146 this morning.  CBGs have been in the mid  100s consistently. - Continue to monitor CBGs - Encourage p.o. intake - Feeding supplement 3 times daily  CAD, NSVT s/p pacemaker Currently asymptomatic - Continue home metoprolol 12.5 mg twice daily  Hyperlipidemia - Continue to hold statin  FEN/GI: Renal and heart healthy diet PPx: heparin Dispo: TBD  Subjective:  On interview this morning patient is in obvious discomfort and responds to questions with nods of the head and grimacing. He consents to an enema. He is unable to answer orientation questions.  Objective: Temp:  [98.4 F (36.9 C)-98.8 F (37.1 C)] 98.6 F (37 C) (10/31 0459) Pulse Rate:  [77-84] 80 (10/31 0459) Resp:  [16-20] 18 (10/31 0459) BP: (124-168)/(80-93) 144/80 (10/31 0459) SpO2:  [98 %-100 %] 99 % (10/31 0459) Weight:  [157 lb 10.1 oz (71.5 kg)] 157 lb 10.1 oz (71.5 kg) (10/31 0459) Physical Exam Vitals reviewed.  Cardiovascular:     Rate and Rhythm: Normal rate and regular rhythm.     Heart sounds: No murmur heard. Pulmonary:     Effort: Pulmonary effort is normal.     Breath sounds: Normal breath sounds.  Abdominal:     Comments: Very TTP, hyperactive BS  Neurological:     Mental Status: He is alert.     Laboratory: Recent Labs  Lab 08/09/21 0215 08/10/21 0140 08/11/21 0129  WBC 13.2* 12.8* 11.2*  HGB 9.6* 9.2* 8.5*  HCT 28.9* 26.8* 25.4*  PLT 156 159 166   Recent Labs  Lab 08/09/21 1234 08/10/21 0140 08/11/21 0129  NA 131* 130* 128*  K 4.1 3.7 3.9  CL 96* 93* 91*  CO2 19* 24 22  BUN 31*  37* 49*  CREATININE 6.74* 7.88* 9.89*  CALCIUM 9.1 9.0 8.8*  PROT 5.9* 6.4* 5.8*  BILITOT 1.1 0.8 0.8  ALKPHOS 109 123 109  ALT 876* 831* 603*  AST 778* 558* 278*  GLUCOSE 96 148* 132*    Imaging/Diagnostic Tests: None new   Corky Sox, MD PGY-1 [place] Intern pager: [phone], text pages welcome

## 2021-08-11 NOTE — Progress Notes (Addendum)
FPTS Interim Progress Note  S: Reports abdominal pain. States he is not feeling well.    O: BP (!) 155/91 (BP Location: Left Wrist)   Pulse 84   Temp 98.6 F (37 C) (Axillary)   Resp 18   Ht 5\' 7"  (1.702 m)   Wt 72.5 kg   SpO2 99%   BMI 25.03 kg/m    GEN: elderly male slow to respond  RESP: no increased work of breathing  ABD: tender to palpation in epigastric and LUQ, bowels sounds present in all quadrants  NEURO: slowed mentation, not at baseline, grunting   A/P:  Abdominal Pain  Acute Liver Failure (improving) Pt significantly different than admission and previous rounding nights. RN agrees. He complains of abdominal pain. Recently developed hiccups but none tonight.  - Obtain CTA Abdomen/Pelvis  - Orders reviewed. Labs for AM ordered, which was adjusted as needed.    Lyndee Hensen, DO 08/11/2021, 12:55 AM PGY-3,  Family Medicine Night Resident  Please page [phone] with questions

## 2021-08-11 NOTE — Evaluation (Signed)
Physical Therapy Evaluation Patient Details Name: William [name] MRN: [id number] DOB: [birth year] Today's Date: 08/11/2021  History of Present Illness  80 yo admitted with nausea/emesis from HD with inability to access Rt fem fistula. Pt with vascath failure in setting of sepsis. 10/28 right fem tunneled cath exchange. PMhx: ESRD on HD MWF, CAD, HTN, DM, SDH s/p craniotomy, vtach s/p PPM, falls  Clinical Impression  Pt with flat affect and decreased orientation on arrival. Pt with incontinent stool unaware and lunch tray untouched. Pt reporting he has a bedroom upstairs at home with daughter but daughter states he lives downstairs. Pt with decreased cognition, strength, balance, transfers, gait and function who will benefit from acute therapy to maximize mobility, safety and function. Daughter confirmed pt was caring for himself and only using cane intermittently with pt currently requiring min-mod assist for all basic transfers with significant decline in processing. Encouraged OOB to chair daily with nursing assist.         Recommendations for follow up therapy are one component of a multi-disciplinary discharge planning process, led by the attending physician.  Recommendations may be updated based on patient status, additional functional criteria and insurance authorization.  Follow Up Recommendations Skilled nursing-short term rehab (<3 hours/day)    Assistance Recommended at Discharge Frequent or constant Supervision/Assistance  Functional Status Assessment Patient has had a recent decline in their functional status and demonstrates the ability to make significant improvements in function in a reasonable and predictable amount of time.  Equipment Recommendations  None recommended by PT    Recommendations for Other Services       Precautions / Restrictions Precautions Precautions: Fall      Mobility  Bed Mobility Overal bed mobility: Needs Assistance Bed Mobility: Supine to  Sit     Supine to sit: Mod assist;HOB elevated     General bed mobility comments: HOb 30 degrees with physical assist to initiate and pivot legs and pelvis to EOb as well as elevate trunk from surface    Transfers Overall transfer level: Needs assistance   Transfers: Sit to/from Stand Sit to Stand: Mod assist           General transfer comment: mod assist to rise from surface with RW present and min assist to balance and stabilize in standing with bil UE support on RW. Pt with total assist for pericare in standing due to incontinent stool    Ambulation/Gait Ambulation/Gait assistance: Min assist Gait Distance (Feet): 8 Feet Assistive device: Rolling walker (2 wheels) Gait Pattern/deviations: Trunk flexed;Shuffle;Decreased stride length;Narrow base of support   Gait velocity interpretation: <1.31 ft/sec, indicative of household ambulator General Gait Details: pt with flexed trunk with shuffling gait requiring physical assist to advance and direct RW. Very slow gait with limited foot clearance. mod assist for direction change and backing to chair. Unsafe to progress gait further without close chair follow  Stairs            Wheelchair Mobility    Modified Rankin (Stroke Patients Only)       Balance Overall balance assessment: Needs assistance Sitting-balance support: Feet supported Sitting balance-Leahy Scale: Poor Sitting balance - Comments: initial min assist Eob with progression to minguard   Standing balance support: Bilateral upper extremity supported Standing balance-Leahy Scale: Poor Standing balance comment: min assist with progression to minguard with bil UE support on RW  Pertinent Vitals/Pain Pain Assessment: No/denies pain    Home Living Family/patient expects to be discharged to:: Private residence Living Arrangements: Children Available Help at Discharge: Family;Available 24 hours/day Type of Home:  House Home Access: Stairs to enter Entrance Stairs-Rails: Left Entrance Stairs-Number of Steps: 3   Home Layout: One level Home Equipment: Cane - single Barista (2 wheels);Wheelchair - manual      Prior Function Prior Level of Function : Independent/Modified Independent             Mobility Comments: pt was walking with cane at times ADLs Comments: able to bathe and dress on his own and even cooked his own breakfast, does not drive     Hand Dominance        Extremity/Trunk Assessment   Upper Extremity Assessment Upper Extremity Assessment: Generalized weakness    Lower Extremity Assessment Lower Extremity Assessment: Generalized weakness    Cervical / Trunk Assessment Cervical / Trunk Assessment: Other exceptions Cervical / Trunk Exceptions: rounded shoulders  Communication   Communication: No difficulties  Cognition Arousal/Alertness: Awake/alert Behavior During Therapy: Flat affect Overall Cognitive Status: Impaired/Different from baseline Area of Impairment: Orientation;Attention;Memory;Following commands;Safety/judgement;Problem solving                 Orientation Level: Disoriented to;Time;Place Current Attention Level: Sustained Memory: Decreased short-term memory Following Commands: Follows one step commands with increased time Safety/Judgement: Decreased awareness of safety;Decreased awareness of deficits   Problem Solving: Slow processing;Decreased initiation;Requires verbal cues;Requires tactile cues General Comments: pt with very slow processing both mentally and physically with max cues for initiation and progression of mobility        General Comments      Exercises General Exercises - Lower Extremity Short Arc Quad: AROM;Both;5 reps;Seated   Assessment/Plan    PT Assessment Patient needs continued PT services  PT Problem List Decreased strength;Decreased mobility;Decreased safety awareness;Decreased activity  tolerance;Decreased cognition;Decreased balance;Decreased knowledge of use of DME       PT Treatment Interventions Gait training;Patient/family education;Therapeutic exercise;Stair training;Balance training;Functional mobility training;Neuromuscular re-education;DME instruction;Therapeutic activities;Cognitive remediation    PT Goals (Current goals can be found in the Care Plan section)  Acute Rehab PT Goals Patient Stated Goal: return to walking and home PT Goal Formulation: With patient/family Time For Goal Achievement: 08/25/21 Potential to Achieve Goals: Fair    Frequency Min 3X/week   Barriers to discharge Decreased caregiver support      Co-evaluation               AM-PAC PT "6 Clicks" Mobility  Outcome Measure Help needed turning from your back to your side while in a flat bed without using bedrails?: A Lot Help needed moving from lying on your back to sitting on the side of a flat bed without using bedrails?: A Lot Help needed moving to and from a bed to a chair (including a wheelchair)?: A Lot Help needed standing up from a chair using your arms (e.g., wheelchair or bedside chair)?: A Little Help needed to walk in hospital room?: A Little Help needed climbing 3-5 steps with a railing? : Total 6 Click Score: 13    End of Session Equipment Utilized During Treatment: Gait belt Activity Tolerance: Patient limited by fatigue Patient left: in chair;with call bell/phone within reach;with chair alarm set Nurse Communication: Mobility status;Precautions PT Visit Diagnosis: Unsteadiness on feet (R26.81);Muscle weakness (generalized) (M62.81);Difficulty in walking, not elsewhere classified (R26.2);Other abnormalities of gait and mobility (R26.89);History of falling (Z91.81)    Time: 2353-6144  PT Time Calculation (min) (ACUTE ONLY): 23 min   Charges:   PT Evaluation $PT Eval Moderate Complexity: 1 Mod PT Treatments $Therapeutic Activity: 8-22 mins        Lucan Riner P,  PT Acute Rehabilitation Services Pager: [phone] Office: [phone]   Jaleel Allen B Emmanuell Kantz 08/11/2021, 1:46 PM

## 2021-08-11 NOTE — Progress Notes (Signed)
Pt receives out-pt HD at [facility] on MWF. Pt arrives at 11:30 for 11:50 chair time. Will assist as needed.    Melven Sartorius Renal Navigator [phone]

## 2021-08-11 NOTE — Progress Notes (Signed)
Called to see patient for report of 9/10 chest pain. Patient is found in the HD room with dialysis not having been started yet. On assessment patient grimaces frequently and is in obvious discomfort. He endorses chest pain and localizes it to the right side. The pain is reproducible with palpation. The patient grimaces more notably with abdominal palpation. EKG appears unchanged from previous. Troponin from a few hours earlier was 24, repeat in process. CXR from 2 PM unremarkable for acute process. Patient is okay to continue on with dialysis. Will re-assess patient in several hours, after HD.   Corky Sox, MD PGY-1 [place] Intern pager: [phone], text pages welcome

## 2021-08-12 ENCOUNTER — Inpatient Hospital Stay (HOSPITAL_COMMUNITY): Payer: Medicare PPO

## 2021-08-12 DIAGNOSIS — R4182 Altered mental status, unspecified: Secondary | ICD-10-CM | POA: Diagnosis not present

## 2021-08-12 LAB — CBC
HCT: 26.4 % — ABNORMAL LOW (ref 39.0–52.0)
Hemoglobin: 8.7 g/dL — ABNORMAL LOW (ref 13.0–17.0)
MCH: 30.6 pg (ref 26.0–34.0)
MCHC: 33 g/dL (ref 30.0–36.0)
MCV: 93 fL (ref 80.0–100.0)
Platelets: 144 10*3/uL — ABNORMAL LOW (ref 150–400)
RBC: 2.84 MIL/uL — ABNORMAL LOW (ref 4.22–5.81)
RDW: 16.5 % — ABNORMAL HIGH (ref 11.5–15.5)
WBC: 11.3 10*3/uL — ABNORMAL HIGH (ref 4.0–10.5)
nRBC: 2 % — ABNORMAL HIGH (ref 0.0–0.2)

## 2021-08-12 LAB — COMPREHENSIVE METABOLIC PANEL
ALT: 483 U/L — ABNORMAL HIGH (ref 0–44)
AST: 183 U/L — ABNORMAL HIGH (ref 15–41)
Albumin: 3 g/dL — ABNORMAL LOW (ref 3.5–5.0)
Alkaline Phosphatase: 111 U/L (ref 38–126)
Anion gap: 11 (ref 5–15)
BUN: 26 mg/dL — ABNORMAL HIGH (ref 8–23)
CO2: 25 mmol/L (ref 22–32)
Calcium: 8.7 mg/dL — ABNORMAL LOW (ref 8.9–10.3)
Chloride: 97 mmol/L — ABNORMAL LOW (ref 98–111)
Creatinine, Ser: 6.88 mg/dL — ABNORMAL HIGH (ref 0.61–1.24)
GFR, Estimated: 8 mL/min — ABNORMAL LOW (ref >60)
Glucose, Bld: 108 mg/dL — ABNORMAL HIGH (ref 70–99)
Potassium: 4.3 mmol/L (ref 3.5–5.1)
Sodium: 133 mmol/L — ABNORMAL LOW (ref 135–145)
Total Bilirubin: 0.9 mg/dL (ref 0.3–1.2)
Total Protein: 6.5 g/dL (ref 6.5–8.1)

## 2021-08-12 LAB — GLUCOSE, CAPILLARY
Glucose-Capillary: 111 mg/dL — ABNORMAL HIGH (ref 70–99)
Glucose-Capillary: 97 mg/dL (ref 70–99)
Glucose-Capillary: 93 mg/dL (ref 70–99)
Glucose-Capillary: 117 mg/dL — ABNORMAL HIGH (ref 70–99)
Glucose-Capillary: 138 mg/dL — ABNORMAL HIGH (ref 70–99)

## 2021-08-12 LAB — PROTIME-INR
INR: 1.3 — ABNORMAL HIGH (ref 0.8–1.2)
Prothrombin Time: 15.8 seconds — ABNORMAL HIGH (ref 11.4–15.2)

## 2021-08-12 MED ORDER — SODIUM CHLORIDE 0.9 % IV SOLN
125.0000 mg | INTRAVENOUS | Status: DC
  Administered 2021-08-13: 125 mg via INTRAVENOUS
  Filled 2021-08-12: qty 10

## 2021-08-12 MED ORDER — METOPROLOL TARTRATE 25 MG PO TABS
25.0000 mg | ORAL_TABLET | Freq: Two times a day (BID) | ORAL | Status: DC
  Administered 2021-08-12: 25 mg via ORAL
  Filled 2021-08-12: qty 1

## 2021-08-12 NOTE — Evaluation (Signed)
Occupational Therapy Evaluation Patient Details Name: William [name] MRN: [id number] DOB: July 21, 1941 Today's Date: 08/12/2021   History of Present Illness 80 yo admitted with nausea/emesis from HD with inability to access Rt fem fistula. Pt with vascath failure in setting of sepsis. 10/28 right fem tunneled cath exchange. PMhx: ESRD on HD MWF, CAD, HTN, DM, SDH s/p craniotomy, vtach s/p PPM, falls   Clinical Impression   Pt presents with decreased balance, activity tolerance, and cognition. Pt lethargic and oriented x1 to person only during eval, unable to reliably answer questions. Pt with decreased initiation, processing, and sequencing, requiring extended time and max cueing to complete basic tasks. Pt currently requiring Min A for UB ADLs, Mod A for LB ADLs, and Max A for toileting. Pt also requiring Mod A for functional transfers/mobility. Per PT eval, pt independent with ADLs at baseline, living with daughter. Pt would likely benefit from SNF placement for further rehab to improve safety/independence with ADLs and functional mobility/transfers prior to return home. Will follow acutely.     Recommendations for follow up therapy are one component of a multi-disciplinary discharge planning process, led by the attending physician.  Recommendations may be updated based on patient status, additional functional criteria and insurance authorization.   Follow Up Recommendations  Skilled nursing-short term rehab (<3 hours/day)    Assistance Recommended at Discharge Frequent or constant Supervision/Assistance  Functional Status Assessment  Patient has had a recent decline in their functional status and demonstrates the ability to make significant improvements in function in a reasonable and predictable amount of time.  Equipment Recommendations  Other (comment) (TBD)    Recommendations for Other Services       Precautions / Restrictions Precautions Precautions: Fall Restrictions Weight  Bearing Restrictions: No      Mobility Bed Mobility Overal bed mobility: Needs Assistance Bed Mobility: Supine to Sit     Supine to sit: Mod assist;HOB elevated     General bed mobility comments: Physical assist to lift trunk and max cues to scoot hips toward EOB.    Transfers Overall transfer level: Needs assistance Equipment used: Rolling walker (2 wheels) Transfers: Sit to/from Stand Sit to Stand: Mod assist                  Balance Overall balance assessment: Needs assistance Sitting-balance support: Feet supported Sitting balance-Leahy Scale: Fair     Standing balance support: Bilateral upper extremity supported Standing balance-Leahy Scale: Poor                             ADL either performed or assessed with clinical judgement   ADL Overall ADL's : Needs assistance/impaired Eating/Feeding: Set up;Sitting   Grooming: Minimal assistance;Cueing for sequencing;Sitting   Upper Body Bathing: Minimal assistance;Cueing for sequencing;Sitting   Lower Body Bathing: Moderate assistance;Cueing for sequencing;Sit to/from stand   Upper Body Dressing : Minimal assistance;Cueing for sequencing;Sitting   Lower Body Dressing: Moderate assistance;Cueing for sequencing;Sit to/from stand   Toilet Transfer: Moderate assistance;Ambulation;BSC;Rolling walker (2 wheels)   Toileting- Clothing Manipulation and Hygiene: Maximal assistance       Functional mobility during ADLs: Moderate assistance General ADL Comments: Pt limited by balance, activity tolerance, and cognition. Requiring significant cueing for initiation, sequencing, and efficiency with functional tasks.     Vision   Vision Assessment?: No apparent visual deficits     Perception     Praxis      Pertinent Vitals/Pain Pain Assessment: No/denies  pain     Hand Dominance Right   Extremity/Trunk Assessment Upper Extremity Assessment Upper Extremity Assessment: Generalized weakness    Lower Extremity Assessment Lower Extremity Assessment: Defer to PT evaluation   Cervical / Trunk Assessment Cervical / Trunk Assessment: Other exceptions Cervical / Trunk Exceptions: rounded shoulders   Communication Communication Communication: No difficulties   Cognition Arousal/Alertness: Lethargic Behavior During Therapy: Flat affect Overall Cognitive Status: Impaired/Different from baseline Area of Impairment: Orientation;Attention;Memory;Following commands;Safety/judgement;Problem solving                 Orientation Level: Disoriented to;Time;Place Current Attention Level: Sustained Memory: Decreased short-term memory Following Commands: Follows one step commands inconsistently;Follows one step commands with increased time Safety/Judgement: Decreased awareness of safety;Decreased awareness of deficits   Problem Solving: Slow processing;Decreased initiation;Requires verbal cues;Requires tactile cues General Comments: Pt with slow processing, requiring extended time and max verbal cues for initiation and following basic commands.     General Comments       Exercises     Shoulder Instructions      Home Living Family/patient expects to be discharged to:: Private residence Living Arrangements: Children Available Help at Discharge: Family;Available 24 hours/day Type of Home: House Home Access: Stairs to enter [employer] of Steps: 3 Entrance Stairs-Rails: Left Home Layout: One level         Bathroom Toilet: Standard     Home Equipment: Cane - single Barista (2 wheels);Wheelchair - manual   Additional Comments: Pt unable to provide description of shower setup at home, however documentation from previous admission states that pt sponge bathes at baseline.      Prior Functioning/Environment Prior Level of Function : Independent/Modified Independent                        OT Problem List: Decreased strength;Decreased  activity tolerance;Impaired balance (sitting and/or standing);Decreased cognition;Decreased safety awareness;Decreased knowledge of use of DME or AE;Decreased knowledge of precautions      OT Treatment/Interventions: Self-care/ADL training;Therapeutic exercise;Neuromuscular education;Energy conservation;DME and/or AE instruction;Therapeutic activities;Cognitive remediation/compensation;Patient/family education;Balance training    OT Goals(Current goals can be found in the care plan section) Acute Rehab OT Goals Patient Stated Goal: unable to state OT Goal Formulation: Patient unable to participate in goal setting Time For Goal Achievement: 08/26/21 Potential to Achieve Goals: Good  OT Frequency: Min 2X/week   Barriers to D/C:            Co-evaluation              AM-PAC OT "6 Clicks" Daily Activity     Outcome Measure Help from another person eating meals?: A Little Help from another person taking care of personal grooming?: A Little Help from another person toileting, which includes using toliet, bedpan, or urinal?: A Lot Help from another person bathing (including washing, rinsing, drying)?: A Lot Help from another person to put on and taking off regular upper body clothing?: A Little Help from another person to put on and taking off regular lower body clothing?: A Lot 6 Click Score: 15   End of Session Equipment Utilized During Treatment: Gait belt;Rolling walker (2 wheels) Nurse Communication: Mobility status  Activity Tolerance: Patient limited by lethargy Patient left: in chair;with call bell/phone within reach;with chair alarm set  OT Visit Diagnosis: Unsteadiness on feet (R26.81);Other abnormalities of gait and mobility (R26.89);Muscle weakness (generalized) (M62.81);Other symptoms and signs involving cognitive function  Time: 7096-4383 OT Time Calculation (min): 19 min Charges:  OT General Charges $OT Visit: 1 Visit OT Evaluation $OT Eval Moderate  Complexity: 1 Mod  Abenezer Odonell C, OT/L  Acute Rehab [place] 08/12/2021, 12:00 PM

## 2021-08-12 NOTE — Consult Note (Signed)
   [facility] CM Inpatient Consult   08/12/2021  William [name] 12-Aug-1941 [id number]  [facility] [ACO] Patient: William [name] Medicare PPO  Patient was screened for [place] Management services. Patient will have the transition of care call conducted by the primary care provider. This patient is also in an Embedded practice which has a chronic disease management Embedded Care Management team.and has been active with the Chronic Care Management social worker. However, seems to have had difficulty maintaining contact.  Patient per encounters have been contacted through daughter with [facility] Palliative team.  Plan: Patient is being recommended for a skilled nursing facility for rehab. Will send updates to the [place] Management social worker regarding disposition planned.  Please contact for further questions,  Natividad Brood, RN BSN [facility] Liaison  [phone] business mobile phone Toll free office [phone]  Fax number: [phone] [email or web address]

## 2021-08-12 NOTE — Progress Notes (Addendum)
[facility] Progress Note   Subjective: Up in chair. Oriented to person only. No C/Os. Had C/O of chest pain yesterday prior to HD. No EKG changes per 12 lead. Primary notified. CP resolved and he tolerated HD without event.   Objective Vitals:   08/11/21 2330 08/12/21 0350 08/12/21 0518 08/12/21 0817  BP: (!) 154/88 (!) 182/76 (!) 167/88 (!) 166/81  Pulse: 92 93 84 84  Resp: 20 17  17   Temp: 97.9 F (36.6 C) 97.9 F (36.6 C)  97.7 F (36.5 C)  TempSrc: Axillary Axillary  Oral  SpO2: 99% 98% 99% 99%  Weight:      Height:       Physical Exam General: Chronically ill appearing elderly male in NAD Heart: S1,S2 RRR No M/R/G Lungs: CTAB A/P Abdomen: NABS, NT, incontinent of stool Extremities: No LE edema Dialysis Access: R femoral TDC Drsg intact   Additional Objective Labs: Basic Metabolic Panel: Recent Labs  Lab 08/08/21 0526 08/08/21 1823 08/08/21 1844 08/11/21 0129 08/11/21 1027 08/12/21 0205  NA 136 135   < > 128* 129* 133*  K 5.6* 5.4*   < > 3.9 3.9 4.3  CL 92* 93*   < > 91* 89* 97*  CO2 21* 20*   < > 22 23 25   GLUCOSE 124* 67*   < > 132* 136* 108*  BUN 72* 77*   < > 49* 54* 26*  CREATININE 13.27* 12.89*   < > 9.89* 10.58* 6.88*  CALCIUM 9.0 8.7*   < > 8.8* 8.9 8.7*  PHOS 9.1* 8.0*  --   --   --   --    < > = values in this interval not displayed.   Liver Function Tests: Recent Labs  Lab 08/10/21 0140 08/11/21 0129 08/12/21 0205  AST 558* 278* 183*  ALT 831* 603* 483*  ALKPHOS 123 109 111  BILITOT 0.8 0.8 0.9  PROT 6.4* 5.8* 6.5  ALBUMIN 2.9* 2.7* 3.0*   Recent Labs  Lab 07/14/2021 1645  LIPASE 47   CBC: Recent Labs  Lab 08/06/21 1258 07/25/2021 1645 08/08/21 0526 08/08/21 1823 08/09/21 0215 08/10/21 0140 08/11/21 0129 08/12/21 0205  WBC 6.0 19.6*   < > 12.2* 13.2* 12.8* 11.2* 11.3*  NEUTROABS 4.5 16.4*  --   --   --   --   --   --   HGB 9.9* 10.5*   < > 9.7* 9.6* 9.2* 8.5* 8.7*  HCT 32.2* 33.1*   < > 29.9* 28.9* 26.8*  25.4* 26.4*  MCV 99.1 96.5   < > 94.9 94.1 91.5 92.7 93.0  PLT 173 190   < > 161 156 159 166 144*   < > = values in this interval not displayed.   Blood Culture    Component Value Date/Time   SDES BLOOD RIGHT HAND 08/08/2021 0651   SPECREQUEST  08/08/2021 9604    BOTTLES DRAWN AEROBIC AND ANAEROBIC Blood Culture results may not be optimal due to an inadequate volume of blood received in culture bottles   CULT  08/08/2021 0651    NO GROWTH 4 DAYS Performed at [facility], [place] [address] [place], [place] [address]    REPTSTATUS PENDING 08/08/2021 1191    Cardiac Enzymes: Recent Labs  Lab 08/08/21 0827  CKTOTAL 225   CBG: Recent Labs  Lab 08/11/21 0247 08/11/21 0739 08/11/21 2047 08/12/21 0253 08/12/21 0816  GLUCAP 146* 119* 70 111* 97   Iron Studies: No results for input(s):  IRON, TIBC, TRANSFERRIN, FERRITIN in the last 72 hours. @lablastinr3 @ Studies/Results: DG Chest 2 View  Result Date: 08/11/2021 CLINICAL DATA:  Cough, weakness and pain. EXAM: CHEST - 2 VIEW COMPARISON:  07/25/2021. FINDINGS: Trachea is midline. Heart size stable. Thoracic aorta is calcified. Pacemaker lead tips are in the right atrium and right ventricle. IVC is approach catheter terminates in the right atrium, near the tricuspid valve. Lungs are low in volume but clear. No pleural fluid. A stent is seen in the right axillary region. IMPRESSION: Low lung volumes without acute finding. Electronically Signed   By: Lorin Picket M.D.   On: 08/11/2021 14:18   CT HEAD WO CONTRAST (5MM)  Result Date: 08/12/2021 CLINICAL DATA:  Altered mental status EXAM: CT HEAD WITHOUT CONTRAST TECHNIQUE: Contiguous axial images were obtained from the base of the skull through the vertex without intravenous contrast. COMPARISON:  03/26/2021 FINDINGS: Brain: There is no mass, hemorrhage or extra-axial collection. There is generalized atrophy without lobar predilection. Hypodensity of the white matter is most  commonly associated with chronic microvascular disease. Vascular: No abnormal hyperdensity of the major intracranial arteries or dural venous sinuses. No intracranial atherosclerosis. Skull: Remote right-sided craniotomy. Sinuses/Orbits: No fluid levels or advanced mucosal thickening of the visualized paranasal sinuses. No mastoid or middle ear effusion. The orbits are normal. IMPRESSION: Chronic microvascular ischemia and generalized atrophy without acute intracranial abnormality. Cerebral Atrophy (ICD10-G31.9). Electronically Signed   By: Ulyses Jarred M.D.   On: 08/12/2021 00:31   CT Angio Abd/Pel w/ and/or w/o  Result Date: 08/11/2021 CLINICAL DATA:  Left upper quadrant abdominal pain EXAM: CTA ABDOMEN AND PELVIS WITHOUT AND WITH CONTRAST TECHNIQUE: Multidetector CT imaging of the abdomen and pelvis was performed using the standard protocol during bolus administration of intravenous contrast. Multiplanar reconstructed images and MIPs were obtained and reviewed to evaluate the vascular anatomy. CONTRAST:  50mL OMNIPAQUE IOHEXOL 350 MG/ML SOLN COMPARISON:  08/09/2021 FINDINGS: VASCULAR Imaging is limited by respiratory motion artifact, particularly involving the arterial phase imaging. Aorta: Extensive atherosclerotic calcification. No aneurysm or dissection. No periaortic inflammatory change. Celiac: Patent without evidence of aneurysm, dissection, vasculitis or significant stenosis. SMA: Patent without evidence of aneurysm, dissection, vasculitis or significant stenosis. Renals: Beaded appearance of the proximal and mid right renal artery and proximal left renal artery are suggestive of changes of fibromuscular dysplasia. No aneurysm or dissection. IMA: Patent without evidence of aneurysm, dissection, vasculitis or significant stenosis. Inflow: Mild atherosclerotic calcification. No hemodynamically significant stenosis. Internal iliac arteries are patent bilaterally. Proximal Outflow: Moderate  atherosclerotic disease within the superficial femoral arteries bilaterally. Profundus femoral arteries are patent. Veins: Right common femoral hemodialysis catheter tip is seen with its tip at the level of the tricuspid valve. The infrarenal inferior vena cava and right common and external iliac veins are completely collapsed surrounding the catheter suggesting chronic stenoses of these vessels. Review of the MIP images confirms the above findings. NON-VASCULAR Lower chest: Trace right pleural effusion. Minimal right basilar atelectasis. Pacemaker leads noted within the right heart. 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 Hepatobiliary: Heterogeneous attenuation of the liver may relate to the phase of contrast enhancement. No enhancing intrahepatic masses identified. The gallbladder is decompressed and is otherwise unremarkable. No intra or extrahepatic biliary ductal dilation. Pancreas: Duodenal diverticulum noted within the pancreatic head. Variant anatomy with incomplete pancreatic divisum. Stable 14 mm cystic lesion within the tail the pancreas. Spleen: Unremarkable Adrenals/Urinary Tract: The adrenal glands are unremarkable. The kidneys are markedly atrophic in keeping with  chronic renal insufficiency. Multiple simple cortical cysts are seen within the upper pole the left kidney. The bladder is circumferentially thick walled suggesting changes of diffuse cystitis or chronic bladder outlet obstruction. The bladder is not distended. Stomach/Bowel: Mild distal colonic diverticulosis. Duodenal diverticula noted within the second and third portion of the duodenum. The stomach, small bowel, and large bowel are otherwise unremarkable. The appendix is not visualized and is likely absent. No evidence of obstruction or focal inflammation. No differential enhancement of the bowel to suggest regional ischemia. No free intraperitoneal gas or fluid Lymphatic: No pathologic adenopathy.  Reproductive: Prostate is unremarkable. Other: Small fat containing left inguinal hernia. Tiny fat containing umbilical hernia. Rectum is unremarkable. Musculoskeletal: No acute bone abnormality. No lytic or blastic bone lesion. IMPRESSION: VASCULAR No evidence of hemodynamically significant stenosis involving the mesenteric vasculature. No aortic aneurysm or dissection. 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 Imaging is limited by significant respiratory motion artifact. NON-VASCULAR Colonic diverticulosis without superimposed focal inflammatory change. No acute intra-abdominal pathology. No definite radiographic explanation for the patient's reported symptoms. Stable 14 mm cystic lesion within the tail the pancreas. Recommend follow up pre and post contrast MRI/MRCP or pancreatic protocol CT in 2 years if clinically indicated. This recommendation follows ACR consensus guidelines: Management of Incidental Pancreatic Cysts: A White Paper of the ACR Incidental Findings Committee. [place] 0263;78:[id number]. Aortic Atherosclerosis (ICD10-I70.0). Electronically Signed   By: Fidela Salisbury M.D.   On: 08/11/2021 02:17   Medications:  [START ON 08/13/2021] ferric gluconate (FERRLECIT) IVPB      aspirin  81 mg Oral Daily   Chlorhexidine Gluconate Cloth  6 each Topical Q0600   darbepoetin (ARANESP) injection - DIALYSIS  25 mcg Intravenous Q Mon-HD   famotidine  10 mg Oral Daily   feeding supplement (NEPRO CARB STEADY)  237 mL Oral BID BM   heparin  5,000 Units Subcutaneous Q8H   lactulose  10 g Oral Daily   lidocaine  1 patch Transdermal Q24H   metoprolol tartrate  12.5 mg Oral BID   polyethylene glycol  17 g Oral BID   senna  1 tablet Oral BID   sevelamer carbonate  800 mg Oral TID WC     Other: Small fat containing left inguinal hernia. Tiny fat containing umbilical hernia. Rectum is  unremarkable. Musculoskeletal: No acute bone abnormality. No lytic or blastic bone lesion. IMPRESSION: VASCULAR No evidence of hemodynamically significant stenosis involving the mesenteric vasculature. No aortic aneurysm or dissection. 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 Imaging is limited by significant respiratory motion artifact. NON-VASCULAR Colonic diverticulosis without superimposed focal inflammatory change. No acute intra-abdominal pathology. No definite radiographic explanation for the patient's reported symptoms. Stable 14 mm cystic lesion within the tail the pancreas. Recommend follow up pre and post contrast MRI/MRCP or pancreatic protocol CT in 2 years if clinically indicated. This recommendation follows ACR consensus guidelines: Management of Incidental Pancreatic Cysts: A White Paper of the ACR Incidental Findings Committee. [place] [id number];28:[id number]. Aortic Atherosclerosis (ICD10-I70.0). Electronically Signed   By: Fidela Salisbury M.D.   On: 08/11/2021 02:17   Medications:  [START ON 08/13/2021] ferric gluconate (FERRLECIT) IVPB      aspirin  81 mg Oral Daily   Chlorhexidine Gluconate Cloth  6 each Topical Q0600   darbepoetin (ARANESP) injection - DIALYSIS  25 mcg  Intravenous Q Mon-HD   famotidine  10 mg Oral Daily   feeding supplement (NEPRO CARB STEADY)  237 mL Oral BID BM   heparin  5,000 Units Subcutaneous Q8H   lactulose  10 g Oral Daily   lidocaine  1 patch Transdermal Q24H   metoprolol tartrate  12.5 mg Oral BID   polyethylene glycol  17 g Oral BID   senna  1 tablet Oral BID   sevelamer carbonate  800 mg Oral TID WC     Dialysis Orders: Center: [facility]  on MWF. 180NRe, 4 hours, BFR 400, DFR Auto 1.5, EDW 68.5kg, 2K, 2 Ca, TDC Heparin 4000 unit bolus Venofer 100mg  IV q HD 10/19-11/9 Calcitriol 2.23mcg PO q HD   Problem/Plan:  TDC malfunction: Dialysis  center unable to access. IR consulted. S/P ight femoral TDC exchange with IVC venoplasty 08/08/2021 ESRD:  MWF. Next HD 08/13/2021 Nausea/elevated LFTs: LFTs signficantly elevated, now improving RUQ US unremarkable. Statin on hold. nausea/multifactorial. Per primary. Hypertension/volume: No evidence of overt volume overload but BP not controlled. Net UF 1 Liter 08/11/2021. Still above OP EDW. Increase Metoprolol to 25 mcg PO BID per OP orders. Attempt to lower EDW if tolerated tomorrow in HD.   Anemia: HGB 8.7. Starting ESA today. Resume Fe load.   Metabolic bone disease: Calcium controlled. Phos 8.0. on  10/28 continue renvela, recheck labs predialysis  Anandi Abramo H. Lakeitha Basques NP-C 08/12/2021, 12:22 PM  [employer] [phone]       Jimmye Norman. Jashiya Bassett NP-C 08/12/2021, 12:22 PM  [employer] [phone]

## 2021-08-12 NOTE — Progress Notes (Signed)
Family Medicine Teaching Service Daily Progress Note Intern Pager: [phone]  Patient name: William [name] Medical record number: [id number] Date of birth: 1940/12/23 Age: 80 y.o. Gender: male  Primary Care Provider: Rise Patience, DO Consultants: Nephrology, IR Code Status: Full  Pt Overview and Major Events to Date:  10/28: admitted to [facility], right fem HD cath exchanged  Assessment and Plan:  Patient is an 80 year old male who presented with Vas-Cath failure with elevated LFTs.  PMH significant for ESRD on MWF HD, CAD, HTN, T2DM, Hx multiple falls, subdural hematoma, nonsustained V. tach s/p pacemaker.    Acute abdominal pain, concomitant encephalopathy Patient is no longer complaining of abdominal pain on exam. He is awake and active today. He still appears to be dazed/confused. He can respond to questions, but not give thoughtful answers. He also continues to have a downtrending leukocytosis. His EKG showed and atrial sensed, ventricular paced rhythm. His trop was 25. CT Head demonstrated chronic microvascular ischemia and generalized atrophy without acute intracranial abnormality. CXR was unremarkable. We still have not found a source of is encephalopathy, but it appears improved today. May be 2/2 the lactulose use.  We will continue his bowel regimen and trend his ammonia level. Patient BP also poorly controlled. -Continue Lactulose 10 g daily -Increase Metoprolol to 25 mg PO BID -Monitor NH4 level -Continue PT/OT -Consider palliative consult -Continue Pepcid for hiccups Vt: Htn to 180's, 150-182/76-88 WBC 11.3 (11.2, 12.8)   Elevated LFT's AST/ALT continues to trend down to 183/483 (278/603, 558/8331) this morning.   INR of 1.3.  PT: 15.8  (slightly low and slightly elevated). Liver function continues to improve, will continue to hold statin, in setting of transaminits.  Continue to hold statin Daily INR Daily CMP to trend LFTs   ESRD on MWF HD Hemoglobin this am is 8.7  (8.5, 9.2). Threshold to transfuse is 8 no signs of active bleeding -Attempt to lower EDW with HD tomorrow Follow-up nephrology recs Daily CBC Avoid nephrotoxic agents Daily weights Strict I's and O's   DM2 CABG 108 this morning.  CBGs have been in the 130's range Continue to monitor CBGs Encourage p.o. intake Feeding supplement 3 times daily  CAD, NSVT s/p pacemaker Continue home metoprolol 12.5 mg twice daily  Hyperlipidemia Continue to hold statin  FEN/GI: renal and heart healthy diet PPx: Heparin Dispo: TBD    Subjective:  Patient is awaking and sitting up in chair today. He appears dazed and is not sure where he is, but is able to answer questions. Answers are simple and he can't give more complex answer if you investigate further. Patient is also no longer complaining of abdominal pain today. Patient had 2 bowel movements yesterday, which seem to have helped with abdominal pain and encephalopathy.   Objective: Temp:  [97.7 F (36.5 C)-98.3 F (36.8 C)] 97.7 F (36.5 C) (11/01 0817) Pulse Rate:  [79-106] 84 (11/01 0817) Resp:  [16-20] 17 (11/01 0817) BP: (127-182)/(76-104) 166/81 (11/01 0817) SpO2:  [97 %-100 %] 99 % (11/01 0817) Weight:  [68 kg-70.8 kg] 70 kg (10/31 2045) Physical Exam: General: Awake, dazed, african [place] male, active, oriented to self Cardiovascular: RRR, NRMG Respiratory: CTABL, no wheezes or stridor Abdomen: Soft, NTTP Extremities: Pulses intact in all extremities. Cap refill < 2 seconds  Laboratory: Recent Labs  Lab 08/10/21 0140 08/11/21 0129 08/12/21 0205  WBC 12.8* 11.2* 11.3*  HGB 9.2* 8.5* 8.7*  HCT 26.8* 25.4* 26.4*  PLT 159 166 144*   Recent Labs  Lab  08/10/21 0140 08/11/21 0129 08/11/21 1027 08/12/21 0205  NA 130* 128* 129* 133*  K 3.7 3.9 3.9 4.3  CL 93* 91* 89* 97*  CO2 24 22 23 25   BUN 37* 49* 54* 26*  CREATININE 7.88* 9.89* 10.58* 6.88*  CALCIUM 9.0 8.8* 8.9 8.7*  PROT 6.4* 5.8*  --  6.5  BILITOT 0.8 0.8   --  0.9  ALKPHOS 123 109  --  111  ALT 831* 603*  --  483*  AST 558* 278*  --  183*  GLUCOSE 148* 132* 136* 108*      Imaging/Diagnostic Tests:   Holley Bouche, MD 08/12/2021, 8:20 AM PGY-1, [facility] Intern pager: [phone], text pages welcome

## 2021-08-12 NOTE — Progress Notes (Signed)
FPTS Interim progress note - Holley Bouche  Spoke with daughter of patient today about patient current status, improved encephalopathy, and baseline mentation/ability level. I informed daughter that he was talking today and could answer questions, but his responses didn't hold up when challenged. I asked him where he was, and he said in this room. When I asked where the room was located and he didn't respond. Daughter said that that's improved from earlier in his hospital course, where he would only give one-two word answers and then grunt or not respond. Daughter stated that at baseline he is conversational and can get around on his own. I thanked daughter for her input and told her we would continue the medicines we are giving now, as they seem to be improving his encephalopathy.   Holley Bouche, PGY-1 Pager number [phone]

## 2021-08-12 NOTE — Progress Notes (Signed)
FPTS Brief Progress Note  S: Pt reporting abdominal pain. States he saw his family today.     O: BP (!) 154/88 (BP Location: Left Wrist)   Pulse 92   Temp 97.9 F (36.6 C) (Axillary)   Resp 20   Ht 5\' 7"  (1.702 m)   Wt 70 kg   SpO2 99%   BMI 24.17 kg/m    RESP: no increased work of breathing  ABD: mild diffusely tender NEURO: follows verbal commands, LE sensation intact, grunting with soft clear speech    A/P: CT Head unremarkable. Per RN, pt has incontinent stool with PT today. Will give soap suds enema. Etiology of pt's acute change unclear.  - Orders reviewed. Labs for AM ordered, which was adjusted as needed.    Lyndee Hensen, DO 08/12/2021, 12:51 AM PGY-3, [facility] Night Resident  Please page [phone] with questions.

## 2021-08-12 DEATH — deceased

## 2021-08-13 ENCOUNTER — Inpatient Hospital Stay (HOSPITAL_COMMUNITY): Payer: Medicare PPO

## 2021-08-13 LAB — CBC
HCT: 26.5 % — ABNORMAL LOW (ref 39.0–52.0)
HCT: 23.7 % — ABNORMAL LOW (ref 39.0–52.0)
Hemoglobin: 8.6 g/dL — ABNORMAL LOW (ref 13.0–17.0)
Hemoglobin: 7.8 g/dL — ABNORMAL LOW (ref 13.0–17.0)
MCH: 30.7 pg (ref 26.0–34.0)
MCH: 30.7 pg (ref 26.0–34.0)
MCHC: 32.5 g/dL (ref 30.0–36.0)
MCHC: 32.9 g/dL (ref 30.0–36.0)
MCV: 94.6 fL (ref 80.0–100.0)
MCV: 93.3 fL (ref 80.0–100.0)
Platelets: 125 10*3/uL — ABNORMAL LOW (ref 150–400)
Platelets: 127 10*3/uL — ABNORMAL LOW (ref 150–400)
RBC: 2.8 MIL/uL — ABNORMAL LOW (ref 4.22–5.81)
RBC: 2.54 MIL/uL — ABNORMAL LOW (ref 4.22–5.81)
RDW: 17.2 % — ABNORMAL HIGH (ref 11.5–15.5)
RDW: 17.3 % — ABNORMAL HIGH (ref 11.5–15.5)
WBC: 24.8 10*3/uL — ABNORMAL HIGH (ref 4.0–10.5)
WBC: 28.6 10*3/uL — ABNORMAL HIGH (ref 4.0–10.5)
nRBC: 1.1 % — ABNORMAL HIGH (ref 0.0–0.2)
nRBC: 1.5 % — ABNORMAL HIGH (ref 0.0–0.2)

## 2021-08-13 LAB — COMPREHENSIVE METABOLIC PANEL
ALT: 279 U/L — ABNORMAL HIGH (ref 0–44)
AST: 97 U/L — ABNORMAL HIGH (ref 15–41)
Albumin: 2.8 g/dL — ABNORMAL LOW (ref 3.5–5.0)
Alkaline Phosphatase: 83 U/L (ref 38–126)
Anion gap: 25 — ABNORMAL HIGH (ref 5–15)
BUN: 49 mg/dL — ABNORMAL HIGH (ref 8–23)
CO2: 14 mmol/L — ABNORMAL LOW (ref 22–32)
Calcium: 8.8 mg/dL — ABNORMAL LOW (ref 8.9–10.3)
Chloride: 97 mmol/L — ABNORMAL LOW (ref 98–111)
Creatinine, Ser: 7.45 mg/dL — ABNORMAL HIGH (ref 0.61–1.24)
GFR, Estimated: 7 mL/min — ABNORMAL LOW (ref >60)
Glucose, Bld: 94 mg/dL (ref 70–99)
Potassium: 5 mmol/L (ref 3.5–5.1)
Sodium: 136 mmol/L (ref 135–145)
Total Bilirubin: 2.8 mg/dL — ABNORMAL HIGH (ref 0.3–1.2)
Total Protein: 6.1 g/dL — ABNORMAL LOW (ref 6.5–8.1)

## 2021-08-13 LAB — GLUCOSE, CAPILLARY
Glucose-Capillary: 127 mg/dL — ABNORMAL HIGH (ref 70–99)
Glucose-Capillary: 130 mg/dL — ABNORMAL HIGH (ref 70–99)
Glucose-Capillary: 63 mg/dL — ABNORMAL LOW (ref 70–99)
Glucose-Capillary: 76 mg/dL (ref 70–99)
Glucose-Capillary: 94 mg/dL (ref 70–99)

## 2021-08-13 LAB — PROTIME-INR
INR: 1.4 — ABNORMAL HIGH (ref 0.8–1.2)
Prothrombin Time: 17.3 seconds — ABNORMAL HIGH (ref 11.4–15.2)

## 2021-08-13 LAB — CBC WITH DIFFERENTIAL/PLATELET
Abs Immature Granulocytes: 0.24 10*3/uL — ABNORMAL HIGH (ref 0.00–0.07)
Basophils Absolute: 0.1 10*3/uL (ref 0.0–0.1)
Basophils Relative: 0 %
Eosinophils Absolute: 0 10*3/uL (ref 0.0–0.5)
Eosinophils Relative: 0 %
HCT: 23.2 % — ABNORMAL LOW (ref 39.0–52.0)
Hemoglobin: 7.3 g/dL — ABNORMAL LOW (ref 13.0–17.0)
Immature Granulocytes: 2 %
Lymphocytes Relative: 10 %
Lymphs Abs: 1.2 10*3/uL (ref 0.7–4.0)
MCH: 30 pg (ref 26.0–34.0)
MCHC: 31.5 g/dL (ref 30.0–36.0)
MCV: 95.5 fL (ref 80.0–100.0)
Monocytes Absolute: 1.7 10*3/uL — ABNORMAL HIGH (ref 0.1–1.0)
Monocytes Relative: 14 %
Neutro Abs: 8.9 10*3/uL — ABNORMAL HIGH (ref 1.7–7.7)
Neutrophils Relative %: 74 %
Platelets: 137 10*3/uL — ABNORMAL LOW (ref 150–400)
RBC: 2.43 MIL/uL — ABNORMAL LOW (ref 4.22–5.81)
RDW: 17.2 % — ABNORMAL HIGH (ref 11.5–15.5)
WBC: 12.1 10*3/uL — ABNORMAL HIGH (ref 4.0–10.5)
nRBC: 1.2 % — ABNORMAL HIGH (ref 0.0–0.2)

## 2021-08-13 LAB — PREPARE RBC (CROSSMATCH)

## 2021-08-13 LAB — HEPATIC FUNCTION PANEL
ALT: 340 U/L — ABNORMAL HIGH (ref 0–44)
AST: 104 U/L — ABNORMAL HIGH (ref 15–41)
Albumin: 3 g/dL — ABNORMAL LOW (ref 3.5–5.0)
Alkaline Phosphatase: 93 U/L (ref 38–126)
Bilirubin, Direct: 0.3 mg/dL — ABNORMAL HIGH (ref 0.0–0.2)
Indirect Bilirubin: 0.8 mg/dL (ref 0.3–0.9)
Total Bilirubin: 1.1 mg/dL (ref 0.3–1.2)
Total Protein: 6.8 g/dL (ref 6.5–8.1)

## 2021-08-13 LAB — RENAL FUNCTION PANEL
Albumin: 3 g/dL — ABNORMAL LOW (ref 3.5–5.0)
Anion gap: 20 — ABNORMAL HIGH (ref 5–15)
BUN: 57 mg/dL — ABNORMAL HIGH (ref 8–23)
CO2: 18 mmol/L — ABNORMAL LOW (ref 22–32)
Calcium: 8.8 mg/dL — ABNORMAL LOW (ref 8.9–10.3)
Chloride: 93 mmol/L — ABNORMAL LOW (ref 98–111)
Creatinine, Ser: 8.86 mg/dL — ABNORMAL HIGH (ref 0.61–1.24)
GFR, Estimated: 6 mL/min — ABNORMAL LOW (ref >60)
Glucose, Bld: 121 mg/dL — ABNORMAL HIGH (ref 70–99)
Phosphorus: 5.9 mg/dL — ABNORMAL HIGH (ref 2.5–4.6)
Potassium: 5.8 mmol/L — ABNORMAL HIGH (ref 3.5–5.1)
Sodium: 131 mmol/L — ABNORMAL LOW (ref 135–145)

## 2021-08-13 LAB — CULTURE, BLOOD (ROUTINE X 2)
Culture: NO GROWTH
Culture: NO GROWTH

## 2021-08-13 LAB — AMMONIA: Ammonia: 77 umol/L — ABNORMAL HIGH (ref 9–35)

## 2021-08-13 LAB — TROPONIN I (HIGH SENSITIVITY): Troponin I (High Sensitivity): 860 ng/L (ref <18)

## 2021-08-13 LAB — HEMOGLOBIN AND HEMATOCRIT, BLOOD
HCT: 24.9 % — ABNORMAL LOW (ref 39.0–52.0)
Hemoglobin: 8.1 g/dL — ABNORMAL LOW (ref 13.0–17.0)

## 2021-08-13 MED ORDER — HEPARIN SODIUM (PORCINE) 1000 UNIT/ML DIALYSIS: 2500.0000 [IU] | INTRAMUSCULAR | Status: DC | PRN

## 2021-08-13 MED ORDER — SODIUM CHLORIDE 0.9% IV SOLUTION: Freq: Once | INTRAVENOUS | Status: AC

## 2021-08-13 MED ORDER — PANTOPRAZOLE SODIUM 40 MG IV SOLR
40.0000 mg | Freq: Two times a day (BID) | INTRAVENOUS | Status: DC
  Administered 2021-08-13 (×2): 40 mg via INTRAVENOUS
  Filled 2021-08-13 (×2): qty 40

## 2021-08-13 MED ORDER — ORAL CARE MOUTH RINSE: 15.0000 mL | Freq: Two times a day (BID) | OROMUCOSAL | Status: DC

## 2021-08-13 MED ORDER — HEPARIN SODIUM (PORCINE) 1000 UNIT/ML DIALYSIS
1000.0000 [IU] | INTRAMUSCULAR | Status: DC | PRN
  Filled 2021-08-13: qty 1

## 2021-08-13 MED ORDER — SODIUM CHLORIDE 0.9 % IV SOLN: 100.0000 mL | INTRAVENOUS | Status: DC | PRN

## 2021-08-13 MED ORDER — ACETAMINOPHEN 325 MG PO TABS: 650.0000 mg | ORAL_TABLET | Freq: Four times a day (QID) | ORAL | Status: DC | PRN

## 2021-08-13 MED ORDER — SODIUM CHLORIDE 0.9 % IV BOLUS
250.0000 mL | Freq: Once | INTRAVENOUS | Status: AC
  Administered 2021-08-13: 250 mL via INTRAVENOUS

## 2021-08-13 MED ORDER — LIDOCAINE HCL (PF) 1 % IJ SOLN: 5.0000 mL | INTRAMUSCULAR | Status: DC | PRN

## 2021-08-13 MED ORDER — CHLORHEXIDINE GLUCONATE 0.12 % MT SOLN
15.0000 mL | Freq: Two times a day (BID) | OROMUCOSAL | Status: DC
  Administered 2021-08-13: 15 mL via OROMUCOSAL
  Filled 2021-08-13: qty 15

## 2021-08-13 MED ORDER — ONDANSETRON HCL 4 MG/2ML IJ SOLN
4.0000 mg | Freq: Once | INTRAMUSCULAR | Status: AC
  Administered 2021-08-13: 4 mg via INTRAVENOUS
  Filled 2021-08-13: qty 2

## 2021-08-13 MED ORDER — ACETAMINOPHEN 650 MG RE SUPP
650.0000 mg | RECTAL | Status: DC | PRN
  Administered 2021-08-14: 650 mg via RECTAL
  Filled 2021-08-13: qty 1

## 2021-08-13 MED ORDER — PENTAFLUOROPROP-TETRAFLUOROETH EX AERO: 1.0000 "application " | INHALATION_SPRAY | CUTANEOUS | Status: DC | PRN

## 2021-08-13 MED ORDER — ALTEPLASE 2 MG IJ SOLR: 2.0000 mg | Freq: Once | INTRAMUSCULAR | Status: DC | PRN

## 2021-08-13 MED ORDER — LIDOCAINE-PRILOCAINE 2.5-2.5 % EX CREA: 1.0000 "application " | TOPICAL_CREAM | CUTANEOUS | Status: DC | PRN

## 2021-08-13 MED ORDER — ACETAMINOPHEN 650 MG RE SUPP
325.0000 mg | Freq: Four times a day (QID) | RECTAL | Status: DC | PRN
  Administered 2021-08-13: 325 mg via RECTAL
  Filled 2021-08-13: qty 1
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
## 2021-08-13 NOTE — Progress Notes (Signed)
FPTS -  Holley Bouche  I called the office of Dr. Benson Norway [phone]) around 12:25 pm, and left a message for him to be paged about a STAT consult for bloody vomiting and bowel movements. I received a call from Dr. Benson Norway around 1:09 pm and spoke with him about patient Euell Schiff. Dr. Benson Norway, GI specialist to Mr. Lilia Pro. Said he'd spoke with nursing and looked through the chart and was aware that Mr. Ensminger has had episodes of hematemesis. He states that he's too concerned about the altered mental status of the patient, at this time. He worries that he may not wake up from anesthesia. At this time he agrees with the treatments we have started, PPI and lactulose. Dr. Benson Norway also notes that the patient probably has Shock Liver, given his BP on arrival compared to today, the lactate @ 6.6, with the transaminitis. He states that this was probably due to him being relatively hypotensive and it caused his AST/ALT levels to rise. At this time, patient has had two episodes of hematemesis and is hemodynamically stable.  Dr. Benson Norway encouraged [place] to call if: -He has another episode of hematemesis, melena, or hematochezia -He becomes hypotensive, non-responsive to fluids -We have any GI questions  Holley Bouche, MD PGY-1 Family Medicine

## 2021-08-13 NOTE — Progress Notes (Signed)
[facility] Progress Note   Subjective: had episode of bloody emesis and stools this AM. HGB down to 7.3. S/P 1 unit PRBCS. Per primary HD later today on schedule. Hold heparin.      Objective Vitals:   08/13/21 0937 08/13/21 1000 08/13/21 1110 08/13/21 1135  BP: (!) 151/105 (!) 136/92 (!) 165/113 (!) 151/105  Pulse: 95 93 97 94  Resp: 17 18 16 18   Temp: 97.9 F (36.6 C)  98 F (36.7 C) 97.9 F (36.6 C)  TempSrc: Axillary  Oral Oral  SpO2: 99% 99% 100% 92%  Weight:      Height:       Physical Exam General: Chronically ill appearing elderly male in NAD Neuro: Oriented to self only  Heart: S1,S2 RRR No M/R/G Lungs: CTAB A/P Abdomen: NABS, NT, incontinent of stool Extremities: No LE edema Dialysis Access: R femoral TDC Drsg intact    Additional Objective Labs: Basic Metabolic Panel: Recent Labs  Lab 08/08/21 0526 08/08/21 1823 08/08/21 1844 08/11/21 1027 08/12/21 0205 08/13/21 0613  NA 136 135   < > 129* 133* 131*  K 5.6* 5.4*   < > 3.9 4.3 5.8*  CL 92* 93*   < > 89* 97* 93*  CO2 21* 20*   < > 23 25 18*  GLUCOSE 124* 67*   < > 136* 108* 121*  BUN 72* 77*   < > 54* 26* 57*  CREATININE 13.27* 12.89*   < > 10.58* 6.88* 8.86*  CALCIUM 9.0 8.7*   < > 8.9 8.7* 8.8*  PHOS 9.1* 8.0*  --   --   --  5.9*   < > = values in this interval not displayed.   Liver Function Tests: Recent Labs  Lab 08/11/21 0129 08/12/21 0205 08/13/21 0613 08/13/21 0923  AST 278* 183*  --  104*  ALT 603* 483*  --  340*  ALKPHOS 109 111  --  93  BILITOT 0.8 0.9  --  1.1  PROT 5.8* 6.5  --  6.8  ALBUMIN 2.7* 3.0* 3.0* 3.0*   Recent Labs  Lab 07/30/2021 1645  LIPASE 47   CBC: Recent Labs  Lab 07/23/2021 1645 08/08/21 0526 08/09/21 0215 08/10/21 0140 08/11/21 0129 08/12/21 0205 08/13/21 0613  WBC 19.6*   < > 13.2* 12.8* 11.2* 11.3* 12.1*  NEUTROABS 16.4*  --   --   --   --   --  8.9*  HGB 10.5*   < > 9.6* 9.2* 8.5* 8.7* 7.3*  HCT 33.1*   < > 28.9* 26.8* 25.4*  26.4* 23.2*  MCV 96.5   < > 94.1 91.5 92.7 93.0 95.5  PLT 190   < > 156 159 166 144* 137*   < > = values in this interval not displayed.   Blood Culture    Component Value Date/Time   SDES BLOOD RIGHT HAND 08/08/2021 0651   SPECREQUEST  08/08/2021 9470    BOTTLES DRAWN AEROBIC AND ANAEROBIC Blood Culture results may not be optimal due to an inadequate volume of blood received in culture bottles   CULT  08/08/2021 0651    NO GROWTH 5 DAYS Performed at [facility], [place] [address], [place], [place] [address]    REPTSTATUS 08/13/2021 FINAL 08/08/2021 0651    Cardiac Enzymes: Recent Labs  Lab 08/08/21 0827  CKTOTAL 225   CBG: Recent Labs  Lab 08/12/21 1318 08/12/21 1743 08/12/21 2105 08/13/21 0755 08/13/21 1112  GLUCAP 93 117* 138* 127*  130*   Iron Studies: No results for input(s): IRON, TIBC, TRANSFERRIN, FERRITIN in the last 72 hours. @lablastinr3 @ Studies/Results: CT ABDOMEN PELVIS WO CONTRAST  Result Date: 08/13/2021 CLINICAL DATA:  80 year old male with abdominal pain, GI bleed. End stage renal disease on dialysis. EXAM: CT ABDOMEN AND PELVIS WITHOUT CONTRAST TECHNIQUE: Multidetector CT imaging of the abdomen and pelvis was performed following the standard protocol without IV contrast. COMPARISON:  CTA abdomen and Pelvis 08/11/2021 with contrast. FINDINGS: Lower chest: Improved lung volumes and lung base ventilation. Dual lumen dialysis type catheter partially visible within the heart. No pericardial or pleural effusion. No cardiomegaly. Hepatobiliary: Contracted gallbladder. Trace perihepatic free fluid with simple fluid density. Negative noncontrast liver. Pancreas: Negative noncontrast pancreas. Spleen: Diminutive, negative. Adrenals/Urinary Tract: Stable mild adrenal gland thickening such as due to hyperplasia. Stable renal atrophy. No hydronephrosis. Diminutive, thick-walled bladder containing a small volume of excreted IV contrast. Stomach/Bowel: Mildly redundant  large bowel. Diverticula in the distal descending and proximal sigmoid colon. Mild motion artifact intermittently in the abdomen and pelvis. Occasional right colon diverticula. No convincing large bowel inflammation. No dilated small bowel. Possible small gastric hiatal hernia. Small volume of fluid within the stomach. There are at least 2 duodenal diverticula, in the 2nd and 3rd segments. There is layering contrast or less likely calcification in the more proximal diverticulum. No active inflammation. Otherwise the duodenum is within normal limits. No free air identified. Vascular/Lymphatic: Right femoral approach dialysis catheter appears unchanged. No lymphadenopathy. Normal caliber abdominal aorta. Reproductive: Negative. Other: Trace presacral fluid or stranding has decreased. Musculoskeletal: Chronic lower lumbar spine degeneration. Chronic SI joint ankylosis. No acute osseous abnormality identified. IMPRESSION: 1. Trace free fluid, nonspecific. But no other acute or focal inflammatory process identified in the non-contrast abdomen or pelvis. 2. Diverticulosis of the duodenum and also the large bowel without active inflammation. Chronic renal atrophy, thick-walled bladder. Right femoral approach dialysis catheter. Aortic Atherosclerosis (ICD10-I70.0). Electronically Signed   By: Genevie Ann M.D.   On: 08/13/2021 06:00   DG Chest 2 View  Result Date: 08/11/2021 CLINICAL DATA:  Cough, weakness and pain. EXAM: CHEST - 2 VIEW COMPARISON:  08/08/2021. FINDINGS: Trachea is midline. Heart size stable. Thoracic aorta is calcified. Pacemaker lead tips are in the right atrium and right ventricle. IVC is approach catheter terminates in the right atrium, near the tricuspid valve. Lungs are low in volume but clear. No pleural fluid. A stent is seen in the right axillary region. IMPRESSION: Low lung volumes without acute finding. Electronically Signed   By: Lorin Picket M.D.   On: 08/11/2021 14:18   CT HEAD WO  CONTRAST (5MM)  Result Date: 08/12/2021 CLINICAL DATA:  Altered mental status EXAM: CT HEAD WITHOUT CONTRAST TECHNIQUE: Contiguous axial images were obtained from the base of the skull through the vertex without intravenous contrast. COMPARISON:  03/26/2021 FINDINGS: Brain: There is no mass, hemorrhage or extra-axial collection. There is generalized atrophy without lobar predilection. Hypodensity of the white matter is most commonly associated with chronic microvascular disease. Vascular: No abnormal hyperdensity of the major intracranial arteries or dural venous sinuses. No intracranial atherosclerosis. Skull: Remote right-sided craniotomy. Sinuses/Orbits: No fluid levels or advanced mucosal thickening of the visualized paranasal sinuses. No mastoid or middle ear effusion. The orbits are normal. IMPRESSION: Chronic microvascular ischemia and generalized atrophy without acute intracranial abnormality. Cerebral Atrophy (ICD10-G31.9). Electronically Signed   By: Ulyses Jarred M.D.   On: 08/12/2021 00:31   Medications:  sodium chloride     sodium  chloride     ferric gluconate (FERRLECIT) IVPB      Chlorhexidine Gluconate Cloth  6 each Topical Q0600   darbepoetin (ARANESP) injection - DIALYSIS  25 mcg Intravenous Q Mon-HD   feeding supplement (NEPRO CARB STEADY)  237 mL Oral BID BM   lactulose  10 g Oral Daily   lidocaine  1 patch Transdermal Q24H   pantoprazole (PROTONIX) IV  40 mg Intravenous Q12H   polyethylene glycol  17 g Oral BID   senna  1 tablet Oral BID   sevelamer carbonate  800 mg Oral TID WC     Dialysis Orders: Center: [facility]  on MWF. 180NRe, 4 hours, BFR 400, DFR Auto 1.5, EDW 68.5kg, 2K, 2 Ca, TDC Heparin 4000 unit bolus Venofer 100mg  IV q HD 10/19-11/9 Calcitriol 2.51mcg PO q HD   Problem/Plan: New GIB-per primary. S/P 1 units PRBCs this AM. Hold heparin. GI not consulted yet.  TDC malfunction: Dialysis center unable to access. IR consulted. S/P ight femoral TDC exchange with IVC  venoplasty 08/08/2021 ESRD:  MWF. Next HD 08/13/2021 Hold heparin  Nausea/elevated LFTs: LFTs signficantly elevated, now improving RUQ US unremarkable. Statin on hold. nausea/multifactorial. Per primary. Hypertension/volume: No evidence of overt volume overload but BP not controlled. Net UF 1 Liter 08/11/2021. Still above OP EDW. Increased Metoprolol to 25 mcg PO BID per OP orders. Attempt to lower EDW if tolerated in HD.   Anemia/ABLA: HGB 8.7 down to 7.3 after bloody stool/emesis this AM. S/P 1 unit PRBCs.  Rec'd low dose ESA 08/11/21. Continue Fe load.   Metabolic bone disease: Calcium controlled. Phos 8.0. on  10/28 continue renvela, recheck labs predialysis  Ayen Viviano H. Stepheni Cameron NP-C 08/13/2021, 1:03 PM  [employer] [phone]

## 2021-08-13 NOTE — Progress Notes (Signed)
   08/13/21 1700  Assess: MEWS Score  Temp 98.5 F (36.9 C)  BP 129/72  Pulse Rate (!) 113  ECG Heart Rate (!) 116  Resp 18  Level of Consciousness Responds to Voice  SpO2 100 %  O2 Device Room Air  Assess: MEWS Score  MEWS Temp 0  MEWS Systolic 0  MEWS Pulse 2  MEWS RR 0  MEWS LOC 1  MEWS Score 3  MEWS Score Color Yellow  Assess: if the MEWS score is Yellow or Red  Were vital signs taken at a resting state? Yes  Focused Assessment No change from prior assessment  Early Detection of Sepsis Score *See Row Information* Medium  MEWS guidelines implemented *See Row Information* Yes  Treat  Pain Scale PAINAD  Breathing 2  Negative Vocalization 1  Facial Expression 0  Body Language 0  Consolability 1  PAINAD Score 4  Take Vital Signs  Increase Vital Sign Frequency  Yellow: Q 2hr X 2 then Q 4hr X 2, if remains yellow, continue Q 4hrs  Escalate  MEWS: Escalate Yellow: discuss with charge nurse/RN and consider discussing with provider and RRT  Notify: Charge Nurse/RN  Name of Charge Nurse/RN Notified Desiray RN  Date Charge Nurse/RN Notified 08/13/21  Time Charge Nurse/RN Notified 1219  Document  Progress note created (see row info) Yes    Patient back from dialysis. Responds to voice but will not answer questions. Intermittent groining noted by pt. Sister at bedside. Resident notified of yellow mews. Will continue to monitor.

## 2021-08-13 NOTE — Progress Notes (Signed)
Bedside stool hemoccult positive.

## 2021-08-13 NOTE — Progress Notes (Signed)
Date and time results received: 08/13/21 2144 (use smartphrase ".now" to insert current time)  Test: Troponin Critical Value: 860  Name of Provider Notified: Dameron, DO via text page at 2156.  Orders Received? Or Actions Taken?: Orders Received - See Orders for details  EKG done. DO at bedside.

## 2021-08-13 NOTE — Progress Notes (Signed)
FPTS Interim Progress Note  S:Received page from RN that patient had "vomited and pooped LOTS of blood." Immediately went to bedside with Dr. Jeani Hawking and saw patient laying in bed with gown removed with multiple staff members cleaning him up. There were numerous gelatinous blood clots and blood soaked sheets with large volume of stool.  Patient was hemodynamically stable and did not appear unchanged from baseline prior today. He was grunting and able to answer simple questions with "yes or no" but unable to further converse.   O: BP 125/86 (BP Location: Left Wrist)   Pulse 99   Temp (!) 97.2 F (36.2 C) (Axillary)   Resp 17   Ht 5\' 7"  (1.702 m)   Wt 70 kg   SpO2 100%   BMI 24.17 kg/m   General: Elderly male, curled up in bed with staff cleaning him up Abdomen: Moderately distended, tense, groaned in pain to palpation in all quadrants. Unclear rebound tenderness 2/2 mentation Resp: Clear in all lung fields, normal work of breathing, SpO2 94%  A/P: Potential GI bleed Given large volume of blood and loss of bowel control, it is likely patient is having GI bleed. He is currently hemodynamically stable without hypotension or tachycardia. He remains awake and alert. Mentation largely unchanged from earlier today. Re-checked orders to ensure patient is not on any blood thinners that could worsen bleed. - Discontinued Heparin subq and ASA - Start IV Protonix 40 mg; discontinue famotidine - stat CBC - stat PT/INR - stat CT abdomen/pelvis w/o contrast - Monitor patient closely until labs/imaging return  Orvis Brill, DO 08/13/2021, 4:42 AM PGY-1, [place] pager [phone]

## 2021-08-13 NOTE — Progress Notes (Signed)
While in the room, patient had large black/tarry loose stool. Will consult GI.  Orvis Brill, DO

## 2021-08-13 NOTE — Progress Notes (Addendum)
Received page from RN for critical lab value of Troponin 860, which was previously 25 on 10/31. Went to bedside to evaluate patient who was groaning in pain (unchanged from earlier in the day), and is now diaphoretic with weaker distal pulses, cooler extremities and decreased blood pressure.  VS: BP 113/65 MAP 77, SpO2 100% ORA,  HR 110s, RR 29  A/P Concern is high for acute cardiac event. Remains hemodynamically stable. Will monitor VS closely. Awaiting repeat troponin and EKG. - STAT EKG ordered- showed paced-rhythm  - Pending repeat troponin- patient is extraordinarily tough stick, will consult with nephrology regarding midline placement -250cc bolus running - Cardiology consulted (Dr. Doylene Canard) who will evaluate the patient this evening. Greatly appreciate his care.  Orvis Brill, [place] Intern pager [phone]

## 2021-08-13 NOTE — Progress Notes (Signed)
Spoke with GI about black tarry stool and cardiovascular symptoms of elevated trops, softening BP, and diaphoresis. GI Dr. Benson Norway recommends STAT CTA abd/pelv. Ordered now.   Ezequiel Essex, MD

## 2021-08-13 NOTE — Progress Notes (Signed)
PT Cancellation Note  Patient Details Name: William [name] MRN: [id number] DOB: [birth year]   Cancelled Treatment:    Reason Eval/Treat Not Completed: Medical issues which prohibited therapy;Fatigue/lethargy limiting ability to participate. Pt lethargic, minimally responsive to PT or sister upon arrival, eyes closed. PT will hold at this time as Hgb is also low at 7.3 and pt with recent episodes of hematemesis this morning. PT will follow up as time allows.   Zenaida Niece 08/13/2021, 2:08 PM

## 2021-08-13 NOTE — Progress Notes (Signed)
Family Medicine Teaching Service Daily Progress Note Intern Pager: [phone]  Patient name: William [name] Medical record number: [id number] Date of birth: 25-Oct-1940 Age: 80 y.o. Gender: male  Primary Care Provider: Rise Patience, DO Consultants: Nephrology, IR Code Status: Full  Pt Overview and Major Events to Date:  10/28: admitted to [facility], right fem HD cath exchanged  Assessment and Plan:  Patient is an 80 year old male who presented with Vas-Cath failure with elevated LFTs.  PMH significant for ESRD on MWF HD, CAD, HTN, T2DM, Hx multiple falls, subdural hematoma, nonsustained V. tach s/p pacemaker.   Acute abdominal pain  concomitant encephalopathy  Hematemesis & Melena Patient had bloody emesis and bloody bowel movement over night, with another episode of bloody emesis and a tarry stool. He was hemodynamically stable for both with slightly decreased mental status from yesterday, grunting with 1 word answers. Hgb dropped from 8.7-7.3, patient was given I unit PRBC for Hgb goal above 8. Pt and INR both elevated at 17.3 and 1.4 respectively, heparin and Asprin held. GI consulted and preferred not to scope patient, because of altered mental status. K was 5.8 with tele demonstrating his normal rhythm. Patient BP 140-160's/100's.  -BMP: K 5.8 (4.3, 3.9), Na 131 (133, 129), CO2 18, Anion GAP 20 -CBC: WBC 12.1 (11.3,11.2),  -Continue Lactulose 10 g daily -Continue Metoprolol 25 PO BID -PT/OT -Continue Pepcid for hiccups  Elevated LFT's AST and ALT this morning were 104 / 340 respectively. -Continue to hold statin -Daily INR -Daily CMP to trend LFTs  ESRD on MWF HD Hg 8.1 today, s/p I unit PRBC. Scheduled for dialysis today, but HD team will not be pulling fluid off, because of patients lack of responsiveness.   DM2 Blood sugars 120's-130's today.  Continue to monitor CBGs Encourage p.o. intake Feeding supplement 3 times daily  CAD, NSVT s/p pacemaker Continue home  metoprolol 12.5 mg twice daily  Hyperlipidemia Continue to hold statin  FEN/GI: renal and heart healthy diet PPx: Holding heparin Dispo: TBD    Subjective:  Patient sleeping in bed, and minimally responsive. Patient only responding with one word answerers and grunts.   Objective: Temp:  [97.2 F (36.2 C)-98.6 F (37 C)] 97.9 F (36.6 C) (11/02 0758) Pulse Rate:  [77-101] 92 (11/02 0810) Resp:  [16-20] 18 (11/02 0810) BP: (125-182)/(86-106) 153/98 (11/02 0810) SpO2:  [85 %-100 %] 99 % (11/02 0810) Physical Exam: General: Frail, ill appearing, no acute signs of distress, African American male  Cardiovascular: Could not auscultate heart sounds, 2/2 patient sturdor.  Respiratory: CTABL, no wheezes, or crackles, prominent sturdor  Abdomen: Soft, non-tender, non-distended Extremities: Pulses intact in upper extremities  Laboratory: Recent Labs  Lab 08/11/21 0129 08/12/21 0205 08/13/21 0613  WBC 11.2* 11.3* 12.1*  HGB 8.5* 8.7* 7.3*  HCT 25.4* 26.4* 23.2*  PLT 166 144* 137*   Recent Labs  Lab 08/10/21 0140 08/11/21 0129 08/11/21 1027 08/12/21 0205 08/13/21 0613  NA 130* 128* 129* 133* 131*  K 3.7 3.9 3.9 4.3 5.8*  CL 93* 91* 89* 97* 93*  CO2 24 22 23 25  18*  BUN 37* 49* 54* 26* 57*  CREATININE 7.88* 9.89* 10.58* 6.88* 8.86*  CALCIUM 9.0 8.8* 8.9 8.7* 8.8*  PROT 6.4* 5.8*  --  6.5  --   BILITOT 0.8 0.8  --  0.9  --   ALKPHOS 123 109  --  111  --   ALT 831* 603*  --  483*  --   AST 558* 278*  --  183*  --   GLUCOSE 148* 132* 136* 108* 121*     Imaging/Diagnostic Tests:   Holley Bouche, MD 08/13/2021, 8:23 AM PGY-1, [place] Intern pager: [phone], text pages welcome

## 2021-08-13 NOTE — Progress Notes (Signed)
FPTS Interim Progress Note  S:Patient sleeping soundly in bed. RN reports patient says he has pain but cannot specify. RN requests pain meds PRN.   O: BP 125/86 (BP Location: Left Wrist)   Pulse 99   Temp (!) 97.2 F (36.2 C) (Axillary)   Resp 17   Ht 5\' 7"  (1.702 m)   Wt 70 kg   SpO2 100%   BMI 24.17 kg/m    A/P: Encephalopathy Normotensive overnight with BP 125/86. Continue with plan per daily progress note.    ESRD on MWF HD Will collect renal function panel this morning prior to HD. RN reports patient says he has pain but cannot specify. We cannot use NSAIDs such as toradol in patients with ESRD. Care must be taken to avoid over-sedation with pain meds in context of encephalopathy.  - tylenol q6 PRN first line - may consider hydromorphone or fentanyl if pain not responding to tylenol   DM2 QHS CBG 138. No insulin required at this time.   Ezequiel Essex, MD 08/13/2021, 2:49 AM PGY-2, [facility] pager [phone]

## 2021-08-13 NOTE — Progress Notes (Signed)
Called patient's daughter with update.   Diallo, Ponder (Daughter)  [phone] (Home Phone)   Ezequiel Essex, MD

## 2021-08-13 NOTE — Progress Notes (Signed)
Hemoglobin 7.3, below threshold of 8 due to CAD and PAD history. Ordered one unit pRBC to be administered. Type and screen pending. RN to place f/u H&H at appropriate time.   Ezequiel Essex, MD

## 2021-08-13 NOTE — Progress Notes (Signed)
During rounding, pt was found to have extremely large emesis episode of blood and clots & bloody stool. Pt was unable to use call bell to request assistance. VS obtained; MD & rapid notified. Pt bathed, gown and full linen changed completed. Safety measures maintained. Will continue to monitor and intervene PRN per provider's orders.
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
## 2021-08-13 NOTE — Progress Notes (Signed)
Reported to bedside with Dr. Marcina Millard after notification of patient having episode of hematemesis.   On PE, patient has eyes closed, grunting and not responsive verbally.  Abdomen is slight firm, apparent tenderness diffusely Neuro: patient withdraws to noxious stimuli, does not follow commands   Today's Vitals   08/13/21 0758 08/13/21 0810 08/13/21 0900 08/13/21 0912  BP: (!) 182/101 (!) 153/98 (!) 150/89 (!) 150/91  Pulse: 90 92 99 94  Resp: 16 18 20 20   Temp: 97.9 F (36.6 C)   98.3 F (36.8 C)  TempSrc: Oral   Oral  SpO2: 99% 99% 99% 99%  Weight:      Height:      PainSc:       Body mass index is 24.17 kg/m.  Last CBC with hgb below transfusion threshold of 8, awaiting 1 unit of pRBCs to transfuse  Will consult GI for further evaluation for hematemesis and hematochezia  Will order stat LFT   Eulis Foster, MD  [place], PGY-3  [facility] Intern Pager [phone]

## 2021-08-14 ENCOUNTER — Inpatient Hospital Stay (HOSPITAL_COMMUNITY): Payer: Medicare PPO

## 2021-08-14 LAB — GLUCOSE, CAPILLARY
Glucose-Capillary: 115 mg/dL — ABNORMAL HIGH (ref 70–99)
Glucose-Capillary: 169 mg/dL — ABNORMAL HIGH (ref 70–99)
Glucose-Capillary: 23 mg/dL — CL (ref 70–99)

## 2021-08-14 LAB — TYPE AND SCREEN
ABO/RH(D): A POS
Antibody Screen: NEGATIVE
Unit division: 0
Unit division: 0

## 2021-08-14 LAB — GASTROINTESTINAL PANEL BY PCR, STOOL (REPLACES STOOL CULTURE)

## 2021-08-14 LAB — BPAM RBC
Blood Product Expiration Date: 202211072359
Blood Product Expiration Date: 202211212359
ISSUE DATE / TIME: 202211020910
ISSUE DATE / TIME: 202211022344
Unit Type and Rh: 6200
Unit Type and Rh: 6200

## 2021-08-14 LAB — TROPONIN I (HIGH SENSITIVITY): Troponin I (High Sensitivity): 946 ng/L (ref <18)

## 2021-08-14 MED ORDER — MORPHINE SULFATE (PF) 2 MG/ML IV SOLN
1.0000 mg | INTRAVENOUS | Status: DC | PRN
  Administered 2021-08-14: 1 mg via INTRAVENOUS
  Filled 2021-08-14: qty 1

## 2021-08-14 MED ORDER — IOHEXOL 350 MG/ML SOLN
75.0000 mL | Freq: Once | INTRAVENOUS | Status: AC | PRN
  Administered 2021-08-14: 75 mL via INTRAVENOUS

## 2021-08-21 ENCOUNTER — Ambulatory Visit: Payer: Medicare PPO | Admitting: Sports Medicine

## 2021-08-21 LAB — GLUCOSE, CAPILLARY: Glucose-Capillary: 20 mg/dL — CL (ref 70–99)

## 2021-08-26 ENCOUNTER — Other Ambulatory Visit: Payer: Medicare PPO | Admitting: Nurse Practitioner

## 2021-09-11 NOTE — Progress Notes (Signed)
Hypoglycemic Event  CBG: 0024 CBG: 20, Recheck 0025 CBG: 23  Treatment: D50 50 mL (25 gm)  Symptoms: Pale and Sweaty  Follow-up CBG: Time:  0049 CBG Result: 169   Possible Reasons for Event: Inadequate meal intake  Comments/MD notified:  Dameron, DO aware and at bedside when CBG 63 at 2339. D50 40ml given. CBG recheck delayed d/t RN and DO attempting to get BP, starting PRBC transfusion,IV team attempting to get new IV access, etc.   Dameron, DO aware and at bedside when CBG 20. D50 60ml given.    Sylvie Farrier

## 2021-09-11 NOTE — Progress Notes (Signed)
   08/13/21 2351  Assess: MEWS Score  Temp (!) 95.9 F (35.5 C)  BP 128/63  ECG Heart Rate (!) 110  Resp (!) 31  Level of Consciousness Responds to Voice  SpO2 100 %  O2 Device Nasal Cannula  Patient Activity (if Appropriate) In bed  O2 Flow Rate (L/min) 2 L/min  Assess: MEWS Score  MEWS Temp 1  MEWS Systolic 0  MEWS Pulse 1  MEWS RR 2  MEWS LOC 1  MEWS Score 5  MEWS Score Color Red  Assess: if the MEWS score is Yellow or Red  Were vital signs taken at a resting state? Yes  Focused Assessment Change from prior assessment (see assessment flowsheet)  Early Detection of Sepsis Score *See Row Information* High  MEWS guidelines implemented *See Row Information* Yes  Treat  MEWS Interventions Escalated (See documentation below)  Take Vital Signs  Increase Vital Sign Frequency  Red: Q 1hr X 4 then Q 4hr X 4, if remains red, continue Q 4hrs  Escalate  MEWS: Escalate Red: discuss with charge nurse/RN and provider, consider discussing with RRT  Notify: Charge Nurse/RN  Name of Charge Nurse/RN Notified April, RN  Date Charge Nurse/RN Notified 08/13/21  Time Charge Nurse/RN Notified 2351  Notify: Provider  Provider Name/Title Dameron, DO  Date Provider Notified 08/13/21  Time Provider Notified 2351  Notification Type Face-to-face  Notification Reason Change in status  Provider response At bedside  Date of Provider Response 08/13/21  Time of Provider Response 2351  Document  Patient Outcome Not stable and remains on department (Patient made a DNR.)   Dameron, DO at bedside for multiple hours during patient's status decline. O2 2L [place] on for patient comfort. Tylenol suppository given for patient comfort. Heating blanket in place. Patient continues to having moaning and restlessness at times. DO aware that patient's lung sounds slightly rhonchous before PRBC transfusion. EKG done. CXR done. CTA done. Will continue to monitor.

## 2021-09-11 NOTE — Death Summary Note (Signed)
[facility] Death Summary  Patient name: William [name] Medical record number: [id number] Date of birth: November 28, 1940 Age: 80 y.o. Gender: male Date of Admission: 08-11-21  Date of Death: 2021/08/18 Admitting Physician: Lyndee Hensen, DO  Primary Care Provider: Rise Patience, DO Consultants: GI, Cardiology, Nephrology  Indication for Hospitalization: Vas-cath failure  Discharge Diagnoses/Problem List:  Principal Problem:   Complication associated with dialysis catheter Active Problems:   Elevated liver enzymes   Disposition: Death  Brief Hospital Course:  [name] is an 80 y.o. male who presented with vascath failure in setting of sepsis and elevated LFTs. PMH is significant for ESRD on HD MWF, CAD s/p PCI, HTN, T2DM, hx multiple falls, subdural hematoma, nonsustained v-tach s/p pacemaker, and sarcoidosis.  IR was consulted and they successfully placed a new vas-cath and performed an IVC venoplasty. Nephrology was consulted and he received HD. Patient had hematochezia and hematemesis over the course of 2 days requiring 2u pRBC. On 11/2, he developed worsening pain with groaning and had persistent black, tarry stools. Cardiology was consulted and he was found to have NSTEMI. GI was consulted. He was not a candidate for cardiac intervention or endoscopy with GI. He was hypothermic to 95 with bair hugger applied, low CBG in 20s appropriately treated with D50, and anemic. Respirations decreased and patient died on August 19, 2023.  Significant Procedures: HD  Orvis Brill, DO 08/18/21, 7:20 AM PGY-1, [place]

## 2021-09-11 NOTE — Progress Notes (Signed)
Came to check on patient during nighttime rounds and he was having slow, diminished respirations that ceased as soon I arrived to bedside. Confirmed that patient was apneic, without pulse that was confirmed with RN.  Contacted daughter [name] to get to hospital. Unable to get into contact with other two daughters.  Death note to follow.  Orvis Brill, [place]

## 2021-09-11 NOTE — Progress Notes (Signed)
At [facility], this RN and Claiborne Billings, NT entered room. Patient unresponsive, HR 60s, respirations in the 10 range and agonal. This RN called daughter, [name], to notify her of a change and suggest family comes as soon as possible. This RN secure Deanna Artis, MD to notify her of change. Dameron, DO in room with patient. Respirations ceased, then 0 HR per tele. This RN heart no heart sounds, verified by Dameron, DO.

## 2021-09-11 NOTE — Final Progress Note (Signed)
Called two daughters, both [place] and [place]. They are driving to the hospital at this time. They are each driving a vehicle. Both family medicine residents are waiting at the nurses station to relay the news of their father's death.   Ezequiel Essex, MD

## 2021-09-11 NOTE — Progress Notes (Addendum)
Consult for PIV/midline. 2.5" 20 PIV placed in L upper arm for CTA (forearm veins too small). MD informed [facility] cannot place midlines for dialysis patients without nephrology order. RN informed [facility] can place IO if additional access is needed vs central line placement by MD.

## 2021-09-11 NOTE — Progress Notes (Signed)
Attempted to call family regarding events and to clarify code status. Called [place] first - no answer, left [facility] safe VM. Next called Alfredo Bach who answered the phone and was able to include [place]. After much discussion, they relayed that their father had many times expressed his wish to be DNR status. Both daughters agreed to myself changing his CODE STATUS to DNR in the chart at this time.   [place], [place] Daughter [phone]   [place], [place] Daughter     [phone]   All questions answered. Daughters expressed thanks.   Ezequiel Essex, MD

## 2021-09-11 DEATH — deceased

## 2021-09-25 ENCOUNTER — Other Ambulatory Visit (HOSPITAL_COMMUNITY): Payer: Self-pay

## 2022-08-21 LAB — PROINSULIN/INSULIN RATIO
Insulin: 4.9 u[IU]/mL
Proinsulin: 6.8 pmol/L

## 2022-10-30 IMAGING — DX DG LUMBAR SPINE COMPLETE 4+V
5 series · 5 of 5 positions shown · non-contrast
Comparison: None.

CLINICAL DATA: Pain following recent fall

EXAM:
LUMBAR SPINE - COMPLETE 4+ VIEW

[t lumbar spine ap]
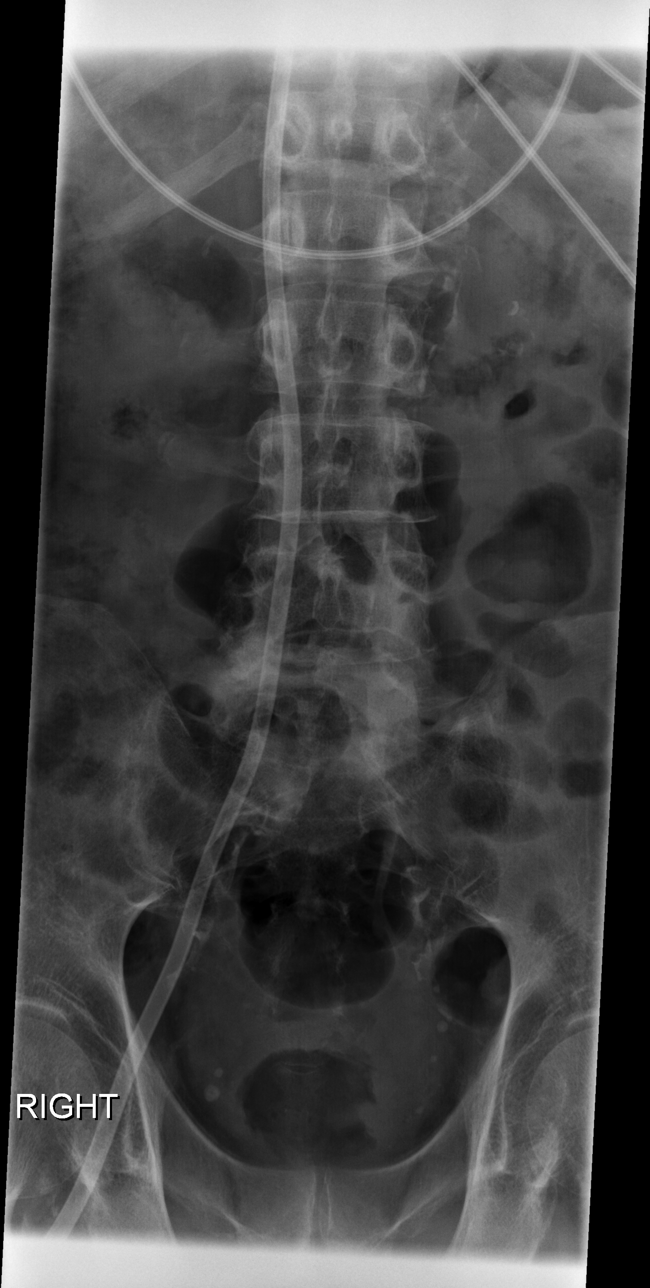

[t lumbar spine obl (1 of 2)]
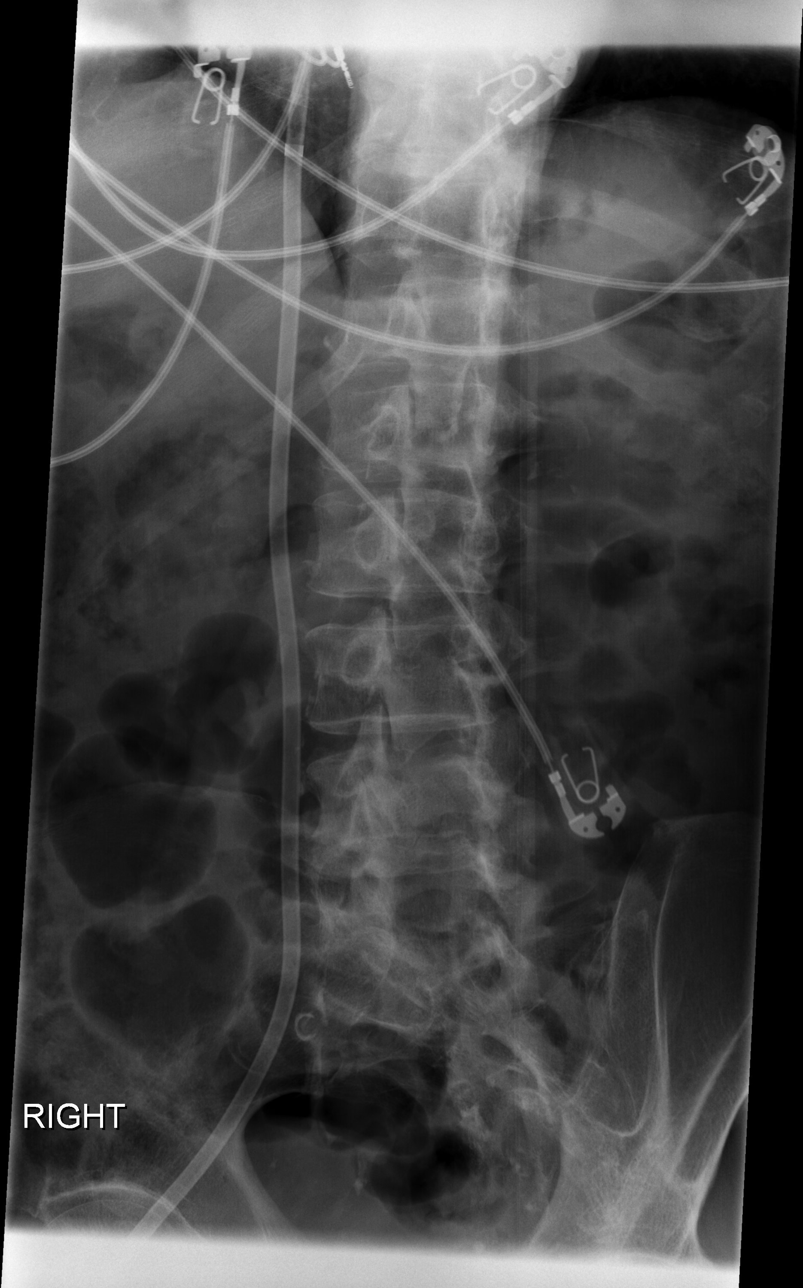

[t lumbar spine obl (2 of 2)]
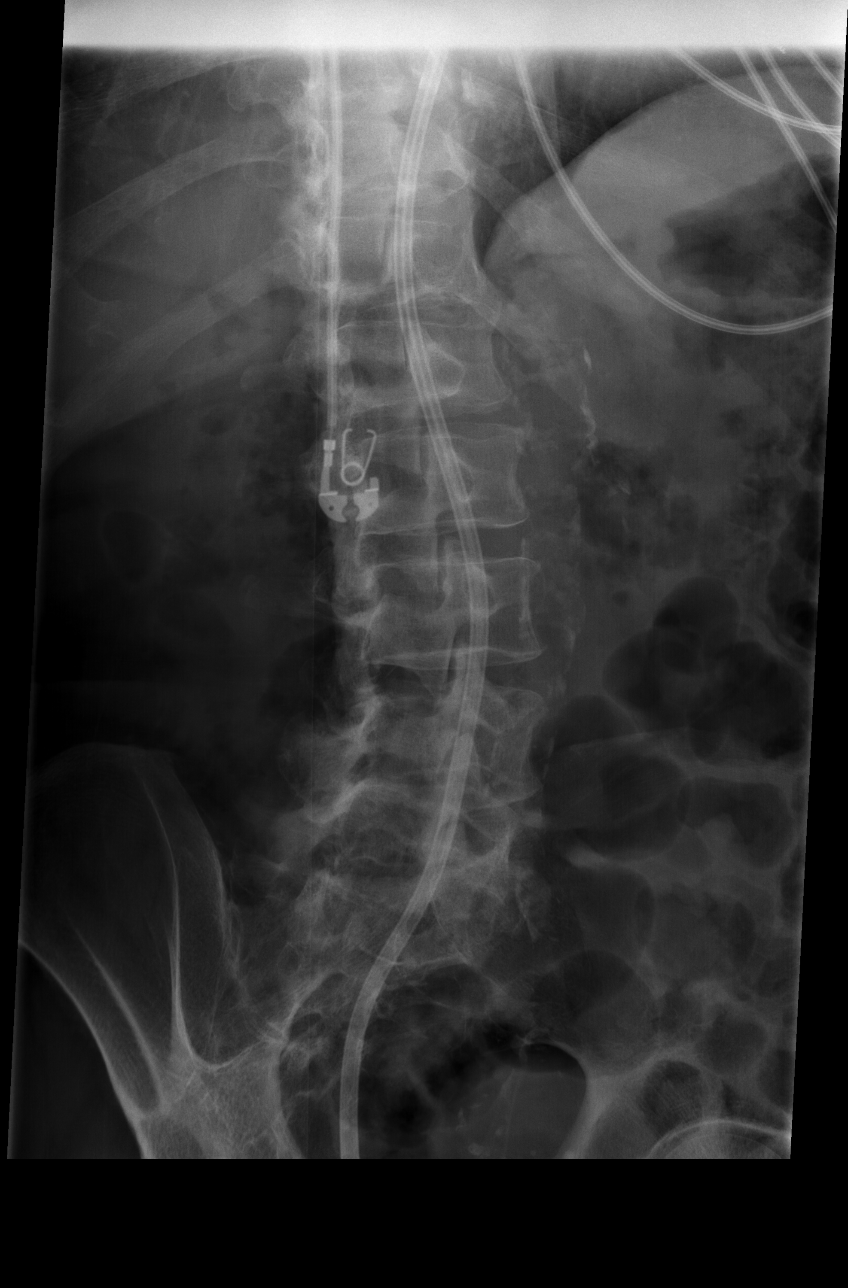

[t lumbar spine lat]
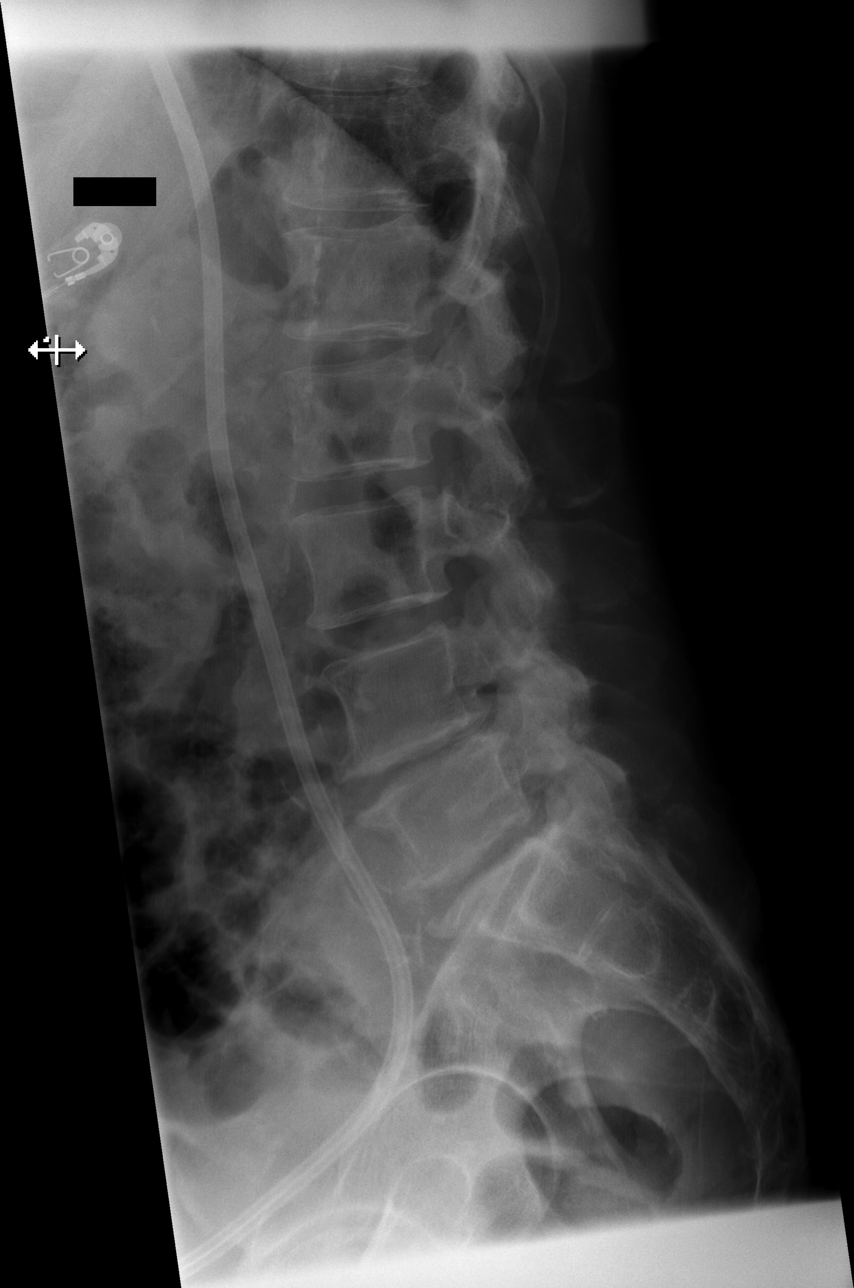

[t lumbar l-5 s-1 spot]
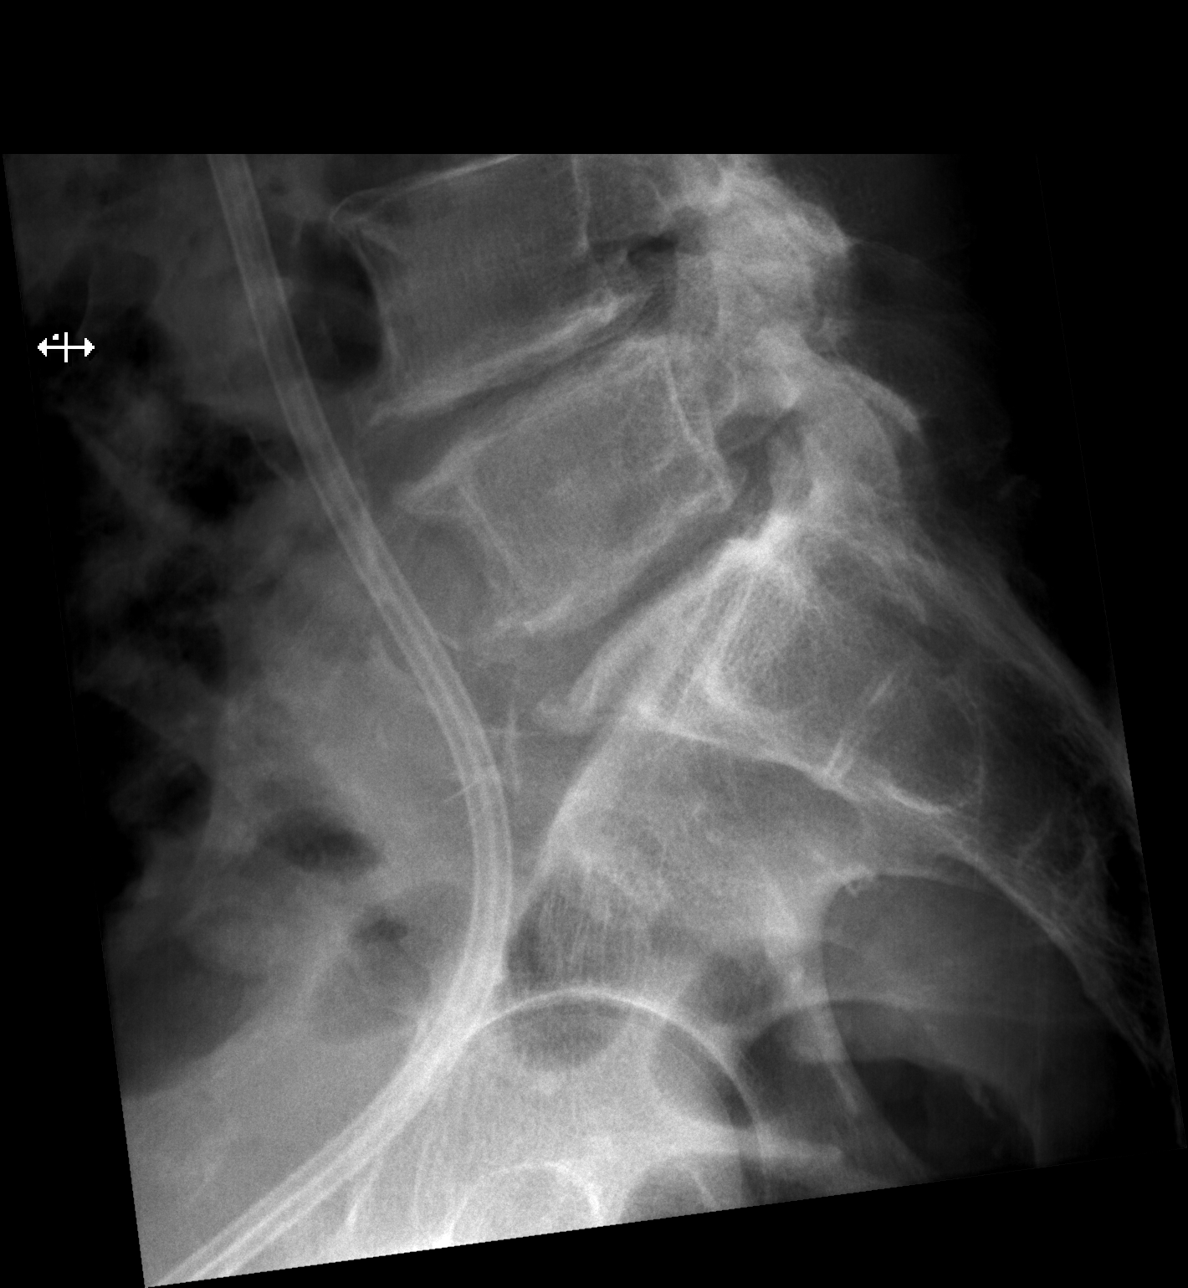

[5 of 5 positions shown; findings below may reference images not displayed]

FINDINGS: Frontal, lateral, spot lumbosacral lateral, and bilateral oblique
views were obtained. There are 5 non-rib-bearing lumbar type
vertebral bodies. There is no fracture or spondylolisthesis. There
is moderately severe disc space narrowing at L4-5 with moderate disc
space narrowing at L5-S1. Other disc spaces appear unremarkable.
There is facet osteoarthritic change at L4-5 and L5-S1 bilaterally.
There is aortic atherosclerosis.
IMPRESSION: Osteoarthritic change at L4-5 and L5-S1. No fracture or
spondylolisthesis.

Aortic Atherosclerosis ([id number]-[id number]).

## 2022-10-30 IMAGING — CT CT CERVICAL SPINE W/O CM
3 of 4 series · 13 of 33 positions shown, 16 images · non-contrast
Comparison: None.

CLINICAL DATA: Fall out of bed

EXAM:
CT HEAD WITHOUT CONTRAST
CT CERVICAL SPINE WITHOUT CONTRAST
TECHNIQUE: Multidetector CT imaging of the head and cervical spine was
performed following the standard protocol without intravenous
contrast. Multiplanar CT image reconstructions of the cervical spine
were also generated.

[Series 8: sag bone · sagittal · 0.28mm/px · 5 of 69 slices shown, 6 images]
[im 23/69  bone]
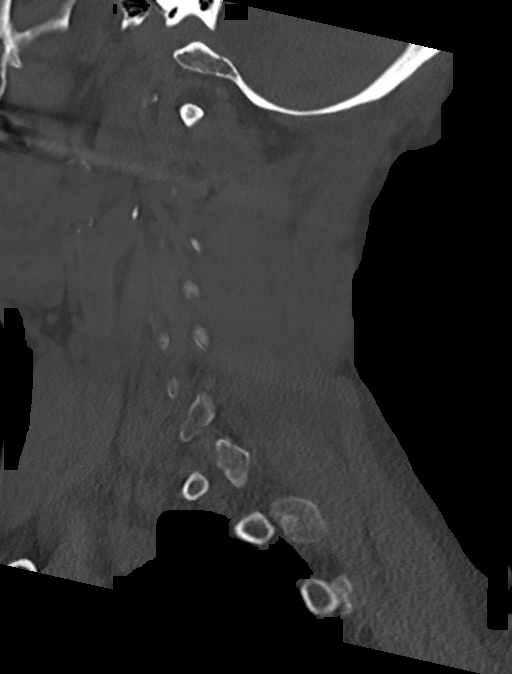
[im 29/69  bone]
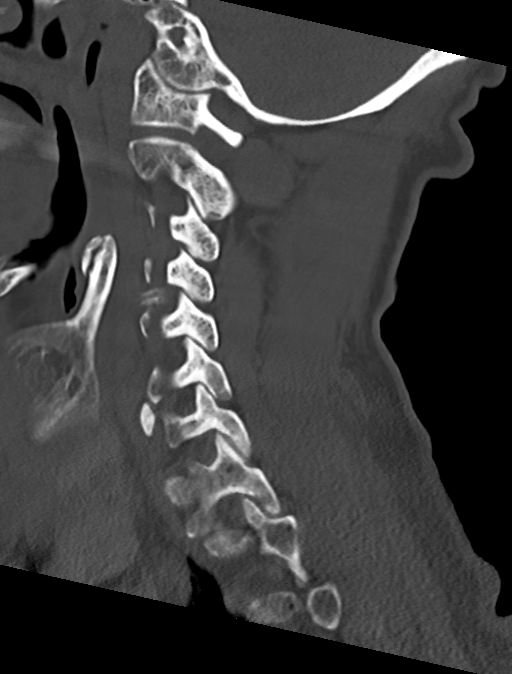
[im 35/69  soft-tissue]
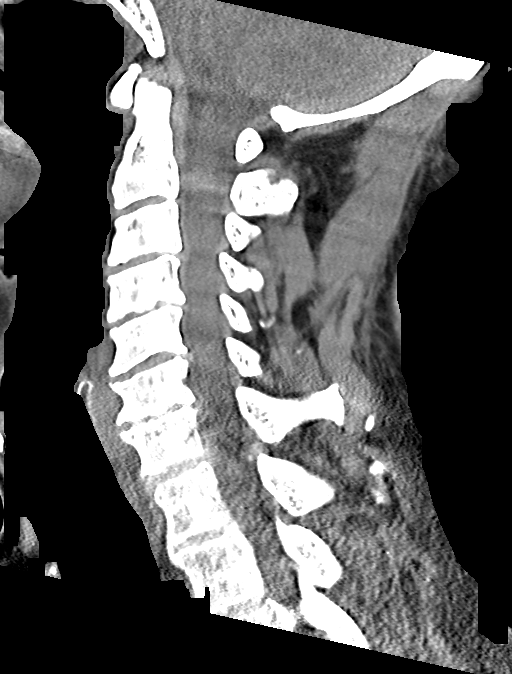
[im 35/69  bone]
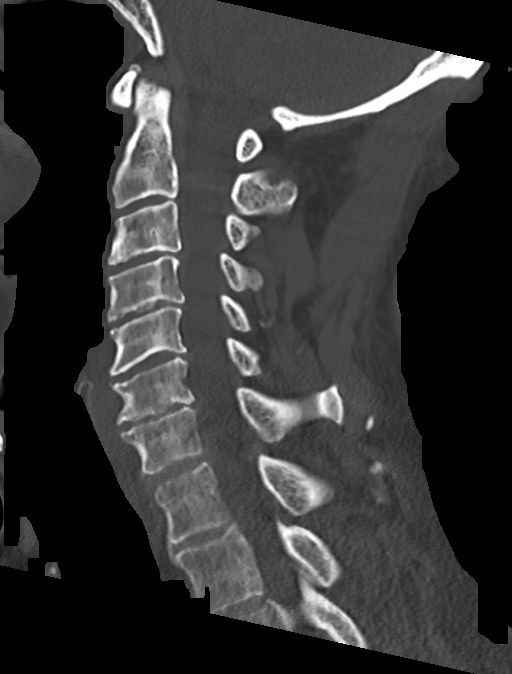
[im 40/69  bone]
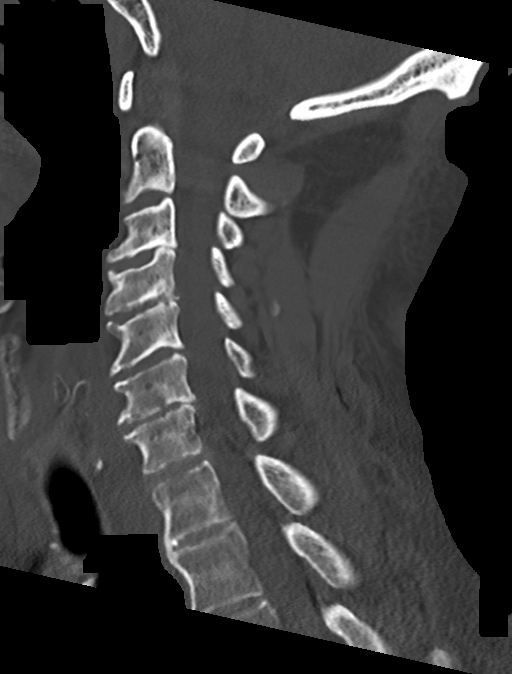
[im 46/69  bone]
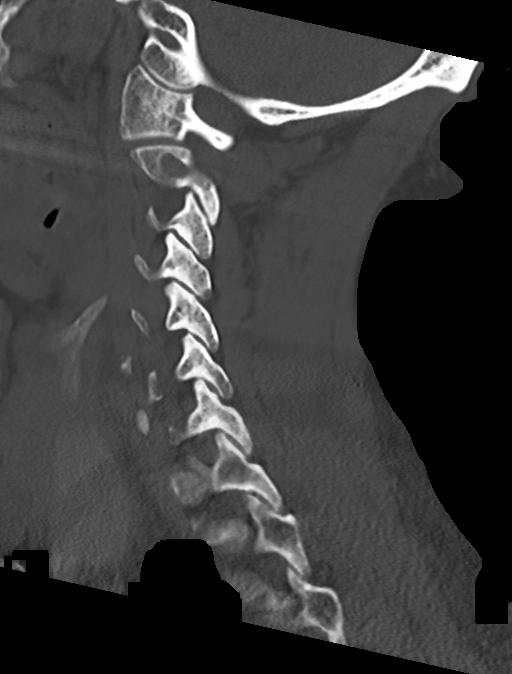

[Series 9: cor bone · coronal · 0.29mm/px · 3 of 65 slices shown]
[im 13/65  bone]
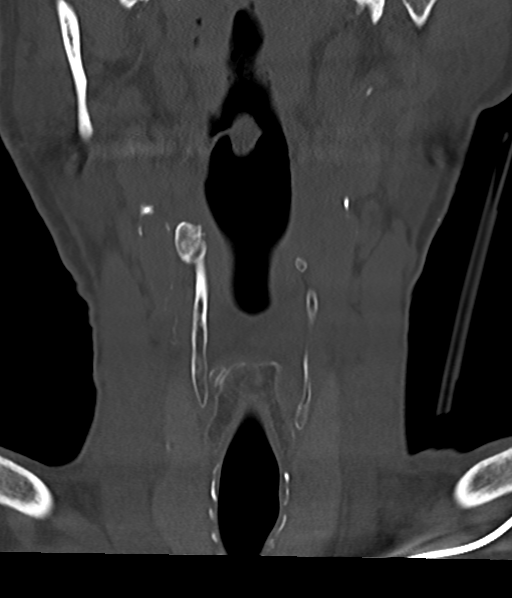
[im 26/65  bone]
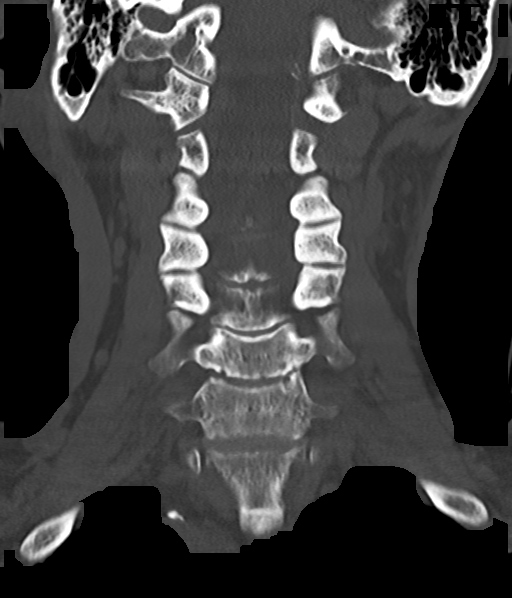
[im 39/65  bone]
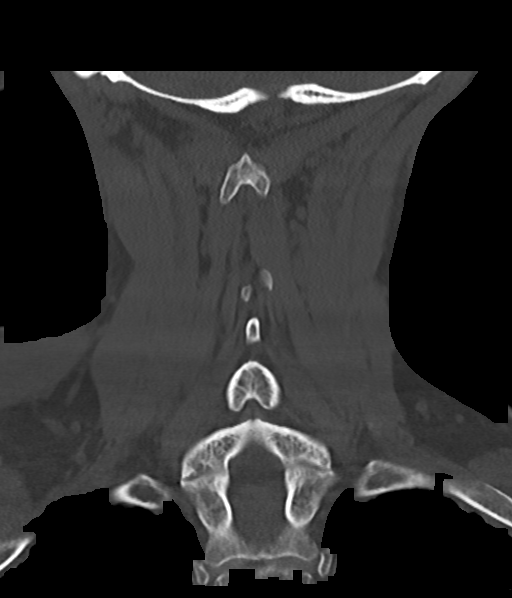

[Series 10: orthogonal axials · axial · 0.21mm/px · z∈[+940,+1054]mm · 5 of 87 slices shown, 7 images]
[im 15/87  soft-tissue]
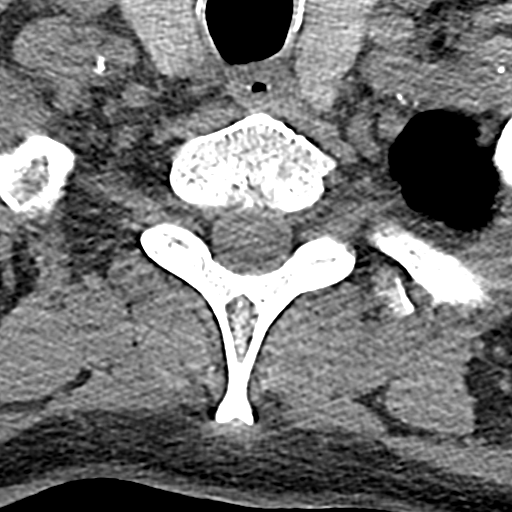
[im 15/87  bone]
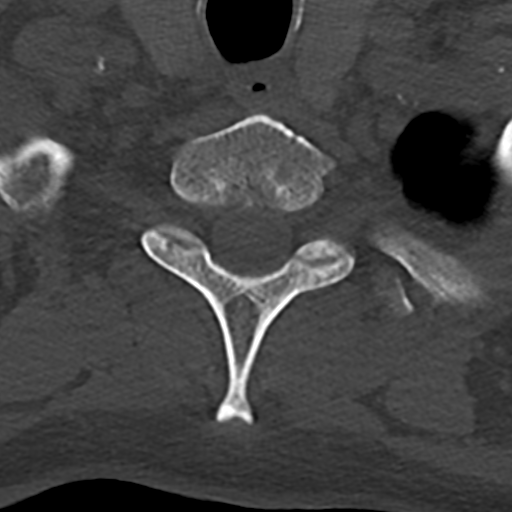
[im 29/87  bone]
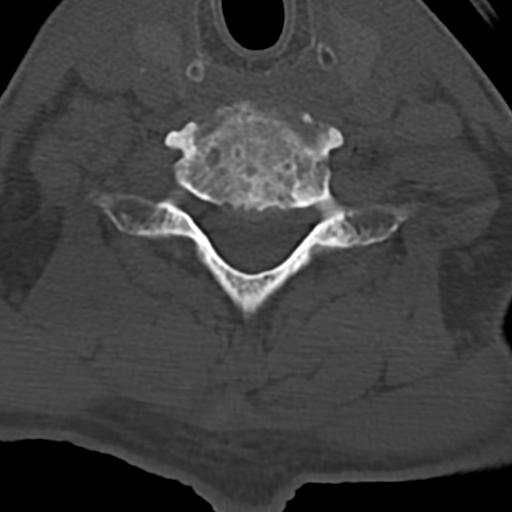
[im 44/87  bone]
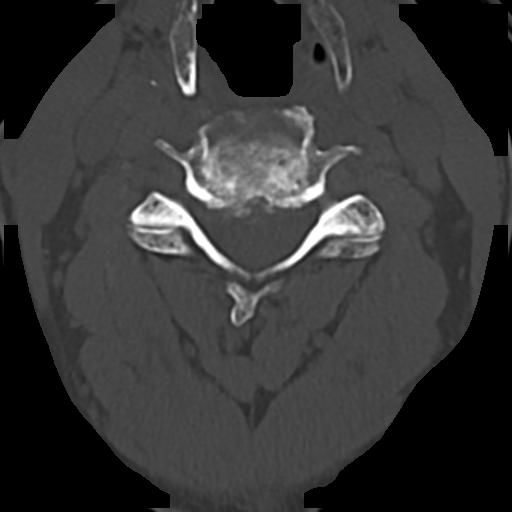
[im 58/87  bone]
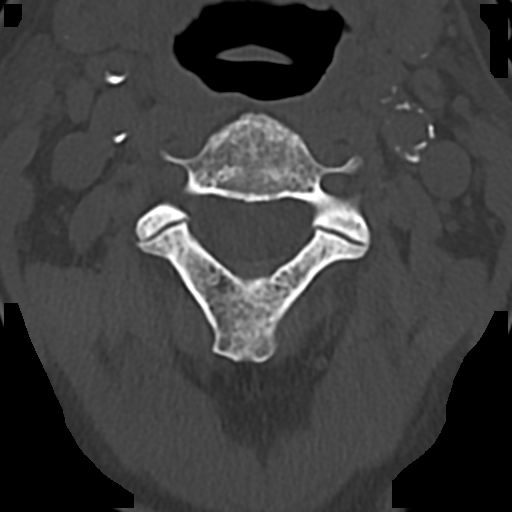
[im 72/87  soft-tissue]
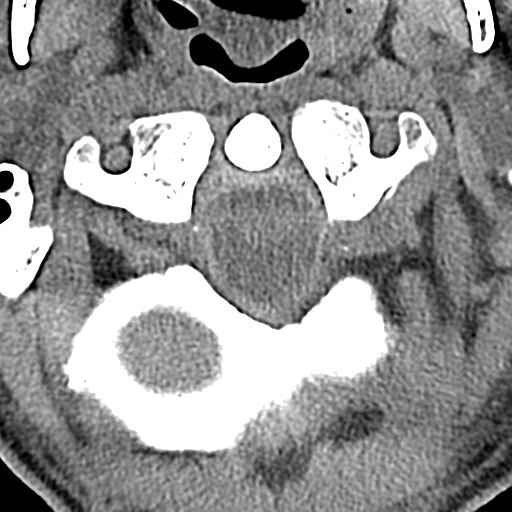
[im 72/87  bone]
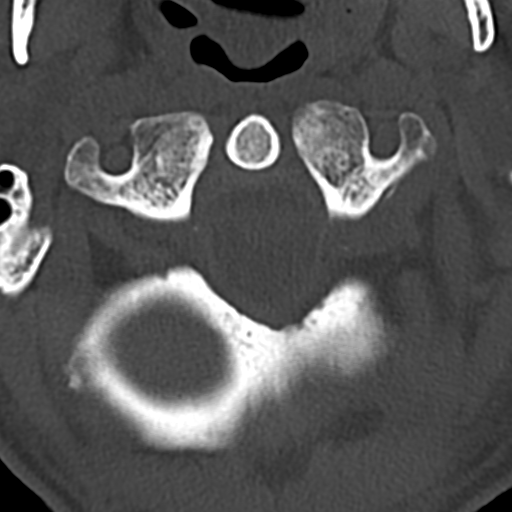

[13 of 33 positions shown; findings below may reference images not displayed]

FINDINGS: CT HEAD FINDINGS

Brain: No evidence of acute infarction, hemorrhage, hydrocephalus,
extra-axial collection or mass lesion/mass effect. Advanced
confluent periventricular with scattered subcortical and deep white
matter foci of hypoattenuation. Findings are nonspecific but most
consistent with chronic microvascular ischemic white matter disease.
Small lacunar infarct versus dilated perivascular space in the left
basal ganglia.

Vascular: No hyperdense vessel or unexpected calcification.

Skull: Normal. Negative for fracture or focal lesion.

Sinuses/Orbits: Chronic sinusitis in the right maxillary and right
ethmoid air cells.

Other: None.

CT CERVICAL SPINE FINDINGS

Alignment: Normal.

Skull base and vertebrae: No acute fracture. No primary bone lesion
or focal pathologic process.

Soft tissues and spinal canal: No prevertebral fluid or swelling. No
visible canal hematoma.

Disc levels:  Mild multilevel cervical spondylosis without focality.

Upper chest: Negative.

Other: None.
IMPRESSION: CT HEAD

1. No acute intracranial abnormality.
2. Advanced chronic microvascular ischemic white matter disease.

CT CSPINE

1. No acute fracture or malalignment.
2. Mild multilevel cervical spondylosis without focality.

## 2023-05-10 ENCOUNTER — Other Ambulatory Visit (HOSPITAL_COMMUNITY): Payer: Self-pay

## 2023-05-26 ENCOUNTER — Other Ambulatory Visit (HOSPITAL_COMMUNITY): Payer: Self-pay

## 2023-07-05 ENCOUNTER — Other Ambulatory Visit (HOSPITAL_COMMUNITY): Payer: Self-pay

## 2023-07-20 ENCOUNTER — Other Ambulatory Visit (HOSPITAL_COMMUNITY): Payer: Self-pay
